# Patient Record
Sex: Male | Born: 1940 | Race: White | Hispanic: No | Marital: Married | Smoking: Former smoker
Health system: Southern US, Community
[De-identification: ages and names within clinical notes are randomized; demographics above are authoritative.]

## PROBLEM LIST (undated history)

## (undated) DIAGNOSIS — R0600 Dyspnea, unspecified: Secondary | ICD-10-CM

## (undated) DIAGNOSIS — R310 Gross hematuria: Secondary | ICD-10-CM

## (undated) DIAGNOSIS — R0609 Other forms of dyspnea: Secondary | ICD-10-CM

## (undated) DIAGNOSIS — F32A Depression, unspecified: Secondary | ICD-10-CM

## (undated) DIAGNOSIS — K2210 Ulcer of esophagus without bleeding: Secondary | ICD-10-CM

## (undated) DIAGNOSIS — F419 Anxiety disorder, unspecified: Secondary | ICD-10-CM

## (undated) DIAGNOSIS — I499 Cardiac arrhythmia, unspecified: Secondary | ICD-10-CM

## (undated) DIAGNOSIS — E538 Deficiency of other specified B group vitamins: Secondary | ICD-10-CM

## (undated) DIAGNOSIS — Z972 Presence of dental prosthetic device (complete) (partial): Secondary | ICD-10-CM

## (undated) DIAGNOSIS — I4891 Unspecified atrial fibrillation: Secondary | ICD-10-CM

## (undated) DIAGNOSIS — C499 Malignant neoplasm of connective and soft tissue, unspecified: Secondary | ICD-10-CM

## (undated) DIAGNOSIS — N529 Male erectile dysfunction, unspecified: Secondary | ICD-10-CM

## (undated) DIAGNOSIS — I10 Essential (primary) hypertension: Secondary | ICD-10-CM

## (undated) DIAGNOSIS — I8500 Esophageal varices without bleeding: Secondary | ICD-10-CM

## (undated) DIAGNOSIS — G4730 Sleep apnea, unspecified: Secondary | ICD-10-CM

## (undated) DIAGNOSIS — M4800 Spinal stenosis, site unspecified: Secondary | ICD-10-CM

## (undated) DIAGNOSIS — I509 Heart failure, unspecified: Secondary | ICD-10-CM

## (undated) DIAGNOSIS — I429 Cardiomyopathy, unspecified: Secondary | ICD-10-CM

## (undated) DIAGNOSIS — F329 Major depressive disorder, single episode, unspecified: Secondary | ICD-10-CM

## (undated) DIAGNOSIS — I8393 Asymptomatic varicose veins of bilateral lower extremities: Secondary | ICD-10-CM

## (undated) DIAGNOSIS — E7800 Pure hypercholesterolemia, unspecified: Secondary | ICD-10-CM

## (undated) DIAGNOSIS — R972 Elevated prostate specific antigen [PSA]: Secondary | ICD-10-CM

## (undated) DIAGNOSIS — F1020 Alcohol dependence, uncomplicated: Secondary | ICD-10-CM

## (undated) DIAGNOSIS — K2980 Duodenitis without bleeding: Secondary | ICD-10-CM

## (undated) HISTORY — PX: TONSILLECTOMY: SUR1361

## (undated) HISTORY — PX: MOHS SURGERY: SUR867

## (undated) HISTORY — PX: TONSILLECTOMY AND ADENOIDECTOMY: SUR1326

## (undated) HISTORY — PX: OTHER SURGICAL HISTORY: SHX169

## (undated) SURGERY — VIDEO BRONCHOSCOPY WITH ENDOBRONCHIAL NAVIGATION
Anesthesia: General | Laterality: Left

---

## 2005-06-21 ENCOUNTER — Ambulatory Visit: Payer: Self-pay | Admitting: Internal Medicine

## 2005-11-09 ENCOUNTER — Ambulatory Visit: Payer: Self-pay | Admitting: Internal Medicine

## 2006-01-18 ENCOUNTER — Ambulatory Visit: Payer: Self-pay | Admitting: Otolaryngology

## 2007-03-14 DIAGNOSIS — L570 Actinic keratosis: Secondary | ICD-10-CM

## 2007-03-14 DIAGNOSIS — C4491 Basal cell carcinoma of skin, unspecified: Secondary | ICD-10-CM

## 2007-03-14 HISTORY — DX: Basal cell carcinoma of skin, unspecified: C44.91

## 2007-03-14 HISTORY — DX: Actinic keratosis: L57.0

## 2007-03-23 ENCOUNTER — Inpatient Hospital Stay: Payer: Self-pay | Admitting: Internal Medicine

## 2007-03-23 ENCOUNTER — Other Ambulatory Visit: Payer: Self-pay

## 2007-03-24 ENCOUNTER — Other Ambulatory Visit: Payer: Self-pay

## 2007-03-26 ENCOUNTER — Other Ambulatory Visit: Payer: Self-pay

## 2007-05-29 ENCOUNTER — Ambulatory Visit: Payer: Self-pay | Admitting: Gastroenterology

## 2007-05-30 ENCOUNTER — Observation Stay: Payer: Self-pay | Admitting: Internal Medicine

## 2007-05-30 ENCOUNTER — Other Ambulatory Visit: Payer: Self-pay

## 2007-06-06 ENCOUNTER — Emergency Department: Payer: Self-pay | Admitting: Emergency Medicine

## 2007-10-29 ENCOUNTER — Inpatient Hospital Stay: Payer: Self-pay | Admitting: Internal Medicine

## 2007-10-29 ENCOUNTER — Other Ambulatory Visit: Payer: Self-pay

## 2007-11-02 ENCOUNTER — Other Ambulatory Visit: Payer: Self-pay

## 2008-01-01 ENCOUNTER — Ambulatory Visit: Payer: Self-pay | Admitting: Gastroenterology

## 2008-03-28 ENCOUNTER — Emergency Department (HOSPITAL_COMMUNITY): Admission: EM | Admit: 2008-03-28 | Discharge: 2008-03-28 | Payer: Self-pay | Admitting: Emergency Medicine

## 2008-03-28 ENCOUNTER — Encounter: Admission: RE | Admit: 2008-03-28 | Discharge: 2008-03-28 | Payer: Self-pay | Admitting: Psychiatry

## 2008-04-07 ENCOUNTER — Encounter: Admission: RE | Admit: 2008-04-07 | Discharge: 2008-04-07 | Payer: Self-pay | Admitting: Psychiatry

## 2008-07-16 ENCOUNTER — Ambulatory Visit: Payer: Self-pay | Admitting: Otolaryngology

## 2008-08-15 ENCOUNTER — Ambulatory Visit: Payer: Self-pay | Admitting: Otolaryngology

## 2009-01-27 IMAGING — CR DG CHEST 1V PORT
1 series · 1 of 1 positions shown · non-contrast
Comparison: none

REASON FOR EXAM: on vent
COMMENTS:

[view not recorded]
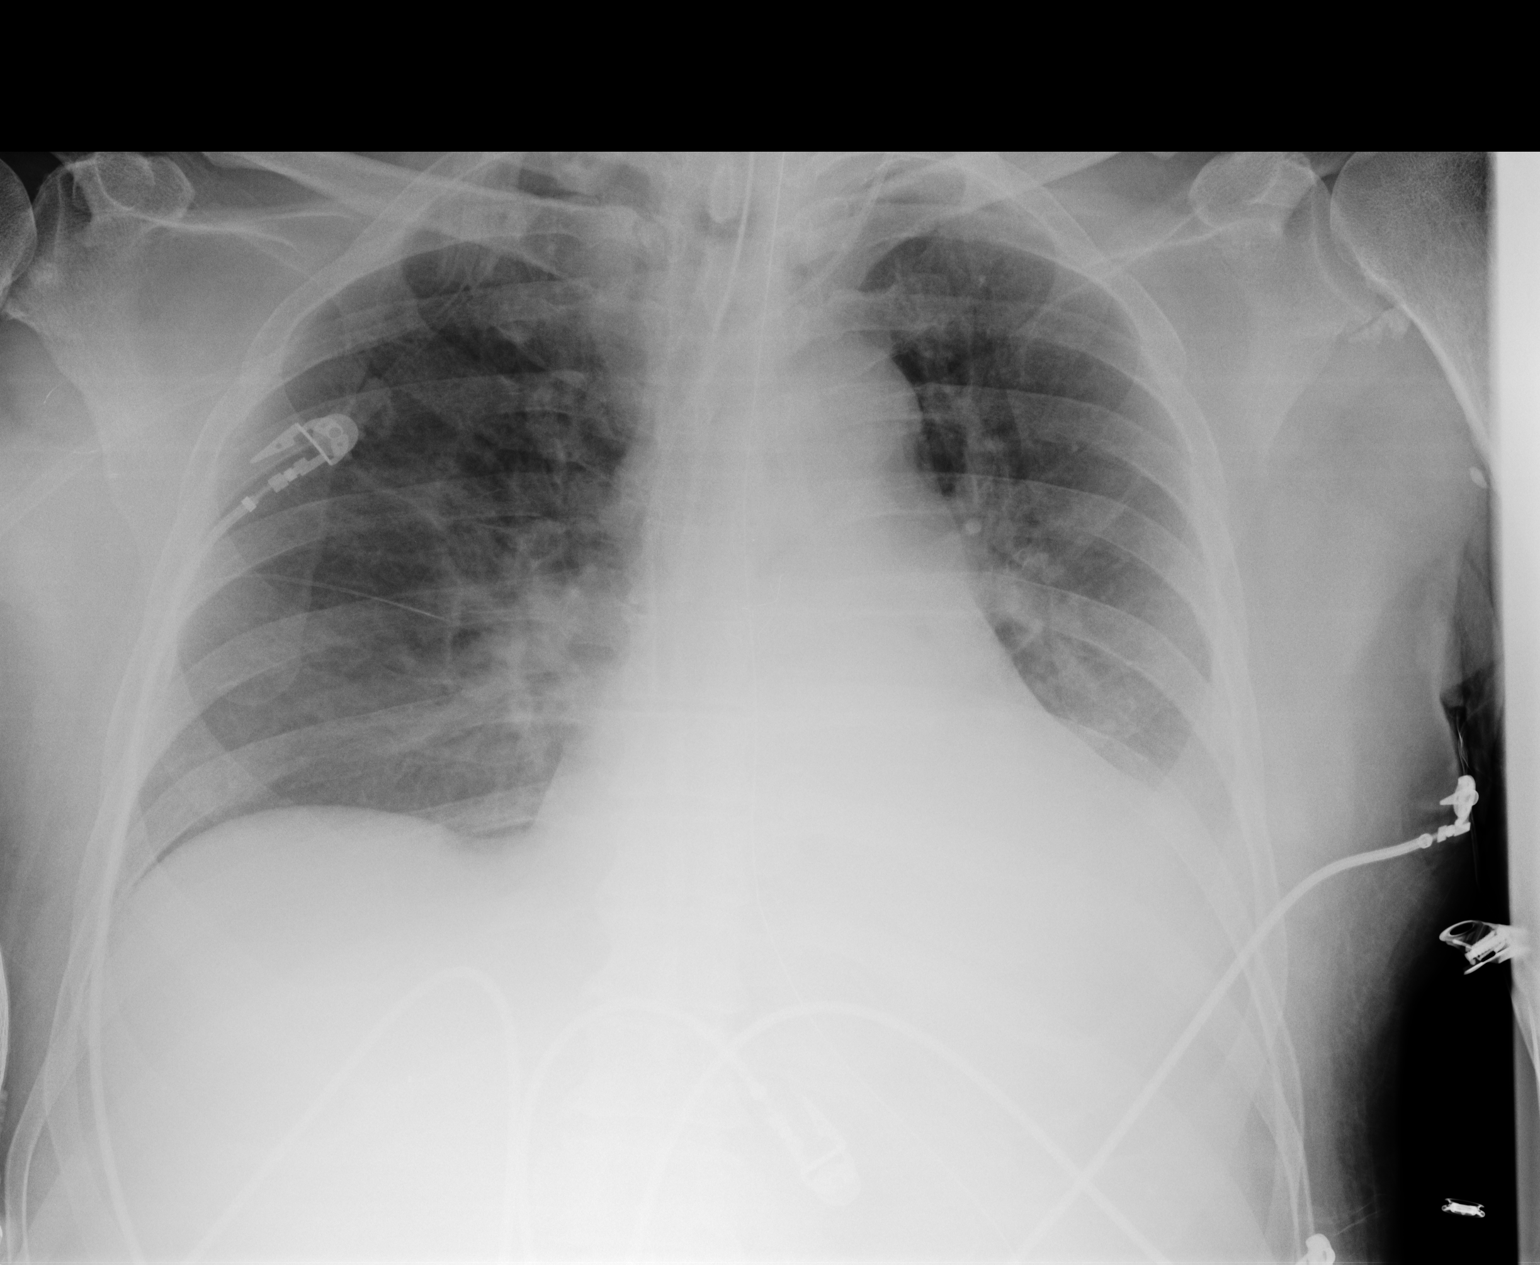

[1 of 1 positions shown; findings below may reference images not displayed]

PROCEDURE:     DXR - DXR PORTABLE CHEST SINGLE VIEW  - November 03, 2007  [DATE]

RESULT:     Comparison is made to a study 02 November, 2007.

The endotracheal tube tip lies at the level of the inferior margin of the
clavicular heads. The LEFT internal jugular venous catheter tip lies in the
region of a portion of the junction of the SVC with the RIGHT atrium. The
LEFT lower lobe region remains dense. The perihilar lung markings remain
prominent.
IMPRESSION: Allowing for the somewhat increased hypoinflation today, there has not been
significant interval change since the study [DATE].

## 2009-01-28 IMAGING — CR DG CHEST 1V PORT
1 series · 1 of 1 positions shown · non-contrast
Comparison: none

REASON FOR EXAM: on vent
COMMENTS:

[view not recorded]
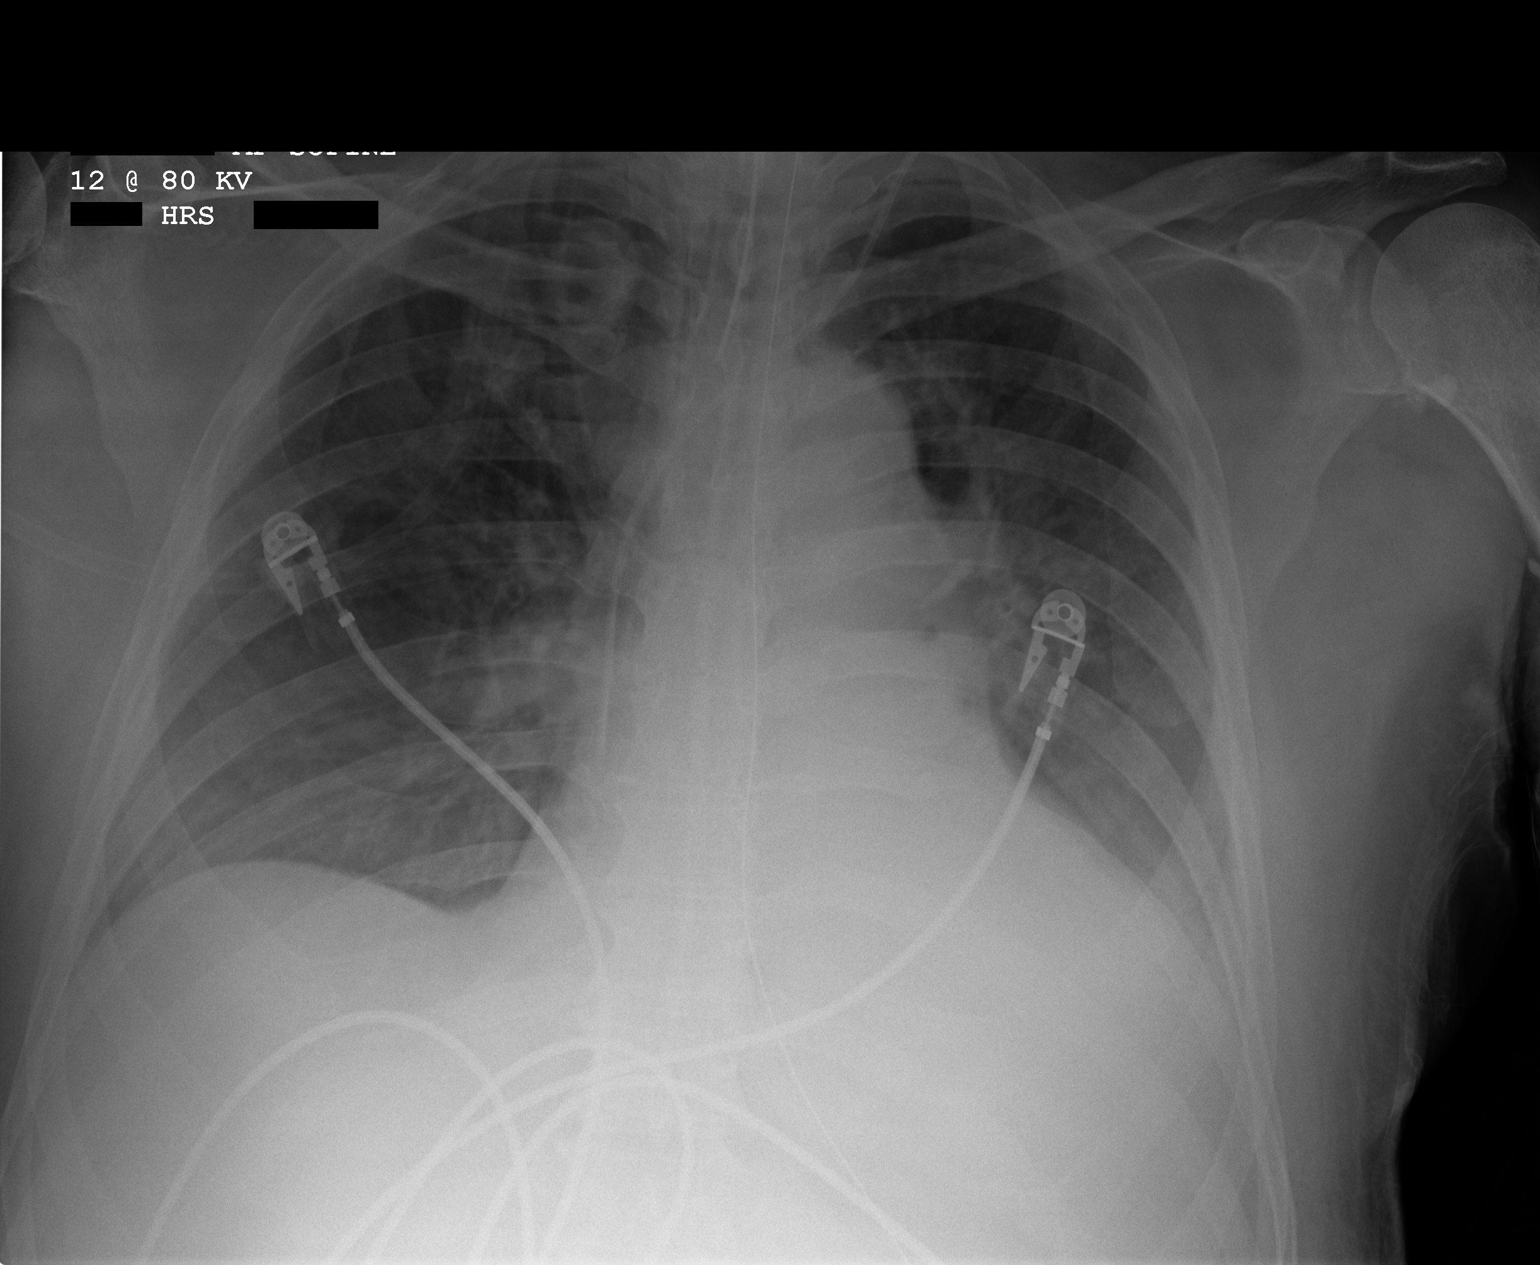

[1 of 1 positions shown; findings below may reference images not displayed]

PROCEDURE:     DXR - DXR PORTABLE CHEST SINGLE VIEW  - November 04, 2007  [DATE]

RESULT:     Comparison is made to a study 03 November, 2007.

The lungs are somewhat better inflated today. The endotracheal tube tip lies
at the level of the clavicular heads. The LEFT internal jugular venous
catheter tip lies in the region of the junction of the SVC with the RIGHT
atrium. The esophagogastric tube tip projects off the film.

The LEFT hemidiaphragm remains obscured. The cardiac silhouette remains top
normal in size. The perihilar lung markings are prominent.
IMPRESSION: There remain findings of LEFT lower lobe atelectasis. There may be an
element of low grade interstitial edema as well. Overall, there has not been
dramatic change since yesterday's study.

## 2009-01-31 IMAGING — CR DG CHEST 1V PORT
1 series · 1 of 1 positions shown · non-contrast
Comparison: none

REASON FOR EXAM: RIGHT INFILTRATE
COMMENTS:

PROCEDURE:     DXR - DXR PORTABLE CHEST SINGLE VIEW  - November 07, 2007  [DATE]
RESULT:     Comparison: 11/05/2007

[view not recorded]
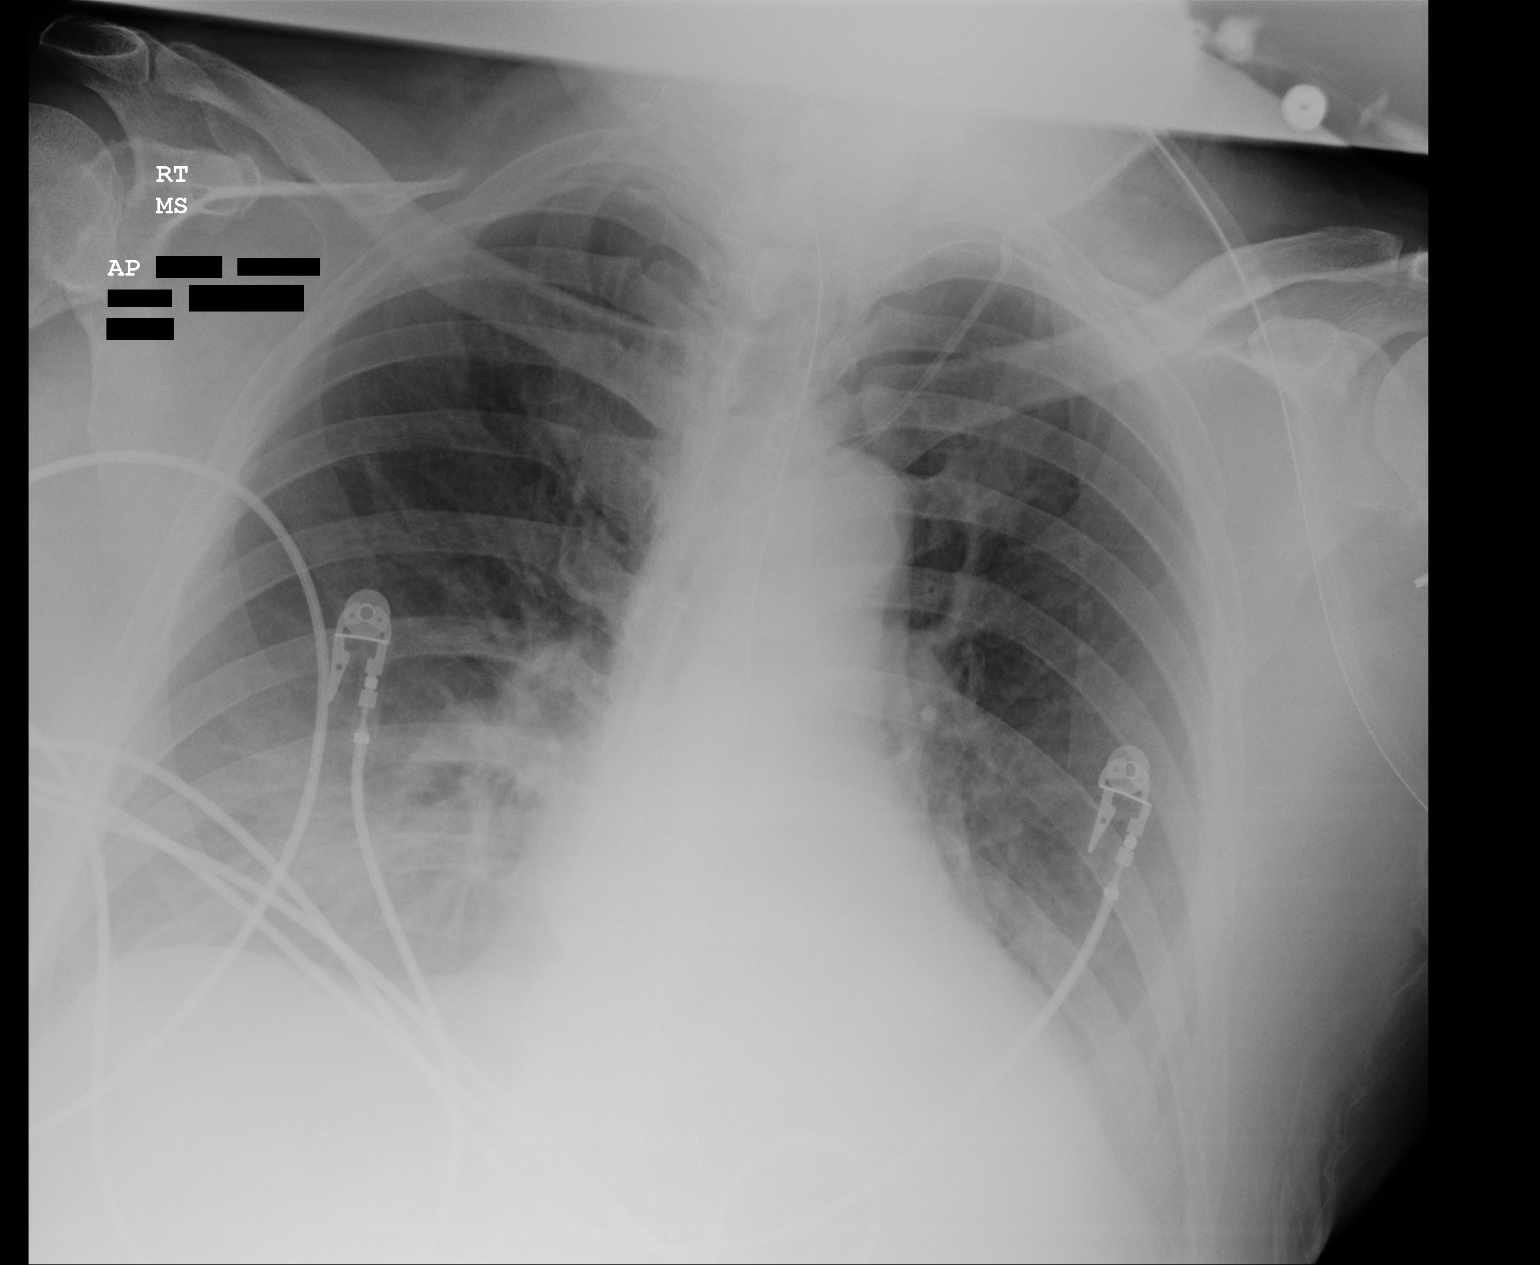

[1 of 1 positions shown; findings below may reference images not displayed]

FINDINGS: Single AP view of the chest is provided. There has been interval extubation.
Again demonstrated is a nasogastric tube with the tip not visualized. There
is a left sided central venous catheter with the tip projecting over the
SVC.

There is no focal consolidation or pneumothorax. There is mild haziness of
the lower lung fields relative to the remainder of the lung which may
reflect a small amount of pleural fluid. The heart and mediastinum are
unremarkable. The osseous structures are unremarkable.
IMPRESSION: No significant interval change compared to prior exam.

## 2009-02-03 IMAGING — CR DG CHEST 1V PORT
1 series · 1 of 1 positions shown · non-contrast
Comparison: none

REASON FOR EXAM: respiratory failure
COMMENTS:

PROCEDURE:     DXR - DXR PORTABLE CHEST SINGLE VIEW  - November 10, 2007  [DATE]
RESULT:     A central venous line is noted with its tip projected over the
superior vena cava, upper RIGHT atrium.  NG tube is noted in good anatomic
position. The lungs are clear. There is cardiomegaly.

[view not recorded]
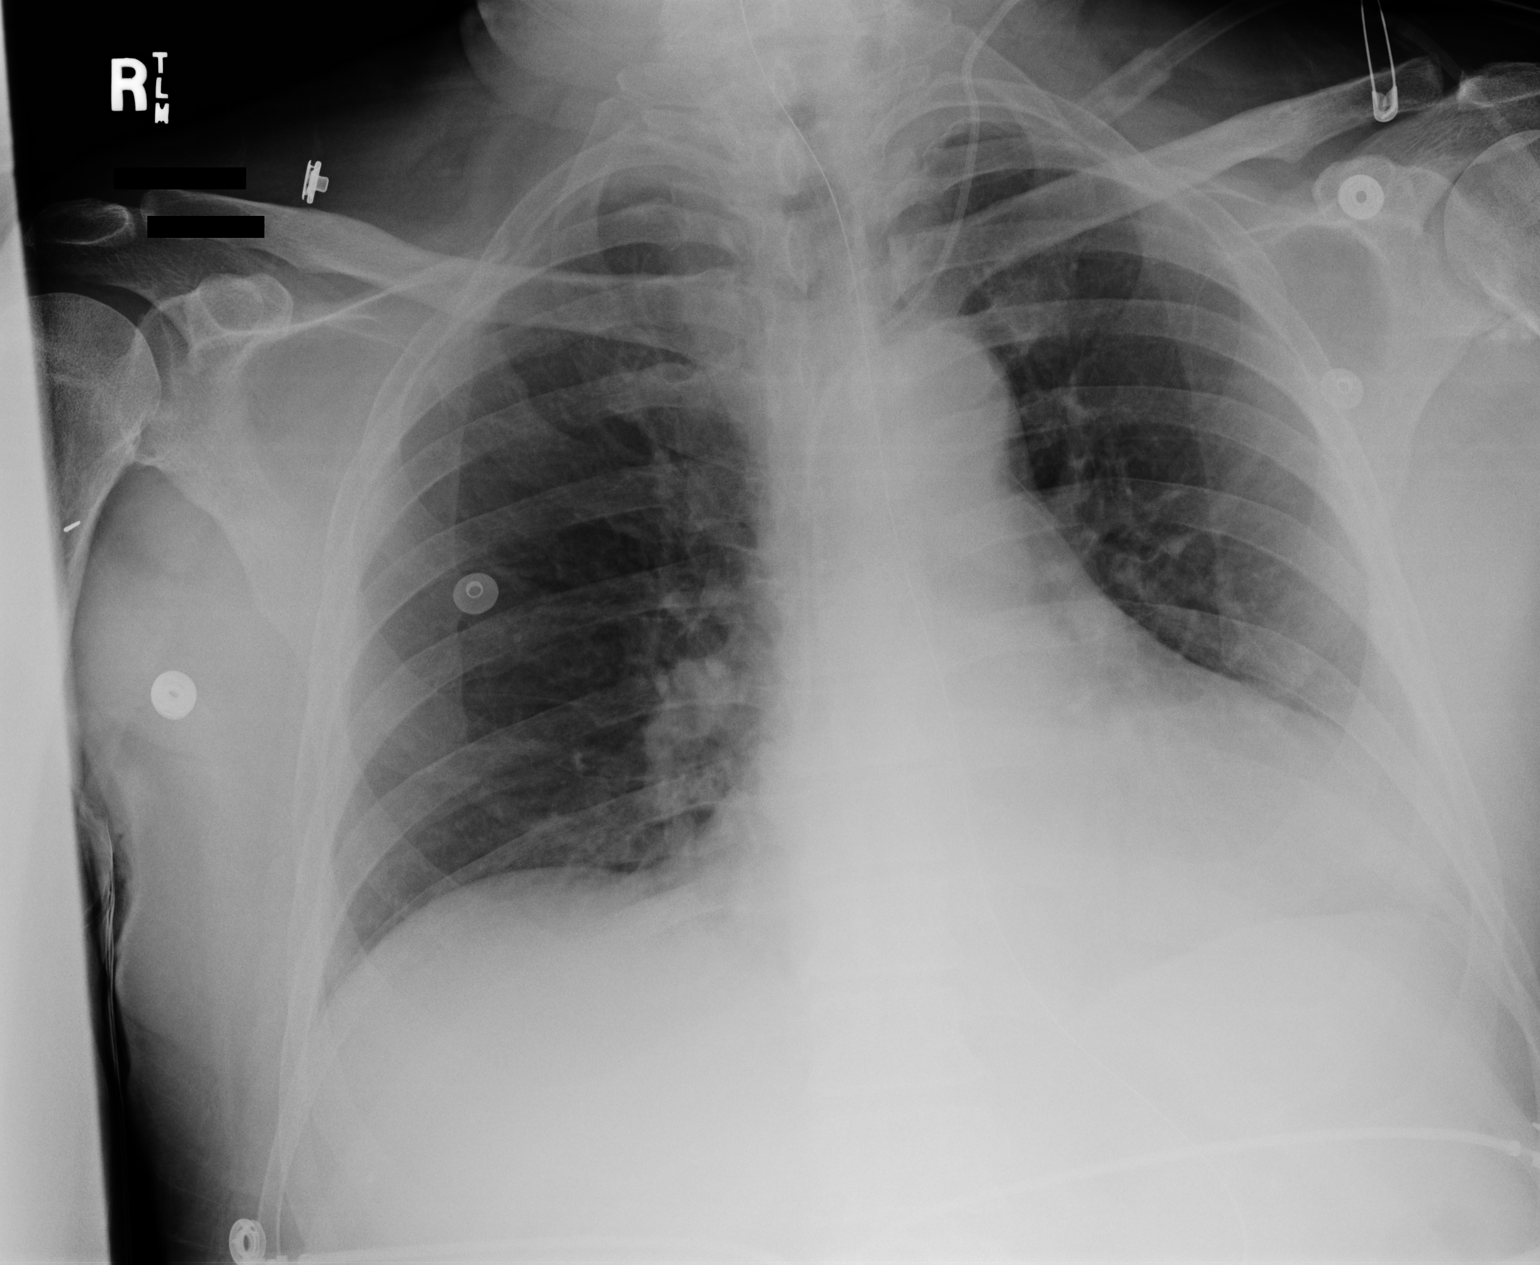

[1 of 1 positions shown; findings below may reference images not displayed]

IMPRESSION: 1.No acute abnormalities are identified. Tube positionings are stable. No
evidence of focal infiltrate or pulmonary edema noted on today's exam.

## 2009-02-04 IMAGING — CR DG CHEST 1V PORT
1 series · 1 of 1 positions shown · non-contrast
Comparison: none

REASON FOR EXAM: RESPIRATORY FAILURE
COMMENTS:

[view not recorded]
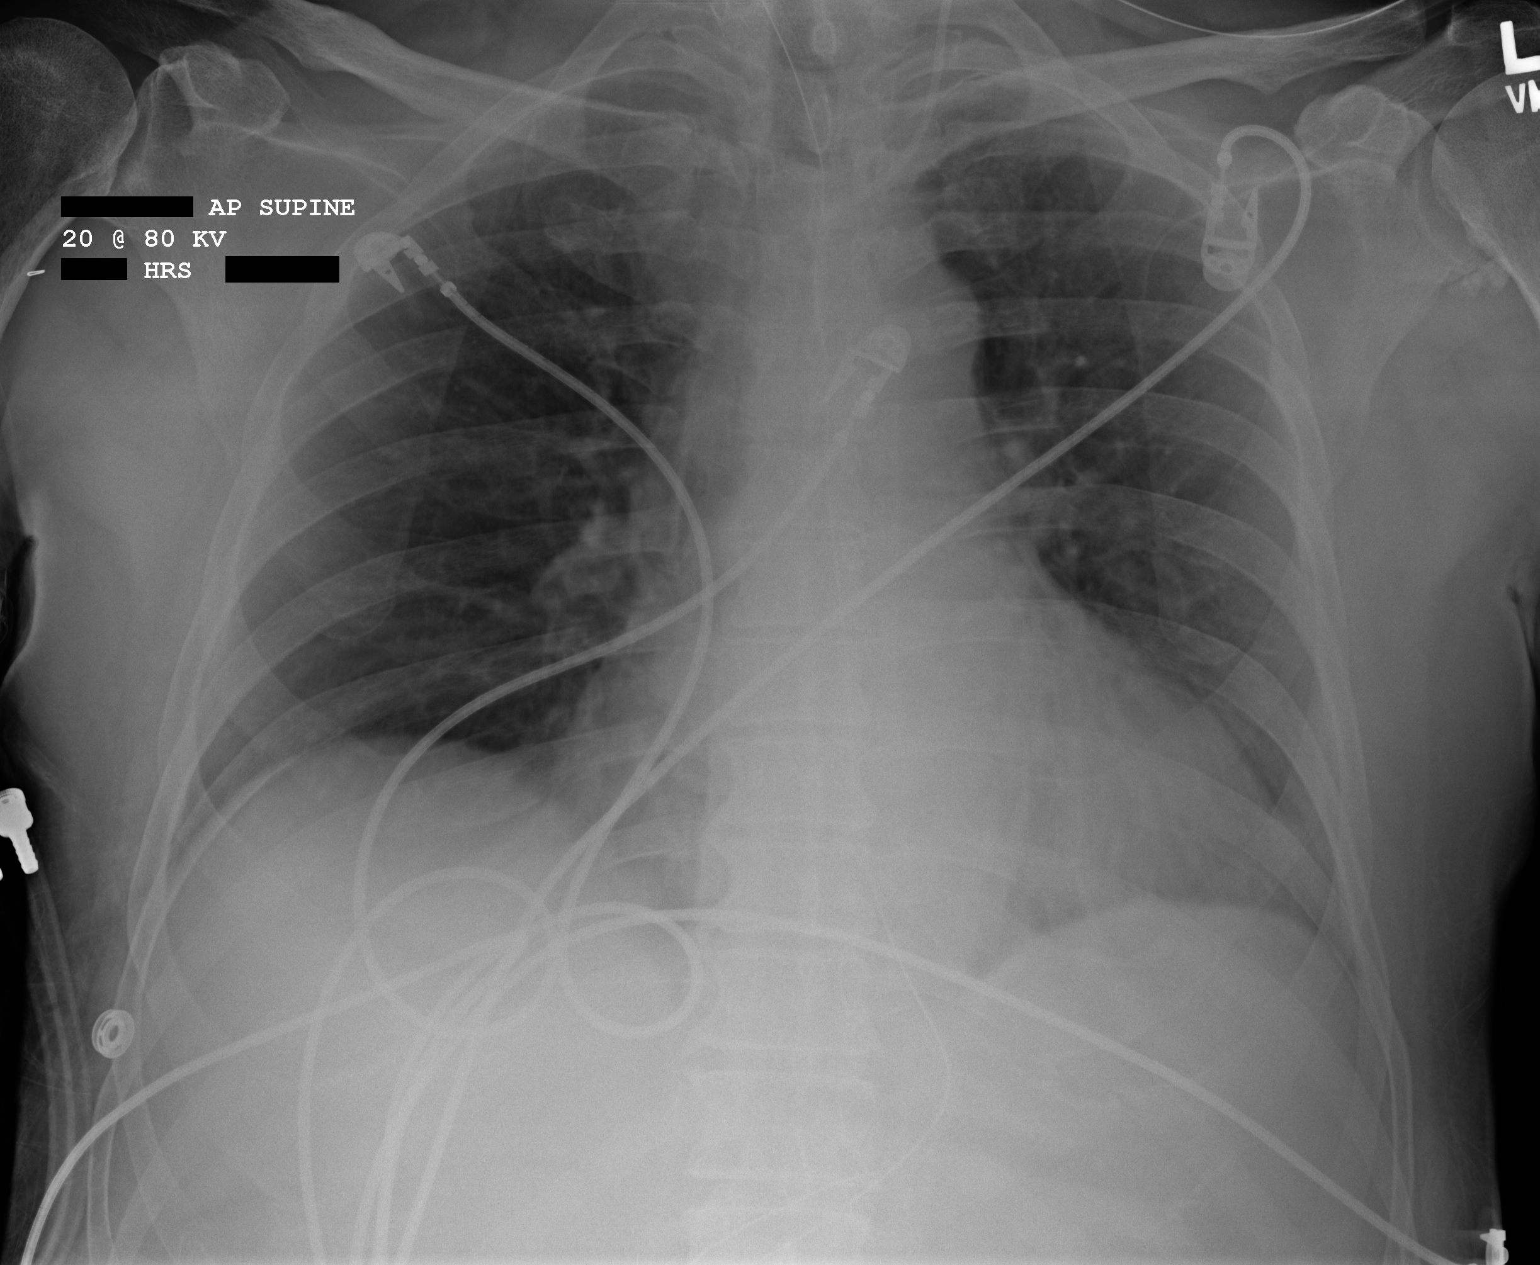

[1 of 1 positions shown; findings below may reference images not displayed]

PROCEDURE:     DXR - DXR PORTABLE CHEST SINGLE VIEW  - November 11, 2007  [DATE]

RESULT:     A central venous line via a LEFT jugular approach is noted with
its tip in the region of the superior vena cava. An NG tube is noted with
its tip coiled in the stomach. The lungs are clear of acute infiltrates. The
cardiovascular structures are stable.
IMPRESSION: 1.No acute cardiopulmonary disease. NG tube and central venous line in good
anatomic position.

## 2010-06-08 LAB — BASIC METABOLIC PANEL
BUN: 21 mg/dL (ref 6–23)
CO2: 26 mEq/L (ref 19–32)
Calcium: 9.6 mg/dL (ref 8.4–10.5)
Creatinine, Ser: 0.99 mg/dL (ref 0.4–1.5)
GFR calc non Af Amer: >60 mL/min (ref >60)
Glucose, Bld: 123 mg/dL — ABNORMAL HIGH (ref 70–99)
Sodium: 137 mEq/L (ref 135–145)

## 2011-03-22 ENCOUNTER — Ambulatory Visit: Payer: Self-pay | Admitting: Sports Medicine

## 2012-01-25 ENCOUNTER — Ambulatory Visit: Payer: Self-pay | Admitting: Gastroenterology

## 2012-02-22 DIAGNOSIS — Z85828 Personal history of other malignant neoplasm of skin: Secondary | ICD-10-CM

## 2012-02-22 HISTORY — DX: Personal history of other malignant neoplasm of skin: Z85.828

## 2012-07-05 DIAGNOSIS — C4492 Squamous cell carcinoma of skin, unspecified: Secondary | ICD-10-CM

## 2012-07-05 HISTORY — DX: Squamous cell carcinoma of skin, unspecified: C44.92

## 2014-01-08 DIAGNOSIS — D239 Other benign neoplasm of skin, unspecified: Secondary | ICD-10-CM

## 2014-01-08 HISTORY — DX: Other benign neoplasm of skin, unspecified: D23.9

## 2015-01-20 DIAGNOSIS — Z86006 Personal history of melanoma in-situ: Secondary | ICD-10-CM

## 2015-01-20 HISTORY — DX: Personal history of melanoma in-situ: Z86.006

## 2016-12-22 HISTORY — PX: OTHER SURGICAL HISTORY: SHX169

## 2016-12-27 ENCOUNTER — Ambulatory Visit: Payer: Medicare Other | Admitting: Anesthesiology

## 2016-12-27 ENCOUNTER — Ambulatory Visit
Admission: RE | Admit: 2016-12-27 | Discharge: 2016-12-27 | Disposition: A | Discharge status: Home / Self Care | Payer: Medicare Other | Source: Ambulatory Visit | Attending: Internal Medicine | Admitting: Internal Medicine

## 2016-12-27 ENCOUNTER — Encounter
Admission: RE | Disposition: A | Discharge status: Home / Self Care | Payer: Self-pay | Source: Ambulatory Visit | Attending: Internal Medicine

## 2016-12-27 DIAGNOSIS — Z79899 Other long term (current) drug therapy: Secondary | ICD-10-CM | POA: Insufficient documentation

## 2016-12-27 DIAGNOSIS — K640 First degree hemorrhoids: Secondary | ICD-10-CM | POA: Diagnosis not present

## 2016-12-27 DIAGNOSIS — Z1211 Encounter for screening for malignant neoplasm of colon: Secondary | ICD-10-CM | POA: Diagnosis present

## 2016-12-27 DIAGNOSIS — Z91040 Latex allergy status: Secondary | ICD-10-CM | POA: Diagnosis not present

## 2016-12-27 DIAGNOSIS — Z7982 Long term (current) use of aspirin: Secondary | ICD-10-CM | POA: Insufficient documentation

## 2016-12-27 DIAGNOSIS — Z8371 Family history of colonic polyps: Secondary | ICD-10-CM | POA: Diagnosis not present

## 2016-12-27 HISTORY — PX: COLONOSCOPY WITH PROPOFOL: SHX5780

## 2016-12-27 SURGERY — COLONOSCOPY WITH PROPOFOL
Anesthesia: General

## 2016-12-27 MED ORDER — LIDOCAINE 2% (20 MG/ML) 5 ML SYRINGE
INTRAMUSCULAR | Status: DC | PRN
  Administered 2016-12-27: 40 mg via INTRAVENOUS

## 2016-12-27 MED ORDER — PROPOFOL 10 MG/ML IV BOLUS
INTRAVENOUS | Status: AC
  Filled 2016-12-27: qty 20

## 2016-12-27 MED ORDER — PROPOFOL 10 MG/ML IV BOLUS
INTRAVENOUS | Status: DC | PRN
  Administered 2016-12-27: 100 mg via INTRAVENOUS

## 2016-12-27 MED ORDER — FENTANYL CITRATE (PF) 100 MCG/2ML IJ SOLN
INTRAMUSCULAR | Status: AC
  Filled 2016-12-27: qty 2

## 2016-12-27 MED ORDER — PROPOFOL 500 MG/50ML IV EMUL
INTRAVENOUS | Status: DC | PRN
  Administered 2016-12-27: 140 ug/kg/min via INTRAVENOUS

## 2016-12-27 MED ORDER — SODIUM CHLORIDE 0.9 % IV SOLN
INTRAVENOUS | Status: DC
  Administered 2016-12-27 (×2): via INTRAVENOUS

## 2016-12-27 MED ORDER — PROPOFOL 500 MG/50ML IV EMUL
INTRAVENOUS | Status: AC
  Filled 2016-12-27: qty 50

## 2016-12-27 MED ORDER — PHENYLEPHRINE HCL 10 MG/ML IJ SOLN
INTRAMUSCULAR | Status: DC | PRN
  Administered 2016-12-27 (×2): 100 ug via INTRAVENOUS

## 2016-12-27 MED ORDER — FENTANYL CITRATE (PF) 100 MCG/2ML IJ SOLN
INTRAMUSCULAR | Status: DC | PRN
  Administered 2016-12-27 (×2): 50 ug via INTRAVENOUS

## 2016-12-27 NOTE — Anesthesia Postprocedure Evaluation (Signed)
Anesthesia Post Note  Patient: Wesley Young  Procedure(s) Performed: COLONOSCOPY WITH PROPOFOL (N/A )  Patient location during evaluation: Endoscopy Anesthesia Type: General Level of consciousness: awake and alert Pain management: pain level controlled Vital Signs Assessment: post-procedure vital signs reviewed and stable Respiratory status: spontaneous breathing, nonlabored ventilation, respiratory function stable and patient connected to nasal cannula oxygen Cardiovascular status: blood pressure returned to baseline and stable Postop Assessment: no apparent nausea or vomiting Anesthetic complications: no     Last Vitals:  Vitals:   12/27/16 1609 12/27/16 1629  BP: (!) 84/48 118/76  Pulse: 60 72  Resp: 15 18  Temp:    SpO2: 99% 99%    Last Pain:  Vitals:   12/27/16 1445  TempSrc: Tympanic                 Martha Clan

## 2016-12-27 NOTE — Transfer of Care (Signed)
Immediate Anesthesia Transfer of Care Note  Patient: Wesley Young  Procedure(s) Performed: COLONOSCOPY WITH PROPOFOL (N/A )  Patient Location: PACU and Endoscopy Unit  Anesthesia Type:General  Level of Consciousness: sedated  Airway & Oxygen Therapy: Patient Spontanous Breathing and Patient connected to nasal cannula oxygen  Post-op Assessment: Report given to RN and Post -op Vital signs reviewed and stable  Post vital signs: Reviewed and stable  Last Vitals:  Vitals:   12/27/16 1445  BP: (!) 160/91  Pulse: 77  Resp: 18  Temp: 36.8 C  SpO2: 100%    Last Pain:  Vitals:   12/27/16 1445  TempSrc: Tympanic         Complications: No apparent anesthesia complications

## 2016-12-27 NOTE — Anesthesia Post-op Follow-up Note (Signed)
Anesthesia QCDR form completed.        

## 2016-12-27 NOTE — Interval H&P Note (Signed)
History and Physical Interval Note:  12/27/2016 3:27 PM  Wesley Young  has presented today for surgery, with the diagnosis of FM HX POLYPS IN COLON  The various methods of treatment have been discussed with the patient and family. After consideration of risks, benefits and other options for treatment, the patient has consented to  Procedure(s): COLONOSCOPY WITH PROPOFOL (N/A) as a surgical intervention .  The patient's history has been reviewed, patient examined, no change in status, stable for surgery.  I have reviewed the patient's chart and labs.  Questions were answered to the patient's satisfaction.     [place], [place]

## 2016-12-27 NOTE — Op Note (Signed)
[facility] Gastroenterology Patient Name: Wesley Young Procedure Date: 12/27/2016 3:28 PM MRN: [id number] Account #: [id number] Date of Birth: [birth year] Admit Type: Outpatient Age: 76 Room: [facility] ENDO ROOM 1 Gender: Male Note Status: Finalized Procedure:            Colonoscopy Indications:          Colon cancer screening in patient at increased risk:                        Family history of 1st-degree relative with colon polyps Providers:            Benay Pike. [name] MD, MD Referring MD:         Ocie Cornfield. Ouida Sills MD, MD (Referring MD) Medicines:            Propofol per Anesthesia Complications:        No immediate complications. Procedure:            Pre-Anesthesia Assessment:                       - The risks and benefits of the procedure and the                        sedation options and risks were discussed with the                        patient. All questions were answered and informed                        consent was obtained.                       - Patient identification and proposed procedure were                        verified prior to the procedure by the nurse. The                        procedure was verified in the procedure room.                       - ASA Grade Assessment: III - A patient with severe                        systemic disease.                       - After reviewing the risks and benefits, the patient                        was deemed in satisfactory condition to undergo the                        procedure.                       After obtaining informed consent, the colonoscope was                        passed under direct vision. Throughout the procedure,  the patient's blood pressure, pulse, and oxygen                        saturations were monitored continuously. The                        Colonoscope was introduced through the anus and                        advanced to the the cecum, identified  by appendiceal                        orifice and ileocecal valve. The colonoscopy was                        performed without difficulty. The patient tolerated the                        procedure well. The quality of the bowel preparation                        was fair. The ileocecal valve, appendiceal orifice, and                        rectum were photographed. Findings:      The perianal and digital rectal examinations were normal. Pertinent       negatives include normal sphincter tone and no palpable rectal lesions.      Non-bleeding internal hemorrhoids were found during retroflexion. The       hemorrhoids were Grade I (internal hemorrhoids that do not prolapse).      The exam was otherwise without abnormality. Impression:           - Preparation of the colon was fair.                       - Non-bleeding internal hemorrhoids.                       - The examination was otherwise normal.                       - No specimens collected. Recommendation:       - Patient has a contact number available for                        emergencies. The signs and symptoms of potential                        delayed complications were discussed with the patient.                        Return to normal activities tomorrow. Written discharge                        instructions were provided to the patient.                       - Resume previous diet.                       - Continue present medications.                       -  Miralax 1 capful (17 grams) in 8 ounces of water PO                        daily daily.                       - No repeat colonoscopy due to age                       - The findings and recommendations were discussed with                        the patient and their spouse. Procedure Code(s):    --- Professional ---                       Z1696, Colorectal cancer screening; colonoscopy on                        individual at high risk Diagnosis Code(s):    ---  Professional ---                       Z83.71, Family history of colonic polyps                       K64.0, First degree hemorrhoids CPT copyright 2016 American Medical Association. All rights reserved. The codes documented in this report are preliminary and upon coder review may  be revised to meet current compliance requirements. Efrain Sella MD, MD 12/27/2016 3:55:48 PM This report has been signed electronically. Number of Addenda: 0 Note Initiated On: 12/27/2016 3:28 PM Scope Withdrawal Time: 0 hours 9 minutes 49 seconds  Total Procedure Duration: 0 hours 18 minutes 11 seconds       [facility]

## 2016-12-27 NOTE — Anesthesia Preprocedure Evaluation (Signed)
Anesthesia Evaluation  Patient identified by MRN, date of birth, ID band Patient awake    Reviewed: Allergy & Precautions, NPO status , Patient's Chart, lab work & pertinent test results  Airway Mallampati: II       Dental  (+) Teeth Intact   Pulmonary neg pulmonary ROS,    breath sounds clear to auscultation       Cardiovascular Exercise Tolerance: Good  Rhythm:Regular Rate:Normal     Neuro/Psych    GI/Hepatic negative GI ROS, Neg liver ROS,   Endo/Other  negative endocrine ROS  Renal/GU negative Renal ROS     Musculoskeletal   Abdominal Normal abdominal exam  (+)   Peds negative pediatric ROS (+)  Hematology negative hematology ROS (+)   Anesthesia Other Findings   Reproductive/Obstetrics                             Anesthesia Physical Anesthesia Plan  ASA: II  Anesthesia Plan: General   Post-op Pain Management:    Induction:   PONV Risk Score and Plan: 2  Airway Management Planned: Natural Airway and Nasal Cannula  Additional Equipment:   Intra-op Plan:   Post-operative Plan:   Informed Consent: I have reviewed the patients History and Physical, chart, labs and discussed the procedure including the risks, benefits and alternatives for the proposed anesthesia with the patient or authorized representative who has indicated his/her understanding and acceptance.     Plan Discussed with: CRNA  Anesthesia Plan Comments:         Anesthesia Quick Evaluation

## 2016-12-27 NOTE — H&P (Signed)
Outpatient short stay form Pre-procedure 12/27/2016 3:25 PM Wesley Young K. [name], M.D.  Primary Physician: Dr. Frazier Richards  Reason for visit:  Family hx of colon polyps, altered bowel habits.  History of present illness:  76 y/o male presents for FH colon polyps. Also has hx of altered bowel habits controlled with one half capful of Miralax daily. Denies weight loss, rectal bleeding.    Current Facility-Administered Medications:  .  0.9 %  sodium chloride infusion, , Intravenous, Continuous, [place], Benay Pike, MD, Last Rate: 20 mL/hr at 12/27/16 1520  Medications Prior to Admission  Medication Sig Dispense Refill Last Dose  . aspirin EC 81 MG tablet Take 81 mg daily by mouth.   12/26/2016 at Unknown time  . enalapril (VASOTEC) 5 MG tablet Take 5 mg daily by mouth.   12/26/2016 at Unknown time  . finasteride (PROSCAR) 5 MG tablet Take 5 mg daily by mouth.   12/26/2016 at Unknown time  . pramipexole (MIRAPEX) 0.5 MG tablet Take 0.5 mg 3 (three) times daily by mouth.   12/26/2016 at Unknown time  . Probiotic Product (ALIGN PO) Take daily by mouth.   12/26/2016 at Unknown time  . traMADol (ULTRAM) 50 MG tablet Take 50 mg every 6 (six) hours as needed by mouth.   12/27/2016 at Unknown time     Allergies  Allergen Reactions  . Statins Other (See Comments)  . Latex Rash  . Neosporin [Neomycin-Bacitracin Zn-Polymyx] Rash     No past medical history on file.  Review of systems:      Physical Exam  General appearance: alert, cooperative and appears stated age Resp: clear to auscultation bilaterally Cardio: regular rate and rhythm, S1, S2 normal, no murmur, click, rub or gallop GI: soft, non-tender; bowel sounds normal; no masses,  no organomegaly Extremities: extremities normal, atraumatic, no cyanosis or edema     Planned procedures: Colonoscopy. The patient understands the nature of the planned procedure, indications, risks, alternatives and potential complications including but  not limited to bleeding, infection, perforation, damage to internal organs and possible oversedation/side effects from anesthesia. The patient agrees and gives consent to proceed.  Please refer to procedure notes for findings, recommendations and patient disposition/instructions.    Wesley Young K. [name], M.D. Gastroenterology 12/27/2016  3:25 PM

## 2016-12-28 ENCOUNTER — Encounter: Payer: Self-pay | Admitting: Internal Medicine

## 2017-08-30 ENCOUNTER — Other Ambulatory Visit: Payer: Self-pay | Admitting: Gastroenterology

## 2017-08-30 DIAGNOSIS — R1310 Dysphagia, unspecified: Secondary | ICD-10-CM

## 2017-09-05 ENCOUNTER — Ambulatory Visit
Admission: RE | Admit: 2017-09-05 | Discharge: 2017-09-05 | Disposition: A | Discharge status: Home / Self Care | Payer: Medicare Other | Source: Ambulatory Visit | Attending: Gastroenterology | Admitting: Gastroenterology

## 2017-09-05 DIAGNOSIS — R1310 Dysphagia, unspecified: Secondary | ICD-10-CM | POA: Diagnosis not present

## 2017-09-05 DIAGNOSIS — I998 Other disorder of circulatory system: Secondary | ICD-10-CM | POA: Insufficient documentation

## 2017-09-05 DIAGNOSIS — K449 Diaphragmatic hernia without obstruction or gangrene: Secondary | ICD-10-CM | POA: Diagnosis not present

## 2017-09-20 ENCOUNTER — Encounter: Payer: Self-pay | Admitting: *Deleted

## 2017-09-21 ENCOUNTER — Ambulatory Visit
Admission: RE | Admit: 2017-09-21 | Discharge: 2017-09-21 | Disposition: A | Discharge status: Home / Self Care | Payer: Medicare Other | Source: Ambulatory Visit | Attending: Gastroenterology | Admitting: Gastroenterology

## 2017-09-21 ENCOUNTER — Encounter: Payer: Self-pay | Admitting: Gastroenterology

## 2017-09-21 ENCOUNTER — Encounter
Admission: RE | Disposition: A | Discharge status: Home / Self Care | Payer: Self-pay | Source: Ambulatory Visit | Attending: Gastroenterology

## 2017-09-21 ENCOUNTER — Ambulatory Visit: Payer: Medicare Other | Admitting: Anesthesiology

## 2017-09-21 DIAGNOSIS — F329 Major depressive disorder, single episode, unspecified: Secondary | ICD-10-CM | POA: Insufficient documentation

## 2017-09-21 DIAGNOSIS — E538 Deficiency of other specified B group vitamins: Secondary | ICD-10-CM | POA: Insufficient documentation

## 2017-09-21 DIAGNOSIS — Z888 Allergy status to other drugs, medicaments and biological substances status: Secondary | ICD-10-CM | POA: Insufficient documentation

## 2017-09-21 DIAGNOSIS — Z7982 Long term (current) use of aspirin: Secondary | ICD-10-CM | POA: Diagnosis not present

## 2017-09-21 DIAGNOSIS — Z87891 Personal history of nicotine dependence: Secondary | ICD-10-CM | POA: Insufficient documentation

## 2017-09-21 DIAGNOSIS — K210 Gastro-esophageal reflux disease with esophagitis: Secondary | ICD-10-CM | POA: Diagnosis not present

## 2017-09-21 DIAGNOSIS — I509 Heart failure, unspecified: Secondary | ICD-10-CM | POA: Diagnosis not present

## 2017-09-21 DIAGNOSIS — Z91040 Latex allergy status: Secondary | ICD-10-CM | POA: Diagnosis not present

## 2017-09-21 DIAGNOSIS — R1310 Dysphagia, unspecified: Secondary | ICD-10-CM | POA: Insufficient documentation

## 2017-09-21 DIAGNOSIS — Z886 Allergy status to analgesic agent status: Secondary | ICD-10-CM | POA: Insufficient documentation

## 2017-09-21 DIAGNOSIS — I110 Hypertensive heart disease with heart failure: Secondary | ICD-10-CM | POA: Diagnosis not present

## 2017-09-21 DIAGNOSIS — I429 Cardiomyopathy, unspecified: Secondary | ICD-10-CM | POA: Insufficient documentation

## 2017-09-21 DIAGNOSIS — Z8711 Personal history of peptic ulcer disease: Secondary | ICD-10-CM | POA: Diagnosis not present

## 2017-09-21 DIAGNOSIS — F419 Anxiety disorder, unspecified: Secondary | ICD-10-CM | POA: Insufficient documentation

## 2017-09-21 DIAGNOSIS — K2980 Duodenitis without bleeding: Secondary | ICD-10-CM | POA: Diagnosis not present

## 2017-09-21 DIAGNOSIS — G4730 Sleep apnea, unspecified: Secondary | ICD-10-CM | POA: Diagnosis not present

## 2017-09-21 DIAGNOSIS — Z79899 Other long term (current) drug therapy: Secondary | ICD-10-CM | POA: Diagnosis not present

## 2017-09-21 DIAGNOSIS — K3189 Other diseases of stomach and duodenum: Secondary | ICD-10-CM | POA: Diagnosis not present

## 2017-09-21 DIAGNOSIS — K2950 Unspecified chronic gastritis without bleeding: Secondary | ICD-10-CM | POA: Diagnosis not present

## 2017-09-21 HISTORY — DX: Essential (primary) hypertension: I10

## 2017-09-21 HISTORY — DX: Dyspnea, unspecified: R06.00

## 2017-09-21 HISTORY — DX: Duodenitis without bleeding: K29.80

## 2017-09-21 HISTORY — PX: ESOPHAGOGASTRODUODENOSCOPY (EGD) WITH PROPOFOL: SHX5813

## 2017-09-21 HISTORY — DX: Cardiac arrhythmia, unspecified: I49.9

## 2017-09-21 HISTORY — DX: Ulcer of esophagus without bleeding: K22.10

## 2017-09-21 HISTORY — DX: Malignant neoplasm of connective and soft tissue, unspecified: C49.9

## 2017-09-21 HISTORY — DX: Cardiomyopathy, unspecified: I42.9

## 2017-09-21 HISTORY — DX: Gross hematuria: R31.0

## 2017-09-21 HISTORY — DX: Spinal stenosis, site unspecified: M48.00

## 2017-09-21 HISTORY — DX: Deficiency of other specified B group vitamins: E53.8

## 2017-09-21 HISTORY — DX: Alcohol dependence, uncomplicated: F10.20

## 2017-09-21 HISTORY — DX: Esophageal varices without bleeding: I85.00

## 2017-09-21 HISTORY — DX: Heart failure, unspecified: I50.9

## 2017-09-21 HISTORY — DX: Major depressive disorder, single episode, unspecified: F32.9

## 2017-09-21 HISTORY — DX: Elevated prostate specific antigen (PSA): R97.20

## 2017-09-21 HISTORY — DX: Sleep apnea, unspecified: G47.30

## 2017-09-21 HISTORY — DX: Anxiety disorder, unspecified: F41.9

## 2017-09-21 HISTORY — DX: Depression, unspecified: F32.A

## 2017-09-21 SURGERY — ESOPHAGOGASTRODUODENOSCOPY (EGD) WITH PROPOFOL
Anesthesia: General

## 2017-09-21 MED ORDER — FENTANYL CITRATE (PF) 100 MCG/2ML IJ SOLN
INTRAMUSCULAR | Status: DC | PRN
  Administered 2017-09-21: 50 ug via INTRAVENOUS

## 2017-09-21 MED ORDER — PROPOFOL 500 MG/50ML IV EMUL
INTRAVENOUS | Status: DC | PRN
  Administered 2017-09-21: 160 ug/kg/min via INTRAVENOUS

## 2017-09-21 MED ORDER — LIDOCAINE 2% (20 MG/ML) 5 ML SYRINGE
INTRAMUSCULAR | Status: DC | PRN
  Administered 2017-09-21: 30 mg via INTRAVENOUS

## 2017-09-21 MED ORDER — FENTANYL CITRATE (PF) 100 MCG/2ML IJ SOLN
INTRAMUSCULAR | Status: AC
  Filled 2017-09-21: qty 2

## 2017-09-21 MED ORDER — SODIUM CHLORIDE 0.9 % IV SOLN
INTRAVENOUS | Status: DC
  Administered 2017-09-21: 1000 mL via INTRAVENOUS

## 2017-09-21 MED ORDER — PROPOFOL 10 MG/ML IV BOLUS
INTRAVENOUS | Status: DC | PRN
  Administered 2017-09-21: 100 mg via INTRAVENOUS

## 2017-09-21 MED ORDER — LIDOCAINE HCL (PF) 1 % IJ SOLN
INTRAMUSCULAR | Status: AC
  Administered 2017-09-21: 0.3 mL via INTRADERMAL
  Filled 2017-09-21: qty 2

## 2017-09-21 MED ORDER — LIDOCAINE HCL (PF) 1 % IJ SOLN
2.0000 mL | Freq: Once | INTRAMUSCULAR | Status: AC
  Administered 2017-09-21: 0.3 mL via INTRADERMAL

## 2017-09-21 MED ORDER — GLYCOPYRROLATE 0.2 MG/ML IJ SOLN
INTRAMUSCULAR | Status: DC | PRN
  Administered 2017-09-21: 0.2 mg via INTRAVENOUS

## 2017-09-21 MED ORDER — EPHEDRINE SULFATE 50 MG/ML IJ SOLN
INTRAMUSCULAR | Status: DC | PRN
  Administered 2017-09-21: 10 mg via INTRAVENOUS

## 2017-09-21 MED ORDER — PROPOFOL 500 MG/50ML IV EMUL
INTRAVENOUS | Status: AC
  Filled 2017-09-21: qty 50

## 2017-09-21 NOTE — Anesthesia Post-op Follow-up Note (Signed)
Anesthesia QCDR form completed.        

## 2017-09-21 NOTE — Transfer of Care (Signed)
Immediate Anesthesia Transfer of Care Note  Patient: Wesley Young  Procedure(s) Performed: ESOPHAGOGASTRODUODENOSCOPY (EGD) WITH PROPOFOL (N/A )  Patient Location: PACU and Endoscopy Unit  Anesthesia Type:General  Level of Consciousness: sedated  Airway & Oxygen Therapy: Patient Spontanous Breathing and Patient connected to nasal cannula oxygen  Post-op Assessment: Report given to RN and Post -op Vital signs reviewed and stable  Post vital signs: Reviewed and stable  Last Vitals:  Vitals Value Taken Time  BP 82/59 09/21/2017 11:00 AM  Temp 36.2 C 09/21/2017 10:58 AM  Pulse 70 09/21/2017 11:01 AM  Resp 16 09/21/2017 11:01 AM  SpO2 100 % 09/21/2017 11:01 AM  Vitals shown include unvalidated device data.  Last Pain:  Vitals:   09/21/17 1058  TempSrc: Tympanic  PainSc: 0-No pain         Complications: No apparent anesthesia complications

## 2017-09-21 NOTE — H&P (Signed)
Outpatient short stay form Pre-procedure 09/21/2017 10:19 AM Wesley Sails MD  Primary Physician: Dr. Frazier Richards  Reason for visit: EGD  History of present illness: Patient is a 77 year old male presenting today as above.  He has a complaint of some intermittent dysphagia recently.  He has not been regurgitating foods although things at times are difficult to go down.  Did have a barium swallow that indicated anterior cervical osteophytes impressing the posterior esophagus as well as a prominent cricopharyngeus muscle.  Further he is noted to have a prominent aortic arch as well as presbyesophagus with multiple tertiary contractions.  There is no actual stenosis or stricture and a standard barium tablet passed normally.  We discussed these changes currently esophagus with age I have recommended that he more deliberately, alternate liquids and solids, and cut foods up if he is unable to chew them well.  Does take a daily 81 mg aspirin.  In 2009 he was noted to have some very early varices.  At that time he was drinking alcohol.  He has been abstinent since that time.  His last EGD done 01/25/2012 showed no evidence of varices at that time.  He takes no other aspirin products or blood thinning agent.    Current Facility-Administered Medications:  .  0.9 %  sodium chloride infusion, , Intravenous, Continuous, Wesley Sails, MD, Last Rate: 20 mL/hr at 09/21/17 1005, 1,000 mL at 09/21/17 1005  Medications Prior to Admission  Medication Sig Dispense Refill Last Dose  . aspirin EC 81 MG tablet Take 81 mg daily by mouth.   09/20/2017  . enalapril (VASOTEC) 5 MG tablet Take 5 mg daily by mouth.   12/26/2016 at Unknown time  . finasteride (PROSCAR) 5 MG tablet Take 5 mg daily by mouth.   12/26/2016 at Unknown time  . pramipexole (MIRAPEX) 0.5 MG tablet Take 0.5 mg 3 (three) times daily by mouth.   12/26/2016 at Unknown time  . Probiotic Product (ALIGN PO) Take daily by mouth.   12/26/2016 at  Unknown time  . traMADol (ULTRAM) 50 MG tablet Take 50 mg every 6 (six) hours as needed by mouth.   12/27/2016 at Unknown time     Allergies  Allergen Reactions  . Crestor [Rosuvastatin Calcium]   . Nsaids   . Rapaflo [Silodosin]   . Statins Other (See Comments)  . Zetia [Ezetimibe]   . Latex Rash  . Neosporin [Neomycin-Bacitracin Zn-Polymyx] Rash     Past Medical History:  Diagnosis Date  . Abnormal PSA    s/p post prostate biopsy in the past which was negative  . Alcoholism ([place])    with prolonged hospitalization 2009 for withdrawal c/b aspiration pneumonia requiring vent  . Anxiety   . B12 deficiency   . Cardiomyopathy ([place])    mild probably multifactorial secondary to HTn and possible alcohol contribution. Left ventricular ejection fraction app 45 %  . CHF (congestive heart failure) ([place])   . Depression   . Duodenitis   . Dyspnea    on exertion  . Dysrhythmia   . Erosive esophagitis   . Esophageal varices ([place])   . Gross hematuria   . Hypertension   . 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 Sleep apnea   . Spinal stenosis     Review of systems:      Physical Exam    Heart and lungs: Regular rate and rhythm without rub or gallop, lungs are bilaterally clear.    HEENT: Normocephalic atraumatic eyes  are anicteric    Other:    Pertinant exam for procedure: Soft nontender nondistended bowel sounds positive normoactive    Planned proceedures: EGD and indicated procedures. I have discussed the risks benefits and complications of procedures to include not limited to bleeding, infection, perforation and the risk of sedation and the patient wishes to proceed.    Wesley Sails, MD Gastroenterology 09/21/2017  10:19 AM

## 2017-09-21 NOTE — Op Note (Addendum)
[facility] Gastroenterology Patient Name: Wesley Young Procedure Date: 09/21/2017 10:04 AM MRN: [id number] Account #: [id number] Date of Birth: 03/02/1940 Admit Type: Outpatient Age: 77 Room: [facility] ENDO ROOM 1 Gender: Male Note Status: Finalized Procedure:            Upper GI endoscopy Indications:          Dysphagia Providers:            Lollie Sails, MD Referring MD:         Ocie Cornfield. Ouida Sills MD, MD (Referring MD) Medicines:            Monitored Anesthesia Care Complications:        No immediate complications. Procedure:            Pre-Anesthesia Assessment:                       - ASA Grade Assessment: III - A patient with severe                        systemic disease.                       After obtaining informed consent, the endoscope was                        passed under direct vision. Throughout the procedure,                        the patient's blood pressure, pulse, and oxygen                        saturations were monitored continuously. The Endoscope                        was introduced through the mouth, and advanced to the                        third part of duodenum. The patient tolerated the                        procedure well. Findings:      Abnormal motility was noted in the middle third of the esophagus and in       the lower third of the esophagus. The cricopharyngeus was normal. There       are extra peristaltic waves in the esophageal body. The distal       esophagus/lower esophageal sphincter is open. Tertiary peristaltic waves       are noted.      no evidence of stenosis or stricture, esophageal varices are not noted      LA Grade B (one or more mucosal breaks greater than 5 mm, not extending       between the tops of two mucosal folds) esophagitis with no bleeding was       found. Biopsies were taken with a cold forceps for histology.      Localized moderate inflammation characterized by erythema was found on       the  anterior wall of the gastric body. Biopsies were taken with a cold       forceps for histology.      Patchy mild inflammation characterized by congestion (edema) and  erythema was found in the gastric body and in the gastric antrum.       Biopsies were taken with a cold forceps for histology. Biopsies were       taken with a cold forceps for Helicobacter pylori testing.      A single 6 mm submucosal papule (nodule) with no bleeding and no       stigmata of recent bleeding was found on the posterior wall of the       gastric antrum. Biopsies were taken with a cold forceps for histology,       though with positive pillow sign, likely lipoma.      The cardia and gastric fundus were normal on retroflexion otherwise.      Patchy minimal inflammation characterized by erythema was found in the       second portion of the duodenum. Biopsies were taken with a cold forceps       for histology. Impression:           - Abnormal esophageal motility, consistent with                        presbyesophagus.                       - LA Grade B erosive esophagitis. Biopsied.                       - Gastritis. Biopsied.                       - Bile gastritis. Biopsied.                       - A single submucosal papule (nodule) found in the                        stomach. Biopsied.                       - Duodenitis. Biopsied. Recommendation:       - Use Aciphex (rabeprazole) 20 mg PO daily daily, take                        45 minutes AC. Procedure Code(s):    --- Professional ---                       [phone], Esophagogastroduodenoscopy, flexible, transoral;                        with biopsy, single or multiple Diagnosis Code(s):    --- Professional ---                       K22.4, Dyskinesia of esophagus                       K20.8, Other esophagitis                       K29.70, Gastritis, unspecified, without bleeding                       K29.60, Other gastritis without bleeding  K31.89, Other diseases of stomach and duodenum                       K29.80, Duodenitis without bleeding                       R13.10, Dysphagia, unspecified CPT copyright 2017 American Medical Association. All rights reserved. The codes documented in this report are preliminary and upon coder review may  be revised to meet current compliance requirements. Lollie Sails, MD 09/21/2017 11:02:36 AM This report has been signed electronically. Number of Addenda: 0 Note Initiated On: 09/21/2017 10:04 AM      [facility]

## 2017-09-22 NOTE — Anesthesia Postprocedure Evaluation (Signed)
Anesthesia Post Note  Patient: Wesley Young  Procedure(s) Performed: ESOPHAGOGASTRODUODENOSCOPY (EGD) WITH PROPOFOL (N/A )  Patient location during evaluation: PACU Anesthesia Type: General Level of consciousness: awake and alert Pain management: pain level controlled Vital Signs Assessment: post-procedure vital signs reviewed and stable Respiratory status: spontaneous breathing, nonlabored ventilation, respiratory function stable and patient connected to nasal cannula oxygen Cardiovascular status: blood pressure returned to baseline and stable Postop Assessment: no apparent nausea or vomiting Anesthetic complications: no     Last Vitals:  Vitals:   09/21/17 1118 09/21/17 1128  BP: 113/77 109/73  Pulse: 73 (!) 52  Resp: 16 (!) 22  Temp:    SpO2: 99% 100%    Last Pain:  Vitals:   09/22/17 0733  TempSrc:   PainSc: 0-No pain                 Molli Barrows

## 2017-09-22 NOTE — Anesthesia Preprocedure Evaluation (Signed)
Anesthesia Evaluation  Patient identified by MRN, date of birth, ID band Patient awake    Reviewed: Allergy & Precautions, H&P , NPO status , Patient's Chart, lab work & pertinent test results, reviewed documented beta blocker date and time   Airway Mallampati: II   Neck ROM: full    Dental  (+) Poor Dentition   Pulmonary neg pulmonary ROS, shortness of breath, sleep apnea , former smoker,    Pulmonary exam normal        Cardiovascular Exercise Tolerance: Poor hypertension, +CHF  negative cardio ROS Normal cardiovascular exam+ dysrhythmias  Rhythm:regular Rate:Normal     Neuro/Psych PSYCHIATRIC DISORDERS Anxiety Depression negative neurological ROS  negative psych ROS   GI/Hepatic negative GI ROS, Neg liver ROS, PUD,   Endo/Other  negative endocrine ROS  Renal/GU negative Renal ROS  negative genitourinary   Musculoskeletal   Abdominal   Peds  Hematology negative hematology ROS (+)   Anesthesia Other Findings Past Medical History: No date: Abnormal PSA     Comment:  s/p post prostate biopsy in the past which was negative No date: Alcoholism [facility])     Comment:  with prolonged hospitalization 2009 for withdrawal c/b               aspiration pneumonia requiring vent No date: Anxiety No date: B12 deficiency No date: Cardiomyopathy [facility])     Comment:  mild probably multifactorial secondary to HTn and               possible alcohol contribution. 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) No date: Sleep apnea No date: Spinal stenosis Past Surgical History: 12/2016: colonoscopy with  polypectomy 12/27/2016: COLONOSCOPY WITH PROPOFOL; N/A     Comment:  Procedure: COLONOSCOPY WITH PROPOFOL;  Surgeon: Tol[place],               Benay Pike, MD;  Location: [facility] ENDOSCOPY;  Service:               Endoscopy;  Laterality: N/A; 09/21/2017: ESOPHAGOGASTRODUODENOSCOPY (EGD) WITH PROPOFOL; N/A     Comment:  Procedure: ESOPHAGOGASTRODUODENOSCOPY (EGD) WITH               PROPOFOL;  Surgeon: Lollie Sails, MD;  Location:               [facility] ENDOSCOPY;  Service: Endoscopy;  Laterality: N/A; No date: prostat biopsy x2 No date: s/p resection of his right deltoid muscle in 1977 No date: TONSILLECTOMY BMI    Body Mass Index:  22.51 kg/m     Reproductive/Obstetrics negative OB ROS                             Anesthesia Physical Anesthesia Plan  ASA: III  Anesthesia Plan: General   Post-op Pain Management:    Induction:   PONV Risk Score and Plan:   Airway Management Planned:   Additional Equipment:   Intra-op Plan:   Post-operative Plan:   Informed Consent: I have reviewed the patients History and Physical, chart, labs and discussed the procedure including the risks, benefits  and alternatives for the proposed anesthesia with the patient or authorized representative who has indicated his/her understanding and acceptance.   Dental Advisory Given  Plan Discussed with: CRNA  Anesthesia Plan Comments:         Anesthesia Quick Evaluation

## 2017-09-24 LAB — SURGICAL PATHOLOGY

## 2017-12-05 ENCOUNTER — Emergency Department: Payer: Medicare Other

## 2017-12-05 ENCOUNTER — Encounter: Payer: Self-pay | Admitting: Emergency Medicine

## 2017-12-05 ENCOUNTER — Emergency Department
Admission: EM | Admit: 2017-12-05 | Discharge: 2017-12-05 | Disposition: A | Discharge status: Home / Self Care | Payer: Medicare Other | Attending: Emergency Medicine | Admitting: Emergency Medicine

## 2017-12-05 ENCOUNTER — Other Ambulatory Visit: Payer: Self-pay

## 2017-12-05 DIAGNOSIS — Z79899 Other long term (current) drug therapy: Secondary | ICD-10-CM | POA: Insufficient documentation

## 2017-12-05 DIAGNOSIS — Y92017 Garden or yard in single-family (private) house as the place of occurrence of the external cause: Secondary | ICD-10-CM | POA: Insufficient documentation

## 2017-12-05 DIAGNOSIS — M5412 Radiculopathy, cervical region: Secondary | ICD-10-CM | POA: Diagnosis not present

## 2017-12-05 DIAGNOSIS — Z87891 Personal history of nicotine dependence: Secondary | ICD-10-CM | POA: Insufficient documentation

## 2017-12-05 DIAGNOSIS — W010XXA Fall on same level from slipping, tripping and stumbling without subsequent striking against object, initial encounter: Secondary | ICD-10-CM | POA: Diagnosis not present

## 2017-12-05 DIAGNOSIS — S161XXA Strain of muscle, fascia and tendon at neck level, initial encounter: Secondary | ICD-10-CM | POA: Insufficient documentation

## 2017-12-05 DIAGNOSIS — Y999 Unspecified external cause status: Secondary | ICD-10-CM | POA: Insufficient documentation

## 2017-12-05 DIAGNOSIS — I509 Heart failure, unspecified: Secondary | ICD-10-CM | POA: Diagnosis not present

## 2017-12-05 DIAGNOSIS — Y93H2 Activity, gardening and landscaping: Secondary | ICD-10-CM | POA: Insufficient documentation

## 2017-12-05 DIAGNOSIS — I110 Hypertensive heart disease with heart failure: Secondary | ICD-10-CM | POA: Insufficient documentation

## 2017-12-05 DIAGNOSIS — S199XXA Unspecified injury of neck, initial encounter: Secondary | ICD-10-CM | POA: Diagnosis present

## 2017-12-05 DIAGNOSIS — T148XXA Other injury of unspecified body region, initial encounter: Secondary | ICD-10-CM

## 2017-12-05 DIAGNOSIS — Z7982 Long term (current) use of aspirin: Secondary | ICD-10-CM | POA: Diagnosis not present

## 2017-12-05 MED ORDER — LIDOCAINE 5 % EX PTCH
1.0000 | MEDICATED_PATCH | Freq: Once | CUTANEOUS | Status: DC
  Administered 2017-12-05: 1 via TRANSDERMAL
  Filled 2017-12-05: qty 1

## 2017-12-05 MED ORDER — CYCLOBENZAPRINE HCL 10 MG PO TABS: 10.0000 mg | ORAL_TABLET | Freq: Three times a day (TID) | ORAL | 0 refills | Status: DC | PRN

## 2017-12-05 NOTE — ED Notes (Signed)
Patient to CT via stretcher.

## 2017-12-05 NOTE — ED Provider Notes (Signed)
[facility] Emergency Department Provider Note  ____________________________________________   First MD Initiated Contact with Patient 12/05/17 [id number]     (approximate)  I have reviewed the triage vital signs and the nursing notes.   HISTORY  Chief Complaint Neck Injury   HPI Wesley Young is a 77 y.o. male who self presents to the emergency department with 3 days of right posterior neck and right shoulder pain.  Symptoms began 3 days ago when he was pulling out a garden hose and it gave way and he fell backwards onto the ground.  He did not strike his head.  He has a long-standing history of lumbar spinal stenosis although no known cervical disease.  His pain is moderate to severe throbbing aching and cramping.  It seems to radiate from his right mid neck towards his upper shoulder.  Is worse when twisting his head and improved with rest.  No numbness or weakness.    Past Medical History:  Diagnosis Date  . Abnormal PSA    s/p post prostate biopsy in the past which was negative  . Alcoholism ([place])    with prolonged hospitalization 2009 for withdrawal c/b aspiration pneumonia requiring vent  . Anxiety   . B12 deficiency   . Cardiomyopathy ([place])    mild probably multifactorial secondary to HTn and possible alcohol contribution. Left ventricular ejection fraction app 45 %  . CHF (congestive heart failure) ([place])   . Depression   . Duodenitis   . Dyspnea    on exertion  . Dysrhythmia   . Erosive esophagitis   . Esophageal varices ([place])   . Gross hematuria   . Hypertension   . 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[place])   . Sleep apnea   . Spinal stenosis     There are no active problems to display for this patient.   Past Surgical History:  Procedure Laterality Date  . colonoscopy with polypectomy  12/2016  . COLONOSCOPY WITH PROPOFOL N/A 12/27/2016   Procedure: COLONOSCOPY WITH PROPOFOL;  Surgeon: Toledo, Benay Pike, MD;  Location: [facility] ENDOSCOPY;   Service: Endoscopy;  Laterality: N/A;  . ESOPHAGOGASTRODUODENOSCOPY (EGD) WITH PROPOFOL N/A 09/21/2017   Procedure: ESOPHAGOGASTRODUODENOSCOPY (EGD) WITH PROPOFOL;  Surgeon: Lollie Sails, MD;  Location: [facility] ENDOSCOPY;  Service: Endoscopy;  Laterality: N/A;  . prostat biopsy x2    . s/p resection of his right deltoid muscle in 1977    . TONSILLECTOMY      Prior to Admission medications   Medication Sig Start Date End Date Taking? Authorizing Provider  aspirin EC 81 MG tablet Take 81 mg daily by mouth.    [provider]  cyclobenzaprine (FLEXERIL) 10 MG tablet Take 1 tablet (10 mg total) by mouth 3 (three) times daily as needed for muscle spasms. 12/05/17   Darel Hong, MD  enalapril (VASOTEC) 5 MG tablet Take 5 mg daily by mouth.    [provider]  finasteride (PROSCAR) 5 MG tablet Take 5 mg daily by mouth.    [provider]  pramipexole (MIRAPEX) 0.5 MG tablet Take 0.5 mg 3 (three) times daily by mouth.    [provider]  Probiotic Product (ALIGN PO) Take daily by mouth.    [provider]  traMADol (ULTRAM) 50 MG tablet Take 50 mg every 6 (six) hours as needed by mouth.    [provider]    Allergies Crestor [rosuvastatin calcium]; Nsaids; Rapaflo [silodosin]; Statins; Zetia [ezetimibe]; Latex; and Neosporin [neomycin-bacitracin zn-polymyx]  No  family history on file.  Social History Social History   Tobacco Use  . Smoking status: Former Research scientist (life sciences)  . Smokeless tobacco: Never Used  Substance Use Topics  . Alcohol use: Not Currently  . Drug use: Not Currently    Review of Systems Constitutional: No fever/chills ENT: No sore throat. Cardiovascular: Denies chest pain. Respiratory: Denies shortness of breath. Gastrointestinal: No abdominal pain.  No nausea, no vomiting.  Genitourinary: Negative for dysuria. Musculoskeletal: Positive for neck pain Skin: Negative for rash. Neurological: Negative for headaches,  focal weakness or numbness.   ____________________________________________   PHYSICAL EXAM:  VITAL SIGNS: ED Triage Vitals  Enc Vitals Group     BP 12/05/17 0449 (!) 148/70     Pulse Rate 12/05/17 0449 92     Resp 12/05/17 0449 18     Temp 12/05/17 0449 98.4 F (36.9 C)     Temp Source 12/05/17 0449 Oral     SpO2 12/05/17 0449 99 %     Weight 12/05/17 0447 170 lb (77.1 kg)     Height 12/05/17 0447 6' (1.829 m)     Head Circumference --      Peak Flow --      Pain Score 12/05/17 0446 3     Pain Loc --      Pain Edu? --      Excl. in [place]? --     Constitutional: Alert and oriented x4 appears somewhat stiff although nontoxic no diaphoresis speaks of full clear sentences Eyes: PERRL EOMI. midrange and brisk Neck: No stridor.  No midline tenderness.  Somewhat tender paraspinal over the mid cervical area and upper trap Cardiovascular: Normal rate, regular rhythm. Grossly normal heart sounds.  Good peripheral circulation. Respiratory: Normal respiratory effort.  No retractions. Lungs CTAB and moving good air Gastrointestinal: Soft nontender Musculoskeletal: No lower extremity edema   Neurologic:  Normal speech and language. No gross focal neurologic deficits are appreciated. Skin:  Skin is warm, dry and intact. No rash noted. Psychiatric: Mood and affect are normal. Speech and behavior are normal.    ____________________________________________   DIFFERENTIAL includes but not limited to  Muscle strain, radiculopathy, fracture, dislocation ____________________________________________   LABS (all labs ordered are listed, but only abnormal results are displayed)  Labs Reviewed - No data to display   __________________________________________  EKG   ____________________________________________  RADIOLOGY  CT of the neck reviewed by me with chronic changes but no acute disease ____________________________________________   PROCEDURES  Procedure(s) performed:  no  Procedures  Critical Care performed: no  ____________________________________________   INITIAL IMPRESSION / ASSESSMENT AND PLAN / ED COURSE  Pertinent labs & imaging results that were available during my care of the patient were reviewed by me and considered in my medical decision making (see chart for details).   As part of my medical decision making, I reviewed the following data within the [place] History obtained from family if available, nursing notes, old chart and ekg, as well as notes from prior ED visits.  The patient comes to the emergency department with radicular sounding neck pain.  He declines opioids and declines trigger point injection.  I obtained a CT scan given his advanced age, trauma, and known lumbar spinal stenosis was shows chronic changes but no acute disease.  Given a lidocaine patch with some improvement in his symptoms.  We will discharge him home with a short course of Flexeril and primary care follow-up.  Strict return precautions were given.  ____________________________________________   FINAL CLINICAL IMPRESSION(S) / ED DIAGNOSES  Final diagnoses:  Muscle strain  Cervical radiculopathy      NEW MEDICATIONS STARTED DURING THIS VISIT:  Discharge Medication List as of 12/05/2017  5:46 AM    START taking these medications   Details  cyclobenzaprine (FLEXERIL) 10 MG tablet Take 1 tablet (10 mg total) by mouth 3 (three) times daily as needed for muscle spasms., Starting Tue 12/05/2017, Print         Note:  This document was prepared using Dragon voice recognition software and may include unintentional dictation errors.     Darel Hong, MD 12/05/17 [phone]

## 2017-12-05 NOTE — ED Triage Notes (Signed)
Patient ambulatory to triage with steady gait, without difficulty or distress noted; st wk ago was pulling on hose and fell; denies hitting head but has been having right sided neck/shoulder pain since

## 2017-12-05 NOTE — ED Notes (Signed)
Patient states water hose had snagged on a bush and he tugged on it and it came lose all at once and he fell landing on his right side, denies hitting head or loss of consciousness..  Patient reports having pain on right side of neck.  Reports tonight had "spasm" that was severe and lasted for a long time tonight.  Patient is alert and oriented.

## 2017-12-05 NOTE — Discharge Instructions (Signed)
It was a pleasure to take care of you today, and thank you for coming to our emergency department.  If you have any questions or concerns before leaving please ask the nurse to grab me and I'm more than happy to go through your aftercare instructions again.  If you have any concerns once you are home that you are not improving or are in fact getting worse before you can make it to your follow-up appointment, please do not hesitate to call 911 and come back for further evaluation.  Darel Hong, MD   Ct Cervical Spine Wo Contrast  Result Date: 12/05/2017 CLINICAL DATA:  77 y/o  M; fall with right-sided neck pain. EXAM: CT CERVICAL SPINE WITHOUT CONTRAST TECHNIQUE: Multidetector CT imaging of the cervical spine was performed without intravenous contrast. Multiplanar CT image reconstructions were also generated. COMPARISON:  None. FINDINGS: Alignment: Reversal of cervical curvature at C4-5. C4-5 and C7-T1 grade 1 anterolisthesis with prominent facet arthropathy. Skull base and vertebrae: No acute fracture. No primary bone lesion or focal pathologic process. Soft tissues and spinal canal: No prevertebral fluid or swelling. No visible canal hematoma. Disc levels: C6-7 vertebral body fusion on degenerative basis. Moderate loss of C5-6 and C7-T1 intervertebral disc space height. Disc osteophyte complexes with prominent uncovertebral hypertrophy and advanced bilateral upper cervical facet hypertrophy combines to result in bony neural foraminal stenosis bilaterally at the C3-4 through C5-6 levels. Mild-to-moderate spinal canal stenosis at C4-5 level. Upper chest: Calcified pleural plaque at the lung apices. Other: None. IMPRESSION: 1. No acute fracture or traumatic subluxation of the cervical spine. 2. Reversal of cervical curvature. C4-5 and C7-T1 grade 1 anterolisthesis. 3. Moderate to severe discogenic degenerative changes from C5 through T1 and advanced upper cervical facet arthropathy. 4. Mild to moderate  C4-5 canal stenosis. 5. Uncovertebral and facet hypertrophy with neural foraminal encroachment at C3-4 through C5-6. Electronically Signed   By: Kristine Garbe M.D.   On: 12/05/2017 05:42

## 2018-03-01 ENCOUNTER — Other Ambulatory Visit
Admission: RE | Admit: 2018-03-01 | Discharge: 2018-03-01 | Disposition: A | Discharge status: Home / Self Care | Payer: Medicare Other | Source: Other Acute Inpatient Hospital | Attending: Internal Medicine | Admitting: Internal Medicine

## 2018-03-01 DIAGNOSIS — R079 Chest pain, unspecified: Secondary | ICD-10-CM | POA: Diagnosis present

## 2018-03-01 DIAGNOSIS — I509 Heart failure, unspecified: Secondary | ICD-10-CM | POA: Insufficient documentation

## 2018-03-01 DIAGNOSIS — Z125 Encounter for screening for malignant neoplasm of prostate: Secondary | ICD-10-CM | POA: Insufficient documentation

## 2018-03-01 LAB — TROPONIN I: Troponin I: <0.03 ng/mL (ref <0.03)

## 2019-03-04 ENCOUNTER — Encounter: Payer: Self-pay | Admitting: Ophthalmology

## 2019-03-04 ENCOUNTER — Other Ambulatory Visit: Payer: Self-pay

## 2019-03-08 ENCOUNTER — Other Ambulatory Visit: Payer: Self-pay

## 2019-03-08 ENCOUNTER — Other Ambulatory Visit
Admission: RE | Admit: 2019-03-08 | Discharge: 2019-03-08 | Disposition: A | Discharge status: Home / Self Care | Payer: Medicare Other | Source: Ambulatory Visit | Attending: Ophthalmology | Admitting: Ophthalmology

## 2019-03-08 DIAGNOSIS — Z01812 Encounter for preprocedural laboratory examination: Secondary | ICD-10-CM | POA: Diagnosis present

## 2019-03-08 DIAGNOSIS — Z20822 Contact with and (suspected) exposure to covid-19: Secondary | ICD-10-CM | POA: Diagnosis not present

## 2019-03-08 NOTE — Anesthesia Preprocedure Evaluation (Addendum)
Anesthesia Evaluation  Patient identified by MRN, date of birth, ID band Patient awake    Reviewed: Allergy & Precautions, NPO status , Patient's Chart, lab work & pertinent test results  History of Anesthesia Complications Negative for: history of anesthetic complications  Airway Mallampati: II  TM Distance: >3 FB Neck ROM: Full    Dental   Pulmonary sleep apnea , former smoker,    breath sounds clear to auscultation       Cardiovascular hypertension, (-) angina+CHF  (-) DOE + dysrhythmias Atrial Fibrillation  Rhythm:Regular Rate:Normal   HLD  TTE (05/06/2016): Normal LV fn w/ LVEF > 55% Mild MR   Neuro/Psych PSYCHIATRIC DISORDERS Anxiety Depression  Neuromuscular disease (Spinal stenosis)    GI/Hepatic PUD, neg GERD  ,(+)     substance abuse (Recovering alcoholic, 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)  alcohol use,  Esophageal varices   Endo/Other    Renal/GU      Musculoskeletal   Abdominal   Peds  Hematology   Anesthesia Other Findings Sarcoma Skin cancer  Reproductive/Obstetrics                            Anesthesia Physical Anesthesia Plan  ASA: III  Anesthesia Plan: MAC   Post-op Pain Management:    Induction: Intravenous  PONV Risk Score and Plan: 2 and Treatment may vary due to age or medical condition  Airway Management Planned: Nasal Cannula  Additional Equipment:   Intra-op Plan:   Post-operative Plan:   Informed Consent: I have reviewed the patients History and Physical, chart, labs and discussed the procedure including the risks, benefits and alternatives for the proposed anesthesia with the patient or authorized representative who has indicated his/her understanding and acceptance.       Plan Discussed with: CRNA and Anesthesiologist  Anesthesia Plan Comments: (Patient requesting he receive no medications that would feel similar to EtOH. Discussed MAC with and  without sedation. Patient would prefer no sedation.)      Anesthesia Quick Evaluation

## 2019-03-09 LAB — SARS CORONAVIRUS 2 (TAT 6-24 HRS): SARS Coronavirus 2: NEGATIVE

## 2019-03-11 NOTE — Discharge Instructions (Signed)

## 2019-03-12 ENCOUNTER — Encounter
Admission: RE | Disposition: A | Discharge status: Home / Self Care | Payer: Self-pay | Source: Home / Self Care | Attending: Ophthalmology

## 2019-03-12 ENCOUNTER — Ambulatory Visit: Payer: Medicare Other | Admitting: Anesthesiology

## 2019-03-12 ENCOUNTER — Ambulatory Visit
Admission: RE | Admit: 2019-03-12 | Discharge: 2019-03-12 | Disposition: A | Discharge status: Home / Self Care | Payer: Medicare Other | Attending: Ophthalmology | Admitting: Ophthalmology

## 2019-03-12 ENCOUNTER — Encounter: Payer: Self-pay | Admitting: Ophthalmology

## 2019-03-12 DIAGNOSIS — Z87891 Personal history of nicotine dependence: Secondary | ICD-10-CM | POA: Diagnosis not present

## 2019-03-12 DIAGNOSIS — G2581 Restless legs syndrome: Secondary | ICD-10-CM | POA: Insufficient documentation

## 2019-03-12 DIAGNOSIS — Z79899 Other long term (current) drug therapy: Secondary | ICD-10-CM | POA: Diagnosis not present

## 2019-03-12 DIAGNOSIS — G4730 Sleep apnea, unspecified: Secondary | ICD-10-CM | POA: Diagnosis not present

## 2019-03-12 DIAGNOSIS — Z7982 Long term (current) use of aspirin: Secondary | ICD-10-CM | POA: Insufficient documentation

## 2019-03-12 DIAGNOSIS — I509 Heart failure, unspecified: Secondary | ICD-10-CM | POA: Insufficient documentation

## 2019-03-12 DIAGNOSIS — I110 Hypertensive heart disease with heart failure: Secondary | ICD-10-CM | POA: Diagnosis not present

## 2019-03-12 DIAGNOSIS — K219 Gastro-esophageal reflux disease without esophagitis: Secondary | ICD-10-CM | POA: Diagnosis not present

## 2019-03-12 DIAGNOSIS — N400 Enlarged prostate without lower urinary tract symptoms: Secondary | ICD-10-CM | POA: Insufficient documentation

## 2019-03-12 DIAGNOSIS — Z85831 Personal history of malignant neoplasm of soft tissue: Secondary | ICD-10-CM | POA: Diagnosis not present

## 2019-03-12 DIAGNOSIS — H2512 Age-related nuclear cataract, left eye: Secondary | ICD-10-CM | POA: Diagnosis present

## 2019-03-12 DIAGNOSIS — R413 Other amnesia: Secondary | ICD-10-CM | POA: Insufficient documentation

## 2019-03-12 DIAGNOSIS — Z85820 Personal history of malignant melanoma of skin: Secondary | ICD-10-CM | POA: Insufficient documentation

## 2019-03-12 HISTORY — DX: Presence of dental prosthetic device (complete) (partial): Z97.2

## 2019-03-12 HISTORY — PX: CATARACT EXTRACTION W/PHACO: SHX586

## 2019-03-12 SURGERY — PHACOEMULSIFICATION, CATARACT, WITH IOL INSERTION
Anesthesia: Monitor Anesthesia Care | Site: Eye | Laterality: Left

## 2019-03-12 MED ORDER — MOXIFLOXACIN HCL 0.5 % OP SOLN
OPHTHALMIC | Status: DC | PRN
  Administered 2019-03-12: 0.2 mL via OPHTHALMIC

## 2019-03-12 MED ORDER — TETRACAINE HCL 0.5 % OP SOLN
1.0000 [drp] | OPHTHALMIC | Status: DC | PRN
  Administered 2019-03-12 (×3): 1 [drp] via OPHTHALMIC

## 2019-03-12 MED ORDER — NA CHONDROIT SULF-NA HYALURON 40-17 MG/ML IO SOLN
INTRAOCULAR | Status: DC | PRN
  Administered 2019-03-12: 1 mL via INTRAOCULAR

## 2019-03-12 MED ORDER — LIDOCAINE HCL (PF) 2 % IJ SOLN
INTRAOCULAR | Status: DC | PRN
  Administered 2019-03-12: 2 mL

## 2019-03-12 MED ORDER — EPINEPHRINE PF 1 MG/ML IJ SOLN
INTRAOCULAR | Status: DC | PRN
  Administered 2019-03-12: 83 mL via OPHTHALMIC

## 2019-03-12 MED ORDER — ARMC OPHTHALMIC DILATING DROPS
1.0000 "application " | OPHTHALMIC | Status: DC | PRN
  Administered 2019-03-12 (×3): 1 via OPHTHALMIC

## 2019-03-12 MED ORDER — ACETAMINOPHEN 10 MG/ML IV SOLN: 1000.0000 mg | Freq: Once | INTRAVENOUS | Status: DC | PRN

## 2019-03-12 MED ORDER — ONDANSETRON HCL 4 MG/2ML IJ SOLN: 4.0000 mg | Freq: Once | INTRAMUSCULAR | Status: DC | PRN

## 2019-03-12 MED ORDER — LACTATED RINGERS IV SOLN: 100.0000 mL/h | INTRAVENOUS | Status: DC

## 2019-03-12 MED ORDER — BRIMONIDINE TARTRATE-TIMOLOL 0.2-0.5 % OP SOLN
OPHTHALMIC | Status: DC | PRN
  Administered 2019-03-12: 1 [drp] via OPHTHALMIC

## 2019-03-12 SURGICAL SUPPLY — 21 items
CANNULA ANT/CHMB 27G (MISCELLANEOUS) ×2 IMPLANT
CANNULA ANT/CHMB 27GA (MISCELLANEOUS) ×6 IMPLANT
GLOVE BIOGEL PI IND STRL 7.0 (GLOVE) IMPLANT
GLOVE BIOGEL PI IND STRL 8 (GLOVE) IMPLANT
GLOVE BIOGEL PI INDICATOR 7.0 (GLOVE) ×2
GLOVE BIOGEL PI INDICATOR 8 (GLOVE) ×4
GOWN STRL REUS W/ TWL LRG LVL3 (GOWN DISPOSABLE) ×2 IMPLANT
GOWN STRL REUS W/TWL LRG LVL3 (GOWN DISPOSABLE) ×4
LENS IOL TECNIS ITEC 21.0 (Intraocular Lens) ×2 IMPLANT
MARKER SKIN DUAL TIP RULER LAB (MISCELLANEOUS) ×3 IMPLANT
NDL FILTER BLUNT 18X1 1/2 (NEEDLE) ×1 IMPLANT
NDL RETROBULBAR .5 NSTRL (NEEDLE) ×3 IMPLANT
NEEDLE FILTER BLUNT 18X 1/2SAF (NEEDLE) ×2
NEEDLE FILTER BLUNT 18X1 1/2 (NEEDLE) ×1 IMPLANT
PACK EYE AFTER SURG (MISCELLANEOUS) ×3 IMPLANT
PACK OPTHALMIC (MISCELLANEOUS) ×3 IMPLANT
PACK PORFILIO (MISCELLANEOUS) ×3 IMPLANT
SYR 3ML LL SCALE MARK (SYRINGE) ×3 IMPLANT
SYR TB 1ML LUER SLIP (SYRINGE) ×3 IMPLANT
WATER STERILE IRR 250ML POUR (IV SOLUTION) ×3 IMPLANT
WIPE NON LINTING 3.25X3.25 (MISCELLANEOUS) ×3 IMPLANT

## 2019-03-12 NOTE — Transfer of Care (Signed)
Immediate Anesthesia Transfer of Care Note  Patient: Wesley Young  Procedure(s) Performed: CATARACT EXTRACTION PHACO AND INTRAOCULAR LENS PLACEMENT (IOC) LEFT 5.27 00:38.6 (Left Eye)  Patient Location: PACU  Anesthesia Type: MAC  Level of Consciousness: awake, alert  and patient cooperative  Airway and Oxygen Therapy: Patient Spontanous Breathing and Patient connected to supplemental oxygen  Post-op Assessment: Post-op Vital signs reviewed, Patient's Cardiovascular Status Stable, Respiratory Function Stable, Patent Airway and No signs of Nausea or vomiting  Post-op Vital Signs: Reviewed and stable  Complications: No apparent anesthesia complications

## 2019-03-12 NOTE — H&P (Signed)
All labs reviewed. Abnormal studies sent to patients PCP when indicated.  Previous H&P reviewed, patient examined, there are NO CHANGES.  Wesley Rarick Porfilio1/19/202110:52 AM

## 2019-03-12 NOTE — Anesthesia Postprocedure Evaluation (Signed)
Anesthesia Post Note  Patient: Wesley Young  Procedure(s) Performed: CATARACT EXTRACTION PHACO AND INTRAOCULAR LENS PLACEMENT (IOC) LEFT 5.27 00:38.6 (Left Eye)     Patient location during evaluation: PACU Anesthesia Type: MAC Level of consciousness: awake and alert Pain management: pain level controlled Vital Signs Assessment: post-procedure vital signs reviewed and stable Respiratory status: spontaneous breathing, nonlabored ventilation, respiratory function stable and patient connected to nasal cannula oxygen Cardiovascular status: stable and blood pressure returned to baseline Postop Assessment: no apparent nausea or vomiting Anesthetic complications: no    Shenice Dolder A  Ladeidra Borys

## 2019-03-12 NOTE — Anesthesia Procedure Notes (Signed)
Procedure Name: MAC Performed by: Izetta Dakin, CRNA Pre-anesthesia Checklist: Timeout performed, Patient being monitored, Suction available, Emergency Drugs available and Patient identified Patient Re-evaluated:Patient Re-evaluated prior to induction Oxygen Delivery Method: Nasal cannula

## 2019-03-12 NOTE — Op Note (Signed)
PREOPERATIVE DIAGNOSIS:  Nuclear sclerotic cataract of the left eye.   POSTOPERATIVE DIAGNOSIS:  Nuclear sclerotic cataract of the left eye.   OPERATIVE PROCEDURE:@   SURGEON:  Birder Robson, MD.   ANESTHESIA:  Anesthesiologist: Heniser, Fredric Dine, MD CRNA: Izetta Dakin, CRNA  1.      Managed anesthesia care. 2.     0.30ml of Shugarcaine was instilled following the paracentesis   COMPLICATIONS:  None.   TECHNIQUE:   Stop and chop   DESCRIPTION OF PROCEDURE:  The patient was examined and consented in the preoperative holding area where the aforementioned topical anesthesia was applied to the left eye and then brought back to the Operating Room where the left eye was prepped and draped in the usual sterile ophthalmic fashion and a lid speculum was placed. A paracentesis was created with the side port blade and the anterior chamber was filled with viscoelastic. A near clear corneal incision was performed with the steel keratome. A continuous curvilinear capsulorrhexis was performed with a cystotome followed by the capsulorrhexis forceps. Hydrodissection and hydrodelineation were carried out with BSS on a blunt cannula. The lens was removed in a stop and chop  technique and the remaining cortical material was removed with the irrigation-aspiration handpiece. The capsular bag was inflated with viscoelastic and the Technis [id number] lens was placed in the capsular bag without complication. The remaining viscoelastic was removed from the eye with the irrigation-aspiration handpiece. The wounds were hydrated. The anterior chamber was flushed with BSS and the eye was inflated to physiologic pressure. 0.70ml Vigamox was placed in the anterior chamber. The wounds were found to be water tight. The eye was dressed with Combigan. The patient was given protective glasses to wear throughout the day and a shield with which to sleep tonight. The patient was also given drops with which to begin a drop regimen today  and will follow-up with me in one day. Implant Name Type Inv. Item Serial No. Manufacturer Lot No. LRB No. Used Action  LENS IOL DIOP 21.0 - [id number] Intraocular Lens LENS IOL DIOP 21.0 [id number]  Left 1 Implanted    Procedure(s) with comments: CATARACT EXTRACTION PHACO AND INTRAOCULAR LENS PLACEMENT (IOC) LEFT 5.27 00:38.6 (Left) - sleep apnea  Electronically signed: Birder Robson 03/12/2019 11:25 AM

## 2019-03-13 ENCOUNTER — Encounter: Payer: Self-pay | Admitting: *Deleted

## 2019-04-03 ENCOUNTER — Encounter: Payer: Self-pay | Admitting: Anesthesiology

## 2019-04-03 ENCOUNTER — Other Ambulatory Visit: Payer: Self-pay

## 2019-04-03 ENCOUNTER — Encounter: Payer: Self-pay | Admitting: Ophthalmology

## 2019-04-05 ENCOUNTER — Other Ambulatory Visit
Admission: RE | Admit: 2019-04-05 | Discharge: 2019-04-05 | Disposition: A | Discharge status: Home / Self Care | Payer: Medicare Other | Source: Ambulatory Visit | Attending: Ophthalmology | Admitting: Ophthalmology

## 2019-04-05 ENCOUNTER — Other Ambulatory Visit: Payer: Self-pay

## 2019-04-05 DIAGNOSIS — Z20822 Contact with and (suspected) exposure to covid-19: Secondary | ICD-10-CM | POA: Diagnosis not present

## 2019-04-05 DIAGNOSIS — Z01812 Encounter for preprocedural laboratory examination: Secondary | ICD-10-CM | POA: Insufficient documentation

## 2019-04-05 NOTE — Discharge Instructions (Signed)

## 2019-04-06 LAB — SARS CORONAVIRUS 2 (TAT 6-24 HRS): SARS Coronavirus 2: NEGATIVE

## 2019-04-09 ENCOUNTER — Other Ambulatory Visit: Payer: Self-pay

## 2019-04-09 ENCOUNTER — Encounter: Payer: Self-pay | Admitting: Ophthalmology

## 2019-04-09 ENCOUNTER — Ambulatory Visit
Admission: RE | Admit: 2019-04-09 | Discharge: 2019-04-09 | Disposition: A | Discharge status: Home / Self Care | Payer: Medicare Other | Attending: Ophthalmology | Admitting: Ophthalmology

## 2019-04-09 ENCOUNTER — Encounter
Admission: RE | Disposition: A | Discharge status: Home / Self Care | Payer: Self-pay | Source: Home / Self Care | Attending: Ophthalmology

## 2019-04-09 DIAGNOSIS — Z8589 Personal history of malignant neoplasm of other organs and systems: Secondary | ICD-10-CM | POA: Diagnosis not present

## 2019-04-09 DIAGNOSIS — I10 Essential (primary) hypertension: Secondary | ICD-10-CM | POA: Diagnosis not present

## 2019-04-09 DIAGNOSIS — Z87891 Personal history of nicotine dependence: Secondary | ICD-10-CM | POA: Insufficient documentation

## 2019-04-09 DIAGNOSIS — Z85820 Personal history of malignant melanoma of skin: Secondary | ICD-10-CM | POA: Insufficient documentation

## 2019-04-09 DIAGNOSIS — Z923 Personal history of irradiation: Secondary | ICD-10-CM | POA: Insufficient documentation

## 2019-04-09 DIAGNOSIS — H2511 Age-related nuclear cataract, right eye: Secondary | ICD-10-CM | POA: Diagnosis present

## 2019-04-09 DIAGNOSIS — G4730 Sleep apnea, unspecified: Secondary | ICD-10-CM | POA: Insufficient documentation

## 2019-04-09 DIAGNOSIS — Z9842 Cataract extraction status, left eye: Secondary | ICD-10-CM | POA: Diagnosis not present

## 2019-04-09 HISTORY — PX: CATARACT EXTRACTION W/PHACO: SHX586

## 2019-04-09 SURGERY — PHACOEMULSIFICATION, CATARACT, WITH IOL INSERTION
Anesthesia: Topical | Site: Eye | Laterality: Right

## 2019-04-09 MED ORDER — MOXIFLOXACIN HCL 0.5 % OP SOLN
OPHTHALMIC | Status: DC | PRN
  Administered 2019-04-09: 0.2 mL via OPHTHALMIC

## 2019-04-09 MED ORDER — LIDOCAINE HCL (PF) 2 % IJ SOLN
INTRAOCULAR | Status: DC | PRN
  Administered 2019-04-09: 2 mL

## 2019-04-09 MED ORDER — ARMC OPHTHALMIC DILATING DROPS
1.0000 "application " | OPHTHALMIC | Status: DC | PRN
  Administered 2019-04-09 (×3): 1 via OPHTHALMIC

## 2019-04-09 MED ORDER — TETRACAINE HCL 0.5 % OP SOLN
1.0000 [drp] | OPHTHALMIC | Status: DC | PRN
  Administered 2019-04-09 (×3): 1 [drp] via OPHTHALMIC

## 2019-04-09 MED ORDER — EPINEPHRINE PF 1 MG/ML IJ SOLN
INTRAOCULAR | Status: DC | PRN
  Administered 2019-04-09: 73 mL via OPHTHALMIC

## 2019-04-09 MED ORDER — BRIMONIDINE TARTRATE-TIMOLOL 0.2-0.5 % OP SOLN
OPHTHALMIC | Status: DC | PRN
  Administered 2019-04-09: 1 [drp] via OPHTHALMIC

## 2019-04-09 MED ORDER — NA CHONDROIT SULF-NA HYALURON 40-17 MG/ML IO SOLN
INTRAOCULAR | Status: DC | PRN
  Administered 2019-04-09: 1 mL via INTRAOCULAR

## 2019-04-09 SURGICAL SUPPLY — 19 items
CANNULA ANT/CHMB 27GA (MISCELLANEOUS) ×6 IMPLANT
GLOVE SURG LX 8.0 MICRO (GLOVE) ×2
GLOVE SURG LX STRL 8.0 MICRO (GLOVE) ×1 IMPLANT
GLOVE SURG TRIUMPH 8.0 PF LTX (GLOVE) ×3 IMPLANT
GOWN STRL REUS W/ TWL LRG LVL3 (GOWN DISPOSABLE) ×2 IMPLANT
GOWN STRL REUS W/TWL LRG LVL3 (GOWN DISPOSABLE) ×4
LENS IOL DIOP 21.5 (Intraocular Lens) ×3 IMPLANT
LENS IOL TECNIS MONO 21.5 (Intraocular Lens) ×1 IMPLANT
MARKER SKIN DUAL TIP RULER LAB (MISCELLANEOUS) ×3 IMPLANT
NDL RETROBULBAR .5 NSTRL (NEEDLE) ×3 IMPLANT
NEEDLE FILTER BLUNT 18X 1/2SAF (NEEDLE) ×2
NEEDLE FILTER BLUNT 18X1 1/2 (NEEDLE) ×1 IMPLANT
PACK EYE AFTER SURG (MISCELLANEOUS) ×3 IMPLANT
PACK OPTHALMIC (MISCELLANEOUS) ×3 IMPLANT
PACK PORFILIO (MISCELLANEOUS) ×3 IMPLANT
SYR 3ML LL SCALE MARK (SYRINGE) ×3 IMPLANT
SYR TB 1ML LUER SLIP (SYRINGE) ×3 IMPLANT
WATER STERILE IRR 250ML POUR (IV SOLUTION) ×3 IMPLANT
WIPE NON LINTING 3.25X3.25 (MISCELLANEOUS) ×3 IMPLANT

## 2019-04-09 NOTE — H&P (Signed)
All labs reviewed. Abnormal studies sent to patients PCP when indicated.  Previous H&P reviewed, patient examined, there are NO CHANGES.  Wesley Snuffer Porfilio2/16/202110:36 AM

## 2019-04-09 NOTE — OR Nursing (Signed)
1050:  BP 181/101 O2 Sat 100 P 56  1110: BP 159/98 O2 Sat 100 P 64

## 2019-04-09 NOTE — Op Note (Signed)
PREOPERATIVE DIAGNOSIS:  Nuclear sclerotic cataract of the right eye.   POSTOPERATIVE DIAGNOSIS:  H25.11 Cataract   OPERATIVE PROCEDURE:@   SURGEON:  Birder Robson, MD.   ANESTHESIA:  No anesthesia staff entered.  1.      Managed anesthesia care. 2.      0.56ml of Shugarcaine was instilled in the eye following the paracentesis.   COMPLICATIONS:  None.   TECHNIQUE:   Stop and chop   DESCRIPTION OF PROCEDURE:  The patient was examined and consented in the preoperative holding area where the aforementioned topical anesthesia was applied to the right eye and then brought back to the Operating Room where the right eye was prepped and draped in the usual sterile ophthalmic fashion and a lid speculum was placed. A paracentesis was created with the side port blade and the anterior chamber was filled with viscoelastic. A near clear corneal incision was performed with the steel keratome. A continuous curvilinear capsulorrhexis was performed with a cystotome followed by the capsulorrhexis forceps. Hydrodissection and hydrodelineation were carried out with BSS on a blunt cannula. The lens was removed in a stop and chop  technique and the remaining cortical material was removed with the irrigation-aspiration handpiece. The capsular bag was inflated with viscoelastic and the Technis [id number]  lens was placed in the capsular bag without complication. The remaining viscoelastic was removed from the eye with the irrigation-aspiration handpiece. The wounds were hydrated. The anterior chamber was flushed with BSS and the eye was inflated to physiologic pressure. 0.57ml of Vigamox was placed in the anterior chamber. The wounds were found to be water tight. The eye was dressed with Combigan. The patient was given protective glasses to wear throughout the day and a shield with which to sleep tonight. The patient was also given drops with which to begin a drop regimen today and will follow-up with me in one day. Implant  Name Type Inv. Item Serial No. Manufacturer Lot No. LRB No. Used Action  LENS IOL DIOP 21.5 - [id number] Intraocular Lens LENS IOL DIOP 21.5 [id number]  Right 1 Implanted   Procedure(s): CATARACT EXTRACTION PHACO AND INTRAOCULAR LENS PLACEMENT (IOC) RIGHT 4.84 00:52.4 (Right)  Electronically signed: Birder Robson 04/09/2019 11:12 AM

## 2019-04-10 ENCOUNTER — Encounter: Payer: Self-pay | Admitting: *Deleted

## 2019-06-13 ENCOUNTER — Other Ambulatory Visit: Payer: Self-pay | Admitting: Physical Medicine and Rehabilitation

## 2019-06-13 DIAGNOSIS — M5416 Radiculopathy, lumbar region: Secondary | ICD-10-CM

## 2019-06-26 ENCOUNTER — Ambulatory Visit
Admission: RE | Admit: 2019-06-26 | Discharge: 2019-06-26 | Disposition: A | Discharge status: Home / Self Care | Payer: Medicare Other | Source: Ambulatory Visit | Attending: Physical Medicine and Rehabilitation | Admitting: Physical Medicine and Rehabilitation

## 2019-06-26 ENCOUNTER — Other Ambulatory Visit: Payer: Self-pay

## 2019-06-26 DIAGNOSIS — M5416 Radiculopathy, lumbar region: Secondary | ICD-10-CM | POA: Insufficient documentation

## 2019-08-07 ENCOUNTER — Ambulatory Visit (INDEPENDENT_AMBULATORY_CARE_PROVIDER_SITE_OTHER): Payer: Medicare Other | Admitting: Dermatology

## 2019-08-07 ENCOUNTER — Other Ambulatory Visit: Payer: Self-pay

## 2019-08-07 DIAGNOSIS — L570 Actinic keratosis: Secondary | ICD-10-CM

## 2019-08-07 DIAGNOSIS — I8310 Varicose veins of unspecified lower extremity with inflammation: Secondary | ICD-10-CM

## 2019-08-07 DIAGNOSIS — I809 Phlebitis and thrombophlebitis of unspecified site: Secondary | ICD-10-CM

## 2019-08-07 DIAGNOSIS — Z86018 Personal history of other benign neoplasm: Secondary | ICD-10-CM

## 2019-08-07 DIAGNOSIS — D229 Melanocytic nevi, unspecified: Secondary | ICD-10-CM

## 2019-08-07 DIAGNOSIS — Z85820 Personal history of malignant melanoma of skin: Secondary | ICD-10-CM

## 2019-08-07 DIAGNOSIS — Z85828 Personal history of other malignant neoplasm of skin: Secondary | ICD-10-CM

## 2019-08-07 DIAGNOSIS — L821 Other seborrheic keratosis: Secondary | ICD-10-CM

## 2019-08-07 DIAGNOSIS — Z1283 Encounter for screening for malignant neoplasm of skin: Secondary | ICD-10-CM | POA: Diagnosis not present

## 2019-08-07 DIAGNOSIS — L578 Other skin changes due to chronic exposure to nonionizing radiation: Secondary | ICD-10-CM

## 2019-08-07 DIAGNOSIS — L814 Other melanin hyperpigmentation: Secondary | ICD-10-CM

## 2019-08-07 DIAGNOSIS — D1801 Hemangioma of skin and subcutaneous tissue: Secondary | ICD-10-CM

## 2019-08-07 DIAGNOSIS — L905 Scar conditions and fibrosis of skin: Secondary | ICD-10-CM

## 2019-08-07 NOTE — Progress Notes (Signed)
Follow-Up Visit   Subjective  Wesley Young is a 79 y.o. male who presents for the following: Annual Exam (Hx MMIS, SCC, BCC, AK's, ISK's, fibrosarcoma - no new or changing skin lesions that the patient has noticed or is concerned about). The patient presents for Total-Body Skin Exam (TBSE) for skin cancer screening and mole check.   The following portions of the chart were reviewed this encounter and updated as appropriate:  Tobacco  Allergies  Meds  Problems  Med Hx  Surg Hx  Fam Hx     Review of Systems:  No other skin or systemic complaints except as noted in HPI or Assessment and Plan.  Objective  Well appearing patient in no apparent distress; mood and affect are within normal limits.  A full examination was performed including scalp, head, eyes, ears, nose, lips, neck, chest, axillae, abdomen, back, buttocks, bilateral upper extremities, bilateral lower extremities, hands, feet, fingers, toes, fingernails, and toenails. All findings within normal limits unless otherwise noted below.  Objective  Face x 9 (9): Erythematous thin papules/macules with gritty scale.   Assessment & Plan    AK (actinic keratosis) (9) Face x 9  Destruction of lesion - Face x 9 Complexity: simple   Destruction method: cryotherapy   Informed consent: discussed and consent obtained   Timeout:  patient name, date of birth, surgical site, and procedure verified Lesion destroyed using liquid nitrogen: Yes   Region frozen until ice ball extended beyond lesion: Yes   Outcome: patient tolerated procedure well with no complications   Post-procedure details: wound care instructions given    Phlebitis, superficial R lower leg  Superficial - being cared for by his PCP with warm compresses and compression stockings  Scar conditions and fibrosis of skin R shoulder  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 Observe for recurrence. Call clinic for new or changing lesions.  Recommend  regular skin exams, daily broad-spectrum spf 30+ sunscreen use, and photoprotection.   No Lymphadenopathy   Lentigines - Scattered tan macules - Discussed due to sun exposure - Benign, observe - Call for any changes  Seborrheic Keratoses - Stuck-on, waxy, tan-brown papules and plaques  - Discussed benign etiology and prognosis. - Observe - Call for any changes  Melanocytic Nevi - Tan-brown and/or pink-flesh-colored symmetric macules and papules - Benign appearing on exam today - Observation - Call clinic for new or changing moles - Recommend daily use of broad spectrum spf 30+ sunscreen to sun-exposed areas.   Hemangiomas - Red papules - Discussed benign nature - Observe - Call for any changes  Actinic Damage - diffuse scaly erythematous macules with underlying dyspigmentation - Recommend daily broad spectrum sunscreen SPF 30+ to sun-exposed areas, reapply every 2 hours as needed.  - Call for new or changing lesions.  History of Basal Cell Carcinoma of the Skin - No evidence of recurrence today - Recommend regular full body skin exams - Recommend daily broad spectrum sunscreen SPF 30+ to sun-exposed areas, reapply every 2 hours as needed.  - Call if any new or changing lesions are noted between office visits  History of Melanoma - No evidence of recurrence today - No lymphadenopathy - Recommend regular full body skin exams - Recommend daily broad spectrum sunscreen SPF 30+ to sun-exposed areas, reapply every 2 hours as needed.  - Call if any new or changing lesions are noted between office visits  History of Squamous Cell Carcinoma of the Skin - No evidence of recurrence  today - No lymphadenopathy - Recommend regular full body skin exams - Recommend daily broad spectrum sunscreen SPF 30+ to sun-exposed areas, reapply every 2 hours as needed.  - Call if any new or changing lesions are noted between office visits  Varicose Veins - Dilated blue, purple or red veins  at the lower extremities - Reassured - These can be treated by sclerotherapy (a procedure to inject a medicine into the veins to make them disappear) if desired, but the treatment is not covered by insurance   Skin cancer screening performed today.  Return in about 1 year (around 08/06/2020) for TBSE.  Luther Redo, CMA, am acting as scribe for Sarina Ser, MD .  Documentation: I have reviewed the above documentation for accuracy and completeness, and I agree with the above.  Sarina Ser, MD

## 2019-08-11 ENCOUNTER — Encounter: Payer: Self-pay | Admitting: Dermatology

## 2019-08-21 ENCOUNTER — Encounter: Payer: Self-pay | Admitting: Emergency Medicine

## 2019-08-21 ENCOUNTER — Emergency Department: Payer: Medicare Other

## 2019-08-21 ENCOUNTER — Other Ambulatory Visit: Payer: Self-pay

## 2019-08-21 ENCOUNTER — Emergency Department
Admission: EM | Admit: 2019-08-21 | Discharge: 2019-08-21 | Disposition: A | Discharge status: Home / Self Care | Payer: Medicare Other | Attending: Emergency Medicine | Admitting: Emergency Medicine

## 2019-08-21 DIAGNOSIS — Z91010 Allergy to peanuts: Secondary | ICD-10-CM | POA: Insufficient documentation

## 2019-08-21 DIAGNOSIS — I110 Hypertensive heart disease with heart failure: Secondary | ICD-10-CM | POA: Insufficient documentation

## 2019-08-21 DIAGNOSIS — Z79899 Other long term (current) drug therapy: Secondary | ICD-10-CM | POA: Diagnosis not present

## 2019-08-21 DIAGNOSIS — I509 Heart failure, unspecified: Secondary | ICD-10-CM | POA: Diagnosis not present

## 2019-08-21 DIAGNOSIS — Z85828 Personal history of other malignant neoplasm of skin: Secondary | ICD-10-CM | POA: Diagnosis not present

## 2019-08-21 DIAGNOSIS — M48062 Spinal stenosis, lumbar region with neurogenic claudication: Secondary | ICD-10-CM | POA: Diagnosis not present

## 2019-08-21 DIAGNOSIS — Z91040 Latex allergy status: Secondary | ICD-10-CM | POA: Diagnosis not present

## 2019-08-21 DIAGNOSIS — Z87891 Personal history of nicotine dependence: Secondary | ICD-10-CM | POA: Diagnosis not present

## 2019-08-21 DIAGNOSIS — Z7982 Long term (current) use of aspirin: Secondary | ICD-10-CM | POA: Diagnosis not present

## 2019-08-21 DIAGNOSIS — M545 Low back pain: Secondary | ICD-10-CM | POA: Diagnosis present

## 2019-08-21 LAB — CBC WITH DIFFERENTIAL/PLATELET
Abs Immature Granulocytes: 0.02 10*3/uL (ref 0.00–0.07)
Basophils Absolute: 0.1 10*3/uL (ref 0.0–0.1)
Basophils Relative: 1 %
Eosinophils Absolute: 0.1 10*3/uL (ref 0.0–0.5)
Eosinophils Relative: 2 %
HCT: 39.6 % (ref 39.0–52.0)
Hemoglobin: 14 g/dL (ref 13.0–17.0)
Immature Granulocytes: 0 %
Lymphocytes Relative: 15 %
Lymphs Abs: 0.9 10*3/uL (ref 0.7–4.0)
MCH: 31.9 pg (ref 26.0–34.0)
MCHC: 35.4 g/dL (ref 30.0–36.0)
MCV: 90.2 fL (ref 80.0–100.0)
Monocytes Absolute: 0.5 10*3/uL (ref 0.1–1.0)
Monocytes Relative: 7 %
Neutro Abs: 4.6 10*3/uL (ref 1.7–7.7)
Neutrophils Relative %: 75 %
Platelets: 217 10*3/uL (ref 150–400)
RBC: 4.39 MIL/uL (ref 4.22–5.81)
RDW: 12.4 % (ref 11.5–15.5)
WBC: 6.2 10*3/uL (ref 4.0–10.5)
nRBC: 0 % (ref 0.0–0.2)

## 2019-08-21 LAB — URINALYSIS, COMPLETE (UACMP) WITH MICROSCOPIC
Bacteria, UA: NONE SEEN
Bilirubin Urine: NEGATIVE
Glucose, UA: NEGATIVE mg/dL
Hgb urine dipstick: NEGATIVE
Ketones, ur: NEGATIVE mg/dL
Leukocytes,Ua: NEGATIVE
Nitrite: NEGATIVE
Protein, ur: NEGATIVE mg/dL
Specific Gravity, Urine: 1.009 (ref 1.005–1.030)
Squamous Epithelial / HPF: NONE SEEN (ref 0–5)
pH: 8 (ref 5.0–8.0)

## 2019-08-21 LAB — BASIC METABOLIC PANEL
Anion gap: 9 (ref 5–15)
BUN: 22 mg/dL (ref 8–23)
CO2: 25 mmol/L (ref 22–32)
Calcium: 9.8 mg/dL (ref 8.9–10.3)
Chloride: 100 mmol/L (ref 98–111)
Creatinine, Ser: 0.91 mg/dL (ref 0.61–1.24)
GFR calc Af Amer: >60 mL/min (ref >60)
GFR calc non Af Amer: >60 mL/min (ref >60)
Glucose, Bld: 108 mg/dL — ABNORMAL HIGH (ref 70–99)
Potassium: 4.5 mmol/L (ref 3.5–5.1)
Sodium: 134 mmol/L — ABNORMAL LOW (ref 135–145)

## 2019-08-21 MED ORDER — TRAMADOL HCL 50 MG PO TABS
50.0000 mg | ORAL_TABLET | Freq: Once | ORAL | Status: AC
  Administered 2019-08-21: 50 mg via ORAL
  Filled 2019-08-21: qty 1

## 2019-08-21 MED ORDER — DEXAMETHASONE SODIUM PHOSPHATE 10 MG/ML IJ SOLN
10.0000 mg | Freq: Once | INTRAMUSCULAR | Status: AC
  Administered 2019-08-21: 10 mg via INTRAVENOUS
  Filled 2019-08-21: qty 1

## 2019-08-21 NOTE — ED Notes (Signed)
UA collected by this RN, pt given phone for MRI screening.

## 2019-08-21 NOTE — ED Notes (Signed)
Neurosurgery at bedside at this time

## 2019-08-21 NOTE — Discharge Instructions (Addendum)
DO NOT take your Aspirin or Krill Order  Do not eat/drink after midnight  Dr. Jonathon Jordan office will call to set up admission tomorrow

## 2019-08-21 NOTE — ED Provider Notes (Signed)
[facility] Emergency Department Provider Note  ____________________________________________   First MD Initiated Contact with Patient 08/21/19 1205     (approximate)  I have reviewed the triage vital signs and the nursing notes.   HISTORY  Chief Complaint Back Pain    HPI Wesley Young is a 79 y.o. male  Here with lower back pain. Pt reports that he has known severe lumbar degenerative disease, followed by Dr. Lacinda Axon. He had an MRI in may 2021 that showed possible cauda impingement. He has had increasing intermittent LE weakness, pain since then and over the past week, has developed some bowel incontinence as well as urinary urgency. He has almost fallen multiple times 2/2 his weakness. He denies any new trauma or injuries. No fever, chills. He had severe weakness this AM so called his NSGY office, who advised him to come to the ED. No fever, chills, weight loss, night sweats. No other complaints.         Past Medical History:  Diagnosis Date  . Abnormal PSA    s/p post prostate biopsy in the past which was negative  . Alcoholism ([place])    with prolonged hospitalization 2009 for withdrawal c/b aspiration pneumonia requiring vent  . Anxiety   . B12 deficiency   . Cardiomyopathy ([place])    mild probably multifactorial secondary to HTn and possible alcohol contribution. Left ventricular ejection fraction app 45 %  . CHF (congestive heart failure) ([place])   . Dental bridge present    "[place]" bridge, top - right  . Depression   . Duodenitis   . Dyspnea    on exertion  . Dysrhythmia   . Erosive esophagitis   . Esophageal varices ([place])   . Gross hematuria   . Hx of basal cell carcinoma 2009   multiple sites  . Hx of dysplastic nevus 2005   multiple sites  . 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 Hx of squamous cell carcinoma of skin 2014   multiple sites  . Hypertension   . 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 Sleep  apnea   . Spinal stenosis     There are no problems to display for this patient.   Past Surgical History:  Procedure Laterality Date  . CATARACT EXTRACTION W/PHACO Left 03/12/2019   Procedure: CATARACT EXTRACTION PHACO AND INTRAOCULAR LENS PLACEMENT (IOC) LEFT 5.27 00:38.6;  Surgeon: Birder Robson, MD;  Location: [place];  Service: Ophthalmology;  Laterality: Left;  sleep apnea  . CATARACT EXTRACTION W/PHACO Right 04/09/2019   Procedure: CATARACT EXTRACTION PHACO AND INTRAOCULAR LENS PLACEMENT (IOC) RIGHT 4.84 00:52.4;  Surgeon: Birder Robson, MD;  Location: [place];  Service: Ophthalmology;  Laterality: Right;  . colonoscopy with polypectomy  12/2016  . COLONOSCOPY WITH PROPOFOL N/A 12/27/2016   Procedure: COLONOSCOPY WITH PROPOFOL;  Surgeon: Tol[place], Benay Pike, MD;  Location: [facility] ENDOSCOPY;  Service: Endoscopy;  Laterality: N/A;  . ESOPHAGOGASTRODUODENOSCOPY (EGD) WITH PROPOFOL N/A 09/21/2017   Procedure: ESOPHAGOGASTRODUODENOSCOPY (EGD) WITH PROPOFOL;  Surgeon: Lollie Sails, MD;  Location: [facility] ENDOSCOPY;  Service: Endoscopy;  Laterality: N/A;  . prostat biopsy x2    . s/p resection of his right deltoid muscle in 1977    . TONSILLECTOMY      Prior to Admission medications   Medication Sig Start Date End Date Taking? Authorizing Provider  Apoaequorin (PREVAGEN PO) Take by mouth daily.    [provider]  Ascorbic Acid (  VITAMIN C PO) Take 500 mg by mouth daily.    [provider]  aspirin EC 81 MG tablet Take 81 mg daily by mouth.    [provider]  Calcium Citrate-Vitamin D (CITRACAL + D PO) Take by mouth 2 (two) times daily.    [provider]  Cyanocobalamin (VITAMIN B-12 IJ) Inject as directed every 30 (thirty) days.    [provider]  enalapril (VASOTEC) 5 MG tablet Take 5 mg daily by mouth.    [provider]  finasteride (PROSCAR) 5 MG tablet Take 5 mg daily by mouth.    [provider]  KRILL OIL PO Take by mouth daily.    [provider]  Misc Natural Products (TART CHERRY ADVANCED PO) Take by mouth daily.    [provider]  omeprazole (PRILOSEC) 20 MG capsule Take 20 mg by mouth daily.    [provider]  polyethylene glycol (MIRALAX / GLYCOLAX) 17 g packet Take 17 g by mouth daily as needed.    [provider]  pramipexole (MIRAPEX) 0.5 MG tablet Take 0.5 mg 3 (three) times daily by mouth.    [provider]  Probiotic Product (ALIGN PO) Take daily by mouth.    [provider]  tamsulosin (FLOMAX) 0.4 MG CAPS capsule Take 0.4 mg by mouth daily.    [provider]  traMADol (ULTRAM) 50 MG tablet Take 50 mg every 6 (six) hours as needed by mouth.    [provider]    Allergies Benadryl [diphenhydramine], Crestor [rosuvastatin calcium], Nsaids, Peanut-containing drug products, Rapaflo [silodosin], Statins, Zetia [ezetimibe], Latex, and Neosporin [neomycin-bacitracin zn-polymyx]  History reviewed. No pertinent family history.  Social History Social History   Tobacco Use  . Smoking status: Former Smoker    Quit date: 1977    Years since quitting: 44.5  . Smokeless tobacco: Never Used  Vaping Use  . Vaping Use: Never used  Substance Use Topics  . Alcohol use: Not Currently    Comment: quit 03/26/2008  . Drug use: Not Currently    Review of Systems  Review of Systems  Constitutional: Negative for chills, fatigue and fever.  HENT: Negative for sore throat.   Respiratory: Negative for shortness of breath.   Cardiovascular: Negative for chest pain.  Gastrointestinal: Negative for abdominal pain.  Genitourinary: Negative for flank pain.  Musculoskeletal: Positive for back pain. Negative for neck pain.  Skin: Negative for rash and wound.  Allergic/Immunologic: Negative for immunocompromised state.  Neurological: Positive for weakness and numbness.  Hematological: Does not  bruise/bleed easily.  All other systems reviewed and are negative.    ____________________________________________  PHYSICAL EXAM:      VITAL SIGNS: ED Triage Vitals  Enc Vitals Group     BP 08/21/19 1129 (!) 153/80     Pulse Rate 08/21/19 1129 63     Resp 08/21/19 1129 18     Temp 08/21/19 1129 99.3 F (37.4 C)     Temp Source 08/21/19 1129 Oral     SpO2 08/21/19 1129 98 %     Weight 08/21/19 1125 178 lb (80.7 kg)     Height 08/21/19 1125 6' (1.829 m)     Head Circumference --      Peak Flow --      Pain Score 08/21/19 1124 5     Pain Loc --      Pain Edu? --      Excl. in [place]? --  Physical Exam Vitals and nursing note reviewed.  Constitutional:      General: He is not in acute distress.    Appearance: He is well-developed.  HENT:     Head: Normocephalic and atraumatic.  Eyes:     Conjunctiva/sclera: Conjunctivae normal.  Cardiovascular:     Rate and Rhythm: Normal rate and regular rhythm.     Heart sounds: Normal heart sounds.  Pulmonary:     Effort: Pulmonary effort is normal. No respiratory distress.     Breath sounds: No wheezing.  Abdominal:     General: There is no distension.  Musculoskeletal:     Cervical back: Neck supple.  Skin:    General: Skin is warm.     Capillary Refill: Capillary refill takes less than 2 seconds.     Findings: No rash.  Neurological:     Mental Status: He is alert and oriented to person, place, and time.     Motor: No abnormal muscle tone.      Spine Exam: Inspection/Palpation: Moderate midline and paraspinal lower lumbar TTP Strength: 5/5 throughout LE bilaterally (hip flexion/extension, adduction/abduction; knee flexion/extension; foot dorsiflexion/plantarflexion, inversion/eversion; great toe inversion) Sensation: Intact to light touch in proximal and distal LE bilaterally Reflexes: 1+ quadriceps and achilles reflexes   ____________________________________________   LABS (all labs ordered are listed, but only  abnormal results are displayed)  Labs Reviewed  BASIC METABOLIC PANEL - Abnormal; Notable for the following components:      Result Value   Sodium 134 (*)    Glucose, Bld 108 (*)    All other components within normal limits  URINALYSIS, COMPLETE (UACMP) WITH MICROSCOPIC - Abnormal; Notable for the following components:   Color, Urine STRAW (*)    APPearance HAZY (*)    All other components within normal limits  CBC WITH DIFFERENTIAL/PLATELET    ____________________________________________  EKG: None ________________________________________  RADIOLOGY All imaging, including plain films, CT scans, and ultrasounds, independently reviewed by me, and interpretations confirmed via formal radiology reads.  ED MD interpretation:   MR Lumbar: No significant change from 06/26/19  Official radiology report(s): MR LUMBAR SPINE WO CONTRAST  Result Date: 08/21/2019 CLINICAL DATA:  Low back pain. Cauda equina syndrome. Loss of bowel restrained. Leg weakness. EXAM: MRI LUMBAR SPINE WITHOUT CONTRAST TECHNIQUE: Multiplanar, multisequence MR imaging of the lumbar spine was performed. No intravenous contrast was administered. COMPARISON:  06/26/2019 FINDINGS: Segmentation: 5 lumbar type vertebral bodies as numbered previously. Alignment: Chronic anterolisthesis at L3-4 of 5 mm. Chronic anterolisthesis at L4-5 of 1 cm. Vertebrae: Chronic fusion at the L2-3 and L5-S1 level. Discogenic endplate edematous changes at L1-2 could contribute to back pain. Conus medullaris and cauda equina: Conus extends to the L1 level. Conus and cauda equina appear normal. Paraspinal and other soft tissues: Negative Disc levels: T11-12 and T12-L1: Normal. L1-2: Chronic disc degeneration with endplate osteophytes and bulging of the disc. Facet and ligamentous hypertrophy. Moderate multifactorial spinal stenosis which could be symptomatic. No significant change since the previous study. L2-3: Chronic fusion.  Sufficient patency of the  canal and foramina. L3-4: Chronic facet arthropathy with 5 mm of anterolisthesis. Endplate osteophytes and bulging of the disc. Severe multifactorial spinal stenosis likely to cause neural compression. L4-5: Chronic facet arthropathy with anterolisthesis of 1 cm. Endplate osteophytes and shallow disc protrusion. Severe multifactorial spinal stenosis likely to cause neural compression. L5-S1: Chronic fusion. Sufficient patency of the canal and foramina. IMPRESSION: No significant change since 06/26/2019. Severe multifactorial spinal stenosis at L3-4 and  L4-5 as described above, likely to cause neural compression. Chronic fusion at L2-3 and L5-S1 with sufficient patency of the canal and foramina. Moderate multifactorial spinal stenosis at L1-2 which could possibly contribute to symptoms. Discogenic endplate edema at that level which could contribute to back pain. Electronically Signed   By: Nelson Chimes M.D.   On: 08/21/2019 15:43    ____________________________________________  PROCEDURES   Procedure(s) performed (including Critical Care):  Procedures  ____________________________________________  INITIAL IMPRESSION / MDM / [place] / ED COURSE  As part of my medical decision making, I reviewed the following data within the [facility] notes reviewed and incorporated, Old chart reviewed, Notes from prior ED visits, and [place] Controlled Substance Database       *SEIBERT KEETER was evaluated in Emergency Department on 08/21/2019 for the symptoms described in the history of present illness. He was evaluated in the context of the global COVID-19 pandemic, which necessitated consideration that the patient might be at risk for infection with the SARS-CoV-2 virus that causes COVID-19. Institutional protocols and algorithms that pertain to the evaluation of patients at risk for COVID-19 are in a state of rapid change based on information released by regulatory bodies  including the CDC and federal and state organizations. These policies and algorithms were followed during the patient's care in the ED.  Some ED evaluations and interventions may be delayed as a result of limited staffing during the pandemic.*     Medical Decision Making:  79 yo M here with worsening LE weakness, intermittent bowel/bladder incontinence, and gait problems. Interestingly, he has severe central stenosis but sx are intermittent, and have resolved largely on my assessment with overall reassuring examination. Dr. Izora Ribas of [place] consulted and MRI obtained. Please see NSGY notes. Pt will likely need surgical fixation/repair but no signs of cord edema or severe compression at this time. He will be discharged with plan for likely OR tomorrow. Decadron given.   ____________________________________________  FINAL CLINICAL IMPRESSION(S) / ED DIAGNOSES  Final diagnoses:  Spinal stenosis of lumbar region with neurogenic claudication     MEDICATIONS GIVEN DURING THIS VISIT:  Medications  traMADol (ULTRAM) tablet 50 mg (50 mg Oral Given 08/21/19 1304)  dexamethasone (DECADRON) injection 10 mg (10 mg Intravenous Given 08/21/19 1304)     ED Discharge Orders    None       Note:  This document was prepared using Dragon voice recognition software and may include unintentional dictation errors.   Duffy Bruce, MD 08/21/19 2257

## 2019-08-21 NOTE — ED Triage Notes (Signed)
Pt here for low back pain and loss bowel.  Reports pain has increased from baseline, now having worsening weakness in legs, and tingling in both legs. Legs gave out last week.  Bowel incontinence started two weeks ago.  Still has urinary continence.  MRI from may shows lumbar stenosis and possible cauda equina root impingement.

## 2019-08-21 NOTE — ED Notes (Signed)
Pt transported to MRI at this time 

## 2019-08-21 NOTE — Consult Note (Signed)
Referring Physician:  No referring provider defined for this encounter.  Primary Physician:  Kirk Ruths, MD  Chief Complaint:  Low back pain, incontinence of bowel x 1 week  History of Present Illness: Wesley Young is a 79 y.o. male previously seen at the Neurosurgery clinic by Dr. Lacinda Axon for complaints of lower back pain and known severe lumbar degenerative disease.  Per Dr. Jonathon Jordan note on 07/02/2019:  "ongoing symptoms of back pain and some symptoms in his legs when he stands.  He states that this has been going on for many years but maybe is worsened over the past year.  He has been seeing Dr. Sharlet Salina and had injections in the remote past which did seem to help for short time.  He is also been to physical therapy previously.  He states that the worst pain is in the back when he sits up in the morning but it does improve throughout the day.  He does take tramadol for pain.  He states that when he stands he will sometimes feel some weakness in his legs in the back and on the sides of the calves but this goes away and he does not notice any difficulty with walking after this.  He additionally states that he gets dizzy sometimes when he stands up and needs a minute before he can start walking.  He is very active and does a lot of yard work.  He does not endorse any persistent numbness but he does state he gets some tingling sometimes in his feet."  At his recent appointment with Dr. Lacinda Axon, he was noted to have an intact upper and lower extremity motor exam as well as intact sensation to extremities.   He endorses intermittent urinary retention and per wife has some episodes of incontinence (baseline for him). He is on Flomax and Proscar for the complaints.  He called clinic today to report that he has had approximately one week of "loose stools" and feels that he is having worsening difficulty with lower extremity pain and subjective saddle anesthesia.  He says that he cannot feel the  sensation of needing to have a BM, and also, does not feel the presence of stool in his rectum, or able to feel when he wipes. He reports multiple episodes of stool incontinence.   He was advised to come to the ED for evaluation given these symptoms.   A PVR bladder scan completed in the ED did show urinary retention.  Wesley Young has no symptoms of cervical myelopathy.   Review of Systems:  A 10 point review of systems is negative, except for the pertinent positives and negatives detailed in the HPI.  Past Medical History: Past Medical History:  Diagnosis Date  . Abnormal PSA    s/p post prostate biopsy in the past which was negative  . Alcoholism ([place])    with prolonged hospitalization 2009 for withdrawal c/b aspiration pneumonia requiring vent  . Anxiety   . B12 deficiency   . Cardiomyopathy ([place])    mild probably multifactorial secondary to HTn and possible alcohol contribution. Left ventricular ejection fraction app 45 %  . CHF (congestive heart failure) ([place])   . Dental bridge present    "[place]" bridge, top - right  . Depression   . Duodenitis   . Dyspnea    on exertion  . Dysrhythmia   . Erosive esophagitis   . Esophageal varices ([place])   . Gross hematuria   . Hx of basal cell  carcinoma 2009   multiple sites  . Hx of dysplastic nevus 2005   multiple sites  . 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 Hx of squamous cell carcinoma of skin 2014   multiple sites  . Hypertension   . 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 Sleep apnea   . Spinal stenosis     Past Surgical History: Past Surgical History:  Procedure Laterality Date  . CATARACT EXTRACTION W/PHACO Left 03/12/2019   Procedure: CATARACT EXTRACTION PHACO AND INTRAOCULAR LENS PLACEMENT (IOC) LEFT 5.27 00:38.6;  Surgeon: Birder Robson, MD;  Location: [place];  Service: Ophthalmology;  Laterality: Left;  sleep apnea  . CATARACT EXTRACTION W/PHACO Right 04/09/2019    Procedure: CATARACT EXTRACTION PHACO AND INTRAOCULAR LENS PLACEMENT (IOC) RIGHT 4.84 00:52.4;  Surgeon: Birder Robson, MD;  Location: [place];  Service: Ophthalmology;  Laterality: Right;  . colonoscopy with polypectomy  12/2016  . COLONOSCOPY WITH PROPOFOL N/A 12/27/2016   Procedure: COLONOSCOPY WITH PROPOFOL;  Surgeon: Toledo, Benay Pike, MD;  Location: [facility] ENDOSCOPY;  Service: Endoscopy;  Laterality: N/A;  . ESOPHAGOGASTRODUODENOSCOPY (EGD) WITH PROPOFOL N/A 09/21/2017   Procedure: ESOPHAGOGASTRODUODENOSCOPY (EGD) WITH PROPOFOL;  Surgeon: Lollie Sails, MD;  Location: [facility] ENDOSCOPY;  Service: Endoscopy;  Laterality: N/A;  . prostat biopsy x2    . s/p resection of his right deltoid muscle in 1977    . TONSILLECTOMY      Allergies: Allergies as of 08/21/2019 - Review Complete 08/21/2019  Allergen Reaction Noted  . Benadryl [diphenhydramine]  08/07/2019  . Crestor [rosuvastatin calcium]  09/20/2017  . Nsaids  09/20/2017  . Peanut-containing drug products Swelling 08/21/2019  . Rapaflo [silodosin]  09/20/2017  . Statins Other (See Comments) 12/27/2016  . Zetia [ezetimibe]  09/20/2017  . Latex Rash 12/27/2016  . Neosporin [neomycin-bacitracin zn-polymyx] Rash 12/27/2016    Medications: No current facility-administered medications for this encounter.  Current Outpatient Medications:  .  Apoaequorin (PREVAGEN PO), Take by mouth daily., Disp: , Rfl:  .  Ascorbic Acid (VITAMIN C PO), Take 500 mg by mouth daily., Disp: , Rfl:  .  aspirin EC 81 MG tablet, Take 81 mg daily by mouth., Disp: , Rfl:  .  Calcium Citrate-Vitamin D (CITRACAL + D PO), Take by mouth 2 (two) times daily., Disp: , Rfl:  .  Cyanocobalamin (VITAMIN B-12 IJ), Inject as directed every 30 (thirty) days., Disp: , Rfl:  .  enalapril (VASOTEC) 5 MG tablet, Take 5 mg daily by mouth., Disp: , Rfl:  .  finasteride (PROSCAR) 5 MG tablet, Take 5 mg daily by mouth., Disp: , Rfl:  .  KRILL OIL PO, Take by mouth  daily., Disp: , Rfl:  .  Misc Natural Products (TART CHERRY ADVANCED PO), Take by mouth daily., Disp: , Rfl:  .  omeprazole (PRILOSEC) 20 MG capsule, Take 20 mg by mouth daily., Disp: , Rfl:  .  polyethylene glycol (MIRALAX / GLYCOLAX) 17 g packet, Take 17 g by mouth daily as needed., Disp: , Rfl:  .  pramipexole (MIRAPEX) 0.5 MG tablet, Take 0.5 mg 3 (three) times daily by mouth., Disp: , Rfl:  .  Probiotic Product (ALIGN PO), Take daily by mouth., Disp: , Rfl:  .  tamsulosin (FLOMAX) 0.4 MG CAPS capsule, Take 0.4 mg by mouth daily., Disp: , Rfl:  .  traMADol (ULTRAM) 50 MG tablet, Take 50 mg every 6 (six) hours as needed by mouth., Disp: , Rfl:  Social History: Social History   Tobacco Use  . Smoking status: Former Smoker    Quit date: 1977    Years since quitting: 44.5  . Smokeless tobacco: Never Used  Vaping Use  . Vaping Use: Never used  Substance Use Topics  . Alcohol use: Not Currently    Comment: quit 03/26/2008  . Drug use: Not Currently    Family Medical History: History reviewed. No pertinent family history.  Physical Examination: Vitals:   08/21/19 1809 08/21/19 1830  BP: (!) 172/92 (!) 154/93  Pulse: 81 82  Resp: 20 20  Temp:    SpO2: 100%      General: Patient is well developed, well nourished, calm, collected, and in no apparent distress.  Psychiatric: Patient is anxious.  Head:  Pupils equal, round, and reactive to light.  Respiratory: Patient is breathing without any difficulty.  Extremities: No edema.   NEUROLOGICAL:  General: In no acute distress.   Awake, alert, oriented to person, place, and time.  Pupils equal round and reactive to light.  Facial tone is symmetric.  Tongue protrusion is midline.    ROM of spine: Full, but slowed ROM due to pain and reports of "stiffness"  Strength: Side Biceps Triceps Deltoid Interossei Grip  R 5 5 5 5 5   L 5 5 3  (prior injury and  surgery and this is patients baseline motor function to LUE) 5 5    Side Iliopsoas Quads Hamstring PF DF EHL  R 5 5 5 5 5 5   L 5 5 5 5 5 5    Reflexes are 1+ and symmetric at the patella and achilles.   Bilateral upper and lower extremity sensation is intact to light touch.  SILT over buttocks. Rectal exam deferred.  Clonus is not present.   Gait is wide based, walks with cane.  Unable to perform tandem gait. Hoffman's is absent.  Imaging: EXAM: MRI LUMBAR SPINE WITHOUT CONTRAST 08/21/2019  TECHNIQUE: Multiplanar, multisequence MR imaging of the lumbar spine was performed. No intravenous contrast was administered.  COMPARISON:  06/26/2019  FINDINGS: Segmentation: 5 lumbar type vertebral bodies as numbered previously.  Alignment: Chronic anterolisthesis at L3-4 of 5 mm. Chronic anterolisthesis at L4-5 of 1 cm.  Vertebrae: Chronic fusion at the L2-3 and L5-S1 level. Discogenic endplate edematous changes at L1-2 could contribute to back pain.  Conus medullaris and cauda equina: Conus extends to the L1 level. Conus and cauda equina appear normal.  Paraspinal and other soft tissues: Negative  Disc levels:  T11-12 and T12-L1: Normal.  L1-2: Chronic disc degeneration with endplate osteophytes and bulging of the disc. Facet and ligamentous hypertrophy. Moderate multifactorial spinal stenosis which could be symptomatic. No significant change since the previous study.  L2-3: Chronic fusion.  Sufficient patency of the canal and foramina.  L3-4: Chronic facet arthropathy with 5 mm of anterolisthesis. Endplate osteophytes and bulging of the disc. Severe multifactorial spinal stenosis likely to cause neural compression.  L4-5: Chronic facet arthropathy with anterolisthesis of 1 cm. Endplate osteophytes and shallow disc protrusion. Severe multifactorial spinal stenosis likely to cause neural compression.  L5-S1: Chronic fusion. Sufficient patency of the canal and foramina.   Assessment and Plan: Mr. Girgis is a pleasant 79 y.o.  male with spinal stenosis who presents to the ED for approximately one week of loose stools and reported bowel incontinence.  Imaging and my patient exam have been reviewed by Dr. Izora Ribas and Dr. Lacinda Axon.   - We will plan to direct admit patient to  [facility] tomorrow for tentative surgical intervention this week - Pt has been instructed to stop ASA and Krill oil - Acceptable for discharge from [facility] ED if no medical concerns  Lonell Face, NP Dept. of Neurosurgery

## 2019-08-21 NOTE — ED Notes (Signed)
NAD noted at time of D/C. Pt denies questions or concerns. Pt ambulatory to the lobby at this time. Verbal consent for D/C obtained at this time. Pt ambulatory to the lobby at this time.

## 2019-08-21 NOTE — ED Notes (Signed)
Pt continues to sit on side of bed, pt and wife updated regarding plan of care at this time. Pt and wife state understanding at this time.

## 2019-08-21 NOTE — ED Notes (Signed)
Pt visualized ambulatory without difficulty at this time to bathroom. Pt ambulatory back to bed at this time. Pt's wife at bedside, awaiting MRI to come get patient at this time.

## 2019-08-21 NOTE — ED Notes (Addendum)
Pt presents to ED via POV, states had MRI on 5/11. Pt states approx 1.5 weeks ago pt was walking and his legs gave out and he had to grab onto a kitchen island until strength returned to his legs, pt states similar episode earlier today. Pt states feels like he is sitting on a saddle at this time. Pt states feels numbness/tingling to his legs, pt also reports increased loss of bowel control, pt states symptoms have been ongoing. Pt also reports taking tramadol q 6 hrs for pain that has been gradually worsening "over the years". Pt states was told by Dr. Lacinda Axon "if it ever progressed to this point to come on in".   Pt visualized in NAD, sitting on side of bed, states is comfortable sitting on side of bed at this time.

## 2019-08-21 NOTE — ED Notes (Signed)
Pain reassessed by this RN. Pt visualized in NAD, resting in bed with wife at bedside doing a crossword puzzle. Pt denies any needs at this time, inquiring about wait time for MRI, this RN explained according to MRI 1 patient ahead of him. Pt denies any needs at this time.

## 2019-10-07 ENCOUNTER — Encounter: Payer: Self-pay | Admitting: Urology

## 2019-10-07 ENCOUNTER — Ambulatory Visit (INDEPENDENT_AMBULATORY_CARE_PROVIDER_SITE_OTHER): Payer: Medicare Other | Admitting: Urology

## 2019-10-07 ENCOUNTER — Other Ambulatory Visit: Payer: Self-pay

## 2019-10-07 VITALS — BP 170/83 | HR 80 | Ht 72.0 in | Wt 184.0 lb

## 2019-10-07 DIAGNOSIS — N3001 Acute cystitis with hematuria: Secondary | ICD-10-CM | POA: Diagnosis not present

## 2019-10-07 DIAGNOSIS — R310 Gross hematuria: Secondary | ICD-10-CM | POA: Diagnosis not present

## 2019-10-07 DIAGNOSIS — N200 Calculus of kidney: Secondary | ICD-10-CM | POA: Diagnosis not present

## 2019-10-07 DIAGNOSIS — N401 Enlarged prostate with lower urinary tract symptoms: Secondary | ICD-10-CM

## 2019-10-07 DIAGNOSIS — N138 Other obstructive and reflux uropathy: Secondary | ICD-10-CM

## 2019-10-07 LAB — MICROSCOPIC EXAMINATION
Bacteria, UA: NONE SEEN
Epithelial Cells (non renal): NONE SEEN /hpf (ref 0–10)
RBC, Urine: NONE SEEN /hpf (ref 0–2)

## 2019-10-07 LAB — URINALYSIS, COMPLETE
Bilirubin, UA: NEGATIVE
Glucose, UA: NEGATIVE
Ketones, UA: NEGATIVE
Leukocytes,UA: NEGATIVE
Nitrite, UA: NEGATIVE
Protein,UA: NEGATIVE
RBC, UA: NEGATIVE
Specific Gravity, UA: 1.015 (ref 1.005–1.030)
Urobilinogen, Ur: 0.2 mg/dL (ref 0.2–1.0)
pH, UA: 7.5 (ref 5.0–7.5)

## 2019-10-07 NOTE — Progress Notes (Signed)
10/07/19 11:54 AM   Wesley Young [birth year] [id number]  CC: Gross hematuria, BPH, kidney stones  HPI: I saw Wesley Young in urology clinic today for evaluation of gross hematuria.  He is an 79 year old male who reports multiple episodes of gross hematuria with light red urine about a week ago.  He was seen by his PCP and gave a urine sample that showed dipstick positive blood, and a urine culture was sent, and he was started on Bactrim antibiotics.  His urine culture returned showing 25-50k beta-hemolytic Streptococcus group B.  He reports his gross hematuria stopped within 24 hours of starting the antibiotics.  He also passed a kidney stone about 2 to 3 weeks ago.  He has baseline urinary symptoms on maximal medical therapy with finasteride and Flomax, but is minimally bothered.  He has a distant history of urinary retention when he was an alcoholic.  He denies any history of recurrent UTIs, recent history of retention.  Urinalysis today is benign with 0-5 WBCs, 0 RBCs, no bacteria, nitrite negative, no leukocytes.   He has a very distant smoking history when he smoked a pipe for about 20 years but quit 40 years ago.  He previously worked as a Char[employer] fundraiser, but denies any exposures to chemicals or dyes.  He is on maximal medical therapy for BPH.  PMH: Past Medical History:  Diagnosis Date  . Abnormal PSA    s/p post prostate biopsy in the past which was negative  . Alcoholism ([place])    with prolonged hospitalization 2009 for withdrawal c/b aspiration pneumonia requiring vent  . Anxiety   . B12 deficiency   . Cardiomyopathy ([place])    mild probably multifactorial secondary to HTn and possible alcohol contribution. Left ventricular ejection fraction app 45 %  . CHF (congestive heart failure) ([place])   . Dental bridge present    "[place]" bridge, top - right  . Depression   . Duodenitis   . Dyspnea    on exertion  . Dysrhythmia   . Erosive esophagitis   . Esophageal varices ([place])     . Gross hematuria   . Hx of basal cell carcinoma 2009   multiple sites  . Hx of dysplastic nevus 2005   multiple sites  . 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 Hx of squamous cell carcinoma of skin 2014   multiple sites  . Hypertension   . 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 Sleep apnea   . Spinal stenosis     Surgical History: Past Surgical History:  Procedure Laterality Date  . CATARACT EXTRACTION W/PHACO Left 03/12/2019   Procedure: CATARACT EXTRACTION PHACO AND INTRAOCULAR LENS PLACEMENT (IOC) LEFT 5.27 00:38.6;  Surgeon: Birder Robson, MD;  Location: [place];  Service: Ophthalmology;  Laterality: Left;  sleep apnea  . CATARACT EXTRACTION W/PHACO Right 04/09/2019   Procedure: CATARACT EXTRACTION PHACO AND INTRAOCULAR LENS PLACEMENT (IOC) RIGHT 4.84 00:52.4;  Surgeon: Birder Robson, MD;  Location: [place];  Service: Ophthalmology;  Laterality: Right;  . colonoscopy with polypectomy  12/2016  . COLONOSCOPY WITH PROPOFOL N/A 12/27/2016   Procedure: COLONOSCOPY WITH PROPOFOL;  Surgeon: Toledo, Benay Pike, MD;  Location: [facility] ENDOSCOPY;  Service: Endoscopy;  Laterality: N/A;  . ESOPHAGOGASTRODUODENOSCOPY (EGD) WITH PROPOFOL N/A 09/21/2017   Procedure: ESOPHAGOGASTRODUODENOSCOPY (EGD) WITH PROPOFOL;  Surgeon: Lollie Sails, MD;  Location: [facility] ENDOSCOPY;  Service: Endoscopy;  Laterality: N/A;  . prostat biopsy x2    .  s/p resection of his right deltoid muscle in 1977    . TONSILLECTOMY     Social History:  reports that he quit smoking about 44 years ago. He has never used smokeless tobacco. He reports previous alcohol use. He reports previous drug use.  Physical Exam: BP (!) 170/83   Pulse 80   Ht 6' (1.829 m)   Wt 184 lb (83.5 kg)   BMI 24.95 kg/m    Constitutional:  Alert and oriented, No acute distress. Cardiovascular: No clubbing, cyanosis, or edema. Respiratory: Normal respiratory effort, no increased work of  breathing. GI: Abdomen is soft, nontender, nondistended, no abdominal masses   Laboratory Data: Reviewed, see HPI  Pertinent Imaging: None to review  Assessment & Plan:   In summary, he is an 79 year old male with recent gross hematuria and urine culture showing UTI with Streptococcus.  He has been improving on Bactrim and his gross hematuria has resolved.  He also recently passed a kidney stone.  Urinalysis is benign today.  He is on maximal medical therapy for BPH with finasteride and Flomax.  We discussed possible etiologies of hematuria, and I suspect with his positive urine culture was related to UTI, which is since resolved with antibiotic treatment.  -Continue Bactrim for acute UTI -I recommended close follow-up in 6 weeks with a PVR and repeat urinalysis to confirm no residual microscopic hematuria.  If PVR significantly elevated or recurrent UTIs, or refractory BPH symptoms, consider transrectal ultrasound and cystoscopy for evaluation of an outlet procedure in the future   Nickolas Madrid, MD 10/07/2019  [facility] [address] [place], [place] [address] [phone]

## 2019-11-11 ENCOUNTER — Other Ambulatory Visit: Payer: Self-pay | Admitting: Nurse Practitioner

## 2019-11-11 ENCOUNTER — Other Ambulatory Visit: Payer: Self-pay

## 2019-11-11 ENCOUNTER — Ambulatory Visit
Admission: RE | Admit: 2019-11-11 | Discharge: 2019-11-11 | Disposition: A | Discharge status: Home / Self Care | Payer: Medicare Other | Source: Ambulatory Visit | Attending: Nurse Practitioner | Admitting: Nurse Practitioner

## 2019-11-11 DIAGNOSIS — R29898 Other symptoms and signs involving the musculoskeletal system: Secondary | ICD-10-CM | POA: Insufficient documentation

## 2019-11-11 DIAGNOSIS — M48062 Spinal stenosis, lumbar region with neurogenic claudication: Secondary | ICD-10-CM

## 2019-11-11 DIAGNOSIS — G834 Cauda equina syndrome: Secondary | ICD-10-CM | POA: Insufficient documentation

## 2019-11-20 ENCOUNTER — Ambulatory Visit (INDEPENDENT_AMBULATORY_CARE_PROVIDER_SITE_OTHER): Payer: Medicare Other | Admitting: Urology

## 2019-11-20 ENCOUNTER — Other Ambulatory Visit: Payer: Self-pay

## 2019-11-20 ENCOUNTER — Encounter: Payer: Self-pay | Admitting: Urology

## 2019-11-20 VITALS — BP 163/78 | HR 47 | Ht 72.0 in | Wt 190.0 lb

## 2019-11-20 DIAGNOSIS — N401 Enlarged prostate with lower urinary tract symptoms: Secondary | ICD-10-CM | POA: Diagnosis not present

## 2019-11-20 DIAGNOSIS — R3121 Asymptomatic microscopic hematuria: Secondary | ICD-10-CM | POA: Diagnosis not present

## 2019-11-20 DIAGNOSIS — N138 Other obstructive and reflux uropathy: Secondary | ICD-10-CM

## 2019-11-20 LAB — BLADDER SCAN AMB NON-IMAGING: Scan Result: 67

## 2019-11-20 NOTE — Progress Notes (Signed)
° °  11/20/2019 9:08 AM   Wesley Young [birth year] [id number]  Reason for visit: Follow up nephrolithiasis, UTI, hematuria  HPI: I saw Mr. Stanbrough back in urology clinic for the above issues.  I saw him in August 2021 after he had recently passed a kidney stone as well as had a beta-hemolytic Streptococcus UTI and had around 1 week of gross hematuria that resolved within 24 hours of starting antibiotics for the UTI.  He has BPH that is well managed on Flomax and finasteride.  He has a distant history of urinary retention.  He is currently minimally bothered by his urinary symptoms with an IPSS score today of 11, with quality of life mostly satisfied.  PVR is normal today at 67 mL.  Urinalysis is notable for 3-10 RBCs, but otherwise benign.  We discussed his persistent microscopic hematuria today. We discussed common possible etiologies of microscopic hematuria including idiopathic, urolithiasis, medical renal disease, and malignancy. We discussed the new asymptomatic microscopic hematuria guidelines and risk categories of low, intermediate, and high risk that are based on age, risk factors like smoking, and degree of microscopic hematuria. We discussed work-up can range from repeat urinalysis, renal ultrasound and cystoscopy, to CT urogram and cystoscopy.  They fall into the high risk category, and I recommended proceeding with CT urogram and cystoscopy.  He would like to defer work-up at this time, and understands these risk.  He is amenable to close follow-up in 6 months with a repeat UA.  We discussed return precautions at length including worsening urinary symptoms, gross hematuria, or UTIs.  RTC 6 months with UA, IPSS, PVR   Billey Co, MD  [facility] [address], [place], [place] [address] [phone]

## 2019-11-20 NOTE — Addendum Note (Signed)
Addended by: Donalee Citrin on: 11/20/2019 11:36 AM   Modules accepted: Orders

## 2019-11-21 LAB — MICROSCOPIC EXAMINATION: Bacteria, UA: NONE SEEN

## 2019-11-21 LAB — URINALYSIS, COMPLETE
Bilirubin, UA: NEGATIVE
Glucose, UA: NEGATIVE
Ketones, UA: NEGATIVE
Leukocytes,UA: NEGATIVE
Nitrite, UA: NEGATIVE
Protein,UA: NEGATIVE
Specific Gravity, UA: 1.025 (ref 1.005–1.030)
Urobilinogen, Ur: 0.2 mg/dL (ref 0.2–1.0)
pH, UA: 6 (ref 5.0–7.5)

## 2020-03-19 ENCOUNTER — Other Ambulatory Visit
Admission: RE | Admit: 2020-03-19 | Discharge: 2020-03-19 | Disposition: A | Discharge status: Home / Self Care | Payer: Medicare Other | Source: Ambulatory Visit | Attending: Internal Medicine | Admitting: Internal Medicine

## 2020-03-19 ENCOUNTER — Other Ambulatory Visit: Payer: Self-pay

## 2020-03-19 DIAGNOSIS — Z20822 Contact with and (suspected) exposure to covid-19: Secondary | ICD-10-CM | POA: Insufficient documentation

## 2020-03-19 DIAGNOSIS — Z01812 Encounter for preprocedural laboratory examination: Secondary | ICD-10-CM | POA: Insufficient documentation

## 2020-03-19 LAB — SARS CORONAVIRUS 2 (TAT 6-24 HRS): SARS Coronavirus 2: NEGATIVE

## 2020-03-20 ENCOUNTER — Encounter: Payer: Self-pay | Admitting: Internal Medicine

## 2020-03-23 ENCOUNTER — Other Ambulatory Visit: Payer: Self-pay

## 2020-03-23 ENCOUNTER — Ambulatory Visit: Payer: Medicare Other | Admitting: Anesthesiology

## 2020-03-23 ENCOUNTER — Ambulatory Visit
Admission: RE | Admit: 2020-03-23 | Discharge: 2020-03-23 | Disposition: A | Discharge status: Home / Self Care | Payer: Medicare Other | Attending: Internal Medicine | Admitting: Internal Medicine

## 2020-03-23 ENCOUNTER — Encounter
Admission: RE | Disposition: A | Discharge status: Home / Self Care | Payer: Self-pay | Source: Home / Self Care | Attending: Internal Medicine

## 2020-03-23 ENCOUNTER — Encounter: Payer: Self-pay | Admitting: Internal Medicine

## 2020-03-23 DIAGNOSIS — K5730 Diverticulosis of large intestine without perforation or abscess without bleeding: Secondary | ICD-10-CM | POA: Diagnosis not present

## 2020-03-23 DIAGNOSIS — Z7982 Long term (current) use of aspirin: Secondary | ICD-10-CM | POA: Diagnosis not present

## 2020-03-23 DIAGNOSIS — Z79899 Other long term (current) drug therapy: Secondary | ICD-10-CM | POA: Diagnosis not present

## 2020-03-23 DIAGNOSIS — Z91040 Latex allergy status: Secondary | ICD-10-CM | POA: Diagnosis not present

## 2020-03-23 DIAGNOSIS — Z888 Allergy status to other drugs, medicaments and biological substances status: Secondary | ICD-10-CM | POA: Diagnosis not present

## 2020-03-23 DIAGNOSIS — D500 Iron deficiency anemia secondary to blood loss (chronic): Secondary | ICD-10-CM | POA: Insufficient documentation

## 2020-03-23 DIAGNOSIS — Z85828 Personal history of other malignant neoplasm of skin: Secondary | ICD-10-CM | POA: Diagnosis not present

## 2020-03-23 DIAGNOSIS — Z87891 Personal history of nicotine dependence: Secondary | ICD-10-CM | POA: Diagnosis not present

## 2020-03-23 DIAGNOSIS — Z886 Allergy status to analgesic agent status: Secondary | ICD-10-CM | POA: Insufficient documentation

## 2020-03-23 DIAGNOSIS — K2100 Gastro-esophageal reflux disease with esophagitis, without bleeding: Secondary | ICD-10-CM | POA: Insufficient documentation

## 2020-03-23 DIAGNOSIS — Z8719 Personal history of other diseases of the digestive system: Secondary | ICD-10-CM | POA: Diagnosis not present

## 2020-03-23 HISTORY — DX: Pure hypercholesterolemia, unspecified: E78.00

## 2020-03-23 HISTORY — PX: ESOPHAGOGASTRODUODENOSCOPY (EGD) WITH PROPOFOL: SHX5813

## 2020-03-23 HISTORY — DX: Other forms of dyspnea: R06.09

## 2020-03-23 HISTORY — DX: Malignant neoplasm of connective and soft tissue, unspecified: C49.9

## 2020-03-23 HISTORY — PX: COLONOSCOPY WITH PROPOFOL: SHX5780

## 2020-03-23 HISTORY — DX: Unspecified atrial fibrillation: I48.91

## 2020-03-23 HISTORY — DX: Asymptomatic varicose veins of bilateral lower extremities: I83.93

## 2020-03-23 HISTORY — DX: Dyspnea, unspecified: R06.00

## 2020-03-23 HISTORY — DX: Male erectile dysfunction, unspecified: N52.9

## 2020-03-23 SURGERY — COLONOSCOPY WITH PROPOFOL
Anesthesia: General

## 2020-03-23 MED ORDER — PROPOFOL 500 MG/50ML IV EMUL
INTRAVENOUS | Status: AC
  Filled 2020-03-23: qty 50

## 2020-03-23 MED ORDER — PROPOFOL 10 MG/ML IV BOLUS
INTRAVENOUS | Status: DC | PRN
  Administered 2020-03-23: 70 mg via INTRAVENOUS

## 2020-03-23 MED ORDER — LIDOCAINE HCL (PF) 2 % IJ SOLN
INTRAMUSCULAR | Status: AC
  Filled 2020-03-23: qty 5

## 2020-03-23 MED ORDER — LIDOCAINE HCL (CARDIAC) PF 100 MG/5ML IV SOSY
PREFILLED_SYRINGE | INTRAVENOUS | Status: DC | PRN
  Administered 2020-03-23: 50 mg via INTRAVENOUS

## 2020-03-23 MED ORDER — SODIUM CHLORIDE 0.9 % IV SOLN: INTRAVENOUS | Status: DC

## 2020-03-23 MED ORDER — PROPOFOL 500 MG/50ML IV EMUL
INTRAVENOUS | Status: DC | PRN
  Administered 2020-03-23: 150 ug/kg/min via INTRAVENOUS

## 2020-03-23 NOTE — Anesthesia Preprocedure Evaluation (Addendum)
Anesthesia Evaluation  Patient identified by MRN, date of birth, ID band Patient awake    Reviewed: Allergy & Precautions, NPO status , Patient's Chart, lab work & pertinent test results  History of Anesthesia Complications Negative for: history of anesthetic complications  Airway Mallampati: II       Dental   Pulmonary sleep apnea and Continuous Positive Airway Pressure Ventilation , neg COPD, Not current smoker, former smoker,           Cardiovascular hypertension, Pt. on medications +CHF (with EToH abuse, resolved)  (-) Past MI + dysrhythmias (irregular beats, but sinus) (-) Valvular Problems/Murmurs     Neuro/Psych neg Seizures Anxiety Depression    GI/Hepatic Neg liver ROS, PUD, GERD  Medicated,  Endo/Other  neg diabetes  Renal/GU negative Renal ROS     Musculoskeletal   Abdominal   Peds  Hematology   Anesthesia Other Findings   Reproductive/Obstetrics                            Anesthesia Physical Anesthesia Plan  ASA: III  Anesthesia Plan: General   Post-op Pain Management:    Induction: Intravenous  PONV Risk Score and Plan: 2 and Propofol infusion and TIVA  Airway Management Planned: Nasal Cannula  Additional Equipment:   Intra-op Plan:   Post-operative Plan:   Informed Consent: I have reviewed the patients History and Physical, chart, labs and discussed the procedure including the risks, benefits and alternatives for the proposed anesthesia with the patient or authorized representative who has indicated his/her understanding and acceptance.       Plan Discussed with:   Anesthesia Plan Comments:         Anesthesia Quick Evaluation

## 2020-03-23 NOTE — Op Note (Signed)
[facility] Patient Name: Wesley Young Procedure Date: 03/23/2020 11:23 AM MRN: [id number] Account #: [id number] Date of Birth: 12-Mar-1940 Admit Type: Outpatient Age: 80 Room: [facility] ENDO ROOM 2 Gender: Male Note Status: Finalized Procedure:             Upper GI endoscopy Indications:           Surveillance for malignancy due to personal history of                         Barrett's esophagus, Unexplained iron deficiency anemia Providers:             Benay Pike. [name] MD, MD Referring MD:          Ocie Cornfield. Ouida Sills MD, MD (Referring MD) Medicines:             Propofol per Anesthesia Complications:         No immediate complications. Procedure:             Pre-Anesthesia Assessment:                        - The risks and benefits of the procedure and the                         sedation options and risks were discussed with the                         patient. All questions were answered and informed                         consent was obtained.                        - Patient identification and proposed procedure were                         verified prior to the procedure by the nurse. The                         procedure was verified in the procedure room.                        - ASA Grade Assessment: III - A patient with severe                         systemic disease.                        - After reviewing the risks and benefits, the patient                         was deemed in satisfactory condition to undergo the                         procedure.                        After obtaining informed consent, the endoscope was  passed under direct vision. Throughout the procedure,                         the patient's blood pressure, pulse, and oxygen                         saturations were monitored continuously. The Endoscope                         was introduced through the mouth, and advanced to the                          third part of duodenum. The upper GI endoscopy was                         accomplished without difficulty. The patient tolerated                         the procedure well. Findings:      The Z-line was regular and was found at the gastroesophageal junction.       Mucosa was biopsied with a cold forceps for histology. One specimen       bottle was sent to pathology.      The stomach was normal.      The examined duodenum was normal. Impression:            - Z-line regular, at the gastroesophageal junction.                         Biopsied.                        - Normal stomach.                        - Normal examined duodenum. Recommendation:        - Await pathology results.                        - Proceed with colonoscopy Procedure Code(s):     --- Professional ---                        [phone], Esophagogastroduodenoscopy, flexible,                         transoral; with biopsy, single or multiple Diagnosis Code(s):     --- Professional ---                        D50.9, Iron deficiency anemia, unspecified                        K22.70, Barrett's esophagus without dysplasia CPT copyright 2019 American Medical Association. All rights reserved. The codes documented in this report are preliminary and upon coder review may  be revised to meet current compliance requirements. Efrain Sella MD, MD 03/23/2020 11:49:13 AM This report has been signed electronically. Number of Addenda: 0 Note Initiated On: 03/23/2020 11:23 AM Estimated Blood Loss:  Estimated blood loss: none.      [facility]

## 2020-03-23 NOTE — Transfer of Care (Signed)
Immediate Anesthesia Transfer of Care Note  Patient: Wesley Young  Procedure(s) Performed: COLONOSCOPY WITH PROPOFOL (N/A ) ESOPHAGOGASTRODUODENOSCOPY (EGD) WITH PROPOFOL (N/A )  Patient Location: PACU and Endoscopy Unit  Anesthesia Type:General  Level of Consciousness: drowsy  Airway & Oxygen Therapy: Patient Spontanous Breathing  Post-op Assessment: Report given to RN and Post -op Vital signs reviewed and stable  Post vital signs: Reviewed and stable  Last Vitals:  Vitals Value Taken Time  BP 109/67 03/23/2020 1210  Temp    Pulse 68   Resp    SpO2 100     Last Pain:  Vitals:   03/23/20 1045  TempSrc: Skin  PainSc: 0-No pain         Complications: No complications documented.

## 2020-03-23 NOTE — Anesthesia Postprocedure Evaluation (Signed)
Anesthesia Post Note  Patient: Wesley Young  Procedure(s) Performed: COLONOSCOPY WITH PROPOFOL (N/A ) ESOPHAGOGASTRODUODENOSCOPY (EGD) WITH PROPOFOL (N/A )  Patient location during evaluation: Endoscopy Anesthesia Type: General Level of consciousness: awake and alert Pain management: pain level controlled Vital Signs Assessment: post-procedure vital signs reviewed and stable Respiratory status: spontaneous breathing and respiratory function stable Cardiovascular status: stable Anesthetic complications: no   No complications documented.   Last Vitals:  Vitals:   03/23/20 1221 03/23/20 1231  BP: 126/89 (!) 150/89  Pulse: 81 89  Resp: 20 20  Temp:    SpO2: 100% 100%    Last Pain:  Vitals:   03/23/20 1231  TempSrc:   PainSc: 0-No pain                 KEPHART,WILLIAM K

## 2020-03-23 NOTE — Op Note (Signed)
[facility] Gastroenterology Patient Name: Wesley Young Procedure Date: 03/23/2020 11:23 AM MRN: [id number] Account #: [id number] Date of Birth: 12-Jul-1940 Admit Type: Outpatient Age: 80 Room: [facility] ENDO ROOM 2 Gender: Male Note Status: Finalized Procedure:             Colonoscopy Indications:           Unexplained iron deficiency anemia Providers:             Benay Pike. [name] MD, MD Referring MD:          Ocie Cornfield. Ouida Sills MD, MD (Referring MD) Medicines:             Propofol per Anesthesia Complications:         No immediate complications. Procedure:             Pre-Anesthesia Assessment:                        - The risks and benefits of the procedure and the                         sedation options and risks were discussed with the                         patient. All questions were answered and informed                         consent was obtained.                        - Patient identification and proposed procedure were                         verified prior to the procedure by the nurse. The                         procedure was verified in the procedure room.                        - ASA Grade Assessment: III - A patient with severe                         systemic disease.                        - After reviewing the risks and benefits, the patient                         was deemed in satisfactory condition to undergo the                         procedure.                        After obtaining informed consent, the colonoscope was                         passed under direct vision. Throughout the procedure,                         the patient's blood pressure,  pulse, and oxygen                         saturations were monitored continuously. The                         Colonoscope was introduced through the anus and                         advanced to the the cecum, identified by appendiceal                         orifice and ileocecal valve. The  colonoscopy was                         performed without difficulty. The patient tolerated                         the procedure well. The quality of the bowel                         preparation was good. The ileocecal valve, appendiceal                         orifice, and rectum were photographed. Findings:      The perianal and digital rectal examinations were normal. Pertinent       negatives include normal sphincter tone and no palpable rectal lesions.      Many small and large-mouthed diverticula were found in the entire colon.       There was no evidence of diverticular bleeding.      The exam was otherwise without abnormality. Impression:            - Mild diverticulosis in the entire examined colon.                         There was no evidence of diverticular bleeding.                        - The examination was otherwise normal.                        - No specimens collected. Recommendation:        - Await pathology results from EGD, also performed                         today.                        - Patient has a contact number available for                         emergencies. The signs and symptoms of potential                         delayed complications were discussed with the patient.                         Return to normal activities tomorrow. Written  discharge instructions were provided to the patient.                        - Resume previous diet.                        - Continue present medications.                        - No repeat colonoscopy due to current age (9 years                         or older) and the absence of colonic polyps.                        - To visualize the small bowel, perform video capsule                         endoscopy at appointment to be scheduled.                        - Return to physician assistant in 2 months.                        - The findings and recommendations were discussed with                          the patient. Procedure Code(s):     --- Professional ---                        [phone], Colonoscopy, flexible; diagnostic, including                         collection of specimen(s) by brushing or washing, when                         performed (separate procedure) Diagnosis Code(s):     --- Professional ---                        K57.30, Diverticulosis of large intestine without                         perforation or abscess without bleeding                        D50.9, Iron deficiency anemia, unspecified CPT copyright 2019 American Medical Association. All rights reserved. The codes documented in this report are preliminary and upon coder review may  be revised to meet current compliance requirements. Efrain Sella MD, MD 03/23/2020 12:09:48 PM This report has been signed electronically. Number of Addenda: 0 Note Initiated On: 03/23/2020 11:23 AM Scope Withdrawal Time: 0 hours 4 minutes 21 seconds  Total Procedure Duration: 0 hours 13 minutes 9 seconds  Estimated Blood Loss:  Estimated blood loss: none.      [facility]

## 2020-03-24 ENCOUNTER — Encounter: Payer: Self-pay | Admitting: Internal Medicine

## 2020-03-24 LAB — SURGICAL PATHOLOGY

## 2020-04-06 NOTE — H&P (Signed)
Outpatient short stay form Pre-procedure 04/06/2020 3:25 PM Teodoro K. [name], M.D.  Primary Physician: Frazier Richards, M.D.  Reason for visit:  Barrett's esophagus, Iron deficiency anemia secondary to chronic blood loss  History of present illness:  Patient presents for diagnosis of progressive anemia and found to have iron deficiency. Has no complaints of upper symptoms such as anorexia, abdominal pain, severe GERD, dysphagia, hemetemesis, melena, nausea or vomiting. Patient denies change in bowel habits, rectal bleeding, weight loss or abdominal pain.   80  y/o patient has a personal history of Barrett's esophagus without dysplasia. Patient denies hemetemesis, nausea, vomiting, dysphagia, weight loss. Takes PPI without significant side effects.    No current facility-administered medications for this encounter.  Current Outpatient Medications:  .  acetaminophen (TYLENOL) 325 MG tablet, Take 650 mg by mouth every 6 (six) hours as needed., Disp: , Rfl:  .  Apoaequorin (PREVAGEN PO), Take by mouth daily., Disp: , Rfl:  .  Ascorbic Acid (VITAMIN C PO), Take 500 mg by mouth daily., Disp: , Rfl:  .  Calcium Citrate-Vitamin D (CITRACAL + D PO), Take by mouth 2 (two) times daily., Disp: , Rfl:  .  Cyanocobalamin (VITAMIN B-12 IJ), Inject as directed every 30 (thirty) days., Disp: , Rfl:  .  enalapril (VASOTEC) 5 MG tablet, Take 5 mg daily by mouth., Disp: , Rfl:  .  ferrous sulfate 324 MG TBEC, Take 325 mg by mouth daily with breakfast., Disp: , Rfl:  .  finasteride (PROSCAR) 5 MG tablet, Take 5 mg daily by mouth., Disp: , Rfl:  .  KRILL OIL PO, Take by mouth daily., Disp: , Rfl:  .  Misc Natural Products (TART CHERRY ADVANCED PO), Take by mouth daily., Disp: , Rfl:  .  omeprazole (PRILOSEC) 20 MG capsule, Take 20 mg by mouth daily., Disp: , Rfl:  .  pramipexole (MIRAPEX) 0.5 MG tablet, Take 0.5 mg 3 (three) times daily by mouth., Disp: , Rfl:  .  Probiotic Product (ALIGN PO), Take daily by  mouth., Disp: , Rfl:  .  tamsulosin (FLOMAX) 0.4 MG CAPS capsule, Take 0.4 mg by mouth daily., Disp: , Rfl:  .  thiamine (VITAMIN B-1) 50 MG tablet, Take 50 mg by mouth daily., Disp: , Rfl:  .  aspirin EC 81 MG tablet, Take 81 mg daily by mouth. (Patient not taking: Reported on 03/23/2020), Disp: , Rfl:  .  polyethylene glycol (MIRALAX / GLYCOLAX) 17 g packet, Take 17 g by mouth daily as needed., Disp: , Rfl:  .  traMADol (ULTRAM) 50 MG tablet, Take 50 mg every 6 (six) hours as needed by mouth., Disp: , Rfl:   No medications prior to admission.     Allergies  Allergen Reactions  . Ativan [Lorazepam]   . Benadryl [Diphenhydramine]     Withdraw symptoms per patient  . Crestor [Rosuvastatin Calcium]   . Nsaids   . Peanut-Containing Drug Products Swelling  . Rapaflo [Silodosin]   . Statins Other (See Comments)  . Zetia [Ezetimibe]   . Latex Rash    Bandaids only  . Neosporin [Neomycin-Bacitracin Zn-Polymyx] Rash     Past Medical History:  Diagnosis Date  . Abnormal PSA    s/p post prostate biopsy in the past which was negative  . Alcoholism ([place])    with prolonged hospitalization 2009 for withdrawal c/b aspiration pneumonia requiring vent  . Anxiety   . Atrial fibrillation and flutter ([place])   . B12 deficiency   . Cardiomyopathy ([place])  mild probably multifactorial secondary to HTn and possible alcohol contribution. Left ventricular ejection fraction app 45 %  . CHF (congestive heart failure) (H[facility])    mild left ventricular systolic dysfunction  . Dental bridge present    "[place]" bridge, top - right  . Depression   . Duodenitis   . Dyspnea    on exertion  . Dyspnea on exertion   . Dysrhythmia   . Erosive esophagitis   . Esophageal varices ([place])   . 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 Gross hematuria   . High cholesterol   . Hx of basal cell carcinoma 2009   multiple sites  . Hx of dysplastic nevus 2005   multiple sites  . 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 Hx of squamous cell carcinoma of skin 2014   multiple sites  . Hypertension   . Impotence   . 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 Sleep apnea   . Spinal stenosis   . Varicose veins of both lower extremities     Review of systems:  Otherwise negative.    Physical Exam  Gen: Alert, oriented. Appears stated age.  HEENT: [place]/AT. PERRLA. Lungs: CTA, no wheezes. CV: RR nl S1, S2. Abd: soft, benign, no masses. BS+ Ext: No edema. Pulses 2+    Planned procedures: Proceed with EGD and colonoscopy. The patient understands the nature of the planned procedure, indications, risks, alternatives and potential complications including but not limited to bleeding, infection, perforation, damage to internal organs and possible oversedation/side effects from anesthesia. The patient agrees and gives consent to proceed.  Please refer to procedure notes for findings, recommendations and patient disposition/instructions.     Teodoro K. [name], M.D. Gastroenterology 04/06/2020  3:25 PM

## 2020-04-06 NOTE — Interval H&P Note (Signed)
History and Physical Interval Note:  04/06/2020 3:27 PM  Wesley Young  has presented today for surgery, with the diagnosis of IDA.  The various methods of treatment have been discussed with the patient and family. After consideration of risks, benefits and other options for treatment, the patient has consented to  Procedure(s): COLONOSCOPY WITH PROPOFOL (N/A) ESOPHAGOGASTRODUODENOSCOPY (EGD) WITH PROPOFOL (N/A) as a surgical intervention.  The patient's history has been reviewed, patient examined, no change in status, stable for surgery.  I have reviewed the patient's chart and labs.  Questions were answered to the patient's satisfaction.     [place], [place]

## 2020-05-14 ENCOUNTER — Ambulatory Visit: Payer: Self-pay | Admitting: Urology

## 2020-05-18 ENCOUNTER — Ambulatory Visit: Payer: Medicare Other | Admitting: Urology

## 2020-08-12 ENCOUNTER — Other Ambulatory Visit: Payer: Self-pay

## 2020-08-12 ENCOUNTER — Encounter: Payer: Medicare Other | Admitting: Dermatology

## 2020-08-20 ENCOUNTER — Ambulatory Visit (INDEPENDENT_AMBULATORY_CARE_PROVIDER_SITE_OTHER): Payer: Medicare Other | Admitting: Dermatology

## 2020-08-20 ENCOUNTER — Other Ambulatory Visit: Payer: Self-pay

## 2020-08-20 DIAGNOSIS — Z85828 Personal history of other malignant neoplasm of skin: Secondary | ICD-10-CM

## 2020-08-20 DIAGNOSIS — D1800 Hemangioma unspecified site: Secondary | ICD-10-CM

## 2020-08-20 DIAGNOSIS — L820 Inflamed seborrheic keratosis: Secondary | ICD-10-CM

## 2020-08-20 DIAGNOSIS — Z86018 Personal history of other benign neoplasm: Secondary | ICD-10-CM | POA: Diagnosis not present

## 2020-08-20 DIAGNOSIS — L578 Other skin changes due to chronic exposure to nonionizing radiation: Secondary | ICD-10-CM

## 2020-08-20 DIAGNOSIS — D229 Melanocytic nevi, unspecified: Secondary | ICD-10-CM

## 2020-08-20 DIAGNOSIS — Z86006 Personal history of melanoma in-situ: Secondary | ICD-10-CM | POA: Diagnosis not present

## 2020-08-20 DIAGNOSIS — L570 Actinic keratosis: Secondary | ICD-10-CM | POA: Diagnosis not present

## 2020-08-20 DIAGNOSIS — Z1283 Encounter for screening for malignant neoplasm of skin: Secondary | ICD-10-CM | POA: Diagnosis not present

## 2020-08-20 DIAGNOSIS — L821 Other seborrheic keratosis: Secondary | ICD-10-CM

## 2020-08-20 DIAGNOSIS — L814 Other melanin hyperpigmentation: Secondary | ICD-10-CM

## 2020-08-20 NOTE — Patient Instructions (Signed)

## 2020-08-20 NOTE — Progress Notes (Signed)
Follow-Up Visit   Subjective  Wesley Young is a 80 y.o. male who presents for the following: Annual Exam (History of Melanoma in situ, SCC, BCC, Dysplastic nevi - TBSE today). The patient presents for Total-Body Skin Exam (TBSE) for skin cancer screening and mole check.  The following portions of the chart were reviewed this encounter and updated as appropriate:   Tobacco  Allergies  Meds  Problems  Med Hx  Surg Hx  Fam Hx      Review of Systems:  No other skin or systemic complaints except as noted in HPI or Assessment and Plan.  Objective  Well appearing patient in no apparent distress; mood and affect are within normal limits.  A full examination was performed including scalp, head, eyes, ears, nose, lips, neck, chest, axillae, abdomen, back, buttocks, bilateral upper extremities, bilateral lower extremities, hands, feet, fingers, toes, fingernails, and toenails. All findings within normal limits unless otherwise noted below.  Left distal dorsum forearm x 1, face x 14 (15) Erythematous thin papules/macules with gritty scale.   Left suprasternal notch x 1, right chest x 1, right forearm x 2 - total 4 (4) Erythematous keratotic or waxy stuck-on papule or plaque.    Assessment & Plan   History of Dysplastic Nevi - No evidence of recurrence today - Recommend regular full body skin exams - Recommend daily broad spectrum sunscreen SPF 30+ to sun-exposed areas, reapply every 2 hours as needed.  - Call if any new or changing lesions are noted between office visits   History of Basal Cell Carcinoma of the Skin - No evidence of recurrence today - Recommend regular full body skin exams - Recommend daily broad spectrum sunscreen SPF 30+ to sun-exposed areas, reapply every 2 hours as needed.  - Call if any new or changing lesions are noted between office visits  History of Squamous Cell Carcinoma of the Skin and Sarcoid - No evidence of recurrence today - No  lymphadenopathy - Recommend regular full body skin exams - Recommend daily broad spectrum sunscreen SPF 30+ to sun-exposed areas, reapply every 2 hours as needed.  - Call if any new or changing lesions are noted between office visits  History of Melanoma in Situ - No evidence of recurrence today - Recommend regular full body skin exams - Recommend daily broad spectrum sunscreen SPF 30+ to sun-exposed areas, reapply every 2 hours as needed.  - Call if any new or changing lesions are noted between office visits  Lentigines - Scattered tan macules - Due to sun exposure - Benign-appering, observe - Recommend daily broad spectrum sunscreen SPF 30+ to sun-exposed areas, reapply every 2 hours as needed. - Call for any changes  Seborrheic Keratoses - Stuck-on, waxy, tan-brown papules and/or plaques  - Benign-appearing - Discussed benign etiology and prognosis. - Observe - Call for any changes  Melanocytic Nevi - Tan-brown and/or pink-flesh-colored symmetric macules and papules - Benign appearing on exam today - Observation - Call clinic for new or changing moles - Recommend daily use of broad spectrum spf 30+ sunscreen to sun-exposed areas.   Hemangiomas - Red papules - Discussed benign nature - Observe - Call for any changes  Actinic Damage - Chronic condition, secondary to cumulative UV/sun exposure - diffuse scaly erythematous macules with underlying dyspigmentation - Recommend daily broad spectrum sunscreen SPF 30+ to sun-exposed areas, reapply every 2 hours as needed.  - Staying in the shade or wearing long sleeves, sun glasses (UVA+UVB protection) and wide brim hats (  4-inch brim around the entire circumference of the hat) are also recommended for sun protection.  - Call for new or changing lesions.  Skin cancer screening performed today.  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) Left distal dorsum forearm x 1, face x 14  Destruction of lesion - Left distal dorsum forearm x 1, face  x 14 Complexity: simple   Destruction method: cryotherapy   Informed consent: discussed and consent obtained   Timeout:  patient name, date of birth, surgical site, and procedure verified Lesion destroyed using liquid nitrogen: Yes   Region frozen until ice ball extended beyond lesion: Yes   Outcome: patient tolerated procedure well with no complications   Post-procedure details: wound care instructions given    Inflamed seborrheic keratosis Left suprasternal notch x 1, right chest x 1, right forearm x 2 - total 4  Destruction of lesion - Left suprasternal notch x 1, right chest x 1, right forearm x 2 - total 4 Complexity: simple   Destruction method: cryotherapy   Informed consent: discussed and consent obtained   Timeout:  patient name, date of birth, surgical site, and procedure verified Lesion destroyed using liquid nitrogen: Yes   Region frozen until ice ball extended beyond lesion: Yes   Outcome: patient tolerated procedure well with no complications   Post-procedure details: wound care instructions given    Return in about 1 year (around 08/20/2021) for TBSE.  I, Ashok Cordia, CMA, am acting as scribe for Sarina Ser, MD .  Documentation: I have reviewed the above documentation for accuracy and completeness, and I agree with the above.  Sarina Ser, MD

## 2020-08-29 ENCOUNTER — Encounter: Payer: Self-pay | Admitting: Dermatology

## 2020-11-18 ENCOUNTER — Encounter: Payer: Self-pay | Admitting: Urology

## 2020-11-18 ENCOUNTER — Ambulatory Visit (INDEPENDENT_AMBULATORY_CARE_PROVIDER_SITE_OTHER): Payer: Medicare Other | Admitting: Urology

## 2020-11-18 ENCOUNTER — Other Ambulatory Visit: Payer: Self-pay

## 2020-11-18 VITALS — BP 161/72 | HR 60 | Ht 72.0 in | Wt 175.0 lb

## 2020-11-18 DIAGNOSIS — N138 Other obstructive and reflux uropathy: Secondary | ICD-10-CM | POA: Diagnosis not present

## 2020-11-18 DIAGNOSIS — N401 Enlarged prostate with lower urinary tract symptoms: Secondary | ICD-10-CM | POA: Diagnosis not present

## 2020-11-18 DIAGNOSIS — R3121 Asymptomatic microscopic hematuria: Secondary | ICD-10-CM

## 2020-11-18 LAB — BLADDER SCAN AMB NON-IMAGING

## 2020-11-18 NOTE — Progress Notes (Signed)
   11/18/2020 10:10 AM   Wesley Young 1941-01-25 [id number]  Reason for visit: Follow up BPH, history of UTI and retention, microscopic hematuria  HPI: 80 year old male with long history of BPH on maximal medical therapy with Flomax and finasteride.  He has a distant history of urinary retention, as well as a history of a UTI within the last year.  At our last visit, he had low-grade microscopic hematuria with 3-10 RBCs and I recommended further evaluation with CT and cystoscopy per the AUA guidelines, but he deferred.  He understands the risk of missing a clinically significant malignancy or other pathology.  Today, PVR is mildly elevated at 154 mL, and he really denies any problems with urination.  IPSS score is 3, with quality of life pleased.  He recently had a spine procedure at [facility] for bowel incontinence, which has not resolved that problem.  He is not having any urinary incontinence or change in urination after that procedure.  Return precautions discussed extensively including gross hematuria, urinary retention, UTI symptoms, or worsening urinary symptoms.  He would like to avoid any procedures or catheterization if at all possible.  Continue Flomax and finasteride RTC 1 year IPSS/PVR  Billey Co, MD  [place] [address], [place], [place] [address] [phone]

## 2020-11-18 NOTE — Patient Instructions (Signed)
Benign Prostatic Hyperplasia Benign prostatic hyperplasia (BPH) is an enlarged prostate gland that is caused by the normal aging process and not by cancer. The prostate is a walnut-sized gland that is involved in the production of semen. It is located in front of the rectum and below the bladder. The bladder stores urine and the urethra is the tube that carries the urine out of the body. The prostate may get bigger as a man gets older. An enlarged prostate can press on the urethra. This can make it harder to pass urine. The build-up of urine in the bladder can cause infection. Back pressure and infection may progress to bladder damage and kidney (renal) failure. What are the causes? This condition is part of a normal aging process. However, not all men develop problems from this condition. If the prostate enlarges away from the urethra, urine flow will not be blocked. If it enlarges toward the urethra and compresses it, there will be problems passing urine. What increases the risk? This condition is more likely to develop in men over the age of 50 years. What are the signs or symptoms? Symptoms of this condition include: Getting up often during the night to urinate. Needing to urinate frequently during the day. Difficulty starting urine flow. Decrease in size and strength of your urine stream. Leaking (dribbling) after urinating. Inability to pass urine. This needs immediate treatment. Inability to completely empty your bladder. Pain when you pass urine. This is more common if there is also an infection. Urinary tract infection (UTI). How is this diagnosed? This condition is diagnosed based on your medical history, a physical exam, and your symptoms. Tests will also be done, such as: A post-void bladder scan. This measures any amount of urine that may remain in your bladder after you finish urinating. A digital rectal exam. In a rectal exam, your health care provider checks your prostate by  putting a lubricated, gloved finger into your rectum to feel the back of your prostate gland. This exam detects the size of your gland and any abnormal lumps or growths. An exam of your urine (urinalysis). A prostate specific antigen (PSA) screening. This is a blood test used to screen for prostate cancer. An ultrasound. This test uses sound waves to electronically produce a picture of your prostate gland. Your health care provider may refer you to a specialist in kidney and prostate diseases (urologist). How is this treated? Once symptoms begin, your health care provider will monitor your condition (active surveillance or watchful waiting). Treatment for this condition will depend on the severity of your condition. Treatment may include: Observation and yearly exams. This may be the only treatment needed if your condition and symptoms are mild. Medicines to relieve your symptoms, including: Medicines to shrink the prostate. Medicines to relax the muscle of the prostate. Surgery in severe cases. Surgery may include: Prostatectomy. In this procedure, the prostate tissue is removed completely through an open incision or with a laparoscope or robotics. Transurethral resection of the prostate (TURP). In this procedure, a tool is inserted through the opening at the tip of the penis (urethra). It is used to cut away tissue of the inner core of the prostate. The pieces are removed through the same opening of the penis. This removes the blockage. Transurethral incision (TUIP). In this procedure, small cuts are made in the prostate. This lessens the prostate's pressure on the urethra. Transurethral microwave thermotherapy (TUMT). This procedure uses microwaves to create heat. The heat destroys and removes a   small amount of prostate tissue. Transurethral needle ablation (TUNA). This procedure uses radio frequencies to destroy and remove a small amount of prostate tissue. Interstitial laser coagulation (ILC).  This procedure uses a laser to destroy and remove a small amount of prostate tissue. Transurethral electrovaporization (TUVP). This procedure uses electrodes to destroy and remove a small amount of prostate tissue. Prostatic urethral lift. This procedure inserts an implant to push the lobes of the prostate away from the urethra. Follow these instructions at home: Take over-the-counter and prescription medicines only as told by your health care provider. Monitor your symptoms for any changes. Contact your health care provider with any changes. Avoid drinking large amounts of liquid before going to bed or out in public. Avoid or reduce how much caffeine or alcohol you drink. Give yourself time when you urinate. Keep all follow-up visits as told by your health care provider. This is important. Contact a health care provider if: You have unexplained back pain. Your symptoms do not get better with treatment. You develop side effects from the medicine you are taking. Your urine becomes very dark or has a bad smell. Your lower abdomen becomes distended and you have trouble passing your urine. Get help right away if: You have a fever or chills. You suddenly cannot urinate. You feel lightheaded, or very dizzy, or you faint. There are large amounts of blood or clots in the urine. Your urinary problems become hard to manage. You develop moderate to severe low back or flank pain. The flank is the side of your body between the ribs and the hip. These symptoms may represent a serious problem that is an emergency. Do not wait to see if the symptoms will go away. Get medical help right away. Call your local emergency services (911 in the [place] Do not drive yourself to the hospital. Summary Benign prostatic hyperplasia (BPH) is an enlarged prostate that is caused by the normal aging process and not by cancer. An enlarged prostate can press on the urethra. This can make it hard to pass urine. This  condition is part of a normal aging process and is more likely to develop in men over the age of 50 years. Get help right away if you suddenly cannot urinate. This information is not intended to replace advice given to you by your health care provider. Make sure you discuss any questions you have with your health care provider. Document Revised: 05/20/2020 Document Reviewed: 10/17/2019 Elsevier Patient Education  2022 [employer] Inc[employer]  

## 2021-02-18 ENCOUNTER — Encounter: Payer: Self-pay | Admitting: Urology

## 2021-04-06 ENCOUNTER — Other Ambulatory Visit: Payer: Self-pay

## 2021-04-06 ENCOUNTER — Ambulatory Visit (INDEPENDENT_AMBULATORY_CARE_PROVIDER_SITE_OTHER): Payer: Medicare Other | Admitting: Dermatology

## 2021-04-06 DIAGNOSIS — L820 Inflamed seborrheic keratosis: Secondary | ICD-10-CM | POA: Diagnosis not present

## 2021-04-06 DIAGNOSIS — D692 Other nonthrombocytopenic purpura: Secondary | ICD-10-CM

## 2021-04-06 DIAGNOSIS — L578 Other skin changes due to chronic exposure to nonionizing radiation: Secondary | ICD-10-CM | POA: Diagnosis not present

## 2021-04-06 DIAGNOSIS — Z85820 Personal history of malignant melanoma of skin: Secondary | ICD-10-CM

## 2021-04-06 DIAGNOSIS — S70361A Insect bite (nonvenomous), right thigh, initial encounter: Secondary | ICD-10-CM

## 2021-04-06 DIAGNOSIS — W57XXXA Bitten or stung by nonvenomous insect and other nonvenomous arthropods, initial encounter: Secondary | ICD-10-CM

## 2021-04-06 DIAGNOSIS — L570 Actinic keratosis: Secondary | ICD-10-CM

## 2021-04-06 NOTE — Progress Notes (Signed)
° °  Follow-Up Visit   Subjective  JADYN BARGE is a 81 y.o. male who presents for the following: Irregular skin lesion (R ant thigh - appeared 2-3 days ago, has split into two separate lesions. Patient has a hx of MM and is concerned this lesion may be MM. ). .The patient has spots, moles and lesions to be evaluated, some may be new or changing and the patient has concerns that these could be cancer.  The following portions of the chart were reviewed this encounter and updated as appropriate:   Tobacco   Allergies   Meds   Problems   Med Hx   Surg Hx   Fam Hx      Review of Systems:  No other skin or systemic complaints except as noted in HPI or Assessment and Plan.  Objective  Well appearing patient in no apparent distress; mood and affect are within normal limits.  A focused examination was performed including the legs. Relevant physical exam findings are noted in the Assessment and Plan.  R ant thigh Pink excoriated pap  forehead x 1 Pink scaly macules  forehead x 1 Stuck on waxy paps with erythema   Assessment & Plan   Actinic Damage - chronic, secondary to cumulative UV radiation exposure/sun exposure over time - diffuse scaly erythematous macules with underlying dyspigmentation - Recommend daily broad spectrum sunscreen SPF 30+ to sun-exposed areas, reapply every 2 hours as needed.  - Recommend staying in the shade or wearing long sleeves, sun glasses (UVA+UVB protection) and wide brim hats (4-inch brim around the entire circumference of the hat). - Call for new or changing lesions.   Bug bite (vs trauma) without infection - with purpura, initial encounter R ant thigh Wound cleansed with puracyn, Mupirocin applied followed by a bandage. Benign-appearing.  Observation.  Call clinic for new or changing lesions.  Recommend daily use of broad spectrum spf 30+ sunscreen to sun-exposed areas.   AK (actinic keratosis) forehead x 1  Destruction of lesion - forehead x  1 Complexity: simple   Destruction method: cryotherapy   Informed consent: discussed and consent obtained   Timeout:  patient name, date of birth, surgical site, and procedure verified Lesion destroyed using liquid nitrogen: Yes   Region frozen until ice ball extended beyond lesion: Yes   Outcome: patient tolerated procedure well with no complications   Post-procedure details: wound care instructions given    Inflamed seborrheic keratosis forehead x 1  Destruction of lesion - forehead x 1 Complexity: simple   Destruction method: cryotherapy   Informed consent: discussed and consent obtained   Timeout:  patient name, date of birth, surgical site, and procedure verified Lesion destroyed using liquid nitrogen: Yes   Region frozen until ice ball extended beyond lesion: Yes   Outcome: patient tolerated procedure well with no complications   Post-procedure details: wound care instructions given     Return for appointment as scheduled.  Luther Redo, CMA, am acting as scribe for Sarina Ser, MD .  I, Othelia Pulling, RMA, am acting as scribe for Sarina Ser, MD . Documentation: I have reviewed the above documentation for accuracy and completeness, and I agree with the above.  Sarina Ser, MD

## 2021-04-06 NOTE — Patient Instructions (Addendum)
If You Need Anything After Your Visit  If you have any questions or concerns for your doctor, please call our main line at [phone] and press option 4 to reach your doctor's medical assistant. If no one answers, please leave a voicemail as directed and we will return your call as soon as possible. Messages left after 4 pm will be answered the following business day.   You may also send us a message via MyChart. We typically respond to MyChart messages within 1-2 business days.  For prescription refills, please ask your pharmacy to contact our office. Our fax number is [phone].  If you have an urgent issue when the clinic is closed that cannot wait until the next business day, you can page your doctor at the number below.    Please note that while we do our best to be available for urgent issues outside of office hours, we are not available 24/7.   If you have an urgent issue and are unable to reach us, you may choose to seek medical care at your doctor's office, retail clinic, urgent care center, or emergency room.  If you have a medical emergency, please immediately call 911 or go to the emergency department.  Pager Numbers  - Dr. Kowalski: [phone]  - Dr. Moye: [phone]  - Dr. Stewart: [phone]  In the event of inclement weather, please call our main line at [phone] for an update on the status of any delays or closures.  Dermatology Medication Tips: Please keep the boxes that topical medications come in in order to help keep track of the instructions about where and how to use these. Pharmacies typically print the medication instructions only on the boxes and not directly on the medication tubes.   If your medication is too expensive, please contact our office at [phone] option 4 or send us a message through MyChart.   We are unable to tell what your co-pay for medications will be in advance as this is different depending on your insurance coverage.  However, we may be able to find a substitute medication at lower cost or fill out paperwork to get insurance to cover a needed medication.   If a prior authorization is required to get your medication covered by your insurance company, please allow us 1-2 business days to complete this process.  Drug prices often vary depending on where the prescription is filled and some pharmacies may offer cheaper prices.  The website www.goodrx.com contains coupons for medications through different pharmacies. The prices here do not account for what the cost may be with help from insurance (it may be cheaper with your insurance), but the website can give you the price if you did not use any insurance.  - You can print the associated coupon and take it with your prescription to the pharmacy.  - You may also stop by our office during regular business hours and pick up a GoodRx coupon card.  - If you need your prescription sent electronically to a different pharmacy, notify our office through [facility] MyChart or by phone at [phone] option 4.     Si Usted Necesita Algo Despus de Su Visita  Tambin puede enviarnos un mensaje a travs de MyChart. Por lo general respondemos a los mensajes de MyChart en el transcurso de 1 a 2 das hbiles.  Para renovar recetas, por favor pida a su farmacia que se ponga en contacto con nuestra oficina. Nuestro nmero de fax es el [phone].  Si tiene   un asunto urgente cuando la clnica est cerrada y que no puede esperar hasta el siguiente da hbil, puede llamar/localizar a su doctor(a) al nmero que aparece a continuacin.   Por favor, tenga en cuenta que aunque hacemos todo lo posible para estar disponibles para asuntos urgentes fuera del horario de oficina, no estamos disponibles las 24 horas del da, los 7 das de la semana.   Si tiene un problema urgente y no puede comunicarse con nosotros, puede optar por buscar atencin mdica  en el consultorio de su  doctor(a), en una clnica privada, en un centro de atencin urgente o en una sala de emergencias.  Si tiene una emergencia mdica, por favor llame inmediatamente al 911 o vaya a la sala de emergencias.  Nmeros de bper  - Dr. Kowalski: [phone]  - Dra. Moye: [phone]  - Dra. Stewart: [phone]  En caso de inclemencias del tiempo, por favor llame a nuestra lnea principal al [phone] para una actualizacin sobre el estado de cualquier retraso o cierre.  Consejos para la medicacin en dermatologa: Por favor, guarde las cajas en las que vienen los medicamentos de uso tpico para ayudarle a seguir las instrucciones sobre dnde y cmo usarlos. Las farmacias generalmente imprimen las instrucciones del medicamento slo en las cajas y no directamente en los tubos del medicamento.   Si su medicamento es muy caro, por favor, pngase en contacto con nuestra oficina llamando al [phone] y presione la opcin 4 o envenos un mensaje a travs de MyChart.   No podemos decirle cul ser su copago por los medicamentos por adelantado ya que esto es diferente dependiendo de la cobertura de su seguro. Sin embargo, es posible que podamos encontrar un medicamento sustituto a menor costo o llenar un formulario para que el seguro cubra el medicamento que se considera necesario.   Si se requiere una autorizacin previa para que su compaa de seguros cubra su medicamento, por favor permtanos de 1 a 2 das hbiles para completar este proceso.  Los precios de los medicamentos varan con frecuencia dependiendo del lugar de dnde se surte la receta y alguna farmacias pueden ofrecer precios ms baratos.  El sitio web www.goodrx.com tiene cupones para medicamentos de diferentes farmacias. Los precios aqu no tienen en cuenta lo que podra costar con la ayuda del seguro (puede ser ms barato con su seguro), pero el sitio web puede darle el precio si no utiliz ningn seguro.  - Puede imprimir el cupn  correspondiente y llevarlo con su receta a la farmacia.  - Tambin puede pasar por nuestra oficina durante el horario de atencin regular y recoger una tarjeta de cupones de GoodRx.  - Si necesita que su receta se enve electrnicamente a una farmacia diferente, informe a nuestra oficina a travs de MyChart de [place] o por telfono llamando al [phone] y presione la opcin 4.   Cryotherapy Aftercare  Wash gently with soap and water everyday.   Apply Vaseline and Band-Aid daily until healed.  

## 2021-04-07 ENCOUNTER — Encounter: Payer: Self-pay | Admitting: Dermatology

## 2021-07-02 ENCOUNTER — Other Ambulatory Visit: Payer: Self-pay

## 2021-07-02 ENCOUNTER — Emergency Department
Admission: EM | Admit: 2021-07-02 | Discharge: 2021-07-02 | Disposition: A | Discharge status: Home / Self Care | Payer: Medicare Other | Attending: Emergency Medicine | Admitting: Emergency Medicine

## 2021-07-02 ENCOUNTER — Emergency Department: Payer: Medicare Other

## 2021-07-02 DIAGNOSIS — I480 Paroxysmal atrial fibrillation: Secondary | ICD-10-CM | POA: Insufficient documentation

## 2021-07-02 DIAGNOSIS — G600 Hereditary motor and sensory neuropathy: Secondary | ICD-10-CM | POA: Insufficient documentation

## 2021-07-02 DIAGNOSIS — R1013 Epigastric pain: Secondary | ICD-10-CM | POA: Diagnosis present

## 2021-07-02 DIAGNOSIS — K8500 Idiopathic acute pancreatitis without necrosis or infection: Secondary | ICD-10-CM | POA: Diagnosis not present

## 2021-07-02 DIAGNOSIS — I110 Hypertensive heart disease with heart failure: Secondary | ICD-10-CM | POA: Diagnosis not present

## 2021-07-02 DIAGNOSIS — F1021 Alcohol dependence, in remission: Secondary | ICD-10-CM | POA: Diagnosis not present

## 2021-07-02 DIAGNOSIS — R9431 Abnormal electrocardiogram [ECG] [EKG]: Secondary | ICD-10-CM | POA: Diagnosis not present

## 2021-07-02 DIAGNOSIS — R110 Nausea: Secondary | ICD-10-CM

## 2021-07-02 DIAGNOSIS — I5032 Chronic diastolic (congestive) heart failure: Secondary | ICD-10-CM | POA: Diagnosis not present

## 2021-07-02 LAB — COMPREHENSIVE METABOLIC PANEL
ALT: 15 U/L (ref 0–44)
AST: 15 U/L (ref 15–41)
Albumin: 4.1 g/dL (ref 3.5–5.0)
Alkaline Phosphatase: 101 U/L (ref 38–126)
Anion gap: 5 (ref 5–15)
BUN: 25 mg/dL — ABNORMAL HIGH (ref 8–23)
CO2: 26 mmol/L (ref 22–32)
Calcium: 9.8 mg/dL (ref 8.9–10.3)
Chloride: 105 mmol/L (ref 98–111)
Creatinine, Ser: 0.88 mg/dL (ref 0.61–1.24)
GFR, Estimated: >60 mL/min (ref >60)
Glucose, Bld: 117 mg/dL — ABNORMAL HIGH (ref 70–99)
Potassium: 4 mmol/L (ref 3.5–5.1)
Sodium: 136 mmol/L (ref 135–145)
Total Bilirubin: 0.3 mg/dL (ref 0.3–1.2)
Total Protein: 6.9 g/dL (ref 6.5–8.1)

## 2021-07-02 LAB — CBC WITH DIFFERENTIAL/PLATELET
Abs Immature Granulocytes: 0.02 10*3/uL (ref 0.00–0.07)
Basophils Absolute: 0.1 10*3/uL (ref 0.0–0.1)
Basophils Relative: 1 %
Eosinophils Absolute: 0.2 10*3/uL (ref 0.0–0.5)
Eosinophils Relative: 4 %
HCT: 41.3 % (ref 39.0–52.0)
Hemoglobin: 13.6 g/dL (ref 13.0–17.0)
Immature Granulocytes: 0 %
Lymphocytes Relative: 15 %
Lymphs Abs: 0.9 10*3/uL (ref 0.7–4.0)
MCH: 30.5 pg (ref 26.0–34.0)
MCHC: 32.9 g/dL (ref 30.0–36.0)
MCV: 92.6 fL (ref 80.0–100.0)
Monocytes Absolute: 0.5 10*3/uL (ref 0.1–1.0)
Monocytes Relative: 9 %
Neutro Abs: 4.2 10*3/uL (ref 1.7–7.7)
Neutrophils Relative %: 71 %
Platelets: 229 10*3/uL (ref 150–400)
RBC: 4.46 MIL/uL (ref 4.22–5.81)
RDW: 12.2 % (ref 11.5–15.5)
WBC: 5.9 10*3/uL (ref 4.0–10.5)
nRBC: 0 % (ref 0.0–0.2)

## 2021-07-02 LAB — URINALYSIS, ROUTINE W REFLEX MICROSCOPIC
Bilirubin Urine: NEGATIVE
Glucose, UA: NEGATIVE mg/dL
Hgb urine dipstick: NEGATIVE
Ketones, ur: NEGATIVE mg/dL
Leukocytes,Ua: NEGATIVE
Nitrite: NEGATIVE
Protein, ur: NEGATIVE mg/dL
Specific Gravity, Urine: 1.008 (ref 1.005–1.030)
pH: 6 (ref 5.0–8.0)

## 2021-07-02 LAB — TROPONIN I (HIGH SENSITIVITY)
Troponin I (High Sensitivity): 9 ng/L (ref <18)
Troponin I (High Sensitivity): 9 ng/L (ref <18)

## 2021-07-02 LAB — LIPASE, BLOOD: Lipase: 63 U/L — ABNORMAL HIGH (ref 11–51)

## 2021-07-02 LAB — ETHANOL: Alcohol, Ethyl (B): <10 mg/dL (ref <10)

## 2021-07-02 MED ORDER — ASPIRIN 81 MG PO CHEW
324.0000 mg | CHEWABLE_TABLET | Freq: Once | ORAL | Status: AC
  Administered 2021-07-02: 324 mg via ORAL
  Filled 2021-07-02: qty 4

## 2021-07-02 MED ORDER — ONDANSETRON 4 MG PO TBDP: 4.0000 mg | ORAL_TABLET | Freq: Three times a day (TID) | ORAL | 0 refills | Status: DC | PRN

## 2021-07-02 MED ORDER — ONDANSETRON HCL 4 MG/2ML IJ SOLN
4.0000 mg | Freq: Once | INTRAMUSCULAR | Status: AC
  Administered 2021-07-02: 4 mg via INTRAVENOUS
  Filled 2021-07-02: qty 2

## 2021-07-02 NOTE — ED Provider Notes (Signed)
? ?[facility] ?Provider Note ? ? ? Event Date/Time  ? First MD Initiated Contact with Patient 07/02/21 0545   ?  (approximate) ? ? ?History  ? ?Nausea ? ? ?HPI ? ?Wesley Young is a 81 y.o. male who presents to the ED for evaluation of Nausea ?  ?I reviewed PCP visit from November.  Previous history of alcoholism and sensorimotor neuropathy. ?I reviewed cardiology visit from December.  Paroxysmal atrial fibrillation, HTN, diastolic CHF.  No anticoagulation. ? ?Patient presents to the ED for evaluation of resolving nausea and a brief episode of epigastric discomfort.  He reports eating a bag of mixed nuts with his wife last night around 12 hours ago when he developed nausea without emesis.  Reports he has some epigastric burning pressure or discomfort "for a little while" earlier but this has since resolved.  Reports his nausea is better now and reports feeling better overall but wanted to get checked out. ? ?No emesis, diarrhea, dysuria, shortness of breath, cough, syncope, fever, chest pain. ? ?Physical Exam  ? ?Triage Vital Signs: ?ED Triage Vitals  ?Enc Vitals Group  ?   BP 07/02/21 0549 (!) 157/83  ?   Pulse Rate 07/02/21 0549 (!) 54  ?   Resp --   ?   Temp 07/02/21 0549 97.6 ?F (36.4 ?C)  ?   Temp Source 07/02/21 0549 Oral  ?   SpO2 07/02/21 0549 100 %  ?   Weight 07/02/21 0543 178 lb (80.7 kg)  ?   Height 07/02/21 0543 6' (1.829 m)  ?   Head Circumference --   ?   Peak Flow --   ?   Pain Score 07/02/21 0543 0  ?   Pain Loc --   ?   Pain Edu? --   ?   Excl. in [place]? --   ? ? ?Most recent vital signs: ?Vitals:  ? 07/02/21 0549  ?BP: (!) 157/83  ?Pulse: (!) 54  ?Temp: 97.6 ?F (36.4 ?C)  ?SpO2: 100%  ? ? ?General: Awake, no distress.  Pleasant and conversational ?CV:  Good peripheral perfusion.  ?Resp:  Normal effort.  ?Abd:  No distention.  Soft and benign throughout ?MSK:  No deformity noted.  ?Neuro:  No focal deficits appreciated. ?Other:   ? ? ?ED Results / Procedures / Treatments   ? ?Labs ?(all labs ordered are listed, but only abnormal results are displayed) ?Labs Reviewed  ?LIPASE, BLOOD - Abnormal; Notable for the following components:  ?    Result Value  ? Lipase 63 (*)   ? All other components within normal limits  ?COMPREHENSIVE METABOLIC PANEL - Abnormal; Notable for the following components:  ? Glucose, Bld 117 (*)   ? BUN 25 (*)   ? All other components within normal limits  ?CBC WITH DIFFERENTIAL/PLATELET  ?ETHANOL  ?URINALYSIS, ROUTINE W REFLEX MICROSCOPIC  ?TROPONIN I (HIGH SENSITIVITY)  ? ? ?EKG ?Mixture of slow atrial fibrillation and a couple beats of a sinus rhythm, ventricular rate around 51 bpm.  Normal axis.  Reassuring intervals.  Fairly diffuse J-point depressions with slight elevations to aVR concerning for subendocardial ischemia.  No STEMI.  No recent comparison.  Last EKG was from 2009, and these features were not present on that EKG. ? ?RADIOLOGY ?CXR reviewed by me without evidence of acute cardiopulmonary pathology. ? ?Official radiology report(s): ?No results found. ? ?PROCEDURES and INTERVENTIONS: ? ?.1-3 Lead EKG Interpretation ?Performed by: Vladimir Crofts, MD ?Authorized by: Tamala Julian,  Camillia Herter, MD  ? ?  Interpretation: normal   ?  ECG rate:  58 ?  ECG rate assessment: bradycardic   ?  Rhythm: sinus bradycardia   ?  Ectopy: none   ?  Conduction: normal   ? ?Medications  ?ondansetron (ZOFRAN) injection 4 mg (4 mg Intravenous Given 07/02/21 0612)  ?aspirin chewable tablet 324 mg (324 mg Oral Given 07/02/21 0612)  ? ? ? ?IMPRESSION / MDM / ASSESSMENT AND PLAN / ED COURSE  ?I reviewed the triage vital signs and the nursing notes. ? ?81 year old male presents to the ED with improving nausea, possibly due to acute pancreatitis.  Look systemically well and has a reassuring examination.  Benign abdomen without evidence of tenderness, guarding or peritoneal features.  EKG with some diffuse subtle J-point depressions without any recent comparison.  Concerning for mild global  ischemia or subendocardial ischemia, but may be chronic.  His first troponin is negative.  CBC and CMP are reassuring and without evidence of acute pathology.  His lipase is slightly elevated and acute idiopathic pancreatitis certainly may be the source of his symptoms.  Asymptomatic after Zofran.  Empirically provided aspirin due to his EKG findings.  We will keep him on the monitor and obtain a second troponin and reevaluate.  He is signed out to oncoming provider.  If the second troponin is negative, and he continues to feel well, outpatient management would be reasonable. ? ?Clinical Course as of 07/02/21 0649  ?Fri Jul 02, 2021  ?0648 Reassessed.  Feels well.  We discussed slight elevation of his lipase and possible etiologies of his symptoms.  I recommended a second troponin and he is agreeable to stay for this.  If this is normal we discussed disposition and he is comfortable with going home with a prescription of Zofran. [DS]  ?  ?Clinical Course User Index ?[DS] Vladimir Crofts, MD  ? ? ? ?FINAL CLINICAL IMPRESSION(S) / ED DIAGNOSES  ? ?Final diagnoses:  ?Nausea  ?Epigastric pain  ?Idiopathic acute pancreatitis, unspecified complication status  ? ? ? ?Rx / DC Orders  ? ?ED Discharge Orders   ? ?      Ordered  ?  ondansetron (ZOFRAN-ODT) 4 MG disintegrating tablet  Every 8 hours PRN       ? 07/02/21 0648  ? ?  ?  ? ?  ? ? ? ?Note:  This document was prepared using Dragon voice recognition software and may include unintentional dictation errors. ?  ?Vladimir Crofts, MD ?07/02/21 [phone] ? ?

## 2021-07-02 NOTE — Discharge Instructions (Addendum)
Use Tylenol for pain and fevers.  Up to 1000 mg per dose, up to 4 times per day.  Do not take more than 4000 mg of Tylenol/acetaminophen within 24 hours.. ? ?Please use the Zofran as needed for any further nausea or vomiting.  This is the oral form of the nausea medicine you got in the emergency room. ? ?If you develop any further worsening symptoms despite this, please return to the ED. ?

## 2021-07-02 NOTE — ED Triage Notes (Signed)
Pt presents to ER c/o nausea that started after eating some nuts yesterday around 1730.  Pt states after eating the nuts he had an episode of brief abd pain and states he has also had some episodes of diarrhea.  Pt denies any emesis.  Denies allergy to nuts.  Pt is A&O x4 at this time in NAD.   ?

## 2021-07-02 NOTE — ED Provider Notes (Signed)
Asked to follow-up on repeat troponin, unchanged, patient appropriate for discharge, he is anxious to leave ?  ?Lavonia Drafts, MD ?07/02/21 [phone] ? ?

## 2021-07-02 NOTE — ED Notes (Signed)
RN to bedside to introduce self to pt. Pt is CAOx4 and in no acute distress.  ?

## 2021-08-30 ENCOUNTER — Ambulatory Visit (INDEPENDENT_AMBULATORY_CARE_PROVIDER_SITE_OTHER): Payer: Medicare Other | Admitting: Dermatology

## 2021-08-30 DIAGNOSIS — L814 Other melanin hyperpigmentation: Secondary | ICD-10-CM

## 2021-08-30 DIAGNOSIS — C44519 Basal cell carcinoma of skin of other part of trunk: Secondary | ICD-10-CM | POA: Diagnosis not present

## 2021-08-30 DIAGNOSIS — Z1283 Encounter for screening for malignant neoplasm of skin: Secondary | ICD-10-CM

## 2021-08-30 DIAGNOSIS — Z85828 Personal history of other malignant neoplasm of skin: Secondary | ICD-10-CM

## 2021-08-30 DIAGNOSIS — L570 Actinic keratosis: Secondary | ICD-10-CM

## 2021-08-30 DIAGNOSIS — L821 Other seborrheic keratosis: Secondary | ICD-10-CM | POA: Diagnosis not present

## 2021-08-30 DIAGNOSIS — D1800 Hemangioma unspecified site: Secondary | ICD-10-CM

## 2021-08-30 DIAGNOSIS — D229 Melanocytic nevi, unspecified: Secondary | ICD-10-CM

## 2021-08-30 DIAGNOSIS — Z85820 Personal history of malignant melanoma of skin: Secondary | ICD-10-CM

## 2021-08-30 DIAGNOSIS — D489 Neoplasm of uncertain behavior, unspecified: Secondary | ICD-10-CM

## 2021-08-30 DIAGNOSIS — L578 Other skin changes due to chronic exposure to nonionizing radiation: Secondary | ICD-10-CM

## 2021-08-30 DIAGNOSIS — Z86018 Personal history of other benign neoplasm: Secondary | ICD-10-CM

## 2021-08-30 NOTE — Patient Instructions (Addendum)
Actinic keratoses are precancerous spots that appear secondary to cumulative UV radiation exposure/sun exposure over time. They are chronic with expected duration over 1 year. A portion of actinic keratoses will progress to squamous cell carcinoma of the skin. It is not possible to reliably predict which spots will progress to skin cancer and so treatment is recommended to prevent development of skin cancer.  Recommend daily broad spectrum sunscreen SPF 30+ to sun-exposed areas, reapply every 2 hours as needed.  Recommend staying in the shade or wearing long sleeves, sun glasses (UVA+UVB protection) and wide brim hats (4-inch brim around the entire circumference of the hat). Call for new or changing lesions.   Cryotherapy Aftercare  Wash gently with soap and water everyday.   Apply Vaseline and Band-Aid daily until healed.     Electrodesiccation and Curettage ("Scrape and Burn") Wound Care Instructions  Leave the original bandage on for 24 hours if possible.  If the bandage becomes soaked or soiled before that time, it is OK to remove it and examine the wound.  A small amount of post-operative bleeding is normal.  If excessive bleeding occurs, remove the bandage, place gauze over the site and apply continuous pressure (no peeking) over the area for 30 minutes. If this does not work, please call our clinic as soon as possible or page your doctor if it is after hours.   Once a day, cleanse the wound with soap and water. It is fine to shower. If a thick crust develops you may use a Q-tip dipped into dilute hydrogen peroxide (mix 1:1 with water) to dissolve it.  Hydrogen peroxide can slow the healing process, so use it only as needed.    After washing, apply petroleum jelly (Vaseline) or an antibiotic ointment if your doctor prescribed one for you, followed by a bandage.    For best healing, the wound should be covered with a layer of ointment at all times. If you are not able to keep the area  covered with a bandage to hold the ointment in place, this may mean re-applying the ointment several times a day.  Continue this wound care until the wound has healed and is no longer open. It may take several weeks for the wound to heal and close.  Itching and mild discomfort is normal during the healing process.  If you have any discomfort, you can take Tylenol (acetaminophen) or ibuprofen as directed on the bottle. (Please do not take these if you have an allergy to them or cannot take them for another reason).  Some redness, tenderness and white or yellow material in the wound is normal healing.  If the area becomes very sore and red, or develops a thick yellow-green material (pus), it may be infected; please notify us.    Wound healing continues for up to one year following surgery. It is not unusual to experience pain in the scar from time to time during the interval.  If the pain becomes severe or the scar thickens, you should notify the office.    A slight amount of redness in a scar is expected for the first six months.  After six months, the redness will fade and the scar will soften and fade.  The color difference becomes less noticeable with time.  If there are any problems, return for a post-op surgery check at your earliest convenience.  To improve the appearance of the scar, you can use silicone scar gel, cream, or sheets (such as Mederma or [place])  every night for up to one year. These are available over the counter (without a prescription).  Please call our office at [phone] for any questions or concerns.  Biopsy Wound Care Instructions  Leave the original bandage on for 24 hours if possible.  If the bandage becomes soaked or soiled before that time, it is OK to remove it and examine the wound.  A small amount of post-operative bleeding is normal.  If excessive bleeding occurs, remove the bandage, place gauze over the site and apply continuous pressure (no peeking) over the  area for 30 minutes. If this does not work, please call our clinic as soon as possible or page your doctor if it is after hours.   Once a day, cleanse the wound with soap and water. It is fine to shower. If a thick crust develops you may use a Q-tip dipped into dilute hydrogen peroxide (mix 1:1 with water) to dissolve it.  Hydrogen peroxide can slow the healing process, so use it only as needed.    After washing, apply petroleum jelly (Vaseline) or an antibiotic ointment if your doctor prescribed one for you, followed by a bandage.    For best healing, the wound should be covered with a layer of ointment at all times. If you are not able to keep the area covered with a bandage to hold the ointment in place, this may mean re-applying the ointment several times a day.  Continue this wound care until the wound has healed and is no longer open.   Itching and mild discomfort is normal during the healing process. However, if you develop pain or severe itching, please call our office.   If you have any discomfort, you can take Tylenol (acetaminophen) or ibuprofen as directed on the bottle. (Please do not take these if you have an allergy to them or cannot take them for another reason).  Some redness, tenderness and white or yellow material in the wound is normal healing.  If the area becomes very sore and red, or develops a thick yellow-green material (pus), it may be infected; please notify us.    If you have stitches, return to clinic as directed to have the stitches removed. You will continue wound care for 2-3 days after the stitches are removed.   Wound healing continues for up to one year following surgery. It is not unusual to experience pain in the scar from time to time during the interval.  If the pain becomes severe or the scar thickens, you should notify the office.    A slight amount of redness in a scar is expected for the first six months.  After six months, the redness will fade and the  scar will soften and fade.  The color difference becomes less noticeable with time.  If there are any problems, return for a post-op surgery check at your earliest convenience.  To improve the appearance of the scar, you can use silicone scar gel, cream, or sheets (such as Mederma or Serica) every night for up to one year. These are available over the counter (without a prescription).  Please call our office at [phone] for any questions or concerns.          Melanoma ABCDEs  Melanoma is the most dangerous type of skin cancer, and is the leading cause of death from skin disease.  You are more likely to develop melanoma if you: Have light-colored skin, light-colored eyes, or red or blond hair Spend a lot of  time in the sun Tan regularly, either outdoors or in a tanning bed Have had blistering sunburns, especially during childhood Have a close family member who has had a melanoma Have atypical moles or large birthmarks  Early detection of melanoma is key since treatment is typically straightforward and cure rates are extremely high if we catch it early.   The first sign of melanoma is often a change in a mole or a new dark spot.  The ABCDE system is a way of remembering the signs of melanoma.  A for asymmetry:  The two halves do not match. B for border:  The edges of the growth are irregular. C for color:  A mixture of colors are present instead of an even brown color. D for diameter:  Melanomas are usually (but not always) greater than 4m - the size of a pencil eraser. E for evolution:  The spot keeps changing in size, shape, and color.  Please check your skin once per month between visits. You can use a small mirror in front and a large mirror behind you to keep an eye on the back side or your body.   If you see any new or changing lesions before your next follow-up, please call to schedule a visit.  Please continue daily skin protection including broad spectrum sunscreen  SPF 30+ to sun-exposed areas, reapplying every 2 hours as needed when you're outdoors.   Staying in the shade or wearing long sleeves, sun glasses (UVA+UVB protection) and wide brim hats (4-inch brim around the entire circumference of the hat) are also recommended for sun protection.      Due to recent changes in healthcare laws, you may see results of your pathology and/or laboratory studies on MyChart before the doctors have had a chance to review them. We understand that in some cases there may be results that are confusing or concerning to you. Please understand that not all results are received at the same time and often the doctors may need to interpret multiple results in order to provide you with the best plan of care or course of treatment. Therefore, we ask that you please give u[place]2 business days to thoroughly review all your results before contacting the office for clarification. Should we see a critical lab result, you will be contacted sooner.   If You Need Anything After Your Visit  If you have any questions or concerns for your doctor, please call our main line at [phone] press option 4 to reach your doctor's medical assistant. If no one answers, please leave a voicemail as directed and we will return your call as soon as possible. Messages left after 4 pm will be answered the following business day.   You may also send [place] message via [place] We typically respond to MyChart messages within 1-2 business days.  For prescription refills, please ask your pharmacy to contact our office. Our fax number is [phone]  If you have an urgent issue when the clinic is closed that cannot wait until the next business day, you can page your doctor at the number below.    Please note that while we do our best to be available for urgent issues outside of office hours, we are not available 24/7.   If you have an urgent issue and are unable to reach u[place] you may choose to seek medical  care at your doctor's office, retail clinic, urgent care center, or emergency room.  If you have a medical  emergency, please immediately call 911 or go to the emergency department.  Pager Numbers  - Dr. Nehemiah Massed: [phone]  - Dr. Laurence Ferrari: [phone]  - Dr. Nicole Kindred: [phone]  In the event of inclement weather, please call our main line at [phone] for an update on the status of any delays or closures.  Dermatology Medication Tips: Please keep the boxes that topical medications come in in order to help keep track of the instructions about where and how to use these. Pharmacies typically print the medication instructions only on the boxes and not directly on the medication tubes.   If your medication is too expensive, please contact our office at [phone] option 4 or send [place] a message through [place].   We are unable to tell what your co-pay for medications will be in advance as this is different depending on your insurance coverage. However, we may be able to find a substitute medication at lower cost or fill out paperwork to get insurance to cover a needed medication.   If a prior authorization is required to get your medication covered by your insurance company, please allow [place] 1-2 business days to complete this process.  Drug prices often vary depending on where the prescription is filled and some pharmacies may offer cheaper prices.  The website www.goodrx.com contains coupons for medications through different pharmacies. The prices here do not account for what the cost may be with help from insurance (it may be cheaper with your insurance), but the website can give you the price if you did not use any insurance.  - You can print the associated coupon and take it with your prescription to the pharmacy.  - You may also stop by our office during regular business hours and pick up a GoodRx coupon card.  - If you need your prescription sent electronically to a different  pharmacy, notify our office through [facility] or by phone at [phone] option 4.     Si Usted Necesita Algo Despus de Su Visita  Tambin puede enviarnos un mensaje a travs de Pharmacist, community. Por lo general respondemos a los mensajes de MyChart en el transcurso de 1 a 2 das hbiles.  Para renovar recetas, por favor pida a su farmacia que se ponga en contacto con nuestra oficina. Harland Dingwall de fax es [place] [phone].  Si tiene un asunto urgente cuando la clnica est cerrada y que no puede esperar hasta el siguiente da hbil, puede llamar/localizar a su doctor(a) al nmero que aparece a continuacin.   Por favor, tenga en cuenta que aunque hacemos todo lo posible para estar disponibles para asuntos urgentes fuera del horario de [place], no estamos disponibles las 24 horas del da, los 7 das de la [place].   Si tiene un problema urgente y no puede comunicarse con nosotros, puede optar por buscar atencin mdica  en el consultorio de su doctor(a), en una clnica privada, en un centro de atencin urgente o en una sala de emergencias.  Si tiene Engineering geologist, por favor llame inmediatamente al 911 o vaya a la sala de emergencias.  Nmeros de bper  - Dr. Nehemiah Massed: [phone]  - Dra. Moye: [phone]  - Dra. Nicole Kindred: [phone]  En caso de inclemencias del [place], por favor llame a Johnsie Kindred principal al [phone] para una actualizacin sobre el [place] de cualquier retraso o cierre.  Consejos para la medicacin en dermatologa: Por favor, guarde las cajas en las que vienen los medicamentos de uso tpico para ayudarle a seguir  las instrucciones sobre dnde y cmo usarlos. Las farmacias generalmente imprimen las instrucciones del medicamento slo en las cajas y no directamente en los tubos del [place].   Si su medicamento es muy caro, por favor, pngase en contacto con Zigmund Daniel llamando al [phone] y presione la opcin 4 o envenos un mensaje a  travs de Pharmacist, community.   No podemos decirle cul ser su copago por los medicamentos por adelantado ya que esto es diferente dependiendo de la cobertura de su seguro. Sin embargo, es posible que podamos encontrar un medicamento sustituto a Electrical engineer un formulario para que el seguro cubra el medicamento que se considera necesario.   Si se requiere una autorizacin previa para que su compaa de seguros [place] su medicamento, por favor permtanos de 1 a 2 das hbiles para completar este proceso.  Los precios de los medicamentos varan con frecuencia dependiendo del Environmental consultant de dnde se surte la receta y alguna farmacias pueden ofrecer precios ms baratos.  El sitio web www.goodrx.com tiene cupones para medicamentos de Airline pilot. Los precios aqu no tienen en cuenta lo que podra costar con la ayuda del seguro (puede ser ms barato con su seguro), pero el sitio web puede darle el precio si no utiliz Research scientist (physical sciences).  - Puede imprimir el cupn correspondiente y llevarlo con su receta a la farmacia.  - Tambin puede pasar por nuestra oficina durante el horario de atencin regular y Charity fundraiser una tarjeta de cupones de GoodRx.  - Si necesita que su receta se enve electrnicamente a una farmacia diferente, informe a nuestra oficina a travs de MyChart de [place] o por telfono llamando al [phone] y presione la opcin 4.

## 2021-08-30 NOTE — Progress Notes (Unsigned)
Follow-Up Visit   Subjective  Wesley Young is a 81 y.o. male who presents for the following: Annual Exam (1 year tbse, hx of aks, has a skin tag at neck he would like to have treated. ). The patient presents for Total-Body Skin Exam (TBSE) for skin cancer screening and mole check.  The patient has spots, moles and lesions to be evaluated, some may be new or changing and the patient has concerns that these could be cancer.  The following portions of the chart were reviewed this encounter and updated as appropriate:  Tobacco  Allergies  Meds  Problems  Med Hx  Surg Hx  Fam Hx     Review of Systems: No other skin or systemic complaints except as noted in HPI or Assessment and Plan.  Objective  Well appearing patient in no apparent distress; mood and affect are within normal limits.  A full examination was performed including scalp, head, eyes, ears, nose, lips, neck, chest, axillae, abdomen, back, buttocks, bilateral upper extremities, bilateral lower extremities, hands, feet, fingers, toes, fingernails, and toenails. All findings within normal limits unless otherwise noted below.  supersternal area 0.6 cm crusted papule        face and hands x 16 (16) Erythematous thin papules/macules with gritty scale.    Assessment & Plan  Neoplasm of uncertain behavior supersternal area  Epidermal / dermal shaving  Lesion diameter (cm):  0.6 Informed consent: discussed and consent obtained   Timeout: patient name, date of birth, surgical site, and procedure verified   Procedure prep:  Patient was prepped and draped in usual sterile fashion Prep type:  Isopropyl alcohol Anesthesia: the lesion was anesthetized in a standard fashion   Anesthetic:  1% lidocaine w/ epinephrine 1-100,000 buffered w/ 8.4% NaHCO3 Instrument used: flexible razor blade   Hemostasis achieved with: pressure, aluminum chloride and electrodesiccation   Outcome: patient tolerated procedure well    Post-procedure details: sterile dressing applied and wound care instructions given   Dressing type: bandage and petrolatum    Destruction of lesion Complexity: extensive   Destruction method: electrodesiccation and curettage   Informed consent: discussed and consent obtained   Timeout:  patient name, date of birth, surgical site, and procedure verified Procedure prep:  Patient was prepped and draped in usual sterile fashion Prep type:  Isopropyl alcohol Anesthesia: the lesion was anesthetized in a standard fashion   Anesthetic:  1% lidocaine w/ epinephrine 1-100,000 buffered w/ 8.4% NaHCO3 Curettage performed in three different directions: Yes   Electrodesiccation performed over the curetted area: Yes   Lesion length (cm):  0.6 Lesion width (cm):  0.6 Margin per side (cm):  0.2 Final wound size (cm):  1 Hemostasis achieved with:  pressure, aluminum chloride and electrodesiccation Outcome: patient tolerated procedure well with no complications   Post-procedure details: sterile dressing applied and wound care instructions given   Dressing type: bandage and petrolatum    Specimen 1 - Surgical pathology Differential Diagnosis: r/o SCC   Check Margins: No  R/o scc   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 They are chronic with expected duration over 1 year. A portion of actinic keratoses will progress to squamous cell carcinoma of the skin. It is not possible to reliably predict which spots will progress to skin cancer and so treatment is recommended to prevent development of skin cancer.  Recommend daily broad spectrum sunscreen  SPF 30+ to sun-exposed areas, reapply every 2 hours as needed.  Recommend staying in the shade or wearing long sleeves, sun glasses (UVA+UVB protection) and wide brim hats (4-inch brim around the entire circumference of the  hat). Call for new or changing lesions.  Destruction of lesion - face and hands x 16 Complexity: simple   Destruction method: cryotherapy   Informed consent: discussed and consent obtained   Timeout:  patient name, date of birth, surgical site, and procedure verified Lesion destroyed using liquid nitrogen: Yes   Region frozen until ice ball extended beyond lesion: Yes   Outcome: patient tolerated procedure well with no complications   Post-procedure details: wound care instructions given   Additional details:  Prior to procedure, discussed risks of blister formation, small wound, skin dyspigmentation, or rare scar following cryotherapy. Recommend Vaseline ointment to treated areas while healing.   Skin cancer screening  Lentigines - Scattered tan macules - Due to sun exposure - Benign-appearing, observe - Recommend daily broad spectrum sunscreen SPF 30+ to sun-exposed areas, reapply every 2 hours as needed. - Call for any changes  Seborrheic Keratoses - Stuck-on, waxy, tan-brown papules and/or plaques  - Benign-appearing - Discussed benign etiology and prognosis. - Observe - Call for any changes  Melanocytic Nevi - Tan-brown and/or pink-flesh-colored symmetric macules and papules - Benign appearing on exam today - Observation - Call clinic for new or changing moles - Recommend daily use of broad spectrum spf 30+ sunscreen to sun-exposed areas.   Hemangiomas - Red papules - Discussed benign nature - Observe - Call for any changes  Actinic Damage - Chronic condition, secondary to cumulative UV/sun exposure - diffuse scaly erythematous macules with underlying dyspigmentation - Recommend daily broad spectrum sunscreen SPF 30+ to sun-exposed areas, reapply every 2 hours as needed.  - Staying in the shade or wearing long sleeves, sun glasses (UVA+UVB protection) and wide brim hats (4-inch brim around the entire circumference of the hat) are also recommended for sun  protection.  - Call for new or changing lesions.  History of Melanoma  Left upper back paraspinal 2016 - No evidence of recurrence today - No lymphadenopathy - Recommend regular full body skin exams - Recommend daily broad spectrum sunscreen SPF 30+ to sun-exposed areas, reapply every 2 hours as needed.  - Call if any new or changing lesions are noted between office visits  History of Basal Cell Carcinoma of the Skin Multiple locations see history  - No evidence of recurrence today - Recommend regular full body skin exams - Recommend daily broad spectrum sunscreen SPF 30+ to sun-exposed areas, reapply every 2 hours as needed.  - Call if any new or changing lesions are noted between office visits  History of Squamous Cell Carcinoma of the Skin Multiple locations see history  - No evidence of recurrence today - No lymphadenopathy - Recommend regular full body skin exams - Recommend daily broad spectrum sunscreen SPF 30+ to sun-exposed areas, reapply every 2 hours as needed.  - Call if any new or changing lesions are noted between office visits  History of Dysplastic Nevi Multiple locations see history  - No evidence of recurrence today - Recommend regular full body skin exams - Recommend daily broad spectrum sunscreen SPF 30+ to sun-exposed areas, reapply every 2 hours as needed.  - Call if any new or changing lesions are noted between office visits  Skin cancer screening performed today.  Return in about 1 year (around 08/31/2022) for TBSE. I, Ruthell Rummage,  CMA, am acting as scribe for Sarina Ser, MD. Documentation: I have reviewed the above documentation for accuracy and completeness, and I agree with the above.  Sarina Ser, MD

## 2021-09-02 ENCOUNTER — Encounter: Payer: Self-pay | Admitting: Dermatology

## 2021-09-07 ENCOUNTER — Encounter: Payer: Self-pay | Admitting: Dermatology

## 2021-09-07 ENCOUNTER — Telehealth: Payer: Self-pay

## 2021-09-07 NOTE — Telephone Encounter (Signed)
LM on VM please return my call  

## 2021-09-07 NOTE — Telephone Encounter (Signed)
-----   Message from Ralene Bathe, MD sent at 09/07/2021  2:01 PM EDT ----- Diagnosis Skin , suprasternal area BASAL CELL CARCINOMA, NODULAR PATTERN  Cancer - BCC Already treated Recheck next visit

## 2021-09-08 ENCOUNTER — Telehealth: Payer: Self-pay

## 2021-09-08 NOTE — Telephone Encounter (Signed)
Patient advised of BX result. aw 

## 2021-09-08 NOTE — Telephone Encounter (Signed)
-----   Message from Ralene Bathe, MD sent at 09/07/2021  2:01 PM EDT ----- Diagnosis Skin , suprasternal area BASAL CELL CARCINOMA, NODULAR PATTERN  Cancer - BCC Already treated Recheck next visit

## 2021-10-05 ENCOUNTER — Emergency Department: Payer: Medicare Other

## 2021-10-05 ENCOUNTER — Encounter: Payer: Self-pay | Admitting: Emergency Medicine

## 2021-10-05 ENCOUNTER — Other Ambulatory Visit: Payer: Self-pay

## 2021-10-05 ENCOUNTER — Emergency Department
Admission: EM | Admit: 2021-10-05 | Discharge: 2021-10-05 | Discharge status: Against Medical Advice | Payer: Medicare Other | Attending: Emergency Medicine | Admitting: Emergency Medicine

## 2021-10-05 DIAGNOSIS — R531 Weakness: Secondary | ICD-10-CM | POA: Diagnosis present

## 2021-10-05 DIAGNOSIS — Z5321 Procedure and treatment not carried out due to patient leaving prior to being seen by health care provider: Secondary | ICD-10-CM | POA: Diagnosis not present

## 2021-10-05 DIAGNOSIS — W1830XA Fall on same level, unspecified, initial encounter: Secondary | ICD-10-CM | POA: Insufficient documentation

## 2021-10-05 LAB — CBC WITH DIFFERENTIAL/PLATELET
Abs Immature Granulocytes: 0.03 10*3/uL (ref 0.00–0.07)
Basophils Absolute: 0.1 10*3/uL (ref 0.0–0.1)
Basophils Relative: 1 %
Eosinophils Absolute: 0.1 10*3/uL (ref 0.0–0.5)
Eosinophils Relative: 2 %
HCT: 41 % (ref 39.0–52.0)
Hemoglobin: 13.2 g/dL (ref 13.0–17.0)
Immature Granulocytes: 0 %
Lymphocytes Relative: 15 %
Lymphs Abs: 1.1 10*3/uL (ref 0.7–4.0)
MCH: 30.6 pg (ref 26.0–34.0)
MCHC: 32.2 g/dL (ref 30.0–36.0)
MCV: 94.9 fL (ref 80.0–100.0)
Monocytes Absolute: 0.5 10*3/uL (ref 0.1–1.0)
Monocytes Relative: 8 %
Neutro Abs: 5.1 10*3/uL (ref 1.7–7.7)
Neutrophils Relative %: 74 %
Platelets: 262 10*3/uL (ref 150–400)
RBC: 4.32 MIL/uL (ref 4.22–5.81)
RDW: 12.4 % (ref 11.5–15.5)
WBC: 6.9 10*3/uL (ref 4.0–10.5)
nRBC: 0 % (ref 0.0–0.2)

## 2021-10-05 LAB — BASIC METABOLIC PANEL
Anion gap: 7 (ref 5–15)
BUN: 16 mg/dL (ref 8–23)
CO2: 28 mmol/L (ref 22–32)
Calcium: 10.1 mg/dL (ref 8.9–10.3)
Chloride: 104 mmol/L (ref 98–111)
Creatinine, Ser: 0.75 mg/dL (ref 0.61–1.24)
GFR, Estimated: >60 mL/min (ref >60)
Glucose, Bld: 149 mg/dL — ABNORMAL HIGH (ref 70–99)
Potassium: 4.5 mmol/L (ref 3.5–5.1)
Sodium: 139 mmol/L (ref 135–145)

## 2021-10-05 NOTE — ED Notes (Signed)
No answer when called several times from lobby 

## 2021-10-05 NOTE — ED Provider Triage Note (Signed)
  Emergency Medicine Provider Triage Evaluation Note  Wesley Young , a 81 y.o.male,  was evaluated in triage.  Pt complains of bilateral leg weakness since Sunday morning.  Patient states that his legs suddenly collapsed that day, similar to the time that he was diagnosed with cauda equina.  He was told by his regular doctor to visit the emergency department immediately for MRI.  Patient states that he is still able to walk, though his legs are both weaker than usual.  No bowel/bladder dysfunction   Review of Systems  Positive: Bilateral leg weakness Negative: Denies fever, chest pain, vomiting  Physical Exam   Vitals:   10/05/21 1652  BP: (!) 148/63  Pulse: 82  Resp: 18  Temp: 98.4 F (36.9 C)  SpO2: 99%   Gen:   Awake, no distress   Resp:  Normal effort  MSK:   Moves extremities without difficulty  Other:    Medical Decision Making  Given the patient's initial medical screening exam, the following diagnostic evaluation has been ordered. The patient will be placed in the appropriate treatment space, once one is available, to complete the evaluation and treatment. I have discussed the plan of care with the patient and I have advised the patient that an ED physician or mid-level practitioner will reevaluate their condition after the test results have been received, as the results may give them additional insight into the type of treatment they may need.    Diagnostics: MRI lumbar, labs  Treatments: none immediately   Teodoro Spray, [place] 10/05/21 1811

## 2021-10-05 NOTE — ED Triage Notes (Signed)
Pt reports Sunday morning his legs became weak and he fell to his knees. PT was sent by Dr. Manuella Ghazi for MRI of spine.

## 2021-10-06 ENCOUNTER — Telehealth: Payer: Self-pay

## 2021-10-06 NOTE — Telephone Encounter (Signed)
Pt's wife confirmed appt for 10/07/2021 at 4pm.

## 2021-10-06 NOTE — Telephone Encounter (Signed)
-----   Message from Peggyann Shoals sent at 10/06/2021  8:40 AM EDT ----- Regarding: work in [place] from [employer] office calling. Dr.Shah is requesting pt to be seen urgently. Patient is experiencing symptoms of cauda equina again, which he has had in the past and had surgery. He just had a MRI yesterday. How soon can he be worked in? Call him at [phone] or [phone]. #Courtney is aware Dr.Yarbrough is in surgery today, she will notify Dr. Manuella Ghazi

## 2021-10-06 NOTE — Telephone Encounter (Signed)
Per documentation from neurology, he is having weakness below the waist and left leg symptoms, but no bowel/bladder incontinence.

## 2021-10-06 NOTE — Progress Notes (Unsigned)
Referring Physician:  Vladimir Crofts, MD [place] [place],  [place] [address]  Primary Physician:  Kirk Ruths, MD  History of Present Illness: 10/06/2021 Wesley Young is here today with a chief complaint of low back pain with weakness in the left leg.  He had an event on August 13 when he had an electrical sensation down his leg with weakness and numbness.  He has gotten better since that time, but still has discomfort when he walks or stands.  Sitting down makes his pain better.  He currently has 2 out of 10 pain.  He has no bowel or bladder dysfunction currently.   Bowel/Bladder Dysfunction: none  Conservative measures:  Physical therapy:  has not participated in Multimodal medical therapy including regular antiinflammatories: tramadol Injections: has had epidural steroid injections in 2013 but none for this problem.   Past Surgery:  08/23/19: L4 laminectomy by Dr. Ignacia Bayley has no symptoms of cervical myelopathy.  The symptoms are causing a significant impact on the patient's life.   Review of Systems:  A 10 point review of systems is negative, except for the pertinent positives and negatives detailed in the HPI.  Past Medical History: Past Medical History:  Diagnosis Date   Abnormal PSA    s/p post prostate biopsy in the past which was negative   Actinic keratosis 03/14/2007   L ant lat neck at base of neck - bx proven    Actinic keratosis 01/08/2014   R calf - bx proven    Alcoholism ([place])    with prolonged hospitalization 2009 for withdrawal c/b aspiration pneumonia requiring vent   Anxiety    Atrial fibrillation and flutter ([place])    B12 deficiency    Basal cell carcinoma 03/14/2007   R distal lat tricep near elbow    Basal cell carcinoma 03/06/2014   R calf - excision 04/22/2014   Basal cell carcinoma 08/30/2021   suprasternal area, treated with EDC   Cardiomyopathy ([place])    mild probably  multifactorial secondary to HTn and possible alcohol contribution. 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   L upper back paraspinal - excision    Hx of squamous cell carcinoma of skin 2014   multiple sites   Hypertension    Impotence    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;  Service: Ophthalmology;  Laterality: Left;  sleep apnea   CATARACT EXTRACTION W/PHACO Right 04/09/2019   Procedure: CATARACT EXTRACTION PHACO AND INTRAOCULAR LENS PLACEMENT (IOC) RIGHT 4.84 00:52.4;  Surgeon: Birder Robson, MD;  Location: [place];  Service: Ophthalmology;  Laterality: Right;   colonoscopy with polypectomy  12/2016   COLONOSCOPY WITH PROPOFOL N/A 12/27/2016   Procedure: COLONOSCOPY WITH PROPOFOL;  Surgeon: Toledo, Benay Pike, MD;  Location: [facility] ENDOSCOPY;  Service: Endoscopy;  Laterality: N/A;   COLONOSCOPY WITH PROPOFOL N/A 03/23/2020   Procedure: COLONOSCOPY WITH PROPOFOL;  Surgeon: Toledo,  Benay Pike, MD;  Location: [facility] ENDOSCOPY;  Service: Gastroenterology;  Laterality: N/A;   ESOPHAGOGASTRODUODENOSCOPY (EGD) WITH PROPOFOL N/A 09/21/2017   Procedure: ESOPHAGOGASTRODUODENOSCOPY (EGD) WITH PROPOFOL;  Surgeon: Lollie Sails, MD;  Location: [facility] ENDOSCOPY;  Service: Endoscopy;  Laterality: N/A;   ESOPHAGOGASTRODUODENOSCOPY (EGD) WITH PROPOFOL N/A 03/23/2020   Procedure: ESOPHAGOGASTRODUODENOSCOPY (EGD) WITH PROPOFOL;  Surgeon: Toledo, Benay Pike, MD;  Location: [facility] ENDOSCOPY;  Service: Gastroenterology;  Laterality: N/A;   melonoma removal     prostat biopsy x2     right deltoid resection in 1977     s/p resection of his right deltoid muscle in [place]      Allergies: Allergies as of 10/07/2021 - Review Complete 10/05/2021  Allergen Reaction Noted   Ativan [lorazepam]  03/20/2020   Benadryl [diphenhydramine]  08/07/2019   Crestor [rosuvastatin calcium]  09/20/2017   Nsaids  09/20/2017   Peanut-containing drug products Swelling 08/21/2019   Rapaflo [silodosin]  09/20/2017   Statins Other (See Comments) 12/27/2016   Zetia [ezetimibe]  09/20/2017   Latex Rash 12/27/2016   Neosporin [neomycin-bacitracin zn-polymyx] Rash 12/27/2016    Medications: No outpatient medications have been marked as taking for the 10/07/21 encounter (Appointment) with Meade Maw, MD.    Social History: Social History   Tobacco Use   Smoking status: Former    Types: Cigarettes    Quit date: 1977    Years since quitting: 46.6   Smokeless tobacco: Never  Vaping Use   Vaping Use: Never used  Substance Use Topics   Alcohol use: Not Currently    Comment: quit 03/26/2008   Drug use: Not Currently    Family Medical History: No family history on file.  Physical Examination: There were no vitals filed for this visit.  General: Patient is well developed, well nourished, calm, collected, and in no apparent distress. Attention to  examination is appropriate.  Neck:   Supple.  Full range of motion.  Respiratory: Patient is breathing without any difficulty.   NEUROLOGICAL:     Awake, alert, oriented to person, place, and time.  Speech is clear and fluent. Fund of knowledge is appropriate.   Cranial Nerves: Pupils equal round and reactive to light.  Facial tone is symmetric.  Facial sensation is symmetric. Shoulder shrug is symmetric. Tongue protrusion is midline.  There is no pronator drift.  Strength: Side Biceps Triceps Deltoid Interossei Grip Wrist Ext. Wrist Flex.  R '5 5 5 5 5 5 5  '$ L '5 5 5 5 5 5 5   '$ Side Iliopsoas Quads Hamstring PF DF EHL  R '5 5 5 5 5 5  '$ L '5 5 5 5 5 5   '$ Reflexes are 1+ and symmetric at the biceps, triceps, brachioradialis, patella and achilles.  Hoffman's is absent.  Clonus is not present.  Toes are down-going.  Bilateral upper and lower extremity sensation is intact to light touch.    No evidence of dysmetria noted.  Gait is normal and requires a cane.     Medical Decision Making  Imaging: MRI L spine 10/05/21 1. Severe degenerative findings with severe spinal canal stenosis at L3-L4 and L4-L5, which raises concern for cauda equina syndrome, although this appears unchanged compared to 11/11/2019. In addition there is mild bilateral neural foraminal narrowing at these levels. 2. L1-L2 moderate spinal canal stenosis and mild-to-moderate bilateral neural foraminal narrowing.     Electronically Signed   By: Merilyn Baba M.D.   On: 10/05/2021 20:46  I have personally reviewed the images and agree with the above interpretation.  Assessment and Plan: Wesley Young is a pleasant 81 y.o. 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  He does not currently have cauda equina syndrome.  He does not have overt weakness on my examination.  I think he is appropriate to consider physical therapy and injections.  He does not wish to have  an injection.  I will send a referral for physical therapy and see him back in 8 weeks.  I will see him sooner if he has worsening issues.  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  Given his age, we would focus on decompression if possible.   I spent a total of 30 minutes in face-to-face and non-face-to-face activities related to this patient's care today.  Thank you for involving me in the care of this patient.      Wesley Young K. Izora Ribas MD, [facility] Neurosurgery

## 2021-10-07 ENCOUNTER — Ambulatory Visit (INDEPENDENT_AMBULATORY_CARE_PROVIDER_SITE_OTHER): Payer: Medicare Other | Admitting: Neurosurgery

## 2021-10-07 ENCOUNTER — Encounter: Payer: Self-pay | Admitting: Neurosurgery

## 2021-10-07 VITALS — BP 138/86 | Ht 72.0 in | Wt 179.4 lb

## 2021-10-07 DIAGNOSIS — M48062 Spinal stenosis, lumbar region with neurogenic claudication: Secondary | ICD-10-CM | POA: Diagnosis not present

## 2021-10-19 ENCOUNTER — Ambulatory Visit: Payer: Medicare Other | Attending: Neurosurgery | Admitting: Physical Therapy

## 2021-10-19 ENCOUNTER — Encounter: Payer: Self-pay | Admitting: Physical Therapy

## 2021-10-19 ENCOUNTER — Other Ambulatory Visit: Payer: Self-pay

## 2021-10-19 DIAGNOSIS — M48062 Spinal stenosis, lumbar region with neurogenic claudication: Secondary | ICD-10-CM | POA: Diagnosis not present

## 2021-10-19 DIAGNOSIS — M5459 Other low back pain: Secondary | ICD-10-CM | POA: Diagnosis present

## 2021-10-19 DIAGNOSIS — R2681 Unsteadiness on feet: Secondary | ICD-10-CM | POA: Diagnosis present

## 2021-10-19 NOTE — Therapy (Signed)
OUTPATIENT PHYSICAL THERAPY THORACOLUMBAR EVALUATION   Patient Name: Wesley Young MRN: [id number] DOB:1940/03/15, 81 y.o., male Today's Date: 10/19/2021   PT End of Session - 10/19/21 1706     Visit Number 1    Number of Visits 16    Date for PT Re-Evaluation 12/14/21    Authorization Type Medicare    Progress Note Due on Visit 10    PT Start Time 1330    PT Stop Time 9518    PT Time Calculation (min) 45 min    Activity Tolerance Patient tolerated treatment well    Behavior During Therapy [facility] for tasks assessed/performed             Past Medical History:  Diagnosis Date   Abnormal PSA    s/p post prostate biopsy in the past which was negative   Actinic keratosis 03/14/2007   L ant lat neck at base of neck - bx proven    Actinic keratosis 01/08/2014   R calf - bx proven    Alcoholism ([place])    with prolonged hospitalization 2009 for withdrawal c/b aspiration pneumonia requiring vent   Anxiety    Atrial fibrillation and flutter ([place])    B12 deficiency    Basal cell carcinoma 03/14/2007   R distal lat tricep near elbow    Basal cell carcinoma 03/06/2014   R calf - excision 04/22/2014   Basal cell carcinoma 08/30/2021   suprasternal area, treated with EDC   Cardiomyopathy ([place])    mild probably multifactorial secondary to HTn and possible alcohol contribution. 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   L upper back paraspinal - excision    Hx of squamous cell carcinoma of skin 2014   multiple sites   Hypertension     Impotence    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[place])    Sleep apnea    Spinal stenosis    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   L forearm - excision    Squamous cell carcinoma of skin 12/16/2015   R dorsum hand    Squamous cell carcinoma of skin 10/02/2017   R dorsum hand    Varicose veins of both lower extremities    Past Surgical History:  Procedure Laterality Date   CATARACT EXTRACTION W/PHACO Left 03/12/2019   Procedure: CATARACT EXTRACTION PHACO AND INTRAOCULAR LENS PLACEMENT ([place]) LEFT 5.27 00:38.6;  Surgeon: Birder Robson, MD;  Location: [place];  Service: Ophthalmology;  Laterality: Left;  sleep apnea   CATARACT EXTRACTION W/PHACO Right 04/09/2019   Procedure: CATARACT EXTRACTION PHACO AND INTRAOCULAR LENS PLACEMENT (IOC) RIGHT 4.84 00:52.4;  Surgeon: Birder Robson, MD;  Location: [place];  Service: Ophthalmology;  Laterality: Right;   colonoscopy with polypectomy  12/2016   COLONOSCOPY WITH PROPOFOL N/A 12/27/2016   Procedure: COLONOSCOPY WITH PROPOFOL;  Surgeon: Toledo, Benay Pike, MD;  Location: [facility] ENDOSCOPY;  Service: Endoscopy;  Laterality:  N/A;   COLONOSCOPY WITH PROPOFOL N/A 03/23/2020   Procedure: COLONOSCOPY WITH PROPOFOL;  Surgeon: Toledo, Benay Pike, MD;  Location: [facility] ENDOSCOPY;  Service: Gastroenterology;  Laterality: N/A;   ESOPHAGOGASTRODUODENOSCOPY (EGD) WITH PROPOFOL N/A 09/21/2017   Procedure: ESOPHAGOGASTRODUODENOSCOPY (EGD) WITH PROPOFOL;  Surgeon: Lollie Sails, MD;  [facility] ENDOSCOPY;  Service: Endoscopy;  Laterality: N/A;   ESOPHAGOGASTRODUODENOSCOPY (EGD) WITH PROPOFOL N/A 03/23/2020   Procedure: ESOPHAGOGASTRODUODENOSCOPY (EGD) WITH PROPOFOL;  Surgeon: Toledo, Benay Pike, MD;  Location: [facility] ENDOSCOPY;  Service: Gastroenterology;  Laterality: N/A;   melonoma removal     prostat biopsy x2     right deltoid resection in 1977     s/p resection of his right deltoid muscle in [place]     There are no problems to display for this patient.   PCP: Dr. Frazier Richards   REFERRING PROVIDER: Dr. Meade Maw   REFERRING DIAG: [phone] (ICD-10-CM) - Neurogenic claudication due to lumbar spinal stenosis  Rationale for Evaluation and Treatment Rehabilitation  THERAPY DIAG:  Unsteadiness on feet  Other low back pain  ONSET DATE: 2008   SUBJECTIVE:                                                                                                                                                                                           SUBJECTIVE STATEMENT: See pertinent history  PERTINENT HISTORY:  Low back pain started 2008 after pt started feeling pain in both his hips.  He use to run 10 miles a day when he was running and he things this might have been the cause of his low back pain.  Pt reports past spinal surgery two years ago to relieve pressure in spine because of bowel and bladder issues. He has already received a spinal injection 10 years ago, but this did not help much. He states that he is currently on tramadol which does help with the pain. He went off tramadol for a few months because of irregular blood pressure and he experienced a significant increase in his pain and it did not help blood pressure. Has not fallen   PAIN:  Are you having pain? Yes: NPRS scale: 5-6/10 Pain location: L1-L5 Central Spinous Process  Pain description: Dull  Aggravating factors: Not sure  Relieving factors: Tramadol    PRECAUTIONS: None  WEIGHT BEARING RESTRICTIONS No  FALLS:  Has patient fallen in last 6 months? No  LIVING ENVIRONMENT: Lives with: lives with their spouse Lives in: House/apartment Stairs: Yes: Internal: 13 steps; on right going up and External: 5 steps; on  right going up Has following equipment at home: Single point cane and FWW  OCCUPATION: Retired   PLOF: Independent  PATIENT GOALS Better balance and better control of legs.     OBJECTIVE:               VITALS: BP 163/ 68 HR  68 SpO2 100  DIAGNOSTIC FINDINGS:  MRI L spine 10/05/21 1. Severe degenerative findings with severe spinal canal stenosis at L3-L4 and L4-L5, which raises concern for cauda equina syndrome, although this appears unchanged compared to 11/11/2019. In addition there is mild bilateral neural foraminal narrowing at these levels. 2. L1-L2 moderate spinal canal stenosis and mild-to-moderate bilateral neural foraminal narrowing.  PATIENT SURVEYS:  FOTO 56/100 with target of 60   SCREENING FOR RED FLAGS: Bowel or bladder incontinence: No Spinal tumors: No Cauda equina syndrome: No Compression fracture: No Abdominal aneurysm: No  COGNITION:  Overall cognitive status: Within functional limits for tasks assessed     SENSATION: WFLfeels some numbness and tingling in toes   MUSCLE LENGTH: Hamstrings: NT  Thomas test: NT   POSTURE: No Significant postural limitations  PALPATION: L1-L5 central spinous processes   LUMBAR ROM:   Active  A/PROM  eval  Flexion 100%  Extension 50%  Right lateral flexion 75%  Left lateral flexion 75%  Right rotation 75%  Left rotation 75%   (Blank rows = not tested)  LOWER EXTREMITY ROM:   WFL      LOWER EXTREMITY MMT:     Active  Right eval Left eval  Hip flexion 4 4-  Hip extension    Hip abduction    Hip adduction    Hip internal rotation    Hip external rotation    Knee flexion 4+ 4+  Knee extension 4+ 4+  Ankle dorsiflexion 5 5  Ankle plantarflexion    Ankle inversion    Ankle eversion     (Blank rows = not tested)   (Blank rows = not tested)  LUMBAR SPECIAL TESTS: Not performed   FUNCTIONAL TESTS:  5 times sit to stand: 13 sec  <12 sec is a risk for falls in the elderly   30 seconds chair stand test: 12 <10 for men ages 36-84  STEADI 4-Stage Balance Test: (inability to maintain tandem stance for 10 sec = increased risk for falls) - feet together, eyes open,  noncompliant surface: 10/10 sec - semi-tandem stance, eyes open, noncompliant surface: 10/10 sec - tandem stances, eyes open, noncompliant surface: 4/5 sec - single leg stance: Not tested   DGI:     GAIT: Distance walked: 40 ft  Assistive device utilized: Single point cane Level of assistance: Modified independence Comments: Wide based gait with decreased step length     TODAY'S TREATMENT  Half tandem with Horizontal head turns 2 x 10  Half tandem with vertical head turns 2 x 10    PATIENT EDUCATION:  Education details: form and technique for appropriate exercise and explanation of  Person educated: Patient Education method: Explanation, Demonstration, Verbal cues, and Handouts Education comprehension: verbalized understanding and returned demonstration   HOME EXERCISE PROGRAM: Access Code: [email or web address] URL: [email or web address] Date: 10/19/2021 Prepared by: Bradly Chris  Exercises - Half Tandem Stance Balance with Head Rotation  - 1 x daily - 7 x weekly - 3 sets - 10 reps  ASSESSMENT:  CLINICAL IMPRESSION: Patient is a 81 y.o. white male who was seen today for physical therapy evaluation and treatment for chronic low back pain and unsteadiness in  the setting of lumbar stenosis with neurogenic claudication. Despite imaging report of severe spinal canal stenosis, pt has no directional preference. He does present at an increased risk for falls with decreased LE strength, albeit he is close to cut-off, and decreased static balance. Unable to complete all functional measures during session due to time. He will benefit from skilled PT to improve static balance, LE strength, and likely dynamic balance to reduce his risk of falling and to improve LE function.    OBJECTIVE IMPAIRMENTS Abnormal gait, decreased balance, decreased mobility, difficulty walking, decreased strength, hypomobility, impaired flexibility, and pain.   ACTIVITY LIMITATIONS carrying, lifting,  bending, sitting, standing, squatting, stairs, and locomotion level  PARTICIPATION LIMITATIONS: cleaning, laundry, shopping, community activity, and yard work  PERSONAL FACTORS Age, Past/current experiences, Time since onset of injury/illness/exacerbation, and 1-2 comorbidities: h/o lumbar surgery and h/o cancer  are also affecting patient's functional outcome.   REHAB POTENTIAL: Fair chronicity of condition and limited response to prior medical interventions   CLINICAL DECISION MAKING: Stable/uncomplicated  EVALUATION COMPLEXITY: Low   GOALS: Goals reviewed with patient? No  SHORT TERM GOALS: Target date: 11/02/2021  Pt will be independent with HEP in order to improve strength and balance in order to decrease fall risk and improve function at home and work. Baseline: NT  Goal status: INITIAL  2.  Patient will be able to perform eccentric portion of sit to stand slowly and under control with little to no compensation as evidence of improve LE strength.  Baseline: Performs sit uncontrolled and quickly Goal status: INITIAL     LONG TERM GOALS: Target date: 12/14/2021  Patient will have improved function and activity level as evidenced by an increase in FOTO score by 10 points or more.  Baseline: 56 Goal status: INITIAL  2.  Patient will perform 5 x STS in <12 sec to show improved LE strength and to decrease risk of falling.  Baseline: 13 sec  Goal status: INITIAL  3.  Patient will be able to perform tandem stance with right and left as back leg as evidence for improved static balance and to decrease risk of falling.  Baseline: 4 sec on RLE and 5 sec on LLE  Goal status: INITIAL  4.  Patient will demonstrate reduced falls risk as evidenced by Dynamic Gait Index (DGI) >19/24. Baseline: NT  Goal status: INITIAL    PLAN: PT FREQUENCY: 1-2x/week  PT DURATION: 8 weeks  PLANNED INTERVENTIONS: Therapeutic exercises, Neuromuscular re-education, Balance training, Gait  training, Patient/Family education, Self Care, Joint mobilization, Joint manipulation, Stair training, Vestibular training, DME instructions, Aquatic Therapy, Dry Needling, Spinal manipulation, Spinal mobilization, Moist heat, Manual therapy, and Re-evaluation.  PLAN FOR NEXT SESSION: DGI, Finish remainder of low back examination and progress hip and abdominal strengthening exercises   Bradly Chris PT, DPT  10/19/2021, 5:07 PM

## 2021-10-21 ENCOUNTER — Ambulatory Visit: Payer: Medicare Other | Admitting: Physical Therapy

## 2021-10-26 ENCOUNTER — Encounter: Payer: Self-pay | Admitting: Physical Therapy

## 2021-10-26 ENCOUNTER — Ambulatory Visit: Payer: Medicare Other | Attending: Neurosurgery | Admitting: Physical Therapy

## 2021-10-26 DIAGNOSIS — M5459 Other low back pain: Secondary | ICD-10-CM | POA: Diagnosis present

## 2021-10-26 DIAGNOSIS — R2681 Unsteadiness on feet: Secondary | ICD-10-CM | POA: Diagnosis present

## 2021-10-26 NOTE — Therapy (Signed)
OUTPATIENT PHYSICAL THERAPY TREATMENT NOTE   Patient Name: Wesley Young MRN: [id number] DOB:05-Mar-1940, 81 y.o., male Today's Date: 10/26/2021  PCP: Dr. Frazier Richards  REFERRING PROVIDER: Dr. Meade Maw   END OF SESSION:   PT End of Session - 10/26/21 0810     Visit Number 2    Number of Visits 16    Date for PT Re-Evaluation 12/14/21    Authorization Type Medicare    Progress Note Due on Visit 10    PT Start Time 0805    PT Stop Time 0845    PT Time Calculation (min) 40 min    Activity Tolerance Patient tolerated treatment well    Behavior During Therapy [facility] for tasks assessed/performed             Past Medical History:  Diagnosis Date   Abnormal PSA    s/p post prostate biopsy in the past which was negative   Actinic keratosis 03/14/2007   L ant lat neck at base of neck - bx proven    Actinic keratosis 01/08/2014   R calf - bx proven    Alcoholism ([place])    with prolonged hospitalization 2009 for withdrawal c/b aspiration pneumonia requiring vent   Anxiety    Atrial fibrillation and flutter ([place])    B12 deficiency    Basal cell carcinoma 03/14/2007   R distal lat tricep near elbow    Basal cell carcinoma 03/06/2014   R calf - excision 04/22/2014   Basal cell carcinoma 08/30/2021   suprasternal area, treated with EDC   Cardiomyopathy ([place])    mild probably multifactorial secondary to HTn and possible alcohol contribution. 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   L upper back paraspinal - excision     Hx of squamous cell carcinoma of skin 2014   multiple sites   Hypertension    Impotence    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[place])    Sleep apnea    Spinal stenosis    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   L forearm - excision    Squamous cell carcinoma of skin 12/16/2015   R dorsum hand    Squamous cell carcinoma of skin 10/02/2017   R dorsum hand    Varicose veins of both lower extremities    Past Surgical History:  Procedure Laterality Date   CATARACT EXTRACTION W/PHACO Left 03/12/2019   Procedure: CATARACT EXTRACTION PHACO AND INTRAOCULAR LENS PLACEMENT ([place]) LEFT 5.27 00:38.6;  Surgeon: Birder Robson, MD;  Location: [place];  Service: Ophthalmology;  Laterality: Left;  sleep apnea   CATARACT EXTRACTION W/PHACO Right 04/09/2019   Procedure: CATARACT EXTRACTION PHACO AND INTRAOCULAR LENS PLACEMENT ([facility]) RIGHT 4.84 00:52.4;  Surgeon: Birder Robson, MD;  Location: [place];  Service: Ophthalmology;  Laterality: Right;   colonoscopy with polypectomy  12/2016   COLONOSCOPY WITH PROPOFOL N/A 12/27/2016   Procedure: COLONOSCOPY WITH  PROPOFOL;  Surgeon: Toledo, Benay Pike, MD;  Location: [facility] ENDOSCOPY;  Service: Endoscopy;  Laterality: N/A;   COLONOSCOPY WITH PROPOFOL N/A 03/23/2020   Procedure: COLONOSCOPY WITH PROPOFOL;  Surgeon: Toledo, Benay Pike, MD;  Location: [facility] ENDOSCOPY;  Service: Gastroenterology;  Laterality: N/A;   ESOPHAGOGASTRODUODENOSCOPY (EGD) WITH PROPOFOL N/A 09/21/2017   Procedure: ESOPHAGOGASTRODUODENOSCOPY (EGD) WITH PROPOFOL;  Surgeon: Lollie Sails, MD;  Location: [facility] END[facility]COPY;  Service: Endoscopy;  Laterality: N/A;   ESOPHAGOGASTRODUODENOSCOPY (EGD) WITH PROPOFOL N/A 03/23/2020   Procedure: ESOPHAGOGASTRODUODENOSCOPY (EGD) WITH PROPOFOL;  Surgeon: Toledo, Benay Pike, MD;  Location: [facility] ENDOSCOPY;  Service: Gastroenterology;  Laterality: N/A;   melonoma removal     prostat biopsy x2     right deltoid resection in 1977     s/p  resection of his right deltoid muscle in [place]     There are no problems to display for this patient.   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  He use to run 10 miles a day when he was running and he things this might have been the cause of his low back pain.  Pt reports past spinal surgery two years ago to relieve pressure in spine because of bowel and bladder issues. He has already received a spinal injection 10 years ago, but this did not help much. He states that he is currently on tramadol which does help with the pain. He went off tramadol for a few months because of irregular blood pressure and he experienced a significant increase in his pain and it did not help blood pressure. Has not fallen   PRECAUTIONS: Falls  SUBJECTIVE: Patient reports being able to do all exercises without difficulty.   PAIN:  Are you having pain? No   OBJECTIVE: (objective measures completed at initial evaluation unless otherwise dated)    VITALS: BP 163/ 68 HR  68 SpO2 100   DIAGNOSTIC FINDINGS:  MRI L spine 10/05/21 1. Severe degenerative findings with severe spinal canal stenosis at L3-L4 and L4-L5, which raises concern for cauda equina syndrome, although this appears unchanged compared to 11/11/2019. In addition there is mild bilateral neural foraminal narrowing at these levels. 2. L1-L2 moderate spinal canal stenosis and mild-to-moderate bilateral neural foraminal narrowing.   PATIENT SURVEYS:  FOTO 56/100 with target of 60    SCREENING FOR RED FLAGS: Bowel or bladder incontinence: No Spinal tumors: No Cauda equina syndrome: No Compression fracture: No Abdominal aneurysm: No   COGNITION:            Overall cognitive status: Within functional limits for tasks assessed                          SENSATION: WFLfeels some numbness and tingling in toes    MUSCLE LENGTH: Hamstrings: NT  Thomas test: NT    POSTURE: No Significant postural limitations   PALPATION: L1-L5 central spinous processes    LUMBAR ROM:    Active  A/PROM  eval  Flexion 100%  Extension 50%  Right lateral flexion 75%  Left lateral flexion 75%  Right rotation 75%  Left rotation 75%   (Blank rows = not tested)   LOWER EXTREMITY ROM:  WFL         LOWER EXTREMITY MMT:       Active  Right eval Left eval  Hip flexion 4 4-  Hip extension      Hip abduction      Hip adduction      Hip internal rotation      Hip external rotation      Knee flexion 4+ 4+  Knee extension 4+ 4+  Ankle dorsiflexion 5 5  Ankle plantarflexion      Ankle inversion      Ankle eversion       (Blank rows = not tested)    (Blank rows = not tested)   LUMBAR SPECIAL TESTS: Not performed    FUNCTIONAL TESTS:  5 times sit to stand: 13 sec  <12 sec is a risk for falls in the elderly   30 seconds chair stand test: 12 <10 for men ages 87-84   STEADI 4-Stage Balance Test: (inability to maintain tandem stance for 10 sec = increased risk for falls) - feet together, eyes open, noncompliant surface: 10/10 sec - semi-tandem stance, eyes open, noncompliant surface: 10/10 sec - tandem stances, eyes open, noncompliant surface: 4/5 sec - single leg stance: Not tested    DGI: 18/24       GAIT: Distance walked: 40 ft  Assistive device utilized: Single point cane Level of assistance: Modified independence Comments: Wide based gait with decreased step length        TODAY'S TREATMENT   10/26/21            Nu-Step with resistance level 4 seat and arms 11- 5 min             DGI: 18/24             -Mild Impairment: Change in gait speed, gait with horizontal and vertical head turns, gait and pivot turn, step over  obstacles, and stairs             Sit to Stand 1 x 8             Sit to Stand 2 x 8 with 8 lb water jug              -min VC to decrease speed of eccentric portion             Seated HS Stretch 2 x 30 sec              Hip MMT               Adduction R/L 4-/4-              Abduction R/L 3+/3+              Ext R/L 3+/3+                      SLR: Negative bilateral, restrictions experienced at 70 degrees hip flexion              Ely's Test: + Bilateral            Ober's Test: - Bilateral              Initial  Half tandem with Horizontal head turns 2 x 10  Half tandem with vertical head turns 2 x 10      PATIENT EDUCATION:  Education details: form and technique for appropriate exercise and explanation of  Person educated: Patient Education method: Explanation,  Demonstration, Verbal cues, and Handouts Education comprehension: verbalized understanding and returned demonstration     HOME EXERCISE PROGRAM: Access Code: [email or web address] URL: [email or web address] Date: 10/26/2021 Prepared by: Bradly Chris  Exercises - Half Tandem Stance Balance with Head Rotation  - 1 x daily - 7 x weekly - 3 sets - 10 reps - Sit to Stand  - 3 x weekly - 3 sets - 8 reps - Seated Table Hamstring Stretch  - 1 x daily - 3 reps - 30 hold - Walking with Head Rotation  - 1 x daily - 10 reps   ASSESSMENT:   CLINICAL IMPRESSION:  Pt exhibits decreased hip strength and flexibility and decreased dynamic balance in the setting of neurogenic claudication from lumbar stenosis that places him at risked for increased for falls. He exhibits an improvement in LE strength with ability to perform sit to stand with increased resistance. He will benefit from skilled PT to improve static balance, LE strength, and likely dynamic balance to reduce his risk of falling and to improve LE function.    OBJECTIVE IMPAIRMENTS Abnormal gait, decreased balance, decreased mobility, difficulty walking, decreased strength,  hypomobility, impaired flexibility, and pain.    ACTIVITY LIMITATIONS carrying, lifting, bending, sitting, standing, squatting, stairs, and locomotion level   PARTICIPATION LIMITATIONS: cleaning, laundry, shopping, community activity, and yard work   PERSONAL FACTORS Age, Past/current experiences, Time since onset of injury/illness/exacerbation, and 1-2 comorbidities: h/o lumbar surgery and h/o cancer  are also affecting patient's functional outcome.    REHAB POTENTIAL: Fair chronicity of condition and limited response to prior medical interventions    CLINICAL DECISION MAKING: Stable/uncomplicated   EVALUATION COMPLEXITY: Low     GOALS: Goals reviewed with patient? No   SHORT TERM GOALS: Target date: 11/02/2021   Pt will be independent with HEP in order to improve strength and balance in order to decrease fall risk and improve function at home and work. Baseline: NT  Goal status: Ongoing    2.  Patient will be able to perform eccentric portion of sit to stand slowly and under control with little to no compensation as evidence of improve LE strength.  Baseline: Performs sit uncontrolled and quickly Goal status: Ongoing          LONG TERM GOALS: Target date: 12/14/2021   Patient will have improved function and activity level as evidenced by an increase in FOTO score by 10 points or more.  Baseline: 56 Goal status: Ongoing    2.  Patient will perform 5 x STS in <12 sec to show improved LE strength and to decrease risk of falling.  Baseline: 13 sec  Goal status: Ongoing    3.  Patient will be able to perform tandem stance with right and left as back leg as evidence for improved static balance and to decrease risk of falling.  Baseline: 4 sec on RLE and 5 sec on LLE  Goal status: Ongoing    4.  Patient will demonstrate reduced falls risk as evidenced by Dynamic Gait Index (DGI) >19/24. Baseline: NT  Goal status: Ongoing        PLAN: PT FREQUENCY: 1-2x/week   PT  DURATION: 8 weeks   PLANNED INTERVENTIONS: Therapeutic exercises, Neuromuscular re-education, Balance training, Gait training, Patient/Family education, Self Care, Joint mobilization, Joint manipulation, Stair training, Vestibular training, DME instructions, Aquatic Therapy, Dry Needling, Spinal manipulation, Spinal mobilization, Moist heat, Manual therapy, and Re-evaluation.   PLAN FOR NEXT SESSION:  Prone quad stretch, progress abdominal and hip strength  as well as static and dynamic balance  Bradly Chris PT, DPT  10/26/2021, 8:11 AM

## 2021-10-28 ENCOUNTER — Encounter: Payer: Medicare Other | Admitting: Physical Therapy

## 2021-11-03 ENCOUNTER — Encounter: Payer: Medicare Other | Admitting: Physical Therapy

## 2021-11-03 ENCOUNTER — Ambulatory Visit: Payer: Medicare Other | Admitting: Physical Therapy

## 2021-11-03 ENCOUNTER — Encounter: Payer: Self-pay | Admitting: Physical Therapy

## 2021-11-03 DIAGNOSIS — M5459 Other low back pain: Secondary | ICD-10-CM

## 2021-11-03 DIAGNOSIS — R2681 Unsteadiness on feet: Secondary | ICD-10-CM

## 2021-11-03 NOTE — Therapy (Signed)
OUTPATIENT PHYSICAL THERAPY TREATMENT NOTE   Patient Name: Wesley Young MRN: [id number] DOB:19-Oct-1940, 81 y.o., male Today's Date: 11/03/2021  PCP: Dr. Frazier Richards  REFERRING PROVIDER: Dr. Meade Maw   END OF SESSION:   PT End of Session - 11/03/21 0856     Visit Number 3    Number of Visits 16    Date for PT Re-Evaluation 12/14/21    Authorization Type Medicare    Progress Note Due on Visit 10    PT Start Time 0850    PT Stop Time 0930    PT Time Calculation (min) 40 min    Equipment Utilized During Treatment Gait belt    Activity Tolerance Patient tolerated treatment well    Behavior During Therapy WFL for tasks assessed/performed             Past Medical History:  Diagnosis Date   Abnormal PSA    s/p post prostate biopsy in the past which was negative   Actinic keratosis 03/14/2007   L ant lat neck at base of neck - bx proven    Actinic keratosis 01/08/2014   R calf - bx proven    Alcoholism ([place])    with prolonged hospitalization 2009 for withdrawal c/b aspiration pneumonia requiring vent   Anxiety    Atrial fibrillation and flutter ([place])    B12 deficiency    Basal cell carcinoma 03/14/2007   R distal lat tricep near elbow    Basal cell carcinoma 03/06/2014   R calf - excision 04/22/2014   Basal cell carcinoma 08/30/2021   suprasternal area, treated with EDC   Cardiomyopathy ([place])    mild probably multifactorial secondary to HTn and possible alcohol contribution. 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   L upper back paraspinal - excision    Hx of squamous cell carcinoma of skin 2014   multiple sites   Hypertension    Impotence    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[place])    Sleep apnea    Spinal stenosis    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   L forearm - excision    Squamous cell carcinoma of skin 12/16/2015   R dorsum hand    Squamous cell carcinoma of skin 10/02/2017   R dorsum hand    Varicose veins of both lower extremities    Past Surgical History:  Procedure Laterality Date   CATARACT EXTRACTION W/PHACO Left 03/12/2019   Procedure: CATARACT EXTRACTION PHACO AND INTRAOCULAR LENS PLACEMENT ([place]) LEFT 5.27 00:38.6;  Surgeon: Birder Robson, MD;  Location: [place];  Service: Ophthalmology;  Laterality: Left;  sleep apnea   CATARACT EXTRACTION W/PHACO Right 04/09/2019   Procedure: CATARACT EXTRACTION PHACO AND INTRAOCULAR LENS PLACEMENT ([facility]) RIGHT 4.84 00:52.4;  Surgeon: Birder Robson, MD;  Location: [place];  Service: Ophthalmology;  Laterality: Right;   colonoscopy with polypectomy  12/2016   COLONOSCOPY  WITH PROPOFOL N/A 12/27/2016   Procedure: COLONOSCOPY WITH PROPOFOL;  Surgeon: Toledo, Benay Pike, MD;  Location: [facility] ENDOSCOPY;  Service: Endoscopy;  Laterality: N/A;   COLONOSCOPY WITH PROPOFOL N/A 03/23/2020   Procedure: COLONOSCOPY WITH PROPOFOL;  Surgeon: Toledo, Benay Pike, MD;  Location: [facility] ENDOSCOPY;  Service: Gastroenterology;  Laterality: N/A;   ESOPHAGOGASTRODUODENOSCOPY (EGD) WITH PROPOFOL N/A 09/21/2017   Procedure: ESOPHAGOGASTRODUODENOSCOPY (EGD) WITH PROPOFOL;  Surgeon: Lollie Sails, MD;  [facility];  Service: Endoscopy;  Laterality: N/A;   ESOPHAGOGASTRODUODENOSCOPY (EGD) WITH PROPOFOL N/A 03/23/2020   Procedure: ESOPHAGOGASTRODUODENOSCOPY (EGD) WITH PROPOFOL;  Surgeon: Toledo, Benay Pike, MD;  Location: [facility] ENDOSCOPY;  Service: Gastroenterology;  Laterality: N/A;   melonoma removal     prostat biopsy  x2     right deltoid resection in 1977     s/p resection of his right deltoid muscle in [place]     There are no problems to display for this patient.   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  He use to run 10 miles a day when he was running and he things this might have been the cause of his low back pain.  Pt reports past spinal surgery two years ago to relieve pressure in spine because of bowel and bladder issues. He has already received a spinal injection 10 years ago, but this did not help much. He states that he is currently on tramadol which does help with the pain. He went off tramadol for a few months because of irregular blood pressure and he experienced a significant increase in his pain and it did not help blood pressure. Has not fallen   PRECAUTIONS: Falls  SUBJECTIVE: Pt reports all exercises are going well with the exception of the balance exercise which he is having difficulty with.   PAIN:  Are you having pain? No   OBJECTIVE: (objective measures completed at initial evaluation unless otherwise dated)    VITALS: BP 163/ 68 HR  68 SpO2 100   DIAGNOSTIC FINDINGS:  MRI L spine 10/05/21 1. Severe degenerative findings with severe spinal canal stenosis at L3-L4 and L4-L5, which raises concern for cauda equina syndrome, although this appears unchanged compared to 11/11/2019. In addition there is mild bilateral neural foraminal narrowing at these levels. 2. L1-L2 moderate spinal canal stenosis and mild-to-moderate bilateral neural foraminal narrowing.   PATIENT SURVEYS:  FOTO 56/100 with target of 60    SCREENING FOR RED FLAGS: Bowel or bladder incontinence: No Spinal tumors:  No Cauda equina syndrome: No Compression fracture: No Abdominal aneurysm: No   COGNITION:           Overall cognitive status: Within functional limits for tasks assessed                          SENSATION: WFLfeels some numbness and tingling in toes    MUSCLE LENGTH: Hamstrings: NT  Thomas test: NT    POSTURE: No Significant postural limitations   PALPATION: L1-L5 central spinous processes    LUMBAR ROM:    Active  A/PROM  eval  Flexion 100%  Extension 50%  Right lateral flexion 75%  Left lateral flexion 75%  Right rotation 75%  Left rotation 75%   (Blank rows = not tested)   LOWER EXTREMITY ROM:   WFL         LOWER EXTREMITY MMT:       Active  Right eval Left eval  Hip flexion 4 4-  Hip extension      Hip abduction      Hip adduction      Hip internal rotation      Hip external rotation      Knee flexion 4+ 4+  Knee extension 4+ 4+  Ankle dorsiflexion 5 5  Ankle plantarflexion      Ankle inversion      Ankle eversion       (Blank rows = not tested)    (Blank rows = not tested)   LUMBAR SPECIAL TESTS: Not performed    FUNCTIONAL TESTS:  5 times sit to stand: 13 sec  <12 sec is a risk for falls in the elderly   30 seconds chair stand test: 12 <10 for men ages 86-84   STEADI 4-Stage Balance Test: (inability to maintain tandem stance for 10 sec = increased risk for falls) - feet together, eyes open, noncompliant surface: 10/10 sec - semi-tandem stance, eyes open, noncompliant surface: 10/10 sec - tandem stances, eyes open, noncompliant surface: 4/5 sec - single leg stance: Not tested    DGI: 18/24       GAIT: Distance walked: 40 ft  Assistive device utilized: Single point cane Level of assistance: Modified independence Comments: Wide based gait with decreased step length        TODAY'S TREATMENT   11/03/21           Nu-Step with resistance level 4 seat and arms 11- 5 min            Prone Quad Stretch 3 x 30 sec            -Only  able to perform using LUE because had h/o cancer in right shoulder           Sit to Stand #8 DB x 2 3 x 10   NMR           Half Tandem R + L Horizontal Head Turns 3 x 10           Half Tandem R + L Vertical Head Turns 3 x 10              10/26/21            Nu-Step with resistance level 4 seat and arms 11- 5 min             DGI: 18/24             -Mild Impairment: Change in gait speed, gait with horizontal and vertical head turns, gait and pivot turn, step over obstacles, and stairs             Sit to Stand 1 x 8             Sit to Stand 2 x 8 with 8 lb water jug              -min VC to decrease speed of eccentric portion             Seated HS Stretch 2 x 30 sec              Hip MMT  Adduction R/L 4-/4-              Abduction R/L 3+/3+              Ext R/L 3+/3+                      SLR: Negative bilateral, restrictions experienced at 70 degrees hip flexion              Ely's Test: + Bilateral            Ober's Test: - Bilateral              Initial  Half tandem with Horizontal head turns 2 x 10  Half tandem with vertical head turns 2 x 10      PATIENT EDUCATION:  Education details: form and technique for appropriate exercise and explanation of what ti anticipate with balance exercises.  Person educated: Patient Education method: Explanation, Demonstration, Verbal cues, and Handouts Education comprehension: verbalized understanding and returned demonstration     HOME EXERCISE PROGRAM: Access Code: [email or web address] URL: [email or web address] Date: 11/03/2021 Prepared by: Bradly Chris  Exercises - Half Tandem Stance Balance with Head Rotation  - 1 x daily - 7 x weekly - 3 sets - 10 reps - Sit to Stand  - 3 x weekly - 3 sets - 10 reps - Seated Table Hamstring Stretch  - 1 x daily - 3 reps - 30 hold - Walking with Head Rotation  - 1 x daily - 10 reps - Prone Quadriceps Stretch with Strap  - 1 x daily - 7 x weekly - 3 reps - 30 hold   ASSESSMENT:    CLINICAL IMPRESSION:  Pt exhibits decreased hip strength and flexibility and decreased dynamic balance in the setting of neurogenic claudication from lumbar stenosis that places him at risked for increased for falls. He exhibits an improvement in LE strength with ability to perform sit to stand with increased resistance. He will benefit from skilled PT to improve static balance, LE strength, and likely dynamic balance to reduce his risk of falling and to improve LE function.    OBJECTIVE IMPAIRMENTS Abnormal gait, decreased balance, decreased mobility, difficulty walking, decreased strength, hypomobility, impaired flexibility, and pain.    ACTIVITY LIMITATIONS carrying, lifting, bending, sitting, standing, squatting, stairs, and locomotion level   PARTICIPATION LIMITATIONS: cleaning, laundry, shopping, community activity, and yard work   PERSONAL FACTORS Age, Past/current experiences, Time since onset of injury/illness/exacerbation, and 1-2 comorbidities: h/o lumbar surgery and h/o cancer  are also affecting patient's functional outcome.    REHAB POTENTIAL: Fair chronicity of condition and limited response to prior medical interventions    CLINICAL DECISION MAKING: Stable/uncomplicated   EVALUATION COMPLEXITY: Low     GOALS: Goals reviewed with patient? No   SHORT TERM GOALS: Target date: 11/02/2021   Pt will be independent with HEP in order to improve strength and balance in order to decrease fall risk and improve function at home and work. Baseline: NT  Goal status: Ongoing    2.  Patient will be able to perform eccentric portion of sit to stand slowly and under control with little to no compensation as evidence of improve LE strength.  Baseline: Performs sit uncontrolled and quickly Goal status: Ongoing          LONG TERM GOALS: Target date: 12/14/2021   Patient will have improved function and activity level as evidenced by an increase in FOTO score by 10 points or  more.   Baseline: 56 Goal status: Ongoing    2.  Patient will perform 5 x STS in <12 sec to show improved LE strength and to decrease risk of falling.  Baseline: 13 sec  Goal status: Ongoing    3.  Patient will be able to perform tandem stance with right and left as back leg as evidence for improved static balance and to decrease risk of falling.  Baseline: 4 sec on RLE and 5 sec on LLE  Goal status: Ongoing    4.  Patient will demonstrate reduced falls risk as evidenced by Dynamic Gait Index (DGI) >19/24. Baseline: NT  Goal status: Ongoing        PLAN: PT FREQUENCY: 1-2x/week   PT DURATION: 8 weeks   PLANNED INTERVENTIONS: Therapeutic exercises, Neuromuscular re-education, Balance training, Gait training, Patient/Family education, Self Care, Joint mobilization, Joint manipulation, Stair training, Vestibular training, DME instructions, Aquatic Therapy, Dry Needling, Spinal manipulation, Spinal mobilization, Moist heat, Manual therapy, and Re-evaluation.   PLAN FOR NEXT SESSION:   Progress abdominal and hip strength as well as static and dynamic balance  Bradly Chris PT, DPT  11/03/2021, 8:57 AM

## 2021-11-08 ENCOUNTER — Ambulatory Visit: Payer: Medicare Other | Admitting: Physical Therapy

## 2021-11-09 ENCOUNTER — Encounter: Payer: Medicare Other | Admitting: Physical Therapy

## 2021-11-10 ENCOUNTER — Encounter: Payer: Medicare Other | Admitting: Physical Therapy

## 2021-11-16 ENCOUNTER — Ambulatory Visit: Payer: Medicare Other | Admitting: Physical Therapy

## 2021-11-16 ENCOUNTER — Encounter: Payer: Self-pay | Admitting: Physical Therapy

## 2021-11-16 DIAGNOSIS — M5459 Other low back pain: Secondary | ICD-10-CM

## 2021-11-16 DIAGNOSIS — R2681 Unsteadiness on feet: Secondary | ICD-10-CM | POA: Diagnosis not present

## 2021-11-16 NOTE — Therapy (Signed)
OUTPATIENT PHYSICAL THERAPY TREATMENT NOTE   Patient Name: Wesley Young MRN: [id number] DOB:[birth year], 81 y.o., male Today's Date: 11/16/2021  PCP: Dr. Frazier Richards  REFERRING PROVIDER: Dr. Meade Maw   END OF SESSION:   PT End of Session - 11/16/21 1716     Visit Number 4    Number of Visits 16    Date for PT Re-Evaluation 12/14/21    Authorization Type Medicare    Progress Note Due on Visit 10    PT Start Time 1415    PT Stop Time 1500    PT Time Calculation (min) 45 min    Equipment Utilized During Treatment Gait belt    Activity Tolerance Patient tolerated treatment well    Behavior During Therapy WFL for tasks assessed/performed              Past Medical History:  Diagnosis Date   Abnormal PSA    s/p post prostate biopsy in the past which was negative   Actinic keratosis 03/14/2007   L ant lat neck at base of neck - bx proven    Actinic keratosis 01/08/2014   R calf - bx proven    Alcoholism ([place])    with prolonged hospitalization 2009 for withdrawal c/b aspiration pneumonia requiring vent   Anxiety    Atrial fibrillation and flutter ([place])    B12 deficiency    Basal cell carcinoma 03/14/2007   R distal lat tricep near elbow    Basal cell carcinoma 03/06/2014   R calf - excision 04/22/2014   Basal cell carcinoma 08/30/2021   suprasternal area, treated with EDC   Cardiomyopathy ([place])    mild probably multifactorial secondary to HTn and possible alcohol contribution. 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   L upper back paraspinal - excision    Hx of squamous cell carcinoma of skin 2014   multiple sites   Hypertension    Impotence    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[place])    Sleep apnea    Spinal stenosis    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   L forearm - excision    Squamous cell carcinoma of skin 12/16/2015   R dorsum hand    Squamous cell carcinoma of skin 10/02/2017   R dorsum hand    Varicose veins of both lower extremities    Past Surgical History:  Procedure Laterality Date   CATARACT EXTRACTION W/PHACO Left 03/12/2019   Procedure: CATARACT EXTRACTION PHACO AND INTRAOCULAR LENS PLACEMENT ([place]) LEFT 5.27 00:38.6;  Surgeon: Birder Robson, MD;  Location: [place];  Service: Ophthalmology;  Laterality: Left;  sleep apnea   CATARACT EXTRACTION W/PHACO Right 04/09/2019   Procedure: CATARACT EXTRACTION PHACO AND INTRAOCULAR LENS PLACEMENT ([facility]) RIGHT 4.84 00:52.4;  Surgeon: Birder Robson, MD;  Location: [place];  Service: Ophthalmology;  Laterality: Right;   colonoscopy with polypectomy  12/2016  COLONOSCOPY WITH PROPOFOL N/A 12/27/2016   Procedure: COLONOSCOPY WITH PROPOFOL;  Surgeon: Toledo, Benay Pike, MD;  Location: [facility] ENDOSCOPY;  Service: Endoscopy;  Laterality: N/A;   COLONOSCOPY WITH PROPOFOL N/A 03/23/2020   Procedure: COLONOSCOPY WITH PROPOFOL;  Surgeon: Toledo, Benay Pike, MD;  Location: [facility] ENDOSCOPY;  Service: Gastroenterology;  Laterality: N/A;   ESOPHAGOGASTRODUODENOSCOPY (EGD) WITH PROPOFOL N/A 09/21/2017   Procedure: ESOPHAGOGASTRODUODENOSCOPY (EGD) WITH PROPOFOL;  Surgeon: Lollie Sails, MD;  Location: [facility] ENDOSCOPY;  Service: Endoscopy;  Laterality: N/A;   ESOPHAGOGASTRODUODENOSCOPY (EGD) WITH PROPOFOL N/A 03/23/2020   Procedure: ESOPHAGOGASTRODUODENOSCOPY (EGD) WITH PROPOFOL;  Surgeon: Toledo, Benay Pike, MD;  Location: [facility] ENDOSCOPY;  Service: Gastroenterology;  Laterality: N/A;   melonoma removal     prostat biopsy  x2     right deltoid resection in 1977     s/p resection of his right deltoid muscle in [place]     There are no problems to display for this patient.   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  He use to run 10 miles a day when he was running and he things this might have been the cause of his low back pain.  Pt reports past spinal surgery two years ago to relieve pressure in spine because of bowel and bladder issues. He has already received a spinal injection 10 years ago, but this did not help much. He states that he is currently on tramadol which does help with the pain. He went off tramadol for a few months because of irregular blood pressure and he experienced a significant increase in his pain and it did not help blood pressure. Has not fallen   PRECAUTIONS: Falls  SUBJECTIVE: Pt reports taking a short break from exercises since last session because he had a wedding to attend. He describes experiencing a little bit of weakness associated with bowel and bladder symptoms. His physicians are aware of the cauda equina.  PAIN:  Are you having pain? No   OBJECTIVE: (objective measures completed at initial evaluation unless otherwise dated)    VITALS: BP 163/ 68 HR  68 SpO2 100   DIAGNOSTIC FINDINGS:  MRI L spine 10/05/21 1. Severe degenerative findings with severe spinal canal stenosis at L3-L4 and L4-L5, which raises concern for cauda equina syndrome, although this appears unchanged compared to 11/11/2019. In addition there is mild bilateral neural foraminal narrowing at these levels. 2. L1-L2 moderate spinal canal stenosis and mild-to-moderate bilateral neural foraminal narrowing.   PATIENT  SURVEYS:  FOTO 56/100 with target of 60    SCREENING FOR RED FLAGS: Bowel or bladder incontinence: No Spinal tumors: No Cauda equina syndrome: No Compression fracture: No Abdominal aneurysm: No   COGNITION:           Overall cognitive status: Within functional limits for tasks assessed                          SENSATION: WFLfeels some numbness and tingling in toes    MUSCLE LENGTH: Hamstrings: NT  Thomas test: NT    POSTURE: No Significant postural limitations   PALPATION: L1-L5 central spinous processes    LUMBAR ROM:    Active  A/PROM  eval  Flexion 100%  Extension 50%  Right lateral flexion 75%  Left lateral flexion 75%  Right rotation 75%  Left rotation 75%   (Blank rows = not tested)   LOWER EXTREMITY ROM:   WFL         LOWER EXTREMITY MMT:       Active  Right eval Left eval  Hip flexion 4 4-  Hip extension      Hip abduction      Hip adduction      Hip internal rotation      Hip external rotation      Knee flexion 4+ 4+  Knee extension 4+ 4+  Ankle dorsiflexion 5 5  Ankle plantarflexion      Ankle inversion      Ankle eversion       (Blank rows = not tested)    (Blank rows = not tested)   LUMBAR SPECIAL TESTS: Not performed    FUNCTIONAL TESTS:  5 times sit to stand: 13 sec  <12 sec is a risk for falls in the elderly   30 seconds chair stand test: 12 <10 for men ages 6-84   STEADI 4-Stage Balance Test: (inability to maintain tandem stance for 10 sec = increased risk for falls) - feet together, eyes open, noncompliant surface: 10/10 sec - semi-tandem stance, eyes open, noncompliant surface: 10/10 sec - tandem stances, eyes open, noncompliant surface: 4/5 sec - single leg stance: Not tested    DGI: 18/24 17/24 (11/16/21) -Minor Impairments: Head turns, turn and stop, obstacles, stairs, walking straight, fast and slow         GAIT: Distance walked: 40 ft  Assistive device utilized: Single point cane Level of assistance:  Modified independence Comments: Wide based gait with decreased step length        TODAY'S TREATMENT   11/16/21          Nu-Step with resistance level 4 seat and arms 11- 5 min           DGI: 17/24          -Minor Impairments: Gait level surface, change in gait speed, gait with head turns          Sit to Stand from 18 inch height with #8 x 2 DB 1 x 6           -mod VC to stop swinging DB forward          Sit to Stand from 18 inch height with #8 x 1 DB 1 x 10          Sit to Stand from 18 inch height 1 x 10          - min VC to keep back of knees away from mat.         NMR        Figure 8s with 10 ft spacing for cones x 10        Half Tandem 4 x 10 sec        -moderate sway with intermittent use of hands        Half Tandem with horizontal head turns 2 x 10        -severe sway with stepping to regain balance and use of hands        Half Tandem with vertical head turns 2 x 10         -severe sway with stepping to regain balance and use of  hands   11/03/21           Nu-Step with resistance level 4 seat and arms 11- 5 min            Prone Quad Stretch 3 x 30 sec            -Only able to perform using LUE because had h/o cancer in right shoulder           Sit to Stand #8 DB x 2 3 x 10   NMR           Half Tandem R + L Horizontal Head Turns 3 x 10           Half Tandem R + L Vertical Head Turns 3 x 10              10/26/21            Nu-Step with resistance level 4 seat and arms 11- 5 min             DGI: 18/24             -Mild Impairment: Change in gait speed, gait with horizontal and vertical head turns, gait and pivot turn, step over obstacles, and stairs             Sit to Stand 1 x 8             Sit to Stand 2 x 8 with 8 lb water jug              -min VC to decrease speed of eccentric portion             Seated HS Stretch 2 x 30 sec              Hip MMT               Adduction R/L 4-/4-              Abduction R/L 3+/3+              Ext R/L 3+/3+                       SLR: Negative bilateral, restrictions experienced at 70 degrees hip flexion              Ely's Test: + Bilateral            Ober's Test: - Bilateral              Initial  Half tandem with Horizontal head turns 2 x 10  Half tandem with vertical head turns 2 x 10      PATIENT EDUCATION:  Education details: form and technique for appropriate exercise and explanation of what ti anticipate with balance exercises.  Person educated: Patient Education method: Explanation, Demonstration, Verbal cues, and Handouts Education comprehension: verbalized understanding and returned demonstration     HOME EXERCISE PROGRAM: Access Code: [email or web address] URL: [email or web address] Date: 11/16/2021 Prepared by: Bradly Chris  Exercises - Half Tandem Stance Balance with Head Rotation  - 1 x daily - 7 x weekly - 3 sets - 10 reps - Sit to Stand  - 3 x weekly - 3 sets - 10 reps - Seated Table Hamstring Stretch  - 1 x daily - 3 reps - 30 hold - Walking with Head Rotation  - 1 x daily - 10 reps - Prone Quadriceps Stretch with Strap  - 1 x  daily - 7 x weekly - 3 reps - 30 hold - Figure-8 Walking Around Cones  - 1 x daily - 10 reps   ASSESSMENT:   CLINICAL IMPRESSION:  Pt shows limited improvement with static and dynamic balance since last session and a decrease in self-perception of his function. This is likely due to pt's decreased participation in HEP because of wedding.  LE strength decreased but this is likely because he is no longer compensating with increased use of mat for leverage. He displays difficulty with translating weight forward over toes resulting in LOB posteriorly. He will continue to benefit from skilled PT to improve static balance, LE strength, and likely dynamic balance to reduce his risk of falling and to improve LE function.    OBJECTIVE IMPAIRMENTS Abnormal gait, decreased balance, decreased mobility, difficulty walking, decreased strength, hypomobility, impaired  flexibility, and pain.    ACTIVITY LIMITATIONS carrying, lifting, bending, sitting, standing, squatting, stairs, and locomotion level   PARTICIPATION LIMITATIONS: cleaning, laundry, shopping, community activity, and yard work   PERSONAL FACTORS Age, Past/current experiences, Time since onset of injury/illness/exacerbation, and 1-2 comorbidities: h/o lumbar surgery and h/o cancer  are also affecting patient's functional outcome.    REHAB POTENTIAL: Fair chronicity of condition and limited response to prior medical interventions    CLINICAL DECISION MAKING: Stable/uncomplicated   EVALUATION COMPLEXITY: Low     GOALS: Goals reviewed with patient? No   SHORT TERM GOALS: Target date: 11/02/2021   Pt will be independent with HEP in order to improve strength and balance in order to decrease fall risk and improve function at home and work. Baseline: NT  Goal status: Ongoing    2.  Patient will be able to perform eccentric portion of sit to stand slowly and under control with little to no compensation as evidence of improve LE strength.  Baseline: Performs sit uncontrolled and quickly Goal status: Ongoing          LONG TERM GOALS: Target date: 12/14/2021   Patient will have improved function and activity level as evidenced by an increase in FOTO score by 10 points or more.  Baseline: 56  11/16/21 52 with target of 60 Goal status: Ongoing   2.  Patient will perform 5 x STS in <12 sec to show improved LE strength and to decrease risk of falling.  Baseline: 13 sec  Goal status: Ongoing    3.  Patient will be able to perform tandem stance with right and left as back leg as evidence for improved static balance and to decrease risk of falling.  Baseline: 4 sec on RLE and 5 sec on LLE  Goal status: Ongoing    4.  Patient will demonstrate reduced falls risk as evidenced by Dynamic Gait Index (DGI) >19/24. Baseline: NT  Goal status: Ongoing        PLAN: PT FREQUENCY: 1-2x/week   PT  DURATION: 8 weeks   PLANNED INTERVENTIONS: Therapeutic exercises, Neuromuscular re-education, Balance training, Gait training, Patient/Family education, Self Care, Joint mobilization, Joint manipulation, Stair training, Vestibular training, DME instructions, Aquatic Therapy, Dry Needling, Spinal manipulation, Spinal mobilization, Moist heat, Manual therapy, and Re-evaluation.   PLAN FOR NEXT SESSION:   Progress abdominal and hip strength as well as static and dynamic balance  Bradly Chris PT, DPT  11/16/2021, 5:17 PM

## 2021-11-18 ENCOUNTER — Other Ambulatory Visit: Payer: Self-pay

## 2021-11-18 ENCOUNTER — Emergency Department: Payer: Medicare Other

## 2021-11-18 ENCOUNTER — Ambulatory Visit: Payer: Medicare Other | Admitting: Urology

## 2021-11-18 ENCOUNTER — Emergency Department
Admission: EM | Admit: 2021-11-18 | Discharge: 2021-11-18 | Disposition: A | Discharge status: Home / Self Care | Payer: Medicare Other | Attending: Emergency Medicine | Admitting: Emergency Medicine

## 2021-11-18 ENCOUNTER — Ambulatory Visit: Payer: Medicare Other | Admitting: Physical Therapy

## 2021-11-18 DIAGNOSIS — M48061 Spinal stenosis, lumbar region without neurogenic claudication: Secondary | ICD-10-CM | POA: Diagnosis not present

## 2021-11-18 DIAGNOSIS — M549 Dorsalgia, unspecified: Secondary | ICD-10-CM | POA: Diagnosis present

## 2021-11-18 DIAGNOSIS — R2681 Unsteadiness on feet: Secondary | ICD-10-CM

## 2021-11-18 DIAGNOSIS — M545 Low back pain, unspecified: Secondary | ICD-10-CM

## 2021-11-18 DIAGNOSIS — M5459 Other low back pain: Secondary | ICD-10-CM

## 2021-11-18 LAB — COMPREHENSIVE METABOLIC PANEL
ALT: 18 U/L (ref 0–44)
AST: 16 U/L (ref 15–41)
Albumin: 4.1 g/dL (ref 3.5–5.0)
Alkaline Phosphatase: 115 U/L (ref 38–126)
Anion gap: 7 (ref 5–15)
BUN: 22 mg/dL (ref 8–23)
CO2: 27 mmol/L (ref 22–32)
Calcium: 9.9 mg/dL (ref 8.9–10.3)
Chloride: 104 mmol/L (ref 98–111)
Creatinine, Ser: 0.75 mg/dL (ref 0.61–1.24)
GFR, Estimated: >60 mL/min (ref >60)
Glucose, Bld: 110 mg/dL — ABNORMAL HIGH (ref 70–99)
Potassium: 4.4 mmol/L (ref 3.5–5.1)
Sodium: 138 mmol/L (ref 135–145)
Total Bilirubin: 0.7 mg/dL (ref 0.3–1.2)
Total Protein: 7.1 g/dL (ref 6.5–8.1)

## 2021-11-18 LAB — CBC WITH DIFFERENTIAL/PLATELET
Abs Immature Granulocytes: 0.02 10*3/uL (ref 0.00–0.07)
Basophils Absolute: 0 10*3/uL (ref 0.0–0.1)
Basophils Relative: 1 %
Eosinophils Absolute: 0.2 10*3/uL (ref 0.0–0.5)
Eosinophils Relative: 2 %
HCT: 41.4 % (ref 39.0–52.0)
Hemoglobin: 13.6 g/dL (ref 13.0–17.0)
Immature Granulocytes: 0 %
Lymphocytes Relative: 10 %
Lymphs Abs: 0.8 10*3/uL (ref 0.7–4.0)
MCH: 31.3 pg (ref 26.0–34.0)
MCHC: 32.9 g/dL (ref 30.0–36.0)
MCV: 95.2 fL (ref 80.0–100.0)
Monocytes Absolute: 0.6 10*3/uL (ref 0.1–1.0)
Monocytes Relative: 8 %
Neutro Abs: 6.1 10*3/uL (ref 1.7–7.7)
Neutrophils Relative %: 79 %
Platelets: 274 10*3/uL (ref 150–400)
RBC: 4.35 MIL/uL (ref 4.22–5.81)
RDW: 12.4 % (ref 11.5–15.5)
WBC: 7.7 10*3/uL (ref 4.0–10.5)
nRBC: 0 % (ref 0.0–0.2)

## 2021-11-18 NOTE — ED Provider Notes (Signed)
[facility] Provider Note    Event Date/Time   First MD Initiated Contact with Patient 11/18/21 1015     (approximate)   History   Diarrhea   HPI  Wesley Young is a 81 y.o. male   presents to the ED with some concerns as he was told in the past after he had back surgery that he should go to the emergency room immediately should he have any incontinence of bowel or bladder.  Patient states that while he was sleeping he had some stool that leaked.  He denies any blood in his stool and also denies any incontinence of bladder.  Patient has history of chronic back pain and currently is going to physical therapy.  He did have a procedure and saw Dr. Izora Ribas.  He has an appointment with Dr. Izora Ribas in 1 month.  He denies any decreased sensations or sciatica to either of his lower extremities.  Patient continues to walk with his walker.      Physical Exam   Triage Vital Signs: ED Triage Vitals  Enc Vitals Group     BP 11/18/21 0850 (!) 155/69     Pulse Rate 11/18/21 0850 84     Resp 11/18/21 0850 18     Temp 11/18/21 0850 98.8 F (37.1 C)     Temp Source 11/18/21 0850 Oral     SpO2 11/18/21 0850 97 %     Weight 11/18/21 0851 170 lb (77.1 kg)     Height 11/18/21 0851 6' (1.829 m)     Head Circumference --      Peak Flow --      Pain Score 11/18/21 0851 3     Pain Loc --      Pain Edu? --      Excl. in [place]? --     Most recent vital signs: Vitals:   11/18/21 1214 11/18/21 1415  BP: (!) 150/60 (!) 148/67  Pulse: 80 78  Resp: 18 18  Temp:  98 F (36.7 C)  SpO2: 98% 98%     General: Awake, no distress.  CV:  Good peripheral perfusion.  Heart regular rate and rhythm. Resp:  Normal effort.  Lungs are clear bilaterally. Abd:  No distention.  Other:  Lumbar spine no point tenderness on palpation.  There is a well-healed surgical scar lower lumbar area.  No point tenderness on palpation of the paravertebral muscles bilaterally.  Patient has good  muscle strength at 5/5.  Patient is able to stand and ambulate with the use of his walker.  Straight leg raises were approximately 30 degrees and was negative.   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  No findings to suggest cauda equina.   PROCEDURES:  Critical Care performed:   Procedures   MEDICATIONS ORDERED IN ED: Medications - No data to display   IMPRESSION / MDM / [place] / ED COURSE  I reviewed the triage vital signs and the nursing notes.   Differential diagnosis includes, but is not  limited to, cauda equina, chronic back pain, lumbar vertebral compression fracture.  81 year old male presents to the ED with urgent concerns that he may have something bad as he was told that if he ever became incontinent of bowel or bladder that he was to come to the emergency room immediately.  Patient did have back surgery 2 years ago and denies any problems since that time.  He is currently in physical therapy.  Patient was noted to walk with his walker to the restroom multiple times during his stay but states this was to urinate.  He denies any continued diarrhea or loss of stool.  MRI was reassuring and patient was made aware.  Patient was in hurry to leave the emergency department as he has an appointment in 30 minutes at physical therapy.  I discussed his MRI results with him and told him that he should talk with his PT today to prevent any worsening of his symptoms.  He is also encouraged to keep his appoint with Dr. Izora Ribas and  continue with his regular medications.      Patient's presentation is most consistent with acute complicated illness / injury requiring diagnostic workup.  FINAL CLINICAL IMPRESSION(S) / ED DIAGNOSES   Final diagnoses:  Chronic bilateral low back pain without sciatica  Spinal stenosis of lumbar region, unspecified whether neurogenic claudication present     Rx / DC Orders   ED Discharge Orders     None        Note:  This document was prepared using Dragon voice recognition software and may include unintentional dictation errors.   Johnn Hai, PA-C 11/18/21 1613    Duffy Bruce, MD 11/18/21 2006

## 2021-11-18 NOTE — Therapy (Signed)
OUTPATIENT PHYSICAL THERAPY TREATMENT NOTE   Patient Name: Wesley Young MRN: [id number] DOB:05/02/1940, 81 y.o., male Today's Date: 11/18/2021  PCP: Dr. Frazier Richards  REFERRING PROVIDER: Dr. Meade Maw   END OF SESSION:      Past Medical History:  Diagnosis Date   Abnormal PSA    s/p post prostate biopsy in the past which was negative   Actinic keratosis 03/14/2007   L ant lat neck at base of neck - bx proven    Actinic keratosis 01/08/2014   R calf - bx proven    Alcoholism ([place])    with prolonged hospitalization 2009 for withdrawal c/b aspiration pneumonia requiring vent   Anxiety    Atrial fibrillation and flutter ([place])    B12 deficiency    Basal cell carcinoma 03/14/2007   R distal lat tricep near elbow    Basal cell carcinoma 03/06/2014   R calf - excision 04/22/2014   Basal cell carcinoma 08/30/2021   suprasternal area, treated with EDC   Cardiomyopathy ([place])    mild probably multifactorial secondary to HTn and possible alcohol contribution. 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   L upper back paraspinal - excision    Hx of squamous cell carcinoma of skin 2014   multiple sites   Hypertension    Impotence    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[place])    Sleep apnea    Spinal stenosis    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   L forearm - excision    Squamous cell carcinoma of skin 12/16/2015   R dorsum hand    Squamous cell carcinoma of skin 10/02/2017   R dorsum hand    Varicose veins of both  lower extremities    Past Surgical History:  Procedure Laterality Date   CATARACT EXTRACTION W/PHACO Left 03/12/2019   Procedure: CATARACT EXTRACTION PHACO AND INTRAOCULAR LENS PLACEMENT ([place]) LEFT 5.27 00:38.6;  Surgeon: Birder Robson, MD;  Location: [place];  Service: Ophthalmology;  Laterality: Left;  sleep apnea   CATARACT EXTRACTION W/PHACO Right 04/09/2019   Procedure: CATARACT EXTRACTION PHACO AND INTRAOCULAR LENS PLACEMENT ([facility]) RIGHT 4.84 00:52.4;  Surgeon: Birder Robson, MD;  Location: [place];  Service: Ophthalmology;  Laterality: Right;   colonoscopy with polypectomy  12/2016   COLONOSCOPY WITH PROPOFOL N/A 12/27/2016   Procedure: COLONOSCOPY WITH PROPOFOL;  Surgeon: [place], Benay Pike, MD;  Location: [facility] ENDOSCOPY;  Service: Endoscopy;  Laterality: N/A;   COLONOSCOPY WITH PROPOFOL N/A 03/23/2020   Procedure: COLONOSCOPY WITH PROPOFOL;  Surgeon: [place], Benay Pike, MD;  Location: [facility] ENDOSCOPY;  Service: Gastroenterology;  Laterality: N/A;   ESOPHAGOGASTRODUODENOSCOPY (EGD) WITH PROPOFOL N/A 09/21/2017   Procedure: ESOPHAGOGASTRODUODENOSCOPY (EGD) WITH PROPOFOL;  Surgeon: Lollie Sails, MD;  Location[facility] ENDOSCOPY;  Service: Endoscopy;  Laterality: N/A;   ESOPHAGOGASTRODUODENOSCOPY (EGD) WITH PROPOFOL N/A 03/23/2020   Procedure: ESOPHAGOGASTRODUODENOSCOPY (EGD) WITH PROPOFOL;  Surgeon: [place],  Benay Pike, MD;  Location: [facility] ENDOSCOPY;  Service: Gastroenterology;  Laterality: N/A;   melonoma removal     prostat biopsy x2     right deltoid resection in 1977     s/p resection of his right deltoid muscle in [place]     There are no problems to display for this patient.   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  He use to run 10 miles a day when he was running and he things this might have been the cause of his low back pain.  Pt reports past spinal surgery two years ago to relieve pressure in spine because of bowel and bladder issues. He has already received a spinal injection 10 years ago, but this did not help much. He states that he is currently on tramadol which does help with the pain. He went off tramadol for a few months because of irregular blood pressure and he experienced a significant increase in his pain and it did not help blood pressure. Has not fallen   PRECAUTIONS: Falls  SUBJECTIVE: Pt reports a severe episode of bowel incontinence over night after performing sit to stand exercise with increased resistance. He went to ED for further evaluation with resulting MRI listed below. At this point, there is note a note in the EMR from physician. He reports that cauda equina was ruled out and that physician said his symptoms were from spinal stenosis. He has forward report to his neurologist Dr. Nelly Laurence office and he is scheduled to see him soon.   PAIN:  Are you having pain? No   OBJECTIVE: (objective measures completed at initial evaluation unless otherwise dated)    VITALS: BP 163/ 68 HR  68 SpO2 100   DIAGNOSTIC FINDINGS:   CLINICAL DATA:  Low back pain, spine surgery 2 years ago.   EXAM: MRI LUMBAR SPINE WITHOUT CONTRAST   TECHNIQUE: Multiplanar, multisequence MR imaging of the lumbar spine was performed. No intravenous contrast was administered.   COMPARISON:  10/05/2021   FINDINGS: Segmentation:  Standard.   Alignment: Grade 1 anterolisthesis of L3 on L4 and L4 on L5 secondary to facet disease.   Vertebrae: No acute fracture, evidence of discitis, or aggressive bone lesion.   Conus medullaris and cauda equina: Conus extends to the L1 level. Conus and cauda equina appear normal.   Paraspinal and other  soft tissues: No acute paraspinal abnormality.   Other: Partial ankylosis of the SI joints bilaterally.   Disc levels:   Disc spaces: Interbody fusion at L2-3 and L5-S1. Degenerative disease with disc height loss at L1-2, L3-4 and L4-5.   T12-L1: No significant disc bulge. No neural foraminal stenosis. No central canal stenosis. Mild bilateral facet arthropathy.   L1-L2: Broad-based disc osteophyte complex. Moderate bilateral facet arthropathy. Moderate spinal stenosis. Mild bilateral foraminal stenosis.   L2-L3: Posterior osseous ridging and interbody fusion. Moderate bilateral facet arthropathy. Mild spinal stenosis. Mild left foraminal stenosis. No right foraminal stenosis severe is a   L3-L4: Broad-based disc osteophyte complex. Severe bilateral facet arthropathy. Moderate-severe spinal stenosis. Mild bilateral foraminal stenosis.   L4-L5: Broad-based disc bulge. Severe bilateral facet arthropathy. Severe spinal stenosis. No foraminal stenosis.   L5-S1:  Interbody fusion and posterior osseous ridging. Mild bilateral facet arthropathy. No foraminal or central canal stenosis.   IMPRESSION: 1. At L1-2 there is a broad-based disc osteophyte complex. Moderate bilateral facet arthropathy. Moderate spinal stenosis. Mild bilateral foraminal stenosis. 2. At L2-3 there is posterior osseous ridging and interbody fusion. Moderate bilateral facet arthropathy. Mild spinal stenosis. Mild left foraminal stenosis. 3. At L3-4 there is a broad-based disc osteophyte complex. Severe bilateral facet arthropathy. Moderate-severe spinal stenosis. Mild bilateral foraminal stenosis. 4. At L4-5 there is a broad-based disc bulge. Severe bilateral facet arthropathy. Severe spinal stenosis. 5. No acute osseous injury of the lumbar spine. 6.  No acute osseous injury of the lumbar spine.     Electronically Signed   By: Kathreen Devoid M.D.   On: 11/18/2021 13:42   PATIENT SURVEYS:  FOTO 56/100  with target of 60    SCREENING FOR RED FLAGS: Bowel or bladder incontinence: No Spinal tumors: No Cauda equina syndrome: No Compression fracture: No Abdominal aneurysm: No   COGNITION:           Overall cognitive status: Within functional limits for tasks assessed                          SENSATION: WFLfeels some numbness and tingling in toes    MUSCLE LENGTH: Hamstrings: NT  Thomas test: NT    POSTURE: No Significant postural limitations   PALPATION: L1-L5 central spinous processes    LUMBAR ROM:    Active  A/PROM  eval  Flexion 100%  Extension 50%  Right lateral flexion 75%  Left lateral flexion 75%  Right rotation 75%  Left rotation 75%   (Blank rows = not tested)   LOWER EXTREMITY ROM:   WFL         LOWER EXTREMITY MMT:       Active  Right eval Left eval  Hip flexion 4 4-  Hip extension      Hip abduction      Hip adduction      Hip internal rotation      Hip external rotation      Knee flexion 4+ 4+  Knee extension 4+ 4+  Ankle dorsiflexion 5 5  Ankle plantarflexion      Ankle inversion      Ankle eversion       (Blank rows = not tested)    (Blank rows = not tested)   LUMBAR SPECIAL TESTS: Not performed    FUNCTIONAL TESTS:  5 times sit to stand: 13 sec  <12 sec is a risk for falls in the elderly   30 seconds chair stand test: 12 <10 for men ages 89-84   STEADI 4-Stage Balance Test: (inability to maintain tandem stance for 10 sec = increased risk for falls) - feet together, eyes open, noncompliant surface: 10/10 sec - semi-tandem stance, eyes open, noncompliant surface: 10/10 sec - tandem stances, eyes open, noncompliant surface: 4/5 sec - single leg stance: Not tested    DGI: 18/24 17/24 (11/16/21) -Minor Impairments: Head turns, turn and stop, obstacles, stairs, walking straight, fast and slow         GAIT: Distance walked: 40 ft  Assistive device utilized: Single point cane Level of assistance: Modified  independence Comments: Wide based gait with decreased step length        TODAY'S TREATMENT   11/18/21  Screen only   11/16/21  Nu-Step with resistance level 4 seat and arms 11- 5 min           DGI: 17/24          -Minor Impairments: Gait level surface, change in gait speed, gait with head turns          Sit to Stand from 18 inch height with #8 x 2 DB 1 x 6           -mod VC to stop swinging DB forward          Sit to Stand from 18 inch height with #8 x 1 DB 1 x 10          Sit to Stand from 18 inch height 1 x 10          - min VC to keep back of knees away from mat.         NMR        Figure 8s with 10 ft spacing for cones x 10        Half Tandem 4 x 10 sec        -moderate sway with intermittent use of hands        Half Tandem with horizontal head turns 2 x 10        -severe sway with stepping to regain balance and use of hands        Half Tandem with vertical head turns 2 x 10         -severe sway with stepping to regain balance and use of hands   11/03/21           Nu-Step with resistance level 4 seat and arms 11- 5 min            Prone Quad Stretch 3 x 30 sec            -Only able to perform using LUE because had h/o cancer in right shoulder           Sit to Stand #8 DB x 2 3 x 10   NMR           Half Tandem R + L Horizontal Head Turns 3 x 10           Half Tandem R + L Vertical Head Turns 3 x 10              10/26/21            Nu-Step with resistance level 4 seat and arms 11- 5 min             DGI: 18/24             -Mild Impairment: Change in gait speed, gait with horizontal and vertical head turns, gait and pivot turn, step over obstacles, and stairs             Sit to Stand 1 x 8             Sit to Stand 2 x 8 with 8 lb water jug              -min VC to decrease speed of eccentric portion             Seated HS Stretch 2 x 30 sec              Hip MMT               Adduction R/L 4-/4-  Abduction R/L 3+/3+              Ext R/L 3+/3+                       SLR: Negative bilateral, restrictions experienced at 70 degrees hip flexion              Ely's Test: + Bilateral            Ober's Test: - Bilateral              Initial  Half tandem with Horizontal head turns 2 x 10  Half tandem with vertical head turns 2 x 10      PATIENT EDUCATION:  Education details: Stop strengthening exercises especially sit to stands and follow-up with neurologist for further medical eval and treatment. Person educated: Patient Education method: Explanation, Demonstration, Verbal cues, and Handouts Education comprehension: verbalized understanding and returned demonstration     HOME EXERCISE PROGRAM: Access Code: [email or web address] URL: [email or web address] Date: 11/16/2021 Prepared by: Bradly Chris  Exercises - Half Tandem Stance Balance with Head Rotation  - 1 x daily - 7 x weekly - 3 sets - 10 reps - Sit to Stand  - 3 x weekly - 3 sets - 10 reps - Seated Table Hamstring Stretch  - 1 x daily - 3 reps - 30 hold - Walking with Head Rotation  - 1 x daily - 10 reps - Prone Quadriceps Stretch with Strap  - 1 x daily - 7 x weekly - 3 reps - 30 hold - Figure-8 Walking Around Cones  - 1 x daily - 10 reps   ASSESSMENT:   CLINICAL IMPRESSION:  Instructed to f/u with neurologist for further eval and treatment given red flag of bowel incontinence. PT will route this note to Dr. Cari Caraway for further instruction on whether to proceed with therapy.   OBJECTIVE IMPAIRMENTS Abnormal gait, decreased balance, decreased mobility, difficulty walking, decreased strength, hypomobility, impaired flexibility, and pain.    ACTIVITY LIMITATIONS carrying, lifting, bending, sitting, standing, squatting, stairs, and locomotion level   PARTICIPATION LIMITATIONS: cleaning, laundry, shopping, community activity, and yard work   PERSONAL FACTORS Age, Past/current experiences, Time since onset of injury/illness/exacerbation, and 1-2 comorbidities: h/o lumbar  surgery and h/o cancer  are also affecting patient's functional outcome.    REHAB POTENTIAL: Fair chronicity of condition and limited response to prior medical interventions    CLINICAL DECISION MAKING: Stable/uncomplicated   EVALUATION COMPLEXITY: Low     GOALS: Goals reviewed with patient? No   SHORT TERM GOALS: Target date: 11/02/2021   Pt will be independent with HEP in order to improve strength and balance in order to decrease fall risk and improve function at home and work. Baseline: NT  Goal status: Ongoing    2.  Patient will be able to perform eccentric portion of sit to stand slowly and under control with little to no compensation as evidence of improve LE strength.  Baseline: Performs sit uncontrolled and quickly Goal status: Ongoing          LONG TERM GOALS: Target date: 12/14/2021   Patient will have improved function and activity level as evidenced by an increase in FOTO score by 10 points or more.  Baseline: 56  11/16/21 52 with target of 60 Goal status: Ongoing   2.  Patient will perform 5 x STS in <12 sec to show improved LE strength and to decrease risk of falling.  Baseline: 13 sec  Goal status: Ongoing    3.  Patient will be able to perform tandem stance with right and left as back leg as evidence for improved static balance and to decrease risk of falling.  Baseline: 4 sec on RLE and 5 sec on LLE  Goal status: Ongoing    4.  Patient will demonstrate reduced falls risk as evidenced by Dynamic Gait Index (DGI) >19/24. Baseline: NT  Goal status: Ongoing        PLAN: PT FREQUENCY: 1-2x/week   PT DURATION: 8 weeks   PLANNED INTERVENTIONS: Therapeutic exercises, Neuromuscular re-education, Balance training, Gait training, Patient/Family education, Self Care, Joint mobilization, Joint manipulation, Stair training, Vestibular training, DME instructions, Aquatic Therapy, Dry Needling, Spinal manipulation, Spinal mobilization, Moist heat, Manual therapy, and  Re-evaluation.   PLAN FOR NEXT SESSION:   Progress abdominal and hip strength as well as static and dynamic balance  Bradly Chris PT, DPT  11/18/2021, 3:28 PM

## 2021-11-18 NOTE — ED Notes (Signed)
See triage note  Presents with some bowel leakage today  States he has had 2 surgeries/procedures on his back a couple of years ago   Also denies any fever or n/v

## 2021-11-18 NOTE — ED Triage Notes (Signed)
Pt states he had spine surgery 2 years ago and was told if he had bowel leakage to come to the ED. Pt states last night he had stool leakage while he was sleeping, pt denies blood in stool, pt denies bladder leakage. Pt has hx of chronic back pain.

## 2021-11-18 NOTE — Discharge Instructions (Signed)
Follow-up with your neurosurgeon and continue physical therapy as ordered by your doctor. Continue with medications as prescribed.

## 2021-11-22 ENCOUNTER — Ambulatory Visit: Payer: Medicare Other | Admitting: Physical Therapy

## 2021-11-23 ENCOUNTER — Ambulatory Visit (INDEPENDENT_AMBULATORY_CARE_PROVIDER_SITE_OTHER): Payer: Medicare Other | Admitting: Neurosurgery

## 2021-11-23 ENCOUNTER — Encounter: Payer: Medicare Other | Admitting: Physical Therapy

## 2021-11-23 VITALS — BP 150/80 | HR 65 | Ht 72.0 in | Wt 170.0 lb

## 2021-11-23 DIAGNOSIS — M48062 Spinal stenosis, lumbar region with neurogenic claudication: Secondary | ICD-10-CM

## 2021-11-23 NOTE — Progress Notes (Signed)
Referring Physician:  Meade Maw, [place],  [place] [address]  Primary Physician:  Kirk Ruths, MD  History of Present Illness: 11/23/2021 Wesley Young presents back for reevaluation.  He did visit the emergency department when he had some concern for fecal incontinence.  He is currently stable.  He has been doing physical therapy.  10/07/21 Wesley Young is here today with a chief complaint of low back pain with weakness in the left leg.  He had an event on August 13 when he had an electrical sensation down his leg with weakness and numbness.  He has gotten better since that time, but still has discomfort when he walks or stands.  Sitting down makes his pain better.  He currently has 2 out of 10 pain.  He has no bowel or bladder dysfunction currently.   Bowel/Bladder Dysfunction: none  Conservative measures:  Physical therapy:  has not participated in Multimodal medical therapy including regular antiinflammatories: tramadol Injections: has had epidural steroid injections in 2013 but none for this problem.   Past Surgery:  08/23/19: L4 laminectomy by Dr. Ignacia Bayley has no symptoms of cervical myelopathy.  The symptoms are causing a significant impact on the patient's life.   Review of Systems:  A 10 point review of systems is negative, except for the pertinent positives and negatives detailed in the HPI.  Past Medical History: Past Medical History:  Diagnosis Date   Abnormal PSA    s/p post prostate biopsy in the past which was negative   Actinic keratosis 03/14/2007   L ant lat neck at base of neck - bx proven    Actinic keratosis 01/08/2014   R calf - bx proven    Alcoholism ([place])    with prolonged hospitalization 2009 for withdrawal c/b aspiration pneumonia requiring vent   Anxiety    Atrial fibrillation and flutter ([place])    B12 deficiency    Basal cell carcinoma 03/14/2007   R distal lat tricep near elbow     Basal cell carcinoma 03/06/2014   R calf - excision 04/22/2014   Basal cell carcinoma 08/30/2021   suprasternal area, treated with EDC   Cardiomyopathy ([place])    mild probably multifactorial secondary to HTn and possible alcohol contribution. 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   L upper back paraspinal - excision    Hx of squamous cell carcinoma of skin 2014   multiple sites   Hypertension    Impotence    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[place])    Sleep apnea    Spinal stenosis    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   L forearm - excision    Squamous cell carcinoma of skin 12/16/2015   R dorsum hand    Squamous cell carcinoma of skin 10/02/2017   R dorsum hand    Varicose veins  of both lower extremities     Past Surgical History: Past Surgical History:  Procedure Laterality Date   CATARACT EXTRACTION W/PHACO Left 03/12/2019   Procedure: CATARACT EXTRACTION PHACO AND INTRAOCULAR LENS PLACEMENT (IOC) LEFT 5.27 00:38.6;  Surgeon: Birder Robson, MD;  Location: [facility];  Service: Ophthalmology;  Laterality: Left;  sleep apnea   CATARACT EXTRACTION W/PHACO Right 04/09/2019   Procedure: CATARACT EXTRACTION PHACO AND INTRAOCULAR LENS PLACEMENT (IOC) RIGHT 4.84 00:52.4;  Surgeon: Birder Robson, MD;  Location: [facility];  Service: Ophthalmology;  Laterality: Right;   colonoscopy with polypectomy  12/2016   COLONOSCOPY WITH PROPOFOL N/A 12/27/2016   Procedure: COLONOSCOPY WITH PROPOFOL;  Surgeon: Toledo,  Benay Pike, MD;  Location: [facility];  Service: Endoscopy;  Laterality: N/A;   COLONOSCOPY WITH PROPOFOL N/A 03/23/2020   Procedure: COLONOSCOPY WITH PROPOFOL;  Surgeon: Toledo, Benay Pike, MD;  Location: [facility];  Service: Gastroenterology;  Laterality: N/A;   ESOPHAGOGASTRODUODENOSCOPY (EGD) WITH PROPOFOL N/A 09/21/2017   Procedure: ESOPHAGOGASTRODUODENOSCOPY (EGD) WITH PROPOFOL;  Surgeon: Lollie Sails, MD;  Location: [facility];  Service: Endoscopy;  Laterality: N/A;   ESOPHAGOGASTRODUODENOSCOPY (EGD) WITH PROPOFOL N/A 03/23/2020   Procedure: ESOPHAGOGASTRODUODENOSCOPY (EGD) WITH PROPOFOL;  Surgeon: Toledo, Benay Pike, MD;  Location: [facility];  Service: Gastroenterology;  Laterality: N/A;   melonoma removal     prostat biopsy x2     right deltoid resection in 1977     s/p resection of his right deltoid muscle in [place]      Allergies: Allergies as of 11/23/2021 - Review Complete 11/23/2021  Allergen Reaction Noted   Ativan [lorazepam]  03/20/2020   Benadryl [diphenhydramine]  08/07/2019   Crestor [rosuvastatin calcium]  09/20/2017   Nsaids  09/20/2017   Peanut-containing drug products Swelling 08/21/2019   Rapaflo [silodosin]  09/20/2017   Statins Other (See Comments) 12/27/2016   Zetia [ezetimibe]  09/20/2017   Latex Rash 12/27/2016   Neosporin [neomycin-bacitracin zn-polymyx] Rash 12/27/2016    Medications: Current Meds  Medication Sig   acetaminophen (TYLENOL) 325 MG tablet Take 650 mg by mouth every 6 (six) hours as needed.   Apoaequorin (PREVAGEN PO) Take by mouth daily.   Ascorbic Acid (VITAMIN C PO) Take 500 mg by mouth daily.   Calcium Citrate-Vitamin D (CITRACAL + D PO) Take by mouth 2 (two) times daily.   ferrous sulfate 324 MG TBEC Take 325 mg by mouth daily with breakfast.   finasteride (PROSCAR) 5 MG tablet Take 5 mg daily by mouth.   KRILL OIL PO Take by mouth daily.   losartan (COZAAR)  25 MG tablet Take 1 tablet by mouth daily.   Misc Natural Products (TART CHERRY ADVANCED PO) Take by mouth daily.   polyethylene glycol (MIRALAX / GLYCOLAX) 17 g packet Take 17 g by mouth daily as needed.   pramipexole (MIRAPEX) 0.5 MG tablet Take 0.5 mg 3 (three) times daily by mouth.   Probiotic Product (ALIGN PO) Take daily by mouth.   tamsulosin (FLOMAX) 0.4 MG CAPS capsule Take 0.4 mg by mouth daily.   Thiamine HCl (VITAMIN B1 PO) Take 1 tablet by mouth daily.   traMADol (ULTRAM) 50 MG tablet Take 50 mg every 6 (six) hours as needed by mouth.    Social History: Social History   Tobacco Use   Smoking status: Former    Types: Cigarettes    Quit date: 1977    Years since quitting: 74.7  Smokeless tobacco: Never  Vaping Use   Vaping Use: Never used  Substance Use Topics   Alcohol use: Not Currently    Comment: quit 03/26/2008   Drug use: Not Currently    Family Medical History: No family history on file.  Physical Examination: Vitals:   11/23/21 1118  BP: (!) 150/80  Pulse: 65    General: Patient is well developed, well nourished, calm, collected, and in no apparent distress. Attention to examination is appropriate.  Neck:   Supple.  Full range of motion.  Respiratory: Patient is breathing without any difficulty.   NEUROLOGICAL:     Awake, alert, oriented to person, place, and time.  Speech is clear and fluent. Fund of knowledge is appropriate.   Cranial Nerves: Pupils equal round and reactive to light.  Facial tone is symmetric.  Facial sensation is symmetric. Shoulder shrug is symmetric. Tongue protrusion is midline.  There is no pronator drift.  Strength: Side Biceps Triceps Deltoid Interossei Grip Wrist Ext. Wrist Flex.  R '5 5 5 5 5 5 5  '$ L '5 5 5 5 5 5 5   '$ Side Iliopsoas Quads Hamstring PF DF EHL  R '5 5 5 5 5 5  '$ L '5 5 5 5 5 5   '$ Reflexes are 1+ and symmetric at the biceps, triceps, brachioradialis, patella and achilles.   Hoffman's is absent.  Clonus is  not present.  Toes are down-going.  Bilateral upper and lower extremity sensation is intact to light touch.    No evidence of dysmetria noted.  Gait is normal and requires a cane.     Medical Decision Making  Imaging: MRI L spine 10/05/21 1. Severe degenerative findings with severe spinal canal stenosis at L3-L4 and L4-L5, which raises concern for cauda equina syndrome, although this appears unchanged compared to 11/11/2019. In addition there is mild bilateral neural foraminal narrowing at these levels. 2. L1-L2 moderate spinal canal stenosis and mild-to-moderate bilateral neural foraminal narrowing.     Electronically Signed   By: Merilyn Baba M.D.   On: 10/05/2021 20:46  I have personally reviewed the images and agree with the above interpretation.  Assessment and Plan: Wesley Young is a pleasant 81 y.o. 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  He does not currently have cauda equina syndrome.  He will continue physical therapy for now.  I will see him back in 2 months for reevaluation.  He will contact me if he has any concerning symptoms.   I spent a total of 10 minutes in face-to-face and non-face-to-face activities related to this patient's care today.  Thank you for involving me in the care of this patient.      Candice Tobey K. Izora Ribas MD, [facility] Neurosurgery

## 2021-11-24 ENCOUNTER — Ambulatory Visit: Payer: Medicare Other | Admitting: Urology

## 2021-11-25 ENCOUNTER — Encounter: Payer: Medicare Other | Admitting: Physical Therapy

## 2021-11-29 ENCOUNTER — Ambulatory Visit: Payer: Medicare Other | Attending: Neurosurgery | Admitting: Physical Therapy

## 2021-11-29 ENCOUNTER — Encounter: Payer: Self-pay | Admitting: Physical Therapy

## 2021-11-29 DIAGNOSIS — R2681 Unsteadiness on feet: Secondary | ICD-10-CM | POA: Insufficient documentation

## 2021-11-29 DIAGNOSIS — M5459 Other low back pain: Secondary | ICD-10-CM | POA: Insufficient documentation

## 2021-11-29 NOTE — Therapy (Signed)
OUTPATIENT PHYSICAL THERAPY TREATMENT NOTE   Patient Name: Wesley Young MRN: [id number] DOB:[birth year], 81 y.o., male Today's Date: 11/29/2021  PCP: Dr. Frazier Richards  REFERRING PROVIDER: Dr. Meade Maw   END OF SESSION:   PT End of Session - 11/29/21 1549     Visit Number 5    Number of Visits 16    Date for PT Re-Evaluation 12/14/21    Authorization Type Medicare    Progress Note Due on Visit 10    PT Start Time 1545    PT Stop Time 1630    PT Time Calculation (min) 45 min    Equipment Utilized During Treatment Gait belt    Activity Tolerance Patient tolerated treatment well    Behavior During Therapy WFL for tasks assessed/performed               Past Medical History:  Diagnosis Date   Abnormal PSA    s/p post prostate biopsy in the past which was negative   Actinic keratosis 03/14/2007   L ant lat neck at base of neck - bx proven    Actinic keratosis 01/08/2014   R calf - bx proven    Alcoholism ([place])    with prolonged hospitalization 2009 for withdrawal c/b aspiration pneumonia requiring vent   Anxiety    Atrial fibrillation and flutter ([place])    B12 deficiency    Basal cell carcinoma 03/14/2007   R distal lat tricep near elbow    Basal cell carcinoma 03/06/2014   R calf - excision 04/22/2014   Basal cell carcinoma 08/30/2021   suprasternal area, treated with EDC   Cardiomyopathy ([place])    mild probably multifactorial secondary to HTn and possible alcohol contribution. Left ventricular ejection fraction app 45 %   CHF (congestive heart failure) (HCC)    mild left ventricular systolic dysfunction   Dental bridge present    "[name]" bridge, top - right   Depression    Duodenitis    Dysplastic nevus 01/08/2014   L prox ant thigh - mild    Dyspnea    on exertion   Dyspnea on exertion    Dysrhythmia    Erosive esophagitis    Esophageal varices (HCC)    Fibrosarcoma (HCC)    Gross hematuria    High cholesterol    Hx of melanoma in  situ 01/20/2015   L upper back paraspinal - excision    Hx of squamous cell carcinoma of skin 2014   multiple sites   Hypertension    Impotence    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[place])    Sleep apnea    Spinal stenosis    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   L forearm - excision    Squamous cell carcinoma of skin 12/16/2015   R dorsum hand    Squamous cell carcinoma of skin 10/02/2017   R dorsum hand    Varicose veins of both lower extremities    Past Surgical History:  Procedure Laterality Date   CATARACT EXTRACTION W/PHACO Left 03/12/2019   Procedure: CATARACT EXTRACTION PHACO AND INTRAOCULAR LENS PLACEMENT ([place]) LEFT 5.27 00:38.6;  Surgeon: Birder Robson, MD;  Location: [place];  Service: Ophthalmology;  Laterality: Left;  sleep apnea   CATARACT EXTRACTION W/PHACO Right 04/09/2019   Procedure: CATARACT EXTRACTION PHACO AND INTRAOCULAR LENS PLACEMENT ([facility]) RIGHT 4.84 00:52.4;  Surgeon: Birder Robson, MD;  Location: [place];  Service: Ophthalmology;  Laterality: Right;   colonoscopy with polypectomy  12/2016  COLONOSCOPY WITH PROPOFOL N/A 12/27/2016   Procedure: COLONOSCOPY WITH PROPOFOL;  Surgeon: Toledo, Benay Pike, MD;  Location: [facility] ENDOSCOPY;  Service: Endoscopy;  Laterality: N/A;   COLONOSCOPY WITH PROPOFOL N/A 03/23/2020   Procedure: COLONOSCOPY WITH PROPOFOL;  Surgeon: Toledo, Benay Pike, MD;  Location: [facility] ENDOSCOPY;  Service: Gastroenterology;  Laterality: N/A;   ESOPHAGOGASTRODUODENOSCOPY (EGD) WITH PROPOFOL N/A 09/21/2017   Procedure: ESOPHAGOGASTRODUODENOSCOPY (EGD) WITH PROPOFOL;  Surgeon: Lollie Sails, MD;  Location: [facility] ENDOSCOPY;  Service: Endoscopy;  Laterality: N/A;   ESOPHAGOGASTRODUODENOSCOPY (EGD) WITH PROPOFOL N/A 03/23/2020   Procedure: ESOPHAGOGASTRODUODENOSCOPY (EGD) WITH PROPOFOL;  Surgeon: Toledo, Benay Pike, MD;  Location: [facility] ENDOSCOPY;  Service: Gastroenterology;  Laterality: N/A;   melonoma removal     prostat  biopsy x2     right deltoid resection in 1977     s/p resection of his right deltoid muscle in [place]     There are no problems to display for this patient.   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  He use to run 10 miles a day when he was running and he things this might have been the cause of his low back pain.  Pt reports past spinal surgery two years ago to relieve pressure in spine because of bowel and bladder issues. He has already received a spinal injection 10 years ago, but this did not help much. He states that he is currently on tramadol which does help with the pain. He went off tramadol for a few months because of irregular blood pressure and he experienced a significant increase in his pain and it did not help blood pressure. Has not fallen   PRECAUTIONS: Falls  SUBJECTIVE: Pt reports a severe episode of bowel incontinence over night after performing sit to stand exercise with increased resistance. He went to ED for further evaluation with resulting MRI listed below. At this point, there is note a note in the EMR from physician. He reports that cauda equina was ruled out and that physician said his symptoms were from spinal stenosis. He has forward report to his neurologist Dr. Nelly Laurence office and he is scheduled to see him soon.   PAIN:  Are you having pain? No   OBJECTIVE: (objective measures completed at initial evaluation unless otherwise dated)    VITALS: BP 163/ 68 HR  68 SpO2 100   DIAGNOSTIC FINDINGS:   CLINICAL DATA:  Low back pain, spine surgery 2 years ago.   EXAM: MRI LUMBAR SPINE WITHOUT CONTRAST   TECHNIQUE: Multiplanar, multisequence MR imaging of the lumbar  spine was performed. No intravenous contrast was administered.   COMPARISON:  10/05/2021   FINDINGS: Segmentation:  Standard.   Alignment: Grade 1 anterolisthesis of L3 on L4 and L4 on L5 secondary to facet disease.   Vertebrae: No acute fracture, evidence of discitis, or aggressive bone lesion.   Conus medullaris and cauda equina: Conus extends to the L1 level. Conus and cauda equina appear normal.   Paraspinal and other soft tissues: No acute paraspinal abnormality.   Other: Partial ankylosis of the SI joints bilaterally.   Disc levels:   Disc spaces: Interbody fusion at L2-3 and L5-S1. Degenerative disease with disc height loss at  L1-2, L3-4 and L4-5.   T12-L1: No significant disc bulge. No neural foraminal stenosis. No central canal stenosis. Mild bilateral facet arthropathy.   L1-L2: Broad-based disc osteophyte complex. Moderate bilateral facet arthropathy. Moderate spinal stenosis. Mild bilateral foraminal stenosis.   L2-L3: Posterior osseous ridging and interbody fusion. Moderate bilateral facet arthropathy. Mild spinal stenosis. Mild left foraminal stenosis. No right foraminal stenosis severe is a   L3-L4: Broad-based disc osteophyte complex. Severe bilateral facet arthropathy. Moderate-severe spinal stenosis. Mild bilateral foraminal stenosis.   L4-L5: Broad-based disc bulge. Severe bilateral facet arthropathy. Severe spinal stenosis. No foraminal stenosis.   L5-S1: Interbody fusion and posterior osseous ridging. Mild bilateral facet arthropathy. No foraminal or central canal stenosis.   IMPRESSION: 1. At L1-2 there is a broad-based disc osteophyte complex. Moderate bilateral facet arthropathy. Moderate spinal stenosis. Mild bilateral foraminal stenosis. 2. At L2-3 there is posterior osseous ridging and interbody fusion. Moderate bilateral facet arthropathy. Mild spinal stenosis. Mild left foraminal stenosis. 3. At L3-4 there is a broad-based disc  osteophyte complex. Severe bilateral facet arthropathy. Moderate-severe spinal stenosis. Mild bilateral foraminal stenosis. 4. At L4-5 there is a broad-based disc bulge. Severe bilateral facet arthropathy. Severe spinal stenosis. 5. No acute osseous injury of the lumbar spine. 6.  No acute osseous injury of the lumbar spine.     Electronically Signed   By: Kathreen Devoid M.D.   On: 11/18/2021 13:42   PATIENT SURVEYS:  FOTO 56/100 with target of 60    SCREENING FOR RED FLAGS: Bowel or bladder incontinence: No Spinal tumors: No Cauda equina syndrome: No Compression fracture: No Abdominal aneurysm: No   COGNITION:           Overall cognitive status: Within functional limits for tasks assessed                          SENSATION: WFLfeels some numbness and tingling in toes    MUSCLE LENGTH: Hamstrings: NT  Thomas test: NT    POSTURE: No Significant postural limitations   PALPATION: L1-L5 central spinous processes    LUMBAR ROM:    Active  A/PROM  eval  Flexion 100%  Extension 50%  Right lateral flexion 75%  Left lateral flexion 75%  Right rotation 75%  Left rotation 75%   (Blank rows = not tested)   LOWER EXTREMITY ROM:   WFL         LOWER EXTREMITY MMT:       Active  Right eval Left eval  Hip flexion 4 4-  Hip extension      Hip abduction      Hip adduction      Hip internal rotation      Hip external rotation      Knee flexion 4+ 4+  Knee extension 4+ 4+  Ankle dorsiflexion 5 5  Ankle plantarflexion      Ankle inversion      Ankle eversion       (Blank rows = not tested)    (Blank rows = not tested)   LUMBAR SPECIAL TESTS: Not performed    FUNCTIONAL TESTS:  5 times sit to stand: 13 sec  <12 sec is a risk for falls in the elderly   30 seconds chair stand test: 12 <10 for men ages 1-84   STEADI 4-Stage Balance Test: (inability to maintain tandem stance for 10 sec = increased risk for falls) - feet together, eyes open, noncompliant  surface: 10/10 sec -  semi-tandem stance, eyes open, noncompliant surface: 10/10 sec - tandem stances, eyes open, noncompliant surface: 4/5 sec - single leg stance: Not tested    DGI: 18/24 17/24 (11/16/21) -Minor Impairments: Head turns, turn and stop, obstacles, stairs, walking straight, fast and slow         GAIT: Distance walked: 40 ft  Assistive device utilized: Single point cane Level of assistance: Modified independence Comments: Wide based gait with decreased step length        TODAY'S TREATMENT              11/29/21              THEREX               Nu-Step resistance level 4 with seat and arms at 6- 5 min             FOTO: 52/100             Sit to Stand with 2 x #8 DB 3 x 10              Sit to Stand with #20 in backpack 1 x 10              Curl Up with Reach 3 x 10               NMR              10 m ambulation without use of AD x 10              10 m horizontal head turns x 10                 -min VC to increase step length              10 m  x 4 with use of rollator                -Increased step length and gait speed   11/18/21  Screen only   11/16/21          Nu-Step with resistance level 4 seat and arms 11- 5 min           DGI: 17/24          -Minor Impairments: Gait level surface, change in gait speed, gait with head turns          Sit to Stand from 18 inch height with #8 x 2 DB 1 x 6           -mod VC to stop swinging DB forward          Sit to Stand from 18 inch height with #8 x 1 DB 1 x 10          Sit to Stand from 18 inch height 1 x 10          - min VC to keep back of knees away from mat.         NMR        Figure 8s with 10 ft spacing for cones x 10        Half Tandem 4 x 10 sec        -moderate sway with intermittent use of hands        Half Tandem with horizontal head turns 2 x 10        -severe sway with stepping to regain balance and use of hands  Half Tandem with vertical head turns 2 x 10         -severe sway with stepping  to regain balance and use of hands   11/03/21           Nu-Step with resistance level 4 seat and arms 11- 5 min            Prone Quad Stretch 3 x 30 sec            -Only able to perform using LUE because had h/o cancer in right shoulder           Sit to Stand #8 DB x 2 3 x 10   NMR           Half Tandem R + L Horizontal Head Turns 3 x 10           Half Tandem R + L Vertical Head Turns 3 x 10                 PATIENT EDUCATION:  Education details: Stop strengthening exercises especially sit to stands and follow-up with neurologist for further medical eval and treatment. Person educated: Patient Education method: Explanation, Demonstration, Verbal cues, and Handouts Education comprehension: verbalized understanding and returned demonstration     HOME EXERCISE PROGRAM: Access Code: [email or web address] URL: [email or web address] Date: 11/29/2021 Prepared by: Bradly Chris  Exercises - Half Tandem Stance Balance with Head Rotation  - 1 x daily - 7 x weekly - 3 sets - 10 reps - Sit to Stand  - 3 x weekly - 3 sets - 10 reps - Seated Table Hamstring Stretch  - 1 x daily - 3 reps - 30 hold - Walking with Head Rotation  - 1 x daily - 10 reps - Prone Quadriceps Stretch with Strap  - 1 x daily - 7 x weekly - 3 reps - 30 hold - Figure-8 Walking Around Cones  - 1 x daily - 10 reps - Curl Up with Reach  - 1 x daily - 3 x weekly - 3 sets - 10 reps  ASSESSMENT:   CLINICAL IMPRESSION:              Pt presents for f/u after medical eval and treatment for cauda equina. Neurologist communicated that pt was appropriate for PT despite worsening lumbar stenosis. He was able to complete all exercises without an increase in LE fatigue and LBP. Pt shows an improvement in LE strength with ability to perform sit to stands with increased resistance. He also shows an improvement in dynamic balance with ability to ambulate with no assistive device. PT recommend pt utilize rollator for longer distance  because of improved gait speed and step length and steadiness while walking with rollator compared to no AD. He will continue to benefit from skilled PT to improve LE strength and static and dynamic balance to decrease his risk of falling to remain independent in completing ADLs such as mowing lawn and grocery shopping.           OBJECTIVE IMPAIRMENTS Abnormal gait, decreased balance, decreased mobility, difficulty walking, decreased strength, hypomobility, impaired flexibility, and pain.    ACTIVITY LIMITATIONS carrying, lifting, bending, sitting, standing, squatting, stairs, and locomotion level   PARTICIPATION LIMITATIONS: cleaning, laundry, shopping, community activity, and yard work   PERSONAL FACTORS Age, Past/current experiences, Time since onset of injury/illness/exacerbation, and 1-2 comorbidities: h/o lumbar surgery and h/o cancer  are also affecting patient's functional outcome.    REHAB POTENTIAL: Fair chronicity  of condition and limited response to prior medical interventions    CLINICAL DECISION MAKING: Stable/uncomplicated   EVALUATION COMPLEXITY: Low     GOALS: Goals reviewed with patient? No   SHORT TERM GOALS: Target date: 11/02/2021   Pt will be independent with HEP in order to improve strength and balance in order to decrease fall risk and improve function at home and work. Baseline: NT  Goal status: Ongoing    2.  Patient will be able to perform eccentric portion of sit to stand slowly and under control with little to no compensation as evidence of improve LE strength.  Baseline: Performs sit uncontrolled and quickly Goal status: Ongoing          LONG TERM GOALS: Target date: 12/14/2021   Patient will have improved function and activity level as evidenced by an increase in FOTO score by 10 points or more.  Baseline: 56  11/16/21 52 with target of 60 Goal status: Ongoing   2.  Patient will perform 5 x STS in <12 sec to show improved LE strength and to decrease  risk of falling.  Baseline: 13 sec  Goal status: Ongoing    3.  Patient will be able to perform tandem stance with right and left as back leg as evidence for improved static balance and to decrease risk of falling.  Baseline: 4 sec on RLE and 5 sec on LLE  Goal status: Ongoing    4.  Patient will demonstrate reduced falls risk as evidenced by Dynamic Gait Index (DGI) >19/24. Baseline: NT  Goal status: Ongoing        PLAN: PT FREQUENCY: 1-2x/week   PT DURATION: 8 weeks   PLANNED INTERVENTIONS: Therapeutic exercises, Neuromuscular re-education, Balance training, Gait training, Patient/Family education, Self Care, Joint mobilization, Joint manipulation, Stair training, Vestibular training, DME instructions, Aquatic Therapy, Dry Needling, Spinal manipulation, Spinal mobilization, Moist heat, Manual therapy, and Re-evaluation.   PLAN FOR NEXT SESSION:   Progress abdominal and hip strength as well as static and dynamic balance  Bradly Chris PT, DPT  11/29/2021, 3:50 PM

## 2021-12-01 ENCOUNTER — Encounter: Payer: Self-pay | Admitting: Physical Therapy

## 2021-12-01 ENCOUNTER — Ambulatory Visit: Payer: Medicare Other | Admitting: Physical Therapy

## 2021-12-01 DIAGNOSIS — M5459 Other low back pain: Secondary | ICD-10-CM

## 2021-12-01 DIAGNOSIS — R2681 Unsteadiness on feet: Secondary | ICD-10-CM | POA: Diagnosis not present

## 2021-12-01 NOTE — Therapy (Signed)
OUTPATIENT PHYSICAL THERAPY TREATMENT NOTE   Patient Name: Wesley Young MRN: [id number] DOB:1940/05/18, 81 y.o., male Today's Date: 12/01/2021  PCP: Dr. Frazier Richards  REFERRING PROVIDER: Dr. Meade Maw   END OF SESSION:   PT End of Session - 12/01/21 0856     Visit Number 6    Number of Visits 16    Date for PT Re-Evaluation 12/14/21    Authorization Type Medicare    Progress Note Due on Visit 10    Equipment Utilized During Treatment Gait belt    Activity Tolerance Patient tolerated treatment well    Behavior During Therapy [facility] for tasks assessed/performed               Past Medical History:  Diagnosis Date   Abnormal PSA    s/p post prostate biopsy in the past which was negative   Actinic keratosis 03/14/2007   L ant lat neck at base of neck - bx proven    Actinic keratosis 01/08/2014   R calf - bx proven    Alcoholism ([place])    with prolonged hospitalization 2009 for withdrawal c/b aspiration pneumonia requiring vent   Anxiety    Atrial fibrillation and flutter ([place])    B12 deficiency    Basal cell carcinoma 03/14/2007   R distal lat tricep near elbow    Basal cell carcinoma 03/06/2014   R calf - excision 04/22/2014   Basal cell carcinoma 08/30/2021   suprasternal area, treated with EDC   Cardiomyopathy ([place])    mild probably multifactorial secondary to HTn and possible alcohol contribution. 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   L upper back paraspinal - excision    Hx of squamous cell  carcinoma of skin 2014   multiple sites   Hypertension    Impotence    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[place])    Sleep apnea    Spinal stenosis    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   L forearm - excision    Squamous cell carcinoma of skin 12/16/2015   R dorsum hand    Squamous cell carcinoma of skin 10/02/2017   R dorsum hand    Varicose veins of both lower extremities    Past Surgical History:  Procedure Laterality Date   CATARACT EXTRACTION W/PHACO Left 03/12/2019   Procedure: CATARACT EXTRACTION PHACO AND INTRAOCULAR LENS PLACEMENT ([place]) LEFT 5.27 00:38.6;  Surgeon: Birder Robson, MD;  Location: [place];  Service: Ophthalmology;  Laterality: Left;  sleep apnea   CATARACT EXTRACTION W/PHACO Right 04/09/2019   Procedure: CATARACT EXTRACTION PHACO AND INTRAOCULAR LENS PLACEMENT ([facility]) RIGHT 4.84 00:52.4;  Surgeon: Birder Robson, MD;  Location: [place];  Service: Ophthalmology;  Laterality: Right;   colonoscopy with polypectomy  12/2016   COLONOSCOPY WITH PROPOFOL N/A 12/27/2016   Procedure: COLONOSCOPY WITH PROPOFOL;  Surgeon: Toledo, Benay Pike, MD;  Location: [facility] ENDOSCOPY;  Service: Endoscopy;  Laterality: N/A;   COLONOSCOPY WITH PROPOFOL N/A 03/23/2020   Procedure: COLONOSCOPY WITH PROPOFOL;  Surgeon: Toledo, Benay Pike, MD;  Location: [facility] ENDOSCOPY;  Service: Gastroenterology;  Laterality: N/A;   ESOPHAGOGASTRODUODENOSCOPY (EGD) WITH PROPOFOL N/A 09/21/2017   Procedure: ESOPHAGOGASTRODUODENOSCOPY (EGD) WITH PROPOFOL;  Surgeon: Lollie Sails, MD;  Location: [facility] ENDOSCOPY;  Service: Endoscopy;  Laterality: N/A;   ESOPHAGOGASTRODUODENOSCOPY (EGD) WITH PROPOFOL N/A 03/23/2020   Procedure: ESOPHAGOGASTRODUODENOSCOPY (EGD) WITH PROPOFOL;  Surgeon: Toledo, Benay Pike, MD;  Location: [facility] ENDOSCOPY;  Service: Gastroenterology;  Laterality: N/A;   melonoma removal     prostat biopsy x2     right deltoid resection in 1977     s/p resection of his right  deltoid muscle in [place]     There are no problems to display for this patient.   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  He use to run 10 miles a day when he was running and he things this might have been the cause of his low back pain.  Pt reports past spinal surgery two years ago to relieve pressure in spine because of bowel and bladder issues. He has already received a spinal injection 10 years ago, but this did not help much. He states that he is currently on tramadol which does help with the pain. He went off tramadol for a few months because of irregular blood pressure and he experienced a significant increase in his pain and it did not help blood pressure. Has not fallen   PRECAUTIONS: Falls  SUBJECTIVE: Pt states that exercises are going well. He recently purchased a backpack that he can now load with more weight.   PAIN:  Are you having pain? No   OBJECTIVE: (objective measures completed at initial evaluation unless otherwise dated)    VITALS: BP 163/ 68 HR  68 SpO2 100   DIAGNOSTIC FINDINGS:   CLINICAL DATA:  Low back pain, spine surgery 2 years ago.   EXAM: MRI LUMBAR SPINE WITHOUT CONTRAST   TECHNIQUE: Multiplanar, multisequence MR imaging of the lumbar spine was performed. No intravenous contrast was administered.   COMPARISON:  10/05/2021   FINDINGS: Segmentation:  Standard.   Alignment: Grade 1 anterolisthesis of L3 on L4 and L4 on L5 secondary to facet disease.   Vertebrae: No acute fracture, evidence of discitis, or aggressive bone lesion.   Conus medullaris and cauda equina: Conus extends to the L1 level. Conus and cauda equina appear normal.   Paraspinal  and other soft tissues: No acute paraspinal abnormality.   Other: Partial ankylosis of the SI joints bilaterally.   Disc levels:   Disc spaces: Interbody fusion at L2-3 and L5-S1. Degenerative disease with disc height loss at L1-2, L3-4 and L4-5.   T12-L1: No significant disc bulge. No neural foraminal stenosis. No central canal stenosis. Mild bilateral facet arthropathy.   L1-L2: Broad-based disc osteophyte complex. Moderate bilateral facet arthropathy. Moderate spinal stenosis. Mild bilateral foraminal stenosis.   L2-L3: Posterior osseous ridging and interbody fusion. Moderate bilateral facet arthropathy. Mild spinal stenosis. Mild left foraminal stenosis. No right foraminal stenosis severe is a   L3-L4: Broad-based disc osteophyte complex. Severe bilateral facet arthropathy. Moderate-severe spinal stenosis.  Mild bilateral foraminal stenosis.   L4-L5: Broad-based disc bulge. Severe bilateral facet arthropathy. Severe spinal stenosis. No foraminal stenosis.   L5-S1: Interbody fusion and posterior osseous ridging. Mild bilateral facet arthropathy. No foraminal or central canal stenosis.   IMPRESSION: 1. At L1-2 there is a broad-based disc osteophyte complex. Moderate bilateral facet arthropathy. Moderate spinal stenosis. Mild bilateral foraminal stenosis. 2. At L2-3 there is posterior osseous ridging and interbody fusion. Moderate bilateral facet arthropathy. Mild spinal stenosis. Mild left foraminal stenosis. 3. At L3-4 there is a broad-based disc osteophyte complex. Severe bilateral facet arthropathy. Moderate-severe spinal stenosis. Mild bilateral foraminal stenosis. 4. At L4-5 there is a broad-based disc bulge. Severe bilateral facet arthropathy. Severe spinal stenosis. 5. No acute osseous injury of the lumbar spine. 6.  No acute osseous injury of the lumbar spine.     Electronically Signed   By: Kathreen Devoid M.D.   On: 11/18/2021 13:42   PATIENT SURVEYS:  FOTO  56/100 with target of 60    SCREENING FOR RED FLAGS: Bowel or bladder incontinence: No Spinal tumors: No Cauda equina syndrome: No Compression fracture: No Abdominal aneurysm: No   COGNITION:           Overall cognitive status: Within functional limits for tasks assessed                          SENSATION: WFLfeels some numbness and tingling in toes    MUSCLE LENGTH: Hamstrings: NT  Thomas test: NT    POSTURE: No Significant postural limitations   PALPATION: L1-L5 central spinous processes    LUMBAR ROM:    Active  A/PROM  eval  Flexion 100%  Extension 50%  Right lateral flexion 75%  Left lateral flexion 75%  Right rotation 75%  Left rotation 75%   (Blank rows = not tested)   LOWER EXTREMITY ROM:   WFL         LOWER EXTREMITY MMT:       Active  Right eval Left eval  Hip flexion 4 4-  Hip extension      Hip abduction      Hip adduction      Hip internal rotation      Hip external rotation      Knee flexion 4+ 4+  Knee extension 4+ 4+  Ankle dorsiflexion 5 5  Ankle plantarflexion      Ankle inversion      Ankle eversion       (Blank rows = not tested)    (Blank rows = not tested)   LUMBAR SPECIAL TESTS: Not performed    FUNCTIONAL TESTS:  5 times sit to stand: 13 sec  <12 sec is a risk for falls in the elderly   30 seconds chair stand test: 12 <10 for men ages 36-84   STEADI 4-Stage Balance Test: (inability to maintain tandem stance for 10 sec = increased risk for falls) - feet together, eyes open, noncompliant surface: 10/10 sec - semi-tandem stance, eyes open, noncompliant surface: 10/10 sec - tandem stances, eyes open, noncompliant surface: 4/5 sec - single leg stance: Not tested    DGI: 18/24 17/24 (11/16/21) -Minor Impairments: Head turns, turn and stop, obstacles, stairs, walking straight, fast and slow         GAIT: Distance walked: 40 ft  Assistive device utilized: Single point cane Level of assistance: Modified  independence Comments: Wide based gait with decreased step length  TODAY'S TREATMENT   12/01/21  THEREX  Nu-Step resistance level 4 with seat and arms at 8- 5 min OMEGA  Seated Rows #25 2 x 10  Seated Rows with Gray band 1 x 10  Seated Shoulder ER/IR at 0 deg abduction 1 x  5 -unable to use right shoulder because of prior injury  Quadruped shoulder flexion 1 x 10  -unable to flex right shoulder because of prior injury  Quadruped hip extension 2 x 10  -min to mod VC to bend knees to reduce force of level arm of LE               11/29/21              THEREX               Nu-Step resistance level 4 with seat and arms at 6- 5 min             FOTO: 52/100             Sit to Stand with 2 x #8 DB 3 x 10              Sit to Stand with #20 in backpack 1 x 10              Curl Up with Reach 3 x 10               NMR              10 m ambulation without use of AD x 10              10 m horizontal head turns x 10                 -min VC to increase step length              10 m  x 4 with use of rollator                -Increased step length and gait speed   11/18/21  Screen only   11/16/21          Nu-Step with resistance level 4 seat and arms 11- 5 min           DGI: 17/24          -Minor Impairments: Gait level surface, change in gait speed, gait with head turns          Sit to Stand from 18 inch height with #8 x 2 DB 1 x 6           -mod VC to stop swinging DB forward          Sit to Stand from 18 inch height with #8 x 1 DB 1 x 10          Sit to Stand from 18 inch height 1 x 10          - min VC to keep back of knees away from mat.         NMR        Figure 8s with 10 ft spacing for cones x 10        Half Tandem 4 x 10 sec        -moderate sway with intermittent use of hands        Half Tandem with horizontal head turns 2 x 10        -severe sway  with stepping to regain balance and use of hands        Half Tandem with vertical head turns 2 x 10         -severe  sway with stepping to regain balance and use of hands        PATIENT EDUCATION:  Education details: Stop strengthening exercises especially sit to stands and follow-up with neurologist for further medical eval and treatment. Person educated: Patient Education method: Explanation, Demonstration, Verbal cues, and Handouts Education comprehension: verbalized understanding and returned demonstration     HOME EXERCISE PROGRAM: Access Code: [email or web address] URL: [email or web address] Date: 11/29/2021 Prepared by: Bradly Chris  Exercises - Half Tandem Stance Balance with Head Rotation  - 1 x daily - 7 x weekly - 3 sets - 10 reps - Sit to Stand  - 3 x weekly - 3 sets - 10 reps - Seated Table Hamstring Stretch  - 1 x daily - 3 reps - 30 hold - Walking with Head Rotation  - 1 x daily - 10 reps - Prone Quadriceps Stretch with Strap  - 1 x daily - 7 x weekly - 3 reps - 30 hold - Figure-8 Walking Around Cones  - 1 x daily - 10 reps - Curl Up with Reach  - 1 x daily - 3 x weekly - 3 sets - 10 reps  ASSESSMENT:   CLINICAL IMPRESSION:  Pt exhibits improved LE strength with ability to perform quadruped hip extension. He was limited in parascapular strengthening because of injured right deltoid that he sustained while in service. He is limited in his ability to abduct and ER right arm so all exercises kept to 0 deg abduction and 0 deg ER. Pt does not want to utilize rollator or 2 wheeled walker, and he would rather only utilize [facility] at this point. He will continue to benefit from skilled PT to improve LE strength and static and dynamic balance to decrease his risk of falling to remain independent in completing ADLs such as mowing lawn and grocery shopping.   OBJECTIVE IMPAIRMENTS Abnormal gait, decreased balance, decreased mobility, difficulty walking, decreased strength, hypomobility, impaired flexibility, and pain.    ACTIVITY LIMITATIONS carrying, lifting, bending, sitting, standing,  squatting, stairs, and locomotion level   PARTICIPATION LIMITATIONS: cleaning, laundry, shopping, community activity, and yard work   PERSONAL FACTORS Age, Past/current experiences, Time since onset of injury/illness/exacerbation, and 1-2 comorbidities: h/o lumbar surgery and h/o cancer  are also affecting patient's functional outcome.    REHAB POTENTIAL: Fair chronicity of condition and limited response to prior medical interventions    CLINICAL DECISION MAKING: Stable/uncomplicated   EVALUATION COMPLEXITY: Low     GOALS: Goals reviewed with patient? No   SHORT TERM GOALS: Target date: 11/02/2021   Pt will be independent with HEP in order to improve strength and balance in order to decrease fall risk and improve function at home and work. Baseline: NT  Goal status: Ongoing    2.  Patient will be able to perform eccentric portion of sit to stand slowly and under control with little to no compensation as evidence of improve LE strength.  Baseline: Performs sit uncontrolled and quickly Goal status: Ongoing          LONG TERM GOALS: Target date: 12/14/2021   Patient will have improved function and activity level as evidenced by an increase in FOTO score by 10 points or more.  Baseline: 56  11/16/21 52 with target of 60 Goal status: Ongoing  2.  Patient will perform 5 x STS in <12 sec to show improved LE strength and to decrease risk of falling.  Baseline: 13 sec  Goal status: Ongoing    3.  Patient will be able to perform tandem stance with right and left as back leg as evidence for improved static balance and to decrease risk of falling.  Baseline: 4 sec on RLE and 5 sec on LLE  Goal status: Ongoing    4.  Patient will demonstrate reduced falls risk as evidenced by Dynamic Gait Index (DGI) >19/24. Baseline:17/24  Goal status: Ongoing        PLAN: PT FREQUENCY: 1-2x/week   PT DURATION: 8 weeks   PLANNED INTERVENTIONS: Therapeutic exercises, Neuromuscular  re-education, Balance training, Gait training, Patient/Family education, Self Care, Joint mobilization, Joint manipulation, Stair training, Vestibular training, DME instructions, Aquatic Therapy, Dry Needling, Spinal manipulation, Spinal mobilization, Moist heat, Manual therapy, and Re-evaluation.   PLAN FOR NEXT SESSION:   Dynamic balance over uneven surfaces and with head turns. Progress abdominal and hip strength.   Bradly Chris PT, DPT  12/01/2021, 8:57 AM

## 2021-12-02 ENCOUNTER — Ambulatory Visit: Payer: Medicare Other | Admitting: Neurosurgery

## 2021-12-02 ENCOUNTER — Encounter: Payer: Medicare Other | Admitting: Physical Therapy

## 2021-12-06 ENCOUNTER — Ambulatory Visit: Payer: Medicare Other | Admitting: Physical Therapy

## 2021-12-06 ENCOUNTER — Encounter: Payer: Self-pay | Admitting: Physical Therapy

## 2021-12-06 DIAGNOSIS — R2681 Unsteadiness on feet: Secondary | ICD-10-CM

## 2021-12-06 DIAGNOSIS — M5459 Other low back pain: Secondary | ICD-10-CM

## 2021-12-06 NOTE — Therapy (Unsigned)
OUTPATIENT PHYSICAL THERAPY TREATMENT NOTE   Patient Name: FIDEL CAGGIANO MRN: [id number] DOB:11-02-1940, 81 y.o., male Today's Date: 12/06/2021  PCP: Dr. Frazier Richards  REFERRING PROVIDER: Dr. Meade Maw   END OF SESSION:   PT End of Session - 12/06/21 1549     Visit Number 7    Number of Visits 16    Date for PT Re-Evaluation 12/14/21    Authorization Type Medicare    Authorization Time Period 12/14/21    Progress Note Due on Visit 10    PT Start Time 1545    PT Stop Time 1630    PT Time Calculation (min) 45 min    Equipment Utilized During Treatment Gait belt    Activity Tolerance Patient tolerated treatment well    Behavior During Therapy WFL for tasks assessed/performed               Past Medical History:  Diagnosis Date   Abnormal PSA    s/p post prostate biopsy in the past which was negative   Actinic keratosis 03/14/2007   L ant lat neck at base of neck - bx proven    Actinic keratosis 01/08/2014   R calf - bx proven    Alcoholism ([place])    with prolonged hospitalization 2009 for withdrawal c/b aspiration pneumonia requiring vent   Anxiety    Atrial fibrillation and flutter ([place])    B12 deficiency    Basal cell carcinoma 03/14/2007   R distal lat tricep near elbow    Basal cell carcinoma 03/06/2014   R calf - excision 04/22/2014   Basal cell carcinoma 08/30/2021   suprasternal area, treated with EDC   Cardiomyopathy ([place])    mild probably multifactorial secondary to HTn and possible alcohol contribution. 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   L upper back paraspinal - excision    Hx of squamous cell carcinoma of skin 2014   multiple sites   Hypertension    Impotence    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[place])    Sleep apnea    Spinal stenosis    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   L forearm - excision    Squamous cell carcinoma of skin 12/16/2015   R dorsum hand    Squamous cell carcinoma of skin 10/02/2017   R dorsum hand    Varicose veins of both lower extremities    Past Surgical History:  Procedure Laterality Date   CATARACT EXTRACTION W/PHACO Left 03/12/2019   Procedure: CATARACT EXTRACTION PHACO AND INTRAOCULAR LENS PLACEMENT ([place]) LEFT 5.27 00:38.6;  Surgeon: Birder Robson, MD;  Location: [place];  Service: Ophthalmology;  Laterality: Left;  sleep apnea   CATARACT EXTRACTION W/PHACO Right 04/09/2019   Procedure: CATARACT EXTRACTION PHACO AND INTRAOCULAR LENS PLACEMENT ([facility]) RIGHT 4.84 00:52.4;  Surgeon: Birder Robson, MD;  Location: [place];  Service: Ophthalmology;  Laterality: Right;  colonoscopy with polypectomy  12/2016   COLONOSCOPY WITH PROPOFOL N/A 12/27/2016   Procedure: COLONOSCOPY WITH PROPOFOL;  Surgeon: Toledo, Benay Pike, MD;  Location: [facility] END[facility]COPY;  Service: Endoscopy;  Laterality: N/A;   COLONOSCOPY WITH PROPOFOL N/A 03/23/2020   Procedure: COLONOSCOPY WITH PROPOFOL;  Surgeon: Toledo, Benay Pike, MD;  Location: [facility] END[facility]COPY;  Service: Gastroenterology;  Laterality: N/A;   ESOPHAGOGASTRODUODENOSCOPY (EGD) WITH PROPOFOL N/A 09/21/2017   Procedure: ESOPHAGOGASTRODUODENOSCOPY (EGD) WITH PROPOFOL;  Surgeon: Lollie Sails, MD;  [facility];  Service: Endoscopy;  Laterality: N/A;   ESOPHAGOGASTRODUODENOSCOPY (EGD) WITH PROPOFOL N/A 03/23/2020   Procedure: ESOPHAGOGASTRODUODENOSCOPY (EGD) WITH PROPOFOL;  Surgeon: Toledo, Benay Pike, MD;  Location: [facility] ENDOSCOPY;  Service: Gastroenterology;  Laterality:  N/A;   melonoma removal     prostat biopsy x2     right deltoid resection in 1977     s/p resection of his right deltoid muscle in [place]     There are no problems to display for this patient.   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  He use to run 10 miles a day when he was running and he things this might have been the cause of his low back pain.  Pt reports past spinal surgery two years ago to relieve pressure in spine because of bowel and bladder issues. He has already received a spinal injection 10 years ago, but this did not help much. He states that he is currently on tramadol which does help with the pain. He went off tramadol for a few months because of irregular blood pressure and he experienced a significant increase in his pain and it did not help blood pressure. Has not fallen   PRECAUTIONS: Falls  SUBJECTIVE: Pt reports that he feels that his balance is improved since doing exercises. He has not had any falls since last session.    PAIN:  Are you having pain? No   OBJECTIVE: (objective measures completed at initial evaluation unless otherwise dated)    VITALS: BP 163/ 68 HR  68 SpO2 100   DIAGNOSTIC FINDINGS:   CLINICAL DATA:  Low back pain, spine surgery 2 years ago.   EXAM: MRI LUMBAR SPINE WITHOUT CONTRAST   TECHNIQUE: Multiplanar, multisequence MR imaging of the lumbar spine was performed. No intravenous contrast was administered.   COMPARISON:  10/05/2021   FINDINGS: Segmentation:  Standard.   Alignment: Grade 1 anterolisthesis of L3 on L4 and L4 on L5 secondary to facet disease.   Vertebrae: No acute fracture, evidence of discitis, or aggressive bone  lesion.   Conus medullaris and cauda equina: Conus extends to the L1 level. Conus and cauda equina appear normal.   Paraspinal and other soft tissues: No acute paraspinal abnormality.   Other: Partial ankylosis of the SI joints bilaterally.   Disc levels:   Disc spaces: Interbody fusion at L2-3 and L5-S1. Degenerative disease with disc height loss at L1-2, L3-4 and L4-5.   T12-L1: No significant disc bulge. No neural foraminal stenosis. No central canal stenosis. Mild bilateral facet arthropathy.   L1-L2: Broad-based disc osteophyte complex. Moderate bilateral facet arthropathy. Moderate spinal stenosis. Mild bilateral foraminal stenosis.   L2-L3: Posterior osseous ridging and interbody fusion.  Moderate bilateral facet arthropathy. Mild spinal stenosis. Mild left foraminal stenosis. No right foraminal stenosis severe is a   L3-L4: Broad-based disc osteophyte complex. Severe bilateral facet arthropathy. Moderate-severe spinal stenosis. Mild bilateral foraminal stenosis.   L4-L5: Broad-based disc bulge. Severe bilateral facet arthropathy. Severe spinal stenosis. No foraminal stenosis.   L5-S1: Interbody fusion and posterior osseous ridging. Mild bilateral facet arthropathy. No foraminal or central canal stenosis.   IMPRESSION: 1. At L1-2 there is a broad-based disc osteophyte complex. Moderate bilateral facet arthropathy. Moderate spinal stenosis. Mild bilateral foraminal stenosis. 2. At L2-3 there is posterior osseous ridging and interbody fusion. Moderate bilateral facet arthropathy. Mild spinal stenosis. Mild left foraminal stenosis. 3. At L3-4 there is a broad-based disc osteophyte complex. Severe bilateral facet arthropathy. Moderate-severe spinal stenosis. Mild bilateral foraminal stenosis. 4. At L4-5 there is a broad-based disc bulge. Severe bilateral facet arthropathy. Severe spinal stenosis. 5. No acute osseous injury of the lumbar spine. 6.  No acute osseous  injury of the lumbar spine.     Electronically Signed   By: Kathreen Devoid M.D.   On: 11/18/2021 13:42   PATIENT SURVEYS:  FOTO 56/100 with target of 60    SCREENING FOR RED FLAGS: Bowel or bladder incontinence: No Spinal tumors: No Cauda equina syndrome: No Compression fracture: No Abdominal aneurysm: No   COGNITION:           Overall cognitive status: Within functional limits for tasks assessed                          SENSATION: WFLfeels some numbness and tingling in toes    MUSCLE LENGTH: Hamstrings: NT  Thomas test: NT    POSTURE: No Significant postural limitations   PALPATION: L1-L5 central spinous processes    LUMBAR ROM:    Active  A/PROM  eval  Flexion 100%  Extension 50%  Right lateral flexion 75%  Left lateral flexion 75%  Right rotation 75%  Left rotation 75%   (Blank rows = not tested)   LOWER EXTREMITY ROM:   WFL         LOWER EXTREMITY MMT:       Active  Right eval Left eval  Hip flexion 4 4-  Hip extension      Hip abduction      Hip adduction      Hip internal rotation      Hip external rotation      Knee flexion 4+ 4+  Knee extension 4+ 4+  Ankle dorsiflexion 5 5  Ankle plantarflexion      Ankle inversion      Ankle eversion       (Blank rows = not tested)    (Blank rows = not tested)   LUMBAR SPECIAL TESTS: Not performed    FUNCTIONAL TESTS:  5 times sit to stand: 13 sec  <12 sec is a risk for falls in the elderly   30 seconds chair stand test: 12 <10 for men ages 10-84   STEADI 4-Stage Balance Test: (inability to maintain tandem stance for 10 sec = increased risk for falls) - feet together, eyes open, noncompliant surface: 10/10 sec - semi-tandem stance, eyes open, noncompliant surface: 10/10 sec - tandem stances, eyes open, noncompliant surface: 4/5 sec - single leg stance: Not tested    DGI: 18/24 17/24 (11/16/21) -Minor Impairments: Head turns, turn and stop, obstacles, stairs, walking straight, fast and  slow  GAIT: Distance walked: 40 ft  Assistive device utilized: Single point cane Level of assistance: Modified independence Comments: Wide based gait with decreased step length        TODAY'S TREATMENT              12/06/21            THEREX              Nu-Step resistance level 4 with seat and arms at 8- 5 min            Seated Abdominal Crunches with arms crossed 3 x 10             Quadruped Hip Extension 3 x 10             NMR                       10 meter x 6 pt walking without AD            10 meter x 6 with horizontal head turns            10 meter x 6 with vertical head turns            10 meter x 8 with 3-1 ft obstacles            10 meter 8 cone weaves   x 8            Half Tandem with horizontal head turns 2 x 10             -Moderate ankle sway with left foot back            Half Tandem with vertical head turns 2 x 10            Half tandem with eyes closed 2 x 30 sec              -Pt is unable to hold position            Romberg with eyes closed 2 x 30 sec              12/01/21  THEREX  Nu-Step resistance level 4 with seat and arms at 8- 5 min OMEGA  Seated Rows #25 2 x 10  Seated Rows with Gray band 1 x 10  Seated Shoulder ER/IR at 0 deg abduction 1 x  5 -unable to use right shoulder because of prior injury  Quadruped shoulder flexion 1 x 10  -unable to flex right shoulder because of prior injury  Quadruped hip extension 2 x 10  -min to mod VC to bend knees to reduce force of level arm of LE               11/29/21              THEREX               Nu-Step resistance level 4 with seat and arms at 6- 5 min             FOTO: 52/100             Sit to Stand with 2 x #8 DB 3 x 10              Sit to Stand with #20 in backpack 1 x 10              Curl Up with Reach 3  x 10               NMR              10 m ambulation without use of AD x 10              10 m horizontal head turns x 10                 -min VC to increase step length               10 m  x 4 with use of rollator                -Increased step length and gait speed         PATIENT EDUCATION:  Education details: Stop strengthening exercises especially sit to stands and follow-up with neurologist for further medical eval and treatment. Person educated: Patient Education method: Explanation, Demonstration, Verbal cues, and Handouts Education comprehension: verbalized understanding and returned demonstration     HOME EXERCISE PROGRAM: Access Code: [email or web address] URL: [email or web address] Date: 12/06/2021 Prepared by: Bradly Chris  Exercises - Half Tandem Stance Balance with Head Rotation  - 1 x daily - 7 x weekly - 3 sets - 10 reps - Sit to Stand  - 3 x weekly - 3 sets - 10 reps - Seated Table Hamstring Stretch  - 1 x daily - 3 reps - 30 hold - Walking with Head Rotation  - 1 x daily - 10 reps - Prone Quadriceps Stretch with Strap  - 1 x daily - 7 x weekly - 3 reps - 30 hold - Figure-8 Walking Around [place]es  - 1 x daily - 10 reps - Curl Up with Reach  - 1 x daily - 3 x weekly - 3 sets - 10 reps - Quadruped Hip Extension Kicks  - 1 x daily - 3 x weekly - 3 sets - 10 reps - Seated Shoulder Row with Anchored Resistance  - 1 x daily - 3 x weekly - 3 sets - 10 reps - Romberg Stance with Eyes Closed  - 1 x daily - 5 reps - 30 sec  hold  ASSESSMENT:   CLINICAL IMPRESSION:  Pt shows an improvement in static and dynamic balance with ability to walk and perform dynamic balance tasks without UE support or use of an AD. He continues to show decreased step length and forward flexed posture because of stenosis and inability to achieve lumbar extension. PT recommends that pt can stop using AD in house because of improved balance. He does show significant reliance on visual input to maintain balance as evidenced by his inability to maintain balance with eyes closed. He will continue to benefit from skilled PT to improve LE strength and static and dynamic balance  to decrease his risk of falling to remain independent in completing ADLs such as mowing lawn and grocery shopping.  OBJECTIVE IMPAIRMENTS Abnormal gait, decreased balance, decreased mobility, difficulty walking, decreased strength, hypomobility, impaired flexibility, and pain.    ACTIVITY LIMITATIONS carrying, lifting, bending, sitting, standing, squatting, stairs, and locomotion level   PARTICIPATION LIMITATIONS: cleaning, laundry, shopping, community activity, and yard work   PERSONAL FACTORS Age, Past/current experiences, Time since onset of injury/illness/exacerbation, and 1-2 comorbidities: h/o lumbar surgery and h/o cancer  are also affecting patient's functional outcome.    REHAB POTENTIAL: Fair chronicity of condition and limited response to prior medical interventions    CLINICAL DECISION MAKING: Stable/uncomplicated   EVALUATION  COMPLEXITY: Low     GOALS: Goals reviewed with patient? No   SHORT TERM GOALS: Target date: 11/02/2021   Pt will be independent with HEP in order to improve strength and balance in order to decrease fall risk and improve function at home and work. Baseline: NT  Goal status: Ongoing    2.  Patient will be able to perform eccentric portion of sit to stand slowly and under control with little to no compensation as evidence of improve LE strength.  Baseline: Performs sit uncontrolled and quickly Goal status: Ongoing          LONG TERM GOALS: Target date: 12/14/2021   Patient will have improved function and activity level as evidenced by an increase in FOTO score by 10 points or more.  Baseline: 56  11/16/21 52 with target of 60 Goal status: Ongoing   2.  Patient will perform 5 x STS in <12 sec to show improved LE strength and to decrease risk of falling.  Baseline: 13 sec  Goal status: Ongoing    3.  Patient will be able to perform tandem stance with right and left as back leg as evidence for improved static balance and to decrease risk of  falling.  Baseline: 4 sec on RLE and 5 sec on LLE  Goal status: Ongoing    4.  Patient will demonstrate reduced falls risk as evidenced by Dynamic Gait Index (DGI) >19/24. Baseline:17/24  Goal status: Ongoing        PLAN: PT FREQUENCY: 1-2x/week   PT DURATION: 8 weeks   PLANNED INTERVENTIONS: Therapeutic exercises, Neuromuscular re-education, Balance training, Gait training, Patient/Family education, Self Care, Joint mobilization, Joint manipulation, Stair training, Vestibular training, DME instructions, Aquatic Therapy, Dry Needling, Spinal manipulation, Spinal mobilization, Moist heat, Manual therapy, and Re-evaluation.   PLAN FOR NEXT SESSION:   Attempt to ambulate outside. Dynamic balance over uneven surfaces and with head turns. Progress abdominal and hip strength.   Bradly Chris PT, DPT  12/06/2021, 3:50 PM

## 2021-12-08 ENCOUNTER — Encounter: Payer: Self-pay | Admitting: Physical Therapy

## 2021-12-08 ENCOUNTER — Ambulatory Visit: Payer: Medicare Other | Admitting: Physical Therapy

## 2021-12-08 DIAGNOSIS — M5459 Other low back pain: Secondary | ICD-10-CM

## 2021-12-08 DIAGNOSIS — R2681 Unsteadiness on feet: Secondary | ICD-10-CM

## 2021-12-08 NOTE — Therapy (Signed)
OUTPATIENT PHYSICAL THERAPY TREATMENT NOTE/ Re-Certification    Dates of Reporting: 10/19/21-12/08/21   Patient Name: Wesley Young MRN: [id number] DOB:[birth year], 81 y.o., male Today's Date: 12/08/2021  PCP: Dr. Frazier Richards  REFERRING PROVIDER: Dr. Meade Maw   END OF SESSION:   PT End of Session - 12/08/21 1550     Visit Number 8    Number of Visits 16    Date for PT Re-Evaluation 12/14/21    Authorization Type Medicare    Authorization Time Period 12/14/21    Authorization - Visit Number 8    Authorization - Number of Visits 20    Progress Note Due on Visit 10    PT Start Time 9562    PT Stop Time 1630    PT Time Calculation (min) 40 min    Equipment Utilized During Treatment Gait belt    Activity Tolerance Patient tolerated treatment well    Behavior During Therapy WFL for tasks assessed/performed                Past Medical History:  Diagnosis Date   Abnormal PSA    s/p post prostate biopsy in the past which was negative   Actinic keratosis 03/14/2007   L ant lat neck at base of neck - bx proven    Actinic keratosis 01/08/2014   R calf - bx proven    Alcoholism ([place])    with prolonged hospitalization 2009 for withdrawal c/b aspiration pneumonia requiring vent   Anxiety    Atrial fibrillation and flutter ([place])    B12 deficiency    Basal cell carcinoma 03/14/2007   R distal lat tricep near elbow    Basal cell carcinoma 03/06/2014   R calf - excision 04/22/2014   Basal cell carcinoma 08/30/2021   suprasternal area, treated with EDC   Cardiomyopathy ([place])    mild probably multifactorial secondary to HTn and possible alcohol contribution. 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   L upper back paraspinal - excision    Hx of squamous cell carcinoma of skin 2014   multiple sites   Hypertension    Impotence    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[place])    Sleep apnea    Spinal stenosis    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   L forearm - excision    Squamous cell carcinoma of skin 12/16/2015   R dorsum hand    Squamous cell carcinoma of skin 10/02/2017   R dorsum hand    Varicose veins of both lower extremities    Past Surgical History:  Procedure Laterality Date   CATARACT EXTRACTION W/PHACO Left 03/12/2019   Procedure: CATARACT EXTRACTION PHACO AND INTRAOCULAR LENS PLACEMENT ([place]) LEFT 5.27 00:38.6;  Surgeon: Birder Robson, MD;  Location: [place];  Service: Ophthalmology;  Laterality: Left;  sleep apnea   CATARACT EXTRACTION W/PHACO Right 04/09/2019   Procedure: CATARACT EXTRACTION  PHACO AND INTRAOCULAR LENS PLACEMENT ([facility]) RIGHT 4.84 00:52.4;  Surgeon: Birder Robson, MD;  Location: [place];  Service: Ophthalmology;  Laterality: Right;   colonoscopy with polypectomy  12/2016   COLONOSCOPY WITH PROPOFOL N/A 12/27/2016   Procedure: COLONOSCOPY WITH PROPOFOL;  Surgeon: Toledo, Benay Pike, MD;  Location: [facility] END[facility]COPY;  Service: Endoscopy;  Laterality: N/A;   COLONOSCOPY WITH PROPOFOL N/A 03/23/2020   Procedure: COLONOSCOPY WITH PROPOFOL;  Surgeon: Toledo, Benay Pike, MD;  Location: [facility] END[facility];  Service: Gastroenterology;  Laterality: N/A;   ESOPHAGOGASTRODUODENOSCOPY (EGD) WITH PROPOFOL N/A 09/21/2017   Procedure: ESOPHAGOGASTRODUODENOSCOPY (EGD) WITH PROPOFOL;  Surgeon: Lollie Sails, MD;  [facility];  Service: Endoscopy;  Laterality: N/A;   ESOPHAGOGASTRODUODENOSCOPY (EGD) WITH PROPOFOL N/A 03/23/2020   Procedure:  ESOPHAGOGASTRODUODENOSCOPY (EGD) WITH PROPOFOL;  Surgeon: Toledo, Benay Pike, MD;  Location: [facility] ENDOSCOPY;  Service: Gastroenterology;  Laterality: N/A;   melonoma removal     prostat biopsy x2     right deltoid resection in 1977     s/p resection of his right deltoid muscle in [place]     There are no problems to display for this patient.   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  He use to run 10 miles a day when he was running and he things this might have been the cause of his low back pain.  Pt reports past spinal surgery two years ago to relieve pressure in spine because of bowel and bladder issues. He has already received a spinal injection 10 years ago, but this did not help much. He states that he is currently on tramadol which does help with the pain. He went off tramadol for a few months because of irregular blood pressure and he experienced a significant increase in his pain and it did not help blood pressure. Has not fallen   PRECAUTIONS: Falls  SUBJECTIVE: Pt reports that he feels that his balance is improved since doing exercises. He has not had any falls since last session.    PAIN:  Are you having pain? No   OBJECTIVE: (objective measures completed at initial evaluation unless otherwise dated)    VITALS: BP 163/ 68 HR  68 SpO2 100   DIAGNOSTIC FINDINGS:   CLINICAL DATA:  Low back pain, spine surgery 2 years ago.   EXAM: MRI LUMBAR SPINE WITHOUT CONTRAST   TECHNIQUE: Multiplanar, multisequence MR imaging of the lumbar spine was performed. No intravenous contrast was administered.   COMPARISON:  10/05/2021   FINDINGS: Segmentation:  Standard.   Alignment: Grade 1  anterolisthesis of L3 on L4 and L4 on L5 secondary to facet disease.   Vertebrae: No acute fracture, evidence of discitis, or aggressive bone lesion.   Conus medullaris and cauda equina: Conus extends to the L1 level. Conus and cauda equina appear normal.   Paraspinal and other soft tissues: No acute paraspinal abnormality.   Other: Partial ankylosis of the SI joints bilaterally.   Disc levels:   Disc spaces: Interbody fusion at L2-3 and L5-S1. Degenerative disease with disc height loss at L1-2, L3-4 and L4-5.   T12-L1: No significant disc bulge. No neural foraminal stenosis. No central canal stenosis. Mild bilateral facet arthropathy.  L1-L2: Broad-based disc osteophyte complex. Moderate bilateral facet arthropathy. Moderate spinal stenosis. Mild bilateral foraminal stenosis.   L2-L3: Posterior osseous ridging and interbody fusion. Moderate bilateral facet arthropathy. Mild spinal stenosis. Mild left foraminal stenosis. No right foraminal stenosis severe is a   L3-L4: Broad-based disc osteophyte complex. Severe bilateral facet arthropathy. Moderate-severe spinal stenosis. Mild bilateral foraminal stenosis.   L4-L5: Broad-based disc bulge. Severe bilateral facet arthropathy. Severe spinal stenosis. No foraminal stenosis.   L5-S1: Interbody fusion and posterior osseous ridging. Mild bilateral facet arthropathy. No foraminal or central canal stenosis.   IMPRESSION: 1. At L1-2 there is a broad-based disc osteophyte complex. Moderate bilateral facet arthropathy. Moderate spinal stenosis. Mild bilateral foraminal stenosis. 2. At L2-3 there is posterior osseous ridging and interbody fusion. Moderate bilateral facet arthropathy. Mild spinal stenosis. Mild left foraminal stenosis. 3. At L3-4 there is a broad-based disc osteophyte complex. Severe bilateral facet arthropathy. Moderate-severe spinal stenosis. Mild bilateral foraminal stenosis. 4. At L4-5 there is a broad-based  disc bulge. Severe bilateral facet arthropathy. Severe spinal stenosis. 5. No acute osseous injury of the lumbar spine. 6.  No acute osseous injury of the lumbar spine.     Electronically Signed   By: Kathreen Devoid M.D.   On: 11/18/2021 13:42   PATIENT SURVEYS:  FOTO 56/100 with target of 60    SCREENING FOR RED FLAGS: Bowel or bladder incontinence: No Spinal tumors: No Cauda equina syndrome: No Compression fracture: No Abdominal aneurysm: No   COGNITION:           Overall cognitive status: Within functional limits for tasks assessed                          SENSATION: WFLfeels some numbness and tingling in toes    MUSCLE LENGTH: Hamstrings: NT  Thomas test: NT    POSTURE: No Significant postural limitations   PALPATION: L1-L5 central spinous processes    LUMBAR ROM:    Active  A/PROM  eval  Flexion 100%  Extension 50%  Right lateral flexion 75%  Left lateral flexion 75%  Right rotation 75%  Left rotation 75%   (Blank rows = not tested)   LOWER EXTREMITY ROM:   WFL         LOWER EXTREMITY MMT:       Active  Right eval Left eval  Hip flexion 4 4-  Hip extension      Hip abduction      Hip adduction      Hip internal rotation      Hip external rotation      Knee flexion 4+ 4+  Knee extension 4+ 4+  Ankle dorsiflexion 5 5  Ankle plantarflexion      Ankle inversion      Ankle eversion       (Blank rows = not tested)    (Blank rows = not tested)   LUMBAR SPECIAL TESTS: Not performed    FUNCTIONAL TESTS:  5 times sit to stand: 13 sec  <12 sec is a risk for falls in the elderly   30 seconds chair stand test: 12 <10 for men ages 16-84   STEADI 4-Stage Balance Test: (inability to maintain tandem stance for 10 sec = increased risk for falls) - feet together, eyes open, noncompliant surface: 10/10 sec - semi-tandem stance, eyes open, noncompliant surface: 10/10 sec - tandem stances, eyes open, noncompliant surface: 4/5 sec - single leg  stance: Not tested  DGI: 18/24 17/24 (11/16/21) -Minor Impairments: Head turns, turn and stop, obstacles, stairs, walking straight, fast and slow         GAIT: Distance walked: 40 ft  Assistive device utilized: Single point cane Level of assistance: Modified independence Comments: Wide based gait with decreased step length        TODAY'S TREATMENT   12/08/21  THEREX  1,000 ft walk without use of cane over grass and up and down hills (back parking lot) 5xSTS: 12.5 sec  Mini-Squat 1 x 10 -Lack of eccentric control  Mini-Squat with #8 DB held out in front 2 x 10     NMR  Romberg with eyes closed arms out by side to 30 deg abduction -30 sec   Romberg with eyes closed arms by side 30 sec   Romberg with eyes closed and with feet slightly apart 2 x 30 sec   Tandem 3 x 30 sec    -Needs to end arms out to side to be able to hold pose with RLE back               12/06/21            THEREX              Nu-Step resistance level 4 with seat and arms at 8- 5 min            Seated Abdominal Crunches with arms crossed 3 x 10             Quadruped Hip Extension 3 x 10             NMR                       10 meter x 6 pt walking without AD            10 meter x 6 with horizontal head turns            10 meter x 6 with vertical head turns            10 meter x 8 with 3-1 ft obstacles            10 meter 8 cone weaves   x 8            Half Tandem with horizontal head turns 2 x 10             -Moderate ankle sway with left foot back            Half Tandem with vertical head turns 2 x 10            Half tandem with eyes closed 2 x 30 sec              -Pt is unable to hold position            Romberg with eyes closed 2 x 30 sec              12/01/21  THEREX  Nu-Step resistance level 4 with seat and arms at 8- 5 min OMEGA  Seated Rows #25 2 x 10  Seated Rows with Gray band 1 x 10  Seated Shoulder ER/IR at 0 deg abduction 1 x  5 -unable to use right shoulder because of  prior injury  Quadruped shoulder flexion 1 x 10  -unable to flex right shoulder because of prior injury  Quadruped hip extension 2 x 10  -min  to mod VC to bend knees to reduce force of level arm of LE               11/29/21              THEREX               Nu-Step resistance level 4 with seat and arms at 6- 5 min             FOTO: 52/100             Sit to Stand with 2 x #8 DB 3 x 10              Sit to Stand with #20 in backpack 1 x 10              Curl Up with Reach 3 x 10               NMR              10 m ambulation without use of AD x 10              10 m horizontal head turns x 10                 -min VC to increase step length              10 m  x 4 with use of rollator                -Increased step length and gait speed         PATIENT EDUCATION:  Education details: Stop strengthening exercises especially sit to stands and follow-up with neurologist for further medical eval and treatment. Person educated: Patient Education method: Explanation, Demonstration, Verbal cues, and Handouts Education comprehension: verbalized understanding and returned demonstration     HOME EXERCISE PROGRAM: Access Code: [email or web address] URL: [email or web address] Date: 12/08/2021 Prepared by: Bradly Chris  Exercises - Sit to Stand  - 3 x weekly - 3 sets - 10 reps - Seated Table Hamstring Stretch  - 1 x daily - 3 reps - 30 hold - Walking with Head Rotation  - 1 x daily - 10 reps - Prone Quadriceps Stretch with Strap  - 1 x daily - 7 x weekly - 3 reps - 30 hold - Figure-8 Walking Around Cones  - 1 x daily - 10 reps - Curl Up with Reach  - 1 x daily - 3 x weekly - 3 sets - 10 reps - Seated Shoulder Row with Anchored Resistance  - 1 x daily - 3 x weekly - 3 sets - 10 reps - Romberg Stance with Eyes Closed  - 1 x daily - 5 reps - 30 sec  hold - Mini Squat  - 3 x weekly - 3 sets - 10 reps - Tandem Stance  - 1 x daily - 5 reps - 30 sec  hold  ASSESSMENT:   CLINICAL  IMPRESSION:  Pt continues to show improvement with dynamic balance with ability to ambulate outside over uneven terrain without an AD. He continues to struggle with static balance with his eyes closed and with a narrowed base of support showing severe sway and need to bring out arms to avoid losing his balance. He also has not improved significantly in his LE strength and he still presents at a risk of falling. PT advised that pt can stop using SPC outside and to start this outside  of home down drive way to avoid risk of ambulating in an unfamiliar environment. He will continue to benefit from skilled PT to improve LE strength and static and dynamic balance to decrease his risk of falling to remain independent in completing ADLs such as mowing lawn and grocery shopping.  OBJECTIVE IMPAIRMENTS Abnormal gait, decreased balance, decreased mobility, difficulty walking, decreased strength, hypomobility, impaired flexibility, and pain.    ACTIVITY LIMITATIONS carrying, lifting, bending, sitting, standing, squatting, stairs, and locomotion level   PARTICIPATION LIMITATIONS: cleaning, laundry, shopping, community activity, and yard work   PERSONAL FACTORS Age, Past/current experiences, Time since onset of injury/illness/exacerbation, and 1-2 comorbidities: h/o lumbar surgery and h/o cancer  are also affecting patient's functional outcome.    REHAB POTENTIAL: Fair chronicity of condition and limited response to prior medical interventions    CLINICAL DECISION MAKING: Stable/uncomplicated   EVALUATION COMPLEXITY: Low     GOALS: Goals reviewed with patient? No   SHORT TERM GOALS: Target date: 11/02/2021   Pt will be independent with HEP in order to improve strength and balance in order to decrease fall risk and improve function at home and work. Baseline: NT  Goal status: Ongoing    2.  Patient will be able to perform eccentric portion of sit to stand slowly and under control with little to no  compensation as evidence of improve LE strength.  Baseline: Able to perform sit slowly  Goal status: Achieved          LONG TERM GOALS: Target date: 12/14/2021   Patient will have improved function and activity level as evidenced by an increase in FOTO score by 10 points or more.  Baseline: 56  11/16/21 52 with target of 60 Goal status: Ongoing   2.  Patient will perform 5 x STS in <12 sec to show improved LE strength and to decrease risk of falling.  Baseline: 13 sec  12/08/21: 12.50 sec  Goal status: Partially Achieved    3.  Patient will be able to perform tandem stance with right and left as back leg as evidence for improved static balance and to decrease risk of falling.  Baseline: 4 sec on RLE and 5 sec on LLE  Goal status: Ongoing    4.  Patient will demonstrate reduced falls risk as evidenced by Dynamic Gait Index (DGI) >19/24. Baseline:17/24  Goal status: Ongoing        PLAN: PT FREQUENCY: 1-2x/week   PT DURATION: 8 weeks   PLANNED INTERVENTIONS: Therapeutic exercises, Neuromuscular re-education, Balance training, Gait training, Patient/Family education, Self Care, Joint mobilization, Joint manipulation, Stair training, Vestibular training, DME instructions, Aquatic Therapy, Dry Needling, Spinal manipulation, Spinal mobilization, Moist heat, Manual therapy, and Re-evaluation.   PLAN FOR NEXT SESSION:   DGI. Progress abdominal and hip strength.   Bradly Chris PT, DPT  12/08/2021, 8:20 PM

## 2021-12-13 ENCOUNTER — Ambulatory Visit: Payer: Medicare Other | Admitting: Physical Therapy

## 2021-12-13 ENCOUNTER — Encounter: Payer: Self-pay | Admitting: Physical Therapy

## 2021-12-13 DIAGNOSIS — R2681 Unsteadiness on feet: Secondary | ICD-10-CM

## 2021-12-13 DIAGNOSIS — M5459 Other low back pain: Secondary | ICD-10-CM

## 2021-12-13 NOTE — Therapy (Addendum)
OUTPATIENT PHYSICAL THERAPY TREATMENT NOTE/ Discharge Summary       Patient Name: Wesley Young MRN: [id number] DOB:[birth year], 81 y.o., male Today's Date: 12/13/2021  PCP: Dr. Frazier Richards  REFERRING PROVIDER: Dr. Meade Maw   END OF SESSION:   PT End of Session - 12/13/21 1641     Visit Number 9    Number of Visits 16    Date for PT Re-Evaluation 12/14/21    Authorization Type Medicare    Authorization Time Period 12/14/21    Authorization - Visit Number 9    Authorization - Number of Visits 20    Progress Note Due on Visit 10    PT Start Time 2595    PT Stop Time 1630    PT Time Calculation (min) 45 min    Equipment Utilized During Treatment Gait belt    Activity Tolerance Patient tolerated treatment well    Behavior During Therapy WFL for tasks assessed/performed                 Past Medical History:  Diagnosis Date   Abnormal PSA    s/p post prostate biopsy in the past which was negative   Actinic keratosis 03/14/2007   L ant lat neck at base of neck - bx proven    Actinic keratosis 01/08/2014   R calf - bx proven    Alcoholism ([place])    with prolonged hospitalization 2009 for withdrawal c/b aspiration pneumonia requiring vent   Anxiety    Atrial fibrillation and flutter ([place])    B12 deficiency    Basal cell carcinoma 03/14/2007   R distal lat tricep near elbow    Basal cell carcinoma 03/06/2014   R calf - excision 04/22/2014   Basal cell carcinoma 08/30/2021   suprasternal area, treated with EDC   Cardiomyopathy ([place])    mild probably multifactorial secondary to HTn and possible alcohol contribution. 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   L upper back paraspinal - excision    Hx of squamous cell carcinoma of skin 2014   multiple sites   Hypertension    Impotence    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[place])    Sleep apnea    Spinal stenosis    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   L forearm - excision    Squamous cell carcinoma of skin 12/16/2015   R dorsum hand    Squamous cell carcinoma of skin 10/02/2017   R dorsum hand    Varicose veins of both lower extremities    Past Surgical History:  Procedure Laterality Date   CATARACT EXTRACTION W/PHACO Left 03/12/2019   Procedure: CATARACT EXTRACTION PHACO AND INTRAOCULAR LENS PLACEMENT ([place]) LEFT 5.27 00:38.6;  Surgeon: Birder Robson, MD;  Location: [facility];  Service: Ophthalmology;  Laterality: Left;  sleep apnea   CATARACT EXTRACTION W/PHACO Right 04/09/2019   Procedure: CATARACT EXTRACTION PHACO  AND INTRAOCULAR LENS PLACEMENT ([facility]) RIGHT 4.84 00:52.4;  Surgeon: Birder Robson, MD;  Location: [facility];  Service: Ophthalmology;  Laterality: Right;   colonoscopy with polypectomy  12/2016   COLONOSCOPY WITH PROPOFOL N/A 12/27/2016   Procedure: COLONOSCOPY WITH PROPOFOL;  Surgeon: Toledo, Benay Pike, MD;  Location: [facility] ENDOSCOPY;  Service: Endoscopy;  Laterality: N/A;   COLONOSCOPY WITH PROPOFOL N/A 03/23/2020   Procedure: COLONOSCOPY WITH PROPOFOL;  Surgeon: Toledo, Benay Pike, MD;  Location: [facility] ENDOSCOPY;  Service: Gastroenterology;  Laterality: N/A;   ESOPHAGOGASTRODUODENOSCOPY (EGD) WITH PROPOFOL N/A 09/21/2017   Procedure: ESOPHAGOGASTRODUODENOSCOPY (EGD) WITH PROPOFOL;  Surgeon: Lollie Sails, MD;  Location: [facility] ENDOSCOPY;  Service: Endoscopy;  Laterality: N/A;   ESOPHAGOGASTRODUODENOSCOPY (EGD) WITH PROPOFOL N/A 03/23/2020   Procedure: ESOPHAGOGASTRODUODENOSCOPY (EGD) WITH  PROPOFOL;  Surgeon: Toledo, Benay Pike, MD;  Location: [facility] ENDOSCOPY;  Service: Gastroenterology;  Laterality: N/A;   melonoma removal     prostat biopsy x2     right deltoid resection in 1977     s/p resection of his right deltoid muscle in [place]     There are no problems to display for this patient.   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  He use to run 10 miles a day when he was running and he things this might have been the cause of his low back pain.  Pt reports past spinal surgery two years ago to relieve pressure in spine because of bowel and bladder issues. He has already received a spinal injection 10 years ago, but this did not help much. He states that he is currently on tramadol which does help with the pain. He went off tramadol for a few months because of irregular blood pressure and he experienced a significant increase in his pain and it did not help blood pressure. Has not fallen   PRECAUTIONS: Falls  SUBJECTIVE: Pt reports that he feels that his balance is improved since doing exercises. He has not had any falls since last session.    PAIN:  Are you having pain? No   OBJECTIVE: (objective measures completed at initial evaluation unless otherwise dated)    VITALS: BP 163/ 68 HR  68 SpO2 100   DIAGNOSTIC FINDINGS:   CLINICAL DATA:  Low back pain, spine surgery 2 years ago.   EXAM: MRI LUMBAR SPINE WITHOUT CONTRAST   TECHNIQUE: Multiplanar, multisequence MR imaging of the lumbar spine was performed. No intravenous contrast was administered.   COMPARISON:  10/05/2021   FINDINGS: Segmentation:  Standard.   Alignment: Grade 1 anterolisthesis of L3 on L4 and L4 on  L5 secondary to facet disease.   Vertebrae: No acute fracture, evidence of discitis, or aggressive bone lesion.   Conus medullaris and cauda equina: Conus extends to the L1 level. Conus and cauda equina appear normal.   Paraspinal and other soft tissues: No acute paraspinal abnormality.   Other: Partial ankylosis of the SI joints bilaterally.   Disc levels:   Disc spaces: Interbody fusion at L2-3 and L5-S1. Degenerative disease with disc height loss at L1-2, L3-4 and L4-5.   T12-L1: No significant disc bulge. No neural foraminal stenosis. No central canal stenosis. Mild bilateral facet arthropathy.  L1-L2: Broad-based disc osteophyte complex. Moderate bilateral facet arthropathy. Moderate spinal stenosis. Mild bilateral foraminal stenosis.   L2-L3: Posterior osseous ridging and interbody fusion. Moderate bilateral facet arthropathy. Mild spinal stenosis. Mild left foraminal stenosis. No right foraminal stenosis severe is a   L3-L4: Broad-based disc osteophyte complex. Severe bilateral facet arthropathy. Moderate-severe spinal stenosis. Mild bilateral foraminal stenosis.   L4-L5: Broad-based disc bulge. Severe bilateral facet arthropathy. Severe spinal stenosis. No foraminal stenosis.   L5-S1: Interbody fusion and posterior osseous ridging. Mild bilateral facet arthropathy. No foraminal or central canal stenosis.   IMPRESSION: 1. At L1-2 there is a broad-based disc osteophyte complex. Moderate bilateral facet arthropathy. Moderate spinal stenosis. Mild bilateral foraminal stenosis. 2. At L2-3 there is posterior osseous ridging and interbody fusion. Moderate bilateral facet arthropathy. Mild spinal stenosis. Mild left foraminal stenosis. 3. At L3-4 there is a broad-based disc osteophyte complex. Severe bilateral facet arthropathy. Moderate-severe spinal stenosis. Mild bilateral foraminal stenosis. 4. At L4-5 there is a broad-based disc bulge. Severe bilateral  facet arthropathy. Severe spinal stenosis. 5. No acute osseous injury of the lumbar spine. 6.  No acute osseous injury of the lumbar spine.     Electronically Signed   By: Kathreen Devoid M.D.   On: 11/18/2021 13:42   PATIENT SURVEYS:  FOTO 56/100 with target of 60    SCREENING FOR RED FLAGS: Bowel or bladder incontinence: No Spinal tumors: No Cauda equina syndrome: No Compression fracture: No Abdominal aneurysm: No   COGNITION:           Overall cognitive status: Within functional limits for tasks assessed                          SENSATION: WFLfeels some numbness and tingling in toes    MUSCLE LENGTH: Hamstrings: NT  Thomas test: NT    POSTURE: No Significant postural limitations   PALPATION: L1-L5 central spinous processes    LUMBAR ROM:    Active  A/PROM  eval  Flexion 100%  Extension 50%  Right lateral flexion 75%  Left lateral flexion 75%  Right rotation 75%  Left rotation 75%   (Blank rows = not tested)   LOWER EXTREMITY ROM:   WFL         LOWER EXTREMITY MMT:       Active  Right eval Left eval  Hip flexion 4 4-  Hip extension      Hip abduction      Hip adduction      Hip internal rotation      Hip external rotation      Knee flexion 4+ 4+  Knee extension 4+ 4+  Ankle dorsiflexion 5 5  Ankle plantarflexion      Ankle inversion      Ankle eversion       (Blank rows = not tested)    (Blank rows = not tested)   LUMBAR SPECIAL TESTS: Not performed    FUNCTIONAL TESTS:  5 times sit to stand: 13 sec  <12 sec is a risk for falls in the elderly   30 seconds chair stand test: 12 <10 for men ages 83-84   STEADI 4-Stage Balance Test: (inability to maintain tandem stance for 10 sec = increased risk for falls) - feet together, eyes open, noncompliant surface: 10/10 sec - semi-tandem stance, eyes open, noncompliant surface: 10/10 sec - tandem stances, eyes open, noncompliant surface: 4/5 sec - single leg stance: Not tested  DGI:  18/24  17/24 (11/16/21) -Minor Impairments: Head turns, turn and stop, obstacles, stairs, walking straight, fast and slow   19/24 (12/13/21)  -Minor Impairments: head turns, turn and stop, obstacles, stairs, cone waves        GAIT: Distance walked: 40 ft  Assistive device utilized: Single point cane Level of assistance: Modified independence Comments: Wide based gait with decreased step length        TODAY'S TREATMENT   12/13/21:  THEREX  DGI: 19/24 with no use of AD FOTO: 61/100   Reviewed goals along with description about progress towards goals and elicited patient feedback for shared decision making.   Reviewed HEP with progressions for his exercises: -Included standing marches because stepping over obstacles would be too dangerous at home   Standing Marches with two finger support 3 x 10  -mod VC for increased knee flexion   Semi-tandem with head turns 2 x 10  -Ankle strategy with intermittent UE support    12/08/21  THEREX  1,000 ft walk without use of cane over grass and up and down hills (back parking lot) 5xSTS: 12.5 sec  Mini-Squat 1 x 10 -Lack of eccentric control  Mini-Squat with #8 DB held out in front 2 x 10     NMR  Romberg with eyes closed arms out by side to 30 deg abduction -30 sec   Romberg with eyes closed arms by side 30 sec   Romberg with eyes closed and with feet slightly apart 2 x 30 sec   Tandem 3 x 30 sec    -Needs to end arms out to side to be able to hold pose with RLE back               12/06/21            THEREX              Nu-Step resistance level 4 with seat and arms at 8- 5 min            Seated Abdominal Crunches with arms crossed 3 x 10             Quadruped Hip Extension 3 x 10             NMR                       10 meter x 6 pt walking without AD            10 meter x 6 with horizontal head turns            10 meter x 6 with vertical head turns            10 meter x 8 with 3-1 ft obstacles            10 meter  8 cone weaves   x 8            Half Tandem with horizontal head turns 2 x 10             -Moderate ankle sway with left foot back            Half Tandem with vertical head turns 2 x 10            Half tandem with eyes closed 2 x 30 sec              -Pt is unable to hold position  Romberg with eyes closed 2 x 30 sec        PATIENT EDUCATION:  Education details: Stop strengthening exercises especially sit to stands and follow-up with neurologist for further medical eval and treatment. Person educated: Patient Education method: Explanation, Demonstration, Verbal cues, and Handouts Education comprehension: verbalized understanding and returned demonstration     HOME EXERCISE PROGRAM: Access Code: [email or web address] URL: [email or web address] Date: 12/13/2021 Prepared by: Bradly Chris  Exercises - Seated Table Hamstring Stretch  - 1 x daily - 3 reps - 30 hold - Prone Quadriceps Stretch with Strap  - 1 x daily - 7 x weekly - 3 reps - 30 hold - Sit to Stand  - 3 x weekly - 3 sets - 10 reps - Walking with Head Rotation  - 1 x daily - 10 reps - Figure-8 Walking Around [place]  - 1 x daily - 10 reps - Curl Up with Reach  - 1 x daily - 3 x weekly - 3 sets - 10 reps - Seated Shoulder Row with Anchored Resistance  - 1 x daily - 3 x weekly - 3 sets - 10 reps - Romberg Stance with Eyes Closed  - 1 x daily - 5 reps - 30 sec  hold - Mini Squat  - 3 x weekly - 3 sets - 10 reps - Standing Marching  - 3 x weekly - 3 sets - 10 reps - Bent Over Single Arm Shoulder Row with Dumbbell  - 3 x weekly - 3 sets - 10 reps - Half Tandem Stance Balance with Head Rotation  - 1 x daily - 2 sets - 10 reps  ASSESSMENT:   CLINICAL IMPRESSION: Pt shows improvement in dynamic balance and self perception of function. PT recommends that pt could improve more but not significantly with ongoing PT, but that he could likely maintain functional gain with ongoing HEP compliance. Pt wants to pursue HEP  independently to continue to maintain functional gains and to maintain improved balance. PT discussed regressions and progressions for each exercise along with adding additional exercises. Pt has met the majority of his goals and he is ready for discharge with robust HEP.    OBJECTIVE IMPAIRMENTS Abnormal gait, decreased balance, decreased mobility, difficulty walking, decreased strength, hypomobility, impaired flexibility, and pain.    ACTIVITY LIMITATIONS carrying, lifting, bending, sitting, standing, squatting, stairs, and locomotion level   PARTICIPATION LIMITATIONS: cleaning, laundry, shopping, community activity, and yard work   PERSONAL FACTORS Age, Past/current experiences, Time since onset of injury/illness/exacerbation, and 1-2 comorbidities: h/o lumbar surgery and h/o cancer  are also affecting patient's functional outcome.    REHAB POTENTIAL: Fair chronicity of condition and limited response to prior medical interventions    CLINICAL DECISION MAKING: Stable/uncomplicated   EVALUATION COMPLEXITY: Low     GOALS: Goals reviewed with patient? No   SHORT TERM GOALS: Target date: 11/02/2021   Pt will be independent with HEP in order to improve strength and balance in order to decrease fall risk and improve function at home and work. Baseline: NT 12/13/21: Performing exercises independently  Goal status: Achieved    2.  Patient will be able to perform eccentric portion of sit to stand slowly and under control with little to no compensation as evidence of improve LE strength.  Baseline: Able to perform sit slowly  Goal status: Achieved          LONG TERM GOALS: Target date: 12/14/2021   Patient will have improved function and activity level as evidenced  by an increase in FOTO score by 10 points or more.  Baseline: 56  11/16/21 52 with target of 60  12/13/21: 61/100  Goal status: Achieved   2.  Patient will perform 5 x STS in <12 sec to show improved LE strength and to  decrease risk of falling.  Baseline: 13 sec  12/08/21: 12.50 sec 12/13/21: 11.5 sec  Goal status: Achieved    3.  Patient will be able to perform tandem stance with right and left as back leg as evidence for improved static balance and to decrease risk of falling.  Baseline: 4 sec on RLE and 5 sec on LLE  Goal status: Deferred    4.  Patient will demonstrate reduced falls risk as evidenced by Dynamic Gait Index (DGI) >19/24. Baseline:17/24   12/13/21: 19/24 Goal status: Partially Met        PLAN: PT FREQUENCY: 1-2x/week   PT DURATION: 8 weeks   PLANNED INTERVENTIONS: Therapeutic exercises, Neuromuscular re-education, Balance training, Gait training, Patient/Family education, Self Care, Joint mobilization, Joint manipulation, Stair training, Vestibular training, DME instructions, Aquatic Therapy, Dry Needling, Spinal manipulation, Spinal mobilization, Moist heat, Manual therapy, and Re-evaluation.   PLAN FOR NEXT SESSION:   DGI. Progress abdominal and hip strength.   Bradly Chris PT, DPT  12/13/2021, 4:44 PM

## 2021-12-15 ENCOUNTER — Encounter: Payer: Medicare Other | Admitting: Physical Therapy

## 2021-12-20 ENCOUNTER — Encounter: Payer: Medicare Other | Admitting: Physical Therapy

## 2021-12-23 ENCOUNTER — Encounter: Payer: Medicare Other | Admitting: Physical Therapy

## 2021-12-28 ENCOUNTER — Encounter: Payer: Medicare Other | Admitting: Physical Therapy

## 2021-12-30 ENCOUNTER — Encounter: Payer: Medicare Other | Admitting: Physical Therapy

## 2022-01-04 ENCOUNTER — Encounter: Payer: Medicare Other | Admitting: Physical Therapy

## 2022-01-06 ENCOUNTER — Encounter: Payer: Medicare Other | Admitting: Physical Therapy

## 2022-01-25 ENCOUNTER — Encounter: Payer: Self-pay | Admitting: Neurosurgery

## 2022-01-25 ENCOUNTER — Ambulatory Visit (INDEPENDENT_AMBULATORY_CARE_PROVIDER_SITE_OTHER): Payer: Medicare Other | Admitting: Neurosurgery

## 2022-01-25 VITALS — BP 121/59 | HR 67 | Ht 72.0 in | Wt 178.6 lb

## 2022-01-25 DIAGNOSIS — M48062 Spinal stenosis, lumbar region with neurogenic claudication: Secondary | ICD-10-CM

## 2022-01-25 NOTE — Progress Notes (Signed)
Referring Physician:  Kirk Ruths, MD [facility] [place],  [place] [address]  Primary Physician:  Kirk Ruths, MD  History of Present Illness: 01/25/2022 Wesley Young is doing much better.  He is more mobile.  He is cutting the grass every other day.  11/23/2021 Wesley Young presents back for reevaluation.  He did visit the emergency department when he had some concern for fecal incontinence.  He is currently stable.  He has been doing physical therapy.  10/07/21 Wesley Young is here today with a chief complaint of low back pain with weakness in the left leg.  He had an event on August 13 when he had an electrical sensation down his leg with weakness and numbness.  He has gotten better since that time, but still has discomfort when he walks or stands.  Sitting down makes his pain better.  He currently has 2 out of 10 pain.  He has no bowel or bladder dysfunction currently.   Bowel/Bladder Dysfunction: none  Conservative measures:  Physical therapy:  has not participated in Multimodal medical therapy including regular antiinflammatories: tramadol Injections: has had epidural steroid injections in 2013 but none for this problem.   Past Surgery:  08/23/19: L4 laminectomy by Dr. Ignacia Bayley has no symptoms of cervical myelopathy.  The symptoms are causing a significant impact on the patient's life.   Review of Systems:  A 10 point review of systems is negative, except for the pertinent positives and negatives detailed in the HPI.  Past Medical History: Past Medical History:  Diagnosis Date   Abnormal PSA    s/p post prostate biopsy in the past which was negative   Actinic keratosis 03/14/2007   L ant lat neck at base of neck - bx proven    Actinic keratosis 01/08/2014   R calf - bx proven    Alcoholism ([place])    with prolonged hospitalization 2009 for withdrawal c/b aspiration pneumonia requiring vent   Anxiety     Atrial fibrillation and flutter ([place])    B12 deficiency    Basal cell carcinoma 03/14/2007   R distal lat tricep near elbow    Basal cell carcinoma 03/06/2014   R calf - excision 04/22/2014   Basal cell carcinoma 08/30/2021   suprasternal area, treated with EDC   Cardiomyopathy ([place])    mild probably multifactorial secondary to HTn and possible alcohol contribution. 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   L upper back paraspinal - excision    Hx of squamous cell carcinoma of skin 2014   multiple sites   Hypertension    Impotence    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[place])    Sleep apnea    Spinal stenosis    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   L forearm - excision    Squamous cell carcinoma  of skin 12/16/2015   R dorsum hand    Squamous cell carcinoma of skin 10/02/2017   R dorsum hand    Varicose veins of both lower extremities     Past Surgical History: Past Surgical History:  Procedure Laterality Date   CATARACT EXTRACTION W/PHACO Left 03/12/2019   Procedure: CATARACT EXTRACTION PHACO AND INTRAOCULAR LENS PLACEMENT (IOC) LEFT 5.27 00:38.6;  Surgeon: Birder Robson, MD;  Location: [place];  Service: Ophthalmology;  Laterality: Left;  sleep apnea   CATARACT EXTRACTION W/PHACO Right 04/09/2019   Procedure: CATARACT EXTRACTION PHACO AND INTRAOCULAR LENS PLACEMENT (IOC) RIGHT 4.84 00:52.4;  Surgeon: Birder Robson, MD;  Location: [place];  Service: Ophthalmology;  Laterality: Right;   colonoscopy  with polypectomy  12/2016   COLONOSCOPY WITH PROPOFOL N/A 12/27/2016   Procedure: COLONOSCOPY WITH PROPOFOL;  Surgeon: Toledo, Benay Pike, MD;  Location: [facility] ENDOSCOPY;  Service: Endoscopy;  Laterality: N/A;   COLONOSCOPY WITH PROPOFOL N/A 03/23/2020   Procedure: COLONOSCOPY WITH PROPOFOL;  Surgeon: Toledo, Benay Pike, MD;  Location: [facility] ENDOSCOPY;  Service: Gastroenterology;  Laterality: N/A;   ESOPHAGOGASTRODUODENOSCOPY (EGD) WITH PROPOFOL N/A 09/21/2017   Procedure: ESOPHAGOGASTRODUODENOSCOPY (EGD) WITH PROPOFOL;  Surgeon: Lollie Sails, MD;  Location: [facility] ENDOSCOPY;  Service: Endoscopy;  Laterality: N/A;   ESOPHAGOGASTRODUODENOSCOPY (EGD) WITH PROPOFOL N/A 03/23/2020   Procedure: ESOPHAGOGASTRODUODENOSCOPY (EGD) WITH PROPOFOL;  Surgeon: Toledo, Benay Pike, MD;  Location: [facility] ENDOSCOPY;  Service: Gastroenterology;  Laterality: N/A;   melonoma removal     prostat biopsy x2     right deltoid resection in 1977     s/p resection of his right deltoid muscle in [place]      Allergies: Allergies as of 01/25/2022 - Review Complete 01/25/2022  Allergen Reaction Noted   Ativan [lorazepam]  03/20/2020   Benadryl [diphenhydramine]  08/07/2019   Crestor [rosuvastatin calcium]  09/20/2017   Nsaids  09/20/2017   Peanut-containing drug products Swelling 08/21/2019   Rapaflo [silodosin]  09/20/2017   Statins Other (See Comments) 12/27/2016   Zetia [ezetimibe]  09/20/2017   Latex Rash 12/27/2016   Neosporin [neomycin-bacitracin zn-polymyx] Rash 12/27/2016    Medications: Current Meds  Medication Sig   cyanocobalamin (VITAMIN B12) 1000 MCG/ML injection Inject 1,000 mcg into the muscle every 30 (thirty) days.    Social History: Social History   Tobacco Use   Smoking status: Former    Types: Cigarettes    Quit date: 1977    Years since quitting: 46.9   Smokeless tobacco: Never  Vaping Use   Vaping Use: Never used  Substance Use Topics    Alcohol use: Not Currently    Comment: quit 03/26/2008   Drug use: Not Currently    Family Medical History: No family history on file.  Physical Examination: Vitals:   01/25/22 1439  BP: (!) 121/59  Pulse: 67     General: Patient is well developed, well nourished, calm, collected, and in no apparent distress. Attention to examination is appropriate.  Neck:   Supple.  Full range of motion.  Respiratory: Patient is breathing without any difficulty.   NEUROLOGICAL:     Awake, alert, oriented to person, place, and time.  Speech is clear and fluent. Fund of knowledge is appropriate.   Cranial Nerves: Pupils equal round and reactive to light.  Facial tone is symmetric.  Facial sensation is symmetric. Shoulder shrug is symmetric. Tongue protrusion is midline.  There is no pronator  drift.  Strength: Side Biceps Triceps Deltoid Interossei Grip Wrist Ext. Wrist Flex.  R '5 5 5 5 5 5 5  '$ L '5 5 5 5 5 5 5   '$ Side Iliopsoas Quads Hamstring PF DF EHL  R '5 5 5 5 5 5  '$ L '5 5 5 5 5 5   '$ Reflexes are 1+ and symmetric at the biceps, triceps, brachioradialis, patella and achilles.   Hoffman's is absent.  Clonus is not present.  Toes are down-going.  Bilateral upper and lower extremity sensation is intact to light touch.    No evidence of dysmetria noted.  Gait is normal and requires a cane.     Medical Decision Making  Imaging: MRI L spine 10/05/21 1. Severe degenerative findings with severe spinal canal stenosis at L3-L4 and L4-L5, which raises concern for cauda equina syndrome, although this appears unchanged compared to 11/11/2019. In addition there is mild bilateral neural foraminal narrowing at these levels. 2. L1-L2 moderate spinal canal stenosis and mild-to-moderate bilateral neural foraminal narrowing.     Electronically Signed   By: Merilyn Baba M.D.   On: 10/05/2021 20:46  I have personally reviewed the images and agree with the above interpretation.  Assessment and  Plan: Wesley Young is a pleasant 81 y.o. 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  His symptoms have improved with physical therapy.  He is walking with a cane.  He does not currently have cauda equina syndrome.  He will continue exercises for now.  I offered to see him back in follow-up, but he will call me back if and when he has worsening symptoms.  He does have some intermittent right shoulder pain.  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  If his symptoms continue, I advised him to see Dr. Rudene Christians again for reevaluation.  I think it is unlikely he has a recurrence of cancer given that he has been cancer free for 47 years.  I spent a total of 10 minutes in face-to-face and non-face-to-face activities related to this patient's care today.  Thank you for involving me in the care of this patient.      Tanicka Bisaillon K. Izora Ribas MD, [facility] Neurosurgery

## 2022-05-19 ENCOUNTER — Other Ambulatory Visit (HOSPITAL_COMMUNITY): Payer: Self-pay | Admitting: Student

## 2022-05-19 DIAGNOSIS — R413 Other amnesia: Secondary | ICD-10-CM

## 2022-05-23 ENCOUNTER — Ambulatory Visit (HOSPITAL_COMMUNITY): Payer: Medicare Other

## 2022-05-25 ENCOUNTER — Ambulatory Visit (HOSPITAL_COMMUNITY)
Admission: RE | Admit: 2022-05-25 | Discharge: 2022-05-25 | Disposition: A | Discharge status: Home / Self Care | Payer: Medicare Other | Source: Ambulatory Visit | Attending: Student | Admitting: Student

## 2022-05-25 DIAGNOSIS — R413 Other amnesia: Secondary | ICD-10-CM | POA: Diagnosis present

## 2022-08-02 ENCOUNTER — Other Ambulatory Visit: Payer: Self-pay

## 2022-08-02 ENCOUNTER — Emergency Department: Payer: Medicare Other

## 2022-08-02 ENCOUNTER — Observation Stay
Admission: EM | Admit: 2022-08-02 | Discharge: 2022-08-05 | Disposition: A | Discharge status: Home / Self Care | Payer: Medicare Other | Attending: Internal Medicine | Admitting: Internal Medicine

## 2022-08-02 ENCOUNTER — Encounter: Payer: Self-pay | Admitting: Intensive Care

## 2022-08-02 DIAGNOSIS — Z91040 Latex allergy status: Secondary | ICD-10-CM | POA: Insufficient documentation

## 2022-08-02 DIAGNOSIS — J984 Other disorders of lung: Secondary | ICD-10-CM | POA: Diagnosis not present

## 2022-08-02 DIAGNOSIS — I509 Heart failure, unspecified: Secondary | ICD-10-CM | POA: Insufficient documentation

## 2022-08-02 DIAGNOSIS — K5730 Diverticulosis of large intestine without perforation or abscess without bleeding: Secondary | ICD-10-CM | POA: Diagnosis not present

## 2022-08-02 DIAGNOSIS — I4891 Unspecified atrial fibrillation: Secondary | ICD-10-CM | POA: Diagnosis not present

## 2022-08-02 DIAGNOSIS — C3492 Malignant neoplasm of unspecified part of left bronchus or lung: Secondary | ICD-10-CM | POA: Insufficient documentation

## 2022-08-02 DIAGNOSIS — R042 Hemoptysis: Principal | ICD-10-CM | POA: Diagnosis present

## 2022-08-02 DIAGNOSIS — Z87891 Personal history of nicotine dependence: Secondary | ICD-10-CM | POA: Diagnosis not present

## 2022-08-02 DIAGNOSIS — G4733 Obstructive sleep apnea (adult) (pediatric): Secondary | ICD-10-CM

## 2022-08-02 DIAGNOSIS — G8929 Other chronic pain: Secondary | ICD-10-CM | POA: Diagnosis present

## 2022-08-02 DIAGNOSIS — Z85828 Personal history of other malignant neoplasm of skin: Secondary | ICD-10-CM | POA: Diagnosis not present

## 2022-08-02 DIAGNOSIS — I10 Essential (primary) hypertension: Secondary | ICD-10-CM | POA: Diagnosis present

## 2022-08-02 DIAGNOSIS — D491 Neoplasm of unspecified behavior of respiratory system: Secondary | ICD-10-CM

## 2022-08-02 DIAGNOSIS — Z79899 Other long term (current) drug therapy: Secondary | ICD-10-CM | POA: Insufficient documentation

## 2022-08-02 DIAGNOSIS — I110 Hypertensive heart disease with heart failure: Secondary | ICD-10-CM | POA: Insufficient documentation

## 2022-08-02 DIAGNOSIS — F418 Other specified anxiety disorders: Secondary | ICD-10-CM | POA: Diagnosis present

## 2022-08-02 DIAGNOSIS — D649 Anemia, unspecified: Secondary | ICD-10-CM | POA: Diagnosis not present

## 2022-08-02 DIAGNOSIS — C3490 Malignant neoplasm of unspecified part of unspecified bronchus or lung: Secondary | ICD-10-CM | POA: Diagnosis present

## 2022-08-02 LAB — CBC WITH DIFFERENTIAL/PLATELET
Abs Immature Granulocytes: 0.04 10*3/uL (ref 0.00–0.07)
Basophils Absolute: 0.1 10*3/uL (ref 0.0–0.1)
Basophils Relative: 1 %
Eosinophils Absolute: 0.2 10*3/uL (ref 0.0–0.5)
Eosinophils Relative: 2 %
HCT: 37.8 % — ABNORMAL LOW (ref 39.0–52.0)
Hemoglobin: 12.5 g/dL — ABNORMAL LOW (ref 13.0–17.0)
Immature Granulocytes: 0 %
Lymphocytes Relative: 7 %
Lymphs Abs: 0.7 10*3/uL (ref 0.7–4.0)
MCH: 30.6 pg (ref 26.0–34.0)
MCHC: 33.1 g/dL (ref 30.0–36.0)
MCV: 92.4 fL (ref 80.0–100.0)
Monocytes Absolute: 0.7 10*3/uL (ref 0.1–1.0)
Monocytes Relative: 7 %
Neutro Abs: 8 10*3/uL — ABNORMAL HIGH (ref 1.7–7.7)
Neutrophils Relative %: 83 %
Platelets: 275 10*3/uL (ref 150–400)
RBC: 4.09 MIL/uL — ABNORMAL LOW (ref 4.22–5.81)
RDW: 12.6 % (ref 11.5–15.5)
WBC: 9.7 10*3/uL (ref 4.0–10.5)
nRBC: 0 % (ref 0.0–0.2)

## 2022-08-02 LAB — COMPREHENSIVE METABOLIC PANEL
ALT: 18 U/L (ref 0–44)
AST: 17 U/L (ref 15–41)
Albumin: 3.9 g/dL (ref 3.5–5.0)
Alkaline Phosphatase: 116 U/L (ref 38–126)
Anion gap: 7 (ref 5–15)
BUN: 17 mg/dL (ref 8–23)
CO2: 25 mmol/L (ref 22–32)
Calcium: 9.8 mg/dL (ref 8.9–10.3)
Chloride: 103 mmol/L (ref 98–111)
Creatinine, Ser: 0.65 mg/dL (ref 0.61–1.24)
GFR, Estimated: >60 mL/min (ref >60)
Glucose, Bld: 125 mg/dL — ABNORMAL HIGH (ref 70–99)
Potassium: 4.6 mmol/L (ref 3.5–5.1)
Sodium: 135 mmol/L (ref 135–145)
Total Bilirubin: 0.5 mg/dL (ref 0.3–1.2)
Total Protein: 6.6 g/dL (ref 6.5–8.1)

## 2022-08-02 MED ORDER — HEPARIN SODIUM (PORCINE) 5000 UNIT/ML IJ SOLN
5000.0000 [IU] | Freq: Three times a day (TID) | INTRAMUSCULAR | Status: DC
  Administered 2022-08-02 – 2022-08-03 (×2): 5000 [IU] via SUBCUTANEOUS
  Filled 2022-08-02 (×2): qty 1

## 2022-08-02 MED ORDER — HYDRALAZINE HCL 20 MG/ML IJ SOLN: 5.0000 mg | Freq: Four times a day (QID) | INTRAMUSCULAR | Status: DC | PRN

## 2022-08-02 MED ORDER — MELATONIN 5 MG PO TABS: 5.0000 mg | ORAL_TABLET | Freq: Every evening | ORAL | Status: DC | PRN

## 2022-08-02 MED ORDER — ONDANSETRON HCL 4 MG/2ML IJ SOLN: 4.0000 mg | Freq: Four times a day (QID) | INTRAMUSCULAR | Status: DC | PRN

## 2022-08-02 MED ORDER — DONEPEZIL HCL 5 MG PO TABS
5.0000 mg | ORAL_TABLET | Freq: Every morning | ORAL | Status: DC
  Administered 2022-08-03 – 2022-08-04 (×2): 5 mg via ORAL
  Filled 2022-08-02 (×2): qty 1

## 2022-08-02 MED ORDER — ONDANSETRON HCL 4 MG PO TABS: 4.0000 mg | ORAL_TABLET | Freq: Four times a day (QID) | ORAL | Status: DC | PRN

## 2022-08-02 MED ORDER — SENNOSIDES-DOCUSATE SODIUM 8.6-50 MG PO TABS: 1.0000 | ORAL_TABLET | Freq: Every evening | ORAL | Status: DC | PRN

## 2022-08-02 MED ORDER — IOHEXOL 300 MG/ML  SOLN
75.0000 mL | Freq: Once | INTRAMUSCULAR | Status: AC | PRN
  Administered 2022-08-02: 75 mL via INTRAVENOUS

## 2022-08-02 MED ORDER — ACETAMINOPHEN 650 MG RE SUPP: 650.0000 mg | Freq: Four times a day (QID) | RECTAL | Status: DC | PRN

## 2022-08-02 MED ORDER — LOSARTAN POTASSIUM 50 MG PO TABS
50.0000 mg | ORAL_TABLET | Freq: Every day | ORAL | Status: DC
  Administered 2022-08-03 – 2022-08-05 (×2): 50 mg via ORAL
  Filled 2022-08-02 (×2): qty 1

## 2022-08-02 MED ORDER — PRAMIPEXOLE DIHYDROCHLORIDE 0.25 MG PO TABS
0.5000 mg | ORAL_TABLET | Freq: Every day | ORAL | Status: DC
  Administered 2022-08-02 – 2022-08-04 (×3): 0.5 mg via ORAL
  Filled 2022-08-02 (×4): qty 2

## 2022-08-02 MED ORDER — FINASTERIDE 5 MG PO TABS
5.0000 mg | ORAL_TABLET | Freq: Every day | ORAL | Status: DC
  Administered 2022-08-03 – 2022-08-05 (×3): 5 mg via ORAL
  Filled 2022-08-02 (×3): qty 1

## 2022-08-02 MED ORDER — IOHEXOL 300 MG/ML  SOLN
80.0000 mL | Freq: Once | INTRAMUSCULAR | Status: AC | PRN
  Administered 2022-08-02: 80 mL via INTRAVENOUS

## 2022-08-02 MED ORDER — TRAMADOL HCL 50 MG PO TABS
50.0000 mg | ORAL_TABLET | Freq: Four times a day (QID) | ORAL | Status: DC | PRN
  Administered 2022-08-02 – 2022-08-05 (×9): 50 mg via ORAL
  Filled 2022-08-02 (×9): qty 1

## 2022-08-02 MED ORDER — SODIUM CHLORIDE 0.9 % IV BOLUS
1000.0000 mL | Freq: Once | INTRAVENOUS | Status: AC
  Administered 2022-08-02: 1000 mL via INTRAVENOUS

## 2022-08-02 MED ORDER — ACETAMINOPHEN 325 MG PO TABS
650.0000 mg | ORAL_TABLET | Freq: Four times a day (QID) | ORAL | Status: DC | PRN
  Filled 2022-08-02: qty 2

## 2022-08-02 MED ORDER — TAMSULOSIN HCL 0.4 MG PO CAPS
0.4000 mg | ORAL_CAPSULE | Freq: Every day | ORAL | Status: DC
  Administered 2022-08-03 – 2022-08-05 (×3): 0.4 mg via ORAL
  Filled 2022-08-02 (×3): qty 1

## 2022-08-02 NOTE — Progress Notes (Signed)
Patient is alert and oriented x 4. Admitted for hemoptysis. Last occurrence was this morning.  CT of the chest with contrast: Was read as 7.1 x 6.0 x 4.4 cm partially necrotic central left lung mass involving the inferior aspect of the left upper lobe and superior aspect of the left lower lobe.  Findings consistent with primary neoplasm. Oncology and thoracic surgery is consulted. Possible biopsy tomorrow. Patient will be NPO after midnight. Place on tele monitor. Vital sign with with normal limits. Need assistance to the bathroom.Fall Precaution initiate.

## 2022-08-02 NOTE — H&P (Addendum)
History and Physical   Wesley Young ZOX:[id number] DOB: 06/14/1940 DOA: 08/02/2022  PCP: Lauro Regulus, MD  Patient coming from: Home  I have personally briefly reviewed patient's old medical records in [facility] EMR.  Chief Concern: Hemoptysis  HPI: Wesley Young is an 82 year old male with history of hypertension, anxiety, depression, history of alcohol use, history of fibrosarcoma in 1977, and history of melanoma s/p removal (2022), who presents to the emergency department for chief concerns of coughing up blood  Vitals in the ED showed temperature of 98.1, respiration rate of 18, heart rate of 62, blood pressure 140/95, SpO2 of 99% on room air.  Serum sodium is 135, potassium 4.6, chloride 103, bicarb 25, BUN of 17, serum creatinine of 0.65, EGFR greater than 60, nonfasting blood glucose 125, WBC 9.7, hemoglobin 12.5, platelets of 275.  CT of the chest with contrast: Was read as 7.1 x 6.0 x 4.4 cm partially necrotic central left lung mass involving the inferior aspect of the left upper lobe and superior aspect of the left lower lobe.  Findings consistent with primary neoplasm.  Aortic atherosclerosis.  No worrisome pulmonary nodules to suggest pulmonary metastatic disease.  ED treatment: None -------------------------------- At bedside, he is able to tell me he his name, age, current year, current locatin.  He reports that over the last 6 months, he has lost approximately 10 pounds. He further reports that since October 2023, he and his wife have not been getting good sleep due to recent unintented financial diaster and his pcp has put him on a vegetable only diet as well.   Social history: He lives at home with his wife. He denies current tobacco use, he quit in 1977. He denies etoh and recreational drug use. He is retired and formerly was a Theatre stage manager.   ROS: Constitutional: + weight change, no fever ENT/Mouth: no sore throat, no rhinorrhea Eyes: no eye pain,  no vision changes Cardiovascular: no chest pain, no dyspnea,  no edema, no palpitations Respiratory: + cough, + sputum, no wheezing, + hemoptysis Gastrointestinal: no nausea, no vomiting, no diarrhea, no constipation Genitourinary: no urinary incontinence, no dysuria, no hematuria Musculoskeletal: no arthralgias, no myalgias Skin: no skin lesions, no pruritus, Neuro: no weakness, no loss of consciousness, no syncope Psych: no anxiety, no depression, no decrease appetite Heme/Lymph: no bruising, no bleeding  ED Course: Discussed with EDP, patient requiring hospitalization for chief concern of new primary neoplasm.  Assessment/Plan  Principal Problem:   Hemoptysis Active Problems:   Pulmonary neoplasm   Essential hypertension   Depression with anxiety   OSA on CPAP   Chronic pain   Mild anemia   Assessment and Plan:  * Hemoptysis Secondary to primary pulmonary neoplasm With mild decrease in hgb CBC in the AM  Pulmonary neoplasm Left necrotic lung mass, inferior and superior aspect EDP has consulted oncology, Dr. Cathie Hoops who recommends hospitalist admission for further workup CT of the abdomen and pelvis with contrast ordered at the recommendation of Dr. Cathie Hoops Pulmonology service, Dr. Karna Christmas has been consulted for consideration of biopsy via bronchoscopy Keep n.p.o. pending pulmonology evaluation Telemetry medical, observation  Mild anemia Suspect secondary to hemoptysis in setting of new pulmonary neoplasm diagnosis, continue monitoring Repeat CBC in a.m.  Chronic pain Resumed home tramadol 50 mg every 6 hours as needed for moderate pain  OSA on CPAP CPAP nightly ordered  Essential hypertension Resumed home losartan 50 mg daily Hydralazine 5 mg IV every 6 hours as needed for  SBP greater 175, 4 days ordered  Neuropathy with restless leg-resumed home pramipexole 0.5 mg nightly  Chart reviewed.   DVT prophylaxis: Heparin 5000 units subcutaneous every 8 hours starting  2200 on day of admission Code Status: full code  Diet: heart healthy Family Communication: updated spouse at bedside Disposition Plan: Pending the echo oncology and pulmonary evaluation Consults called: Medical oncology, pulmonology Admission status: Telemetry medical, observation  Past Medical History:  Diagnosis Date   Abnormal PSA    s/p post prostate biopsy in the past which was negative   Actinic keratosis 03/14/2007   L ant lat neck at base of neck - bx proven    Actinic keratosis 01/08/2014   R calf - bx proven    Alcoholism (HCC)    with prolonged hospitalization 2009 for withdrawal c/b aspiration pneumonia requiring vent   Anxiety    Atrial fibrillation and flutter (HCC)    B12 deficiency    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   L upper back paraspinal - excision    Hx of squamous cell carcinoma of skin 2014   multiple sites   Hypertension    Impotence    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HCC)    Sleep apnea    Spinal stenosis    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   L forearm - excision    Squamous cell carcinoma of skin 12/16/2015   R dorsum hand    Squamous cell carcinoma of skin 10/02/2017   R  dorsum hand    Varicose veins of both lower extremities    Past Surgical History:  Procedure Laterality Date   CATARACT EXTRACTION W/PHACO Left 03/12/2019   Procedure: CATARACT EXTRACTION PHACO AND INTRAOCULAR LENS PLACEMENT (IOC) LEFT 5.27 00:38.6;  Surgeon: Galen Manila, MD;  Location: [facility];  Service: Ophthalmology;  Laterality: Left;  sleep apnea   CATARACT EXTRACTION W/PHACO Right 04/09/2019   Procedure: CATARACT EXTRACTION PHACO AND INTRAOCULAR LENS PLACEMENT (IOC) RIGHT 4.84 00:52.4;  Surgeon: Galen Manila, MD;  Location: [facility];  Service: Ophthalmology;  Laterality: Right;   colonoscopy with polypectomy  12/2016   COLONOSCOPY WITH PROPOFOL N/A 12/27/2016   Procedure: COLONOSCOPY WITH PROPOFOL;  Surgeon: Toledo, Boykin Nearing, MD;  Location: [facility];  Service: Endoscopy;  Laterality: N/A;   COLONOSCOPY WITH PROPOFOL N/A 03/23/2020   Procedure: COLONOSCOPY WITH PROPOFOL;  Surgeon: Toledo, Boykin Nearing, MD;  Location: [facility];  Service: Gastroenterology;  Laterality: N/A;   ESOPHAGOGASTRODUODENOSCOPY (EGD) WITH PROPOFOL N/A 09/21/2017   Procedure: ESOPHAGOGASTRODUODENOSCOPY (EGD) WITH PROPOFOL;  Surgeon: Christena Deem,  MD;  Location: [facility];  Service: Endoscopy;  Laterality: N/A;   ESOPHAGOGASTRODUODENOSCOPY (EGD) WITH PROPOFOL N/A 03/23/2020   Procedure: ESOPHAGOGASTRODUODENOSCOPY (EGD) WITH PROPOFOL;  Surgeon: Toledo, Boykin Nearing, MD;  Location: [facility];  Service: Gastroenterology;  Laterality: N/A;   melonoma removal     prostat biopsy x2     right deltoid resection in 1977     s/p resection of his right deltoid muscle in 1977     TONSILLECTOMY     TONSILLECTOMY AND ADENOIDECTOMY     Social History:  reports that he quit smoking about 47 years ago. His smoking use included cigarettes. He has never used smokeless tobacco. He reports that he does not currently use alcohol. He reports that he does not currently use  drugs.  Allergies  Allergen Reactions   Ativan [Lorazepam]    Benadryl [Diphenhydramine]     Withdraw symptoms per patient   Crestor [Rosuvastatin Calcium]    Nsaids    Rapaflo [Silodosin]    Statins Other (See Comments)   Zetia [Ezetimibe]    Latex Rash    Bandaids only   Neosporin [Neomycin-Bacitracin Zn-Polymyx] Rash   Family History  Problem Relation Age of Onset   Ovarian cancer Mother    Heart attack Father    Obesity Paternal Uncle    Family history: Family history reviewed and not pertinent.  Prior to Admission medications   Medication Sig Start Date End Date Taking? Authorizing Provider  acetaminophen (TYLENOL) 325 MG tablet Take 650 mg by mouth every 6 (six) hours as needed.    [provider]  Apoaequorin (PREVAGEN PO) Take by mouth daily.    [provider]  Ascorbic Acid (VITAMIN C PO) Take 500 mg by mouth daily.    [provider]  Calcium Citrate-Vitamin D (CITRACAL + D PO) Take by mouth 2 (two) times daily.    [provider]  cyanocobalamin (VITAMIN B12) 1000 MCG/ML injection Inject 1,000 mcg into the muscle every 30 (thirty) days. 11/18/21 11/13/22  [provider]  ferrous sulfate 324 MG TBEC Take 325 mg by mouth daily with breakfast.    [provider]  finasteride (PROSCAR) 5 MG tablet Take 5 mg daily by mouth.    [provider]  KRILL OIL PO Take by mouth daily.    [provider]  losartan (COZAAR) 25 MG tablet Take 1 tablet by mouth daily. 07/22/21 07/22/22  [provider]  Misc Natural Products (TART CHERRY ADVANCED PO) Take by mouth daily.    [provider]  polyethylene glycol (MIRALAX / GLYCOLAX) 17 g packet Take 17 g by mouth daily as needed.    [provider]  pramipexole (MIRAPEX) 0.5 MG tablet Take 0.5 mg 3 (three) times daily by mouth.    [provider]  Probiotic Product (ALIGN PO) Take daily by mouth.    [provider]   tamsulosin (FLOMAX) 0.4 MG CAPS capsule Take 0.4 mg by mouth daily.    [provider]  Thiamine HCl (VITAMIN B1 PO) Take 1 tablet by mouth daily.    [provider]  traMADol (ULTRAM) 50 MG tablet Take 50 mg every 6 (six) hours as needed by mouth.    [provider]   Physical Exam: Vitals:   08/02/22 0855 08/02/22 0900 08/02/22 1200 08/02/22 1631  BP: (!) 142/97  (!) 140/95 (!) 151/93  Pulse: 60  62 81  Resp: 18  18 19   Temp: 98.1 F (36.7 C)  98.1 F (36.7 C)   TempSrc: Oral     SpO2: 99%  99% 100%  Weight:  74.4 kg    Height:  6' (1.829 m)     Constitutional: appears age appropriate, NAD, calm Eyes: PERRL, lids and conjunctivae normal ENMT: Mucous membranes are moist. Posterior pharynx clear of any exudate or lesions. Age-appropriate dentition. Hearing appropriate Neck: normal, supple, no masses, no thyromegaly Respiratory: clear to auscultation bilaterally, no wheezing, no crackles. Normal respiratory effort. No accessory muscle use.  Cardiovascular: Regular rate and rhythm, no murmurs / rubs / gallops. No extremity edema. 2+ pedal pulses. No carotid bruits.  Abdomen: no tenderness, no masses palpated, no hepatosplenomegaly. Bowel sounds positive.  Musculoskeletal: no clubbing / cyanosis. No joint deformity upper and lower extremities. Good ROM, no contractures, no atrophy. Normal muscle tone.  Skin: no rashes, lesions, ulcers. No induration Neurologic: Sensation intact. Strength 5/5 in all 4.  Psychiatric: Normal judgment and insight. Alert and oriented x 3. Normal mood.   EKG: Not indicated at this time  Chest x-ray on Admission: I personally reviewed and I agree with radiologist reading as below.  CT ABDOMEN PELVIS W CONTRAST  Result Date: 08/02/2022 CLINICAL DATA:  Metastatic disease evaluation. EXAM: CT ABDOMEN AND PELVIS WITH CONTRAST TECHNIQUE: Multidetector CT imaging of the abdomen and pelvis was performed using the standard protocol  following bolus administration of intravenous contrast. RADIATION DOSE REDUCTION: This exam was performed according to the departmental dose-optimization program which includes automated exposure control, adjustment of the mA and/or kV according to patient size and/or use of iterative reconstruction technique. CONTRAST:  80mL OMNIPAQUE IOHEXOL 300 MG/ML  SOLN COMPARISON:  Chest CT performed earlier today. FINDINGS: Lower chest: Assessed on chest CT earlier today. Hepatobiliary: No focal hepatic lesion. Borderline hepatic steatosis. Unremarkable gallbladder. No biliary dilatation. Pancreas: Calcifications in the pancreatic head typical of chronic pancreatitis. No acute pancreatic inflammation. No ductal dilatation or pancreatic mass. Spleen: Normal in size without focal abnormality. Adrenals/Urinary Tract: No adrenal nodule. There is early excretion of IV contrast in the renal collecting systems which limits assessment for stone. No hydronephrosis. Simple cyst no solid renal lesion. Excreted IV contrast within the urinary bladder. In the left kidney, needing no further imaging follow-up. Stomach/Bowel: No bowel obstruction or inflammation. Moderate volume of colonic stool. Scattered colonic diverticulosis without diverticulitis. Small hiatal hernia. Vascular/Lymphatic: Aortic atherosclerosis. No aneurysm. Iliac vessels are tortuous. No enlarged lymph nodes in the abdomen or pelvis. Reproductive: Enlarged prostate spans 5.1 cm. Other: No ascites. No omental thickening. No subcutaneous lesions. Musculoskeletal: Diffuse lumbar degenerative change. No lytic or blastic osseous lesions. IMPRESSION: 1. No evidence of primary malignancy or metastatic disease in the abdomen/pelvis. 2. Colonic diverticulosis without diverticulitis. 3. Enlarged prostate. 4. Chronic pancreatitis. Aortic Atherosclerosis (ICD10-I70.0). Electronically Signed   By: Narda Rutherford M.D.   On: 08/02/2022 15:41   CT Chest W Contrast  Result Date:  08/02/2022 CLINICAL DATA:  Followup abnormal chest x-ray. EXAM: CT CHEST WITH CONTRAST TECHNIQUE: Multidetector CT imaging of the chest was performed during intravenous contrast administration. RADIATION DOSE REDUCTION: This exam was performed according to the departmental dose-optimization program which includes automated exposure control, adjustment of the mA and/or kV according to patient size and/or use of iterative reconstruction technique. CONTRAST:  75mL OMNIPAQUE IOHEXOL 300 MG/ML  SOLN COMPARISON:  Chest x-ray, same date. FINDINGS: Cardiovascular: The heart is normal in size. No pericardial effusion. Age related atherosclerotic calcifications involving the aorta but no aneurysm or dissection. The branch vessels are  patent. Three-vessel coronary artery calcifications are noted. Mediastinum/Nodes: Extensive left hilar adenopathy. There is also a borderline enlarged prevascular node measuring 9 mm on image 82/2. Left-sided subcarinal node measures 11 mm on image 88/2. No contralateral mediastinal adenopathy. The esophagus is grossly. Lungs/Pleura: Large partially necrotic central left lung mass involving both the inferior aspect of the left upper lobe and the superior aspect of the left lower lobe. It measures approximately 7.1 x 6.0 x 4.4 cm. It surrounds the descending thoracic aorta. Findings consistent with a primary neoplasm. Recommend bronchoscopic biopsy. Interstitial changes surrounding lesion peripherally in the left lower lobe could be obstructive pneumonitis and atelectasis. No worrisome pulmonary nodules to suggest pulmonary metastatic disease. No pleural effusions or pleural nodules. Upper Abdomen: No significant upper abdominal findings. No hepatic or adrenal gland lesions. No upper abdominal adenopathy. Simple 6 cm nonenhancing left renal cyst not requiring any further imaging evaluation or follow-up. Moderate age related atherosclerotic calcifications involving the abdominal aorta and branch  vessels. Musculoskeletal: No chest wall mass, supraclavicular or axillary adenopathy. The thyroid gland is unremarkable. No worrisome bone lesions to suggest metastatic disease. Advanced degenerative changes involving the right shoulder. IMPRESSION: 1. 7.1 x 6.0 x 4.4 cm partially necrotic central left lung mass involving both the inferior aspect of the left upper lobe and the superior aspect of the left lower lobe. Findings consistent with a primary neoplasm. Recommend bronchoscopic biopsy. 2. Extensive left hilar adenopathy and borderline enlarged prevascular and subcarinal lymph nodes. 3. No worrisome pulmonary nodules to suggest pulmonary metastatic disease. 4. No findings for upper abdominal metastatic disease or osseous metastatic disease. 5. Age related atherosclerotic calcifications involving the thoracic and abdominal aorta and branch vessels including the coronary arteries. 6. Aortic atherosclerosis. Aortic Atherosclerosis (ICD10-I70.0). Electronically Signed   By: Rudie Meyer M.D.   On: 08/02/2022 13:12   DG Chest 2 View  Result Date: 08/02/2022 CLINICAL DATA:  coughing up blood EXAM: CHEST - 2 VIEW COMPARISON:  CXR 07/02/21 FINDINGS: No pleural effusion. No pneumothorax. Unchanged cardiac contours. Compared to prior exam there may be a new focal airspace opacity in the left perihilar region. No radiographically apparent displaced rib fractures. Visualized upper is unremarkable. Vertebral body heights are maintained. IMPRESSION: New perihilar opacity on the left could represent infection or atelectasis. Recommend further evaluation with a CT of the chest to exclude the possibility an endobronchial or peribronchial lesion. Electronically Signed   By: Lorenza Cambridge M.D.   On: 08/02/2022 10:06    Labs on Admission: I have personally reviewed following labs  CBC: Recent Labs  Lab 08/02/22 0911  WBC 9.7  NEUTROABS 8.0*  HGB 12.5*  HCT 37.8*  MCV 92.4  PLT 275   Basic Metabolic  Panel: Recent Labs  Lab 08/02/22 0911  NA 135  K 4.6  CL 103  CO2 25  GLUCOSE 125*  BUN 17  CREATININE 0.65  CALCIUM 9.8   GFR: Estimated Creatinine Clearance: 72.3 mL/min (by C-G formula based on SCr of 0.65 mg/dL).  Liver Function Tests: Recent Labs  Lab 08/02/22 0911  AST 17  ALT 18  ALKPHOS 116  BILITOT 0.5  PROT 6.6  ALBUMIN 3.9   Urine analysis:    Component Value Date/Time   COLORURINE STRAW (A) 07/02/2021 0831   APPEARANCEUR CLEAR (A) 07/02/2021 0831   APPEARANCEUR Clear 11/20/2019 1137   LABSPEC 1.008 07/02/2021 0831   PHURINE 6.0 07/02/2021 0831   GLUCOSEU NEGATIVE 07/02/2021 0831   HGBUR NEGATIVE 07/02/2021 0831   BILIRUBINUR  NEGATIVE 07/02/2021 0831   BILIRUBINUR Negative 11/20/2019 1137   KETONESUR NEGATIVE 07/02/2021 0831   PROTEINUR NEGATIVE 07/02/2021 0831   NITRITE NEGATIVE 07/02/2021 0831   LEUKOCYTESUR NEGATIVE 07/02/2021 0831   This document was prepared using Dragon Voice Recognition software and may include unintentional dictation errors.  Dr. Sedalia Muta [facility]  If 7PM-7AM, please contact overnight-coverage provider If 7AM-7PM, please contact day attending provider [email or web address]  08/02/2022, 8:16 PM

## 2022-08-02 NOTE — Assessment & Plan Note (Addendum)
Left necrotic lung mass, inferior and superior aspect EDP has consulted oncology, Dr. Cathie Hoops who recommends hospitalist admission for further workup CT of the abdomen and pelvis with contrast ordered at the recommendation of Dr. Cathie Hoops Pulmonology service, Dr. Karna Christmas has been consulted for consideration of biopsy via bronchoscopy Keep n.p.o. pending pulmonology evaluation Telemetry medical, observation

## 2022-08-02 NOTE — Assessment & Plan Note (Signed)
-   CPAP nightly ordered 

## 2022-08-02 NOTE — ED Provider Notes (Signed)
[facility] Provider Note    Event Date/Time   First MD Initiated Contact with Patient 08/02/22 1130     (approximate)  History   Chief Complaint: Hemoptysis and Ingestion  HPI  Wesley Young is a 82 y.o. male with a past medical history of anxiety, alcohol use, CHF, depression, hypertension, presents to the emergency department for hemoptysis.  According to the patient and the wife approximately 2 weeks ago patient states he opened a new pack of vitamins, there is a small pouch which he assumed was vitamin C open the pouch up put the pellets in his mouth and swallowed with a glass of water.  He later found out that this was the silica package that was in the vitamins.  He denies choking.  He states however the following day he began with a cough and 2 or 3 days later noticed some hemoptysis.  Wife states that mopped assist had worsened for a day or 2 and then improved and the patient was doing much better however over the past 48 hours the hemoptysis has returned they were concerned so they came to the emergency department for evaluation.  Patient denies any chest pain denies any shortness of breath.  Patient satting 99% denies any fever at any point.  Physical Exam   Triage Vital Signs: ED Triage Vitals  Enc Vitals Group     BP 08/02/22 0855 (!) 142/97     Pulse Rate 08/02/22 0855 60     Resp 08/02/22 0855 18     Temp 08/02/22 0855 98.1 F (36.7 C)     Temp Source 08/02/22 0855 Oral     SpO2 08/02/22 0855 99 %     Weight 08/02/22 0900 164 lb (74.4 kg)     Height 08/02/22 0900 6' (1.829 m)     Head Circumference --      Peak Flow --      Pain Score 08/02/22 0858 1     Pain Loc --      Pain Edu? --      Excl. in GC? --     Most recent vital signs: Vitals:   08/02/22 0855 08/02/22 1200  BP: (!) 142/97 (!) 140/95  Pulse: 60 62  Resp: 18 18  Temp: 98.1 F (36.7 C) 98.1 F (36.7 C)  SpO2: 99% 99%    General: Awake, no distress.  CV:  Good  peripheral perfusion.  Regular rate and rhythm  Resp:  Normal effort.  Equal breath sounds bilaterally.  No wheeze rales or rhonchi. Abd:  No distention.  Soft, nontender.  No rebound or guarding.  ED Results / Procedures / Treatments   RADIOLOGY  I have reviewed and interpreted the chest x-ray images.  I do not see any obvious consolidation on my evaluation. Radiology states perihilar opacity in the left could represent pneumonia versus mass.   MEDICATIONS ORDERED IN ED: Medications  iohexol (OMNIPAQUE) 300 MG/ML solution 75 mL (75 mLs Intravenous Contrast Given 08/02/22 1249)     IMPRESSION / MDM / ASSESSMENT AND PLAN / ED COURSE  I reviewed the triage vital signs and the nursing notes.  Patient's presentation is most consistent with acute presentation with potential threat to life or bodily function.  Patient presents to the emergency department for cough and hemoptysis.  Patient states 2 weeks ago he did swallow a packet of silica beads.  I do not suspect this was related to today's presentation here he had patient has  been coughing with some hemoptysis chest x-ray shows possible infection versus mass.  Lab work overall reassuring with a normal CBC and reassuring chemistry.  Will obtain a CT scan with contrast of the chest to further evaluate.  Differential would include pneumonia, aspiration, mass or oncologic process.  CT scan unfortunately shows a 7 cm necrotic mass in the lungs consistent with primary lung neoplasm.  I spoke with Dr. Cathie Hoops of oncology who would like to perform a CT of the abdomen and pelvis.  Given the patient's continued occasional hemoptysis recommends admission to the hospital for further evaluation and pulmonology consultation.  Lab work reassuring with a normal CBC, reassuring chemistry.  Patient continues to spit out mucus occasionally in the emergency department but states he has not had a significant amount of blood in his mucus since 2 AM.  Discussed the  findings with the patient he is agreeable to workup.  FINAL CLINICAL IMPRESSION(S) / ED DIAGNOSES   Hemoptysis Lung neoplasm  Note:  This document was prepared using Dragon voice recognition software and may include unintentional dictation errors.   Minna Antis, MD 08/02/22 1406

## 2022-08-02 NOTE — Consult Note (Signed)
Hematology/Oncology Consult note [phone] [phone]      Patient Care Team: Lauro Regulus, MD as PCP - General (Internal Medicine)   Name of the patient: Wesley Young  [id number]  [birth year]   REASON FOR COSULTATION:   History of presenting illness-  82 y.o. male with PMH listed at below who presents to ER for evaluation of hemoptysis. Hemoptysis started on 07/18/2022, initially very small amount.  Stopped for 3 days and again patient experienced hemoptysis today.  Presented emergency room for further evaluation.  He has noticed some weight loss, partially was intentional due to healthier diet.  Also wife attributes weight loss to recent life stress.  Patient is a former smoker.  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  Patient had a history of mustard gas exposure of right extremity during service.  In 1977, patient received cobalt radiation and surgery and since then has remained in remission. 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  He has history of uncontrolled bowel and family history of lumbar radiculopathy.  Longstanding general severe sensorimotor peripheral neuropathy.  He walks with a walker at baseline. Denies being on any anticoagulation or antiplatelet agents.  Denies lightheadedness, syncope.  08/02/2022, CT chest with contrast showed 1. 7.1 x 6.0 x 4.4 cm partially necrotic central left lung mass involving both the inferior aspect of the left upper lobe and the superior aspect of the left lower lobe. Findings consistent with a primary neoplasm. Recommend bronchoscopic biopsy. 2. Extensive left hilar adenopathy and borderline enlarged prevascular and subcarinal lymph nodes. 3. No worrisome pulmonary nodules to suggest  pulmonary metastatic disease. 4. No findings for upper abdominal metastatic disease or osseous metastatic disease. 5. Age related atherosclerotic calcifications involving the thoracic and abdominal aorta and branch vessels including the coronary arteries. 6. Aortic atherosclerosis.  CT abdomen pelvis with contrast showed 1. No evidence of primary malignancy or metastatic disease in the abdomen/pelvis. 2. Colonic diverticulosis without diverticulitis. 3. Enlarged prostate. 4. 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   Essential hypertension 08/02/2022   Depression with anxiety 08/02/2022   OSA on CPAP 08/02/2022   Chronic pain 08/02/2022     Past Medical History:  Diagnosis Date   Abnormal PSA    s/p post prostate biopsy in the past which was negative   Actinic keratosis 03/14/2007   L ant lat neck at base of neck - bx proven    Actinic keratosis 01/08/2014   R calf - bx proven    Alcoholism (HCC)    with prolonged hospitalization 2009 for withdrawal c/b aspiration pneumonia requiring vent   Anxiety    Atrial fibrillation and flutter (HCC)    B12 deficiency    Basal cell carcinoma 03/14/2007   R distal lat tricep near elbow    Basal cell carcinoma 03/06/2014   R  calf - excision 04/22/2014   Basal cell carcinoma 08/30/2021   suprasternal area, treated with EDC   Cardiomyopathy (HCC)    mild probably multifactorial secondary to HTn and possible alcohol contribution. Left ventricular ejection fraction app 45 %   CHF (congestive heart failure) (HCC)    mild left  ventricular systolic dysfunction   Dental bridge present    "[name]" bridge, top - right   Depression    Duodenitis    Dysplastic nevus 01/08/2014   L prox ant thigh - mild    Dyspnea    on exertion   Dyspnea on exertion    Dysrhythmia    Erosive esophagitis    Esophageal varices (HCC)    Fibrosarcoma (HCC)    Gross hematuria    High cholesterol    Hx of melanoma in situ 01/20/2015   L upper back paraspinal - excision    Hx of squamous cell carcinoma of skin 2014   multiple sites   Hypertension    Impotence    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HCC)    Sleep apnea    Spinal stenosis    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   L forearm - excision    Squamous cell carcinoma of skin 12/16/2015   R dorsum hand    Squamous cell carcinoma of skin 10/02/2017   R dorsum hand    Varicose veins of both lower extremities      Past Surgical History:  Procedure Laterality Date   CATARACT EXTRACTION W/PHACO Left 03/12/2019   Procedure: CATARACT EXTRACTION PHACO AND INTRAOCULAR LENS PLACEMENT (IOC) LEFT 5.27 00:38.6;  Surgeon: Galen Manila, MD;  Location: [facility];  Service: Ophthalmology;  Laterality: Left;  sleep apnea   CATARACT EXTRACTION W/PHACO Right 04/09/2019   Procedure: CATARACT EXTRACTION PHACO AND INTRAOCULAR LENS PLACEMENT (IOC) RIGHT 4.84 00:52.4;  Surgeon: Galen Manila, MD;  Location: [facility];  Service: Ophthalmology;  Laterality: Right;   colonoscopy with polypectomy  12/2016   COLONOSCOPY WITH PROPOFOL N/A 12/27/2016   Procedure: COLONOSCOPY WITH PROPOFOL;  Surgeon: Toledo, Boykin Nearing, MD;  Location: [facility];  Service: Endoscopy;  Laterality: N/A;   COLONOSCOPY WITH PROPOFOL N/A 03/23/2020   Procedure: COLONOSCOPY WITH PROPOFOL;  Surgeon: Toledo, Boykin Nearing, MD;  Location: [facility];  Service: Gastroenterology;  Laterality: N/A;   ESOPHAGOGASTRODUODENOSCOPY (EGD) WITH PROPOFOL N/A 09/21/2017   Procedure:  ESOPHAGOGASTRODUODENOSCOPY (EGD) WITH PROPOFOL;  Surgeon: Christena Deem, MD;  Location: [facility];  Service: Endoscopy;  Laterality: N/A;   ESOPHAGOGASTRODUODENOSCOPY (EGD) WITH PROPOFOL N/A 03/23/2020   Procedure: ESOPHAGOGASTRODUODENOSCOPY (EGD) WITH PROPOFOL;  Surgeon: Toledo, Boykin Nearing, MD;  Location: [facility];  Service: Gastroenterology;  Laterality: N/A;   melonoma removal     prostat biopsy x2     right deltoid resection in 1977     s/p resection of his right deltoid muscle in 1977     TONSILLECTOMY     TONSILLECTOMY AND ADENOIDECTOMY      Social History   Socioeconomic History   Marital status: Married    Spouse name: Not on file   Number of children: Not on file   Years of education: Not on file   Highest education level: Not on file  Occupational History   Not on file  Tobacco Use   Smoking status: Former    Types: Cigarettes    Quit date: 57    Years since quitting: 47.4   Smokeless tobacco: Never  Vaping Use   Vaping Use: Never  used  Substance and Sexual Activity   Alcohol use: Not Currently    Comment: quit 03/26/2008   Drug use: Not Currently   Sexual activity: Not Currently  Other Topics Concern   Not on file  Social History Narrative   Not on file   Social Determinants of Health   Financial Resource Strain: Not on file  Food Insecurity: Not on file  Transportation Needs: Not on file  Physical Activity: Not on file  Stress: Not on file  Social Connections: Not on file  Intimate Partner Violence: Not on file     Family History  Problem Relation Age of Onset   Ovarian cancer Mother    Heart attack Father    Obesity Paternal Uncle      Current Facility-Administered Medications:    acetaminophen (TYLENOL) tablet 650 mg, 650 mg, Oral, Q6H PRN **OR** acetaminophen (TYLENOL) suppository 650 mg, 650 mg, Rectal, Q6H PRN, Cox, Amy N, DO   [START ON 08/03/2022] donepezil (ARICEPT) tablet 5 mg, 5 mg, Oral, q AM, Cox, Amy N, DO   [START ON  08/03/2022] finasteride (PROSCAR) tablet 5 mg, 5 mg, Oral, Daily, Cox, Amy N, DO   heparin injection 5,000 Units, 5,000 Units, Subcutaneous, Q8H, Cox, Amy N, DO   hydrALAZINE (APRESOLINE) injection 5 mg, 5 mg, Intravenous, Q6H PRN, Cox, Amy N, DO   [START ON 08/03/2022] losartan (COZAAR) tablet 50 mg, 50 mg, Oral, Daily, Cox, Amy N, DO   melatonin tablet 5 mg, 5 mg, Oral, QHS PRN, Cox, Amy N, DO   ondansetron (ZOFRAN) tablet 4 mg, 4 mg, Oral, Q6H PRN **OR** ondansetron (ZOFRAN) injection 4 mg, 4 mg, Intravenous, Q6H PRN, Cox, Amy N, DO   pramipexole (MIRAPEX) tablet 0.5 mg, 0.5 mg, Oral, QHS, Cox, Amy N, DO   senna-docusate (Senokot-S) tablet 1 tablet, 1 tablet, Oral, QHS PRN, Cox, Amy N, DO   [START ON 08/03/2022] tamsulosin (FLOMAX) capsule 0.4 mg, 0.4 mg, Oral, Daily, Cox, Amy N, DO   traMADol (ULTRAM) tablet 50 mg, 50 mg, Oral, Q6H PRN, Cox, Amy N, DO  Review of Systems - Oncology  PHYSICAL EXAM Vitals:   08/02/22 0855 08/02/22 0900 08/02/22 1200  BP: (!) 142/97  (!) 140/95  Pulse: 60  62  Resp: 18  18  Temp: 98.1 F (36.7 C)  98.1 F (36.7 C)  TempSrc: Oral    SpO2: 99%  99%  Weight:  164 lb (74.4 kg)   Height:  6' (1.829 m)    Physical Exam    LABORATORY STUDIES    Latest Ref Rng & Units 08/02/2022    9:11 AM 11/18/2021   11:01 AM 10/05/2021    4:55 PM  CBC  WBC 4.0 - 10.5 K/uL 9.7  7.7  6.9   Hemoglobin 13.0 - 17.0 g/dL 38.7  56.4  33.2   Hematocrit 39.0 - 52.0 % 37.8  41.4  41.0   Platelets 150 - 400 K/uL 275  274  262       Latest Ref Rng & Units 08/02/2022    9:11 AM 11/18/2021   11:01 AM 10/05/2021    4:55 PM  CMP  Glucose 70 - 99 mg/dL 951  884  166   BUN 8 - 23 mg/dL 17  22  16    Creatinine 0.61 - 1.24 mg/dL 0.63  0.16  0.10   Sodium 135 - 145 mmol/L 135  138  139   Potassium 3.5 - 5.1 mmol/L 4.6  4.4  4.5  Chloride 98 - 111 mmol/L 103  104  104   CO2 22 - 32 mmol/L 25  27  28    Calcium 8.9 - 10.3 mg/dL 9.8  9.9  41.3   Total Protein 6.5 - 8.1 g/dL 6.6   7.1    Total Bilirubin 0.3 - 1.2 mg/dL 0.5  0.7    Alkaline Phos 38 - 126 U/L 116  115    AST 15 - 41 U/L 17  16    ALT 0 - 44 U/L 18  18       RADIOGRAPHIC STUDIES: I have personally reviewed the radiological images as listed and agreed with the findings in the report. CT ABDOMEN PELVIS W CONTRAST  Result Date: 08/02/2022 CLINICAL DATA:  Metastatic disease evaluation. EXAM: CT ABDOMEN AND PELVIS WITH CONTRAST TECHNIQUE: Multidetector CT imaging of the abdomen and pelvis was performed using the standard protocol following bolus administration of intravenous contrast. RADIATION DOSE REDUCTION: This exam was performed according to the departmental dose-optimization program which includes automated exposure control, adjustment of the mA and/or kV according to patient size and/or use of iterative reconstruction technique. CONTRAST:  80mL OMNIPAQUE IOHEXOL 300 MG/ML  SOLN COMPARISON:  Chest CT performed earlier today. FINDINGS: Lower chest: Assessed on chest CT earlier today. Hepatobiliary: No focal hepatic lesion. Borderline hepatic steatosis. Unremarkable gallbladder. No biliary dilatation. Pancreas: Calcifications in the pancreatic head typical of chronic pancreatitis. No acute pancreatic inflammation. No ductal dilatation or pancreatic mass. Spleen: Normal in size without focal abnormality. Adrenals/Urinary Tract: No adrenal nodule. There is early excretion of IV contrast in the renal collecting systems which limits assessment for stone. No hydronephrosis. Simple cyst no solid renal lesion. Excreted IV contrast within the urinary bladder. In the left kidney, needing no further imaging follow-up. Stomach/Bowel: No bowel obstruction or inflammation. Moderate volume of colonic stool. Scattered colonic diverticulosis without diverticulitis. Small hiatal hernia. Vascular/Lymphatic: Aortic atherosclerosis. No aneurysm. Iliac vessels are tortuous. No enlarged lymph nodes in the abdomen or pelvis. Reproductive:  Enlarged prostate spans 5.1 cm. Other: No ascites. No omental thickening. No subcutaneous lesions. Musculoskeletal: Diffuse lumbar degenerative change. No lytic or blastic osseous lesions. IMPRESSION: 1. No evidence of primary malignancy or metastatic disease in the abdomen/pelvis. 2. Colonic diverticulosis without diverticulitis. 3. Enlarged prostate. 4. Chronic pancreatitis. Aortic Atherosclerosis (ICD10-I70.0). Electronically Signed   By: Narda Rutherford M.D.   On: 08/02/2022 15:41   CT Chest W Contrast  Result Date: 08/02/2022 CLINICAL DATA:  Followup abnormal chest x-ray. EXAM: CT CHEST WITH CONTRAST TECHNIQUE: Multidetector CT imaging of the chest was performed during intravenous contrast administration. RADIATION DOSE REDUCTION: This exam was performed according to the departmental dose-optimization program which includes automated exposure control, adjustment of the mA and/or kV according to patient size and/or use of iterative reconstruction technique. CONTRAST:  75mL OMNIPAQUE IOHEXOL 300 MG/ML  SOLN COMPARISON:  Chest x-ray, same date. FINDINGS: Cardiovascular: The heart is normal in size. No pericardial effusion. Age related atherosclerotic calcifications involving the aorta but no aneurysm or dissection. The branch vessels are patent. Three-vessel coronary artery calcifications are noted. Mediastinum/Nodes: Extensive left hilar adenopathy. There is also a borderline enlarged prevascular node measuring 9 mm on image 82/2. Left-sided subcarinal node measures 11 mm on image 88/2. No contralateral mediastinal adenopathy. The esophagus is grossly. Lungs/Pleura: Large partially necrotic central left lung mass involving both the inferior aspect of the left upper lobe and the superior aspect of the left lower lobe. It measures approximately 7.1 x 6.0 x 4.4 cm.  It surrounds the descending thoracic aorta. Findings consistent with a primary neoplasm. Recommend bronchoscopic biopsy. Interstitial changes  surrounding lesion peripherally in the left lower lobe could be obstructive pneumonitis and atelectasis. No worrisome pulmonary nodules to suggest pulmonary metastatic disease. No pleural effusions or pleural nodules. Upper Abdomen: No significant upper abdominal findings. No hepatic or adrenal gland lesions. No upper abdominal adenopathy. Simple 6 cm nonenhancing left renal cyst not requiring any further imaging evaluation or follow-up. Moderate age related atherosclerotic calcifications involving the abdominal aorta and branch vessels. Musculoskeletal: No chest wall mass, supraclavicular or axillary adenopathy. The thyroid gland is unremarkable. No worrisome bone lesions to suggest metastatic disease. Advanced degenerative changes involving the right shoulder. IMPRESSION: 1. 7.1 x 6.0 x 4.4 cm partially necrotic central left lung mass involving both the inferior aspect of the left upper lobe and the superior aspect of the left lower lobe. Findings consistent with a primary neoplasm. Recommend bronchoscopic biopsy. 2. Extensive left hilar adenopathy and borderline enlarged prevascular and subcarinal lymph nodes. 3. No worrisome pulmonary nodules to suggest pulmonary metastatic disease. 4. No findings for upper abdominal metastatic disease or osseous metastatic disease. 5. Age related atherosclerotic calcifications involving the thoracic and abdominal aorta and branch vessels including the coronary arteries. 6. Aortic atherosclerosis. Aortic Atherosclerosis (ICD10-I70.0). Electronically Signed   By: Rudie Meyer M.D.   On: 08/02/2022 13:12   DG Chest 2 View  Result Date: 08/02/2022 CLINICAL DATA:  coughing up blood EXAM: CHEST - 2 VIEW COMPARISON:  CXR 07/02/21 FINDINGS: No pleural effusion. No pneumothorax. Unchanged cardiac contours. Compared to prior exam there may be a new focal airspace opacity in the left perihilar region. No radiographically apparent displaced rib fractures. Visualized upper is  unremarkable. Vertebral body heights are maintained. IMPRESSION: New perihilar opacity on the left could represent infection or atelectasis. Recommend further evaluation with a CT of the chest to exclude the possibility an endobronchial or peribronchial lesion. Electronically Signed   By: Lorenza Cambridge M.D.   On: 08/02/2022 10:06   MR BRAIN WO CONTRAST  Result Date: 05/28/2022 CLINICAL DATA:  Memory loss EXAM: MRI HEAD WITHOUT CONTRAST TECHNIQUE: Multiplanar, multiecho pulse sequences of the brain and surrounding structures were obtained without intravenous contrast. Additionally, using NeuroQuant software a 3D volumetric analysis of the brain was performed and is compared to a normative database adjusted for age, gender and intracranial volume. COMPARISON:  None Available. 11/15/2007 FINDINGS: Brain: No restricted diffusion to suggest acute or subacute infarct. No acute hemorrhage, mass, mass effect, or midline shift. No hydrocephalus or extra-axial collection. Normal pituitary and craniocervical junction. No hemosiderin deposition to suggest remote hemorrhage. Mildly advanced cerebral atrophy for age, with slightly more prominent atrophy in the right temporal and occipital lobes, with ex vacuo dilatation of the right temporal and occipital horns. Scattered T2 hyperintense signal in the periventricular white matter and pons, likely the sequela of mild-to-moderate chronic small vessel ischemic disease. Vascular: Normal arterial flow voids. Skull and upper cervical spine: Normal marrow signal. Sinuses/Orbits: Clear paranasal sinuses. No acute finding in the orbits. Status post bilateral lens replacements. Other: The mastoid air cells are well aerated. NeuroQuant Findings: Volumetric analysis of the brain was performed, with a fully detailed report in [facility]. Briefly, the comparison with age and gender matched reference reveals whole brain volume in the 1st percentile and cortical gray matter at the 5th  percentile. Hippocampal volume is at the 30th percentile. IMPRESSION: 1. No acute intracranial process. Mildly advanced atrophy for age, with slightly  more prominent atrophy in the right temporal and occipital lobes. 2. NeuroQuant volumetric analysis of the brain, see details on [employer]. Electronically Signed   By: Wiliam Ke M.D.   On: 05/28/2022 02:35     Assessment and plan-   # Acute hemoptysis due to large partially necrotic central left lung mass, with extensive left hilar adenopathy and borderline enlarged mediastinal lymph nodes.  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  Recommend biopsy via bronchoscopy Tranexamic Acid nebulizer Appreciate pulmonology input. Outpatient MRI brain with and without contrast for staging. He will need to follow-up outpatient with me to discuss biopsy results and available treatment options.  # History of fibrosarcoma, in remission.  Thank you for allowing me to participate in the care of this patient.   Rickard Patience, MD, PhD Hematology Oncology 08/02/2022

## 2022-08-02 NOTE — Assessment & Plan Note (Signed)
Suspect secondary to hemoptysis in setting of new pulmonary neoplasm diagnosis, continue monitoring Repeat CBC in a.m.

## 2022-08-02 NOTE — Assessment & Plan Note (Addendum)
Resumed home losartan 50 mg daily Hydralazine 5 mg IV every 6 hours as needed for SBP greater 175, 4 days ordered

## 2022-08-02 NOTE — Assessment & Plan Note (Addendum)
Secondary to primary pulmonary neoplasm With mild decrease in hgb-no current active hemoptysis  -Tranexamic Acid nebulizer as needed -Monitor hemoglobin

## 2022-08-02 NOTE — Assessment & Plan Note (Signed)
Resumed home tramadol 50 mg every 6 hours as needed for moderate pain -Continue home pramipexole for restless leg

## 2022-08-02 NOTE — ED Triage Notes (Signed)
Arrives from [facility] for ED evaluation.  States patient 2 weeks ago ingested the silica packet from medications, thinking it was the vitamins.  Since that time patient with dysphagia and coughing up some blood.  Caregiver not aware of ingestion until one week after and then presented today.

## 2022-08-02 NOTE — Hospital Course (Addendum)
[name] is an 82 year old male with history of hypertension, anxiety, depression, history of alcohol use, history of fibrosarcoma in 1977, and history of melanoma s/p removal (2022), who presents to the emergency department for chief concerns of coughing up blood  Vitals in the ED showed temperature of 98.1, respiration rate of 18, heart rate of 62, blood pressure 140/95, SpO2 of 99% on room air.  Serum sodium is 135, potassium 4.6, chloride 103, bicarb 25, BUN of 17, serum creatinine of 0.65, EGFR greater than 60, nonfasting blood glucose 125, WBC 9.7, hemoglobin 12.5, platelets of 275.  CT of the chest with contrast: Was read as 7.1 x 6.0 x 4.4 cm partially necrotic central left lung mass involving the inferior aspect of the left upper lobe and superior aspect of the left lower lobe.  Findings consistent with primary neoplasm.  Aortic atherosclerosis.  No worrisome pulmonary nodules to suggest pulmonary metastatic disease.  6/12: Pulmonary and oncology was consulted and patient will have his bronchoscopy on Friday.  Hemoglobin at 12.1.  CT abdomen and pelvis was negative for any evidence of metastatic disease.  6/13: Hemodynamically stable, blood pressure on softer side.  Hemoglobin decreased to 11.6.  6/14: Patient remained hemodynamically stable.  Underwent bronchoscopy, tumor was found between left middle and lower lobe, multiple biopsies were taken.  Concern of metastasis to lymph node.  No hemoptysis after the procedure.  Patient is being discharged with some Tessalon Perles to use as needed for cough.  He will need a close follow-up with his providers which should include pulmonary and oncology to discuss the biopsy results and further management.  Plan was discussed with patient and wife at bedside.

## 2022-08-02 NOTE — Consult Note (Signed)
PULMONOLOGY         Date: 08/02/2022,   MRN# [id number] Wesley Young 1940/04/21     AdmissionWeight: 74.4 kg                 CurrentWeight: 74.4 kg  Referring provider: Dr Sedalia Muta    CHIEF COMPLAINT:   Non-massive hemoptysis with lung mass   HISTORY OF PRESENT ILLNESS   This is a pleasant 82yo male with hx of hypertension, anxiety, depression, history of alcohol use, history of fibrosarcoma in 1977, and history of melanoma s/p removal (2022), who presents to the emergency department for chief concerns of non massive hemoptysis. Vitals in the ED showed temperature of 98.1, respiration rate of 18, heart rate of 62, blood pressure 140/95, SpO2 of 99% on room air.  Serum sodium is 135, potassium 4.6, chloride 103, bicarb 25, BUN of 17, serum creatinine of 0.65, EGFR greater than 60, nonfasting blood glucose 125, WBC 9.7, hemoglobin 12.5, platelets of 275.   CT of the chest with contrast: Was read as 7.1 x 6.0 x 4.4 cm partially necrotic central left lung mass involving the inferior aspect of the left upper lobe and superior aspect of the left lower lobe.  Findings consistent with primary neoplasm.  Aortic atherosclerosis.  No worrisome pulmonary nodules to suggest pulmonary metastatic disease. PCCM consultation for further evaluation and management of non massive hemoptysis and left lung mass.    PAST MEDICAL HISTORY   Past Medical History:  Diagnosis Date   Abnormal PSA    s/p post prostate biopsy in the past which was negative   Actinic keratosis 03/14/2007   L ant lat neck at base of neck - bx proven    Actinic keratosis 01/08/2014   R calf - bx proven    Alcoholism (HCC)    with prolonged hospitalization 2009 for withdrawal c/b aspiration pneumonia requiring vent   Anxiety    Atrial fibrillation and flutter (HCC)    B12 deficiency    Basal cell carcinoma 03/14/2007   R distal lat tricep near elbow    Basal cell carcinoma 03/06/2014   R calf - excision 04/22/2014    Basal cell carcinoma 08/30/2021   suprasternal area, treated with EDC   Cardiomyopathy (HCC)    mild probably multifactorial secondary to HTn and possible alcohol contribution. 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   L upper back paraspinal - excision    Hx of squamous cell carcinoma of skin 2014   multiple sites   Hypertension    Impotence    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HCC)    Sleep apnea    Spinal stenosis    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   L forearm - excision    Squamous cell carcinoma of skin 12/16/2015   R dorsum hand    Squamous cell carcinoma of skin 10/02/2017   R  dorsum hand    Varicose veins of both lower extremities      SURGICAL HISTORY   Past Surgical History:  Procedure Laterality Date   CATARACT EXTRACTION W/PHACO Left 03/12/2019   Procedure: CATARACT EXTRACTION PHACO AND INTRAOCULAR LENS PLACEMENT (IOC) LEFT 5.27 00:38.6;  Surgeon: Galen Manila, MD;  Location: [facility];  Service: Ophthalmology;  Laterality: Left;  sleep apnea   CATARACT EXTRACTION W/PHACO Right 04/09/2019   Procedure: CATARACT EXTRACTION PHACO AND INTRAOCULAR LENS PLACEMENT (IOC) RIGHT 4.84 00:52.4;  Surgeon: Galen Manila, MD;  Location: [facility];  Service: Ophthalmology;  Laterality: Right;   colonoscopy with polypectomy  12/2016   COLONOSCOPY WITH PROPOFOL N/A 12/27/2016   Procedure: COLONOSCOPY WITH PROPOFOL;  Surgeon: Toledo, Boykin Nearing, MD;  Location: [facility];  Service: Endoscopy;   Laterality: N/A;   COLONOSCOPY WITH PROPOFOL N/A 03/23/2020   Procedure: COLONOSCOPY WITH PROPOFOL;  Surgeon: Toledo, Boykin Nearing, MD;  Location: [facility];  Service: Gastroenterology;  Laterality: N/A;   ESOPHAGOGASTRODUODENOSCOPY (EGD) WITH PROPOFOL N/A 09/21/2017   Procedure: ESOPHAGOGASTRODUODENOSCOPY (EGD) WITH PROPOFOL;  Surgeon: Christena Deem, MD;  Location: [facility];  Service: Endoscopy;  Laterality: N/A;   ESOPHAGOGASTRODUODENOSCOPY (EGD) WITH PROPOFOL N/A 03/23/2020   Procedure: ESOPHAGOGASTRODUODENOSCOPY (EGD) WITH PROPOFOL;  Surgeon: Toledo, Boykin Nearing, MD;  Location: [facility];  Service: Gastroenterology;  Laterality: N/A;   melonoma removal     prostat biopsy x2     right deltoid resection in 1977     s/p resection of his right deltoid muscle in 1977     TONSILLECTOMY     TONSILLECTOMY AND ADENOIDECTOMY       FAMILY HISTORY   History reviewed. No pertinent family history.   SOCIAL HISTORY   Social History   Tobacco Use   Smoking status: Former    Types: Cigarettes    Quit date: 1977    Years since quitting: 47.4   Smokeless tobacco: Never  Vaping Use   Vaping Use: Never used  Substance Use Topics   Alcohol use: Not Currently    Comment: quit 03/26/2008   Drug use: Not Currently     MEDICATIONS    Home Medication:  Current Outpatient Rx   Order #: [id number] Class: Historical Med   Order #: [id number] Class: Historical Med   Order #: [id number] Class: Historical Med   Order #: [id number] Class: Historical Med   Order #: [id number] Class: Historical Med   Order #: [id number] Class: Historical Med   Order #: [id number] Class: Historical Med   Order #: [id number] Class: Historical Med   Order #: [id number] Class: Historical Med   Order #: [id number] Class: Historical Med   Order #: [id number] Class: Historical Med   Order #: [id number] Class: Historical Med   Order #: [id number] Class: Historical Med   Order #: [id number] Class: Historical Med   Order #:  [id number] Class: Historical Med   Order #: [id number] Class: Historical Med    Current Medication:  Current Facility-Administered Medications:    acetaminophen (TYLENOL) tablet 650 mg, 650 mg, Oral, Q6H PRN **OR** acetaminophen (TYLENOL) suppository 650 mg, 650 mg, Rectal, Q6H PRN, Cox, Amy N, DO   heparin injection 5,000 Units, 5,000 Units, Subcutaneous, Q8H, Cox, Amy N, DO   ondansetron (ZOFRAN) tablet 4 mg, 4 mg, Oral, Q6H PRN **OR** ondansetron (ZOFRAN) injection 4 mg, 4 mg, Intravenous, Q6H PRN, Cox, Amy N, DO   senna-docusate (Senokot-S) tablet 1 tablet, 1 tablet, Oral, QHS PRN, Cox, Amy N, DO  Current Outpatient Medications:    acetaminophen (TYLENOL) 325 MG tablet, Take 650 mg  by mouth every 6 (six) hours as needed., Disp: , Rfl:    Apoaequorin (PREVAGEN PO), Take by mouth daily., Disp: , Rfl:    Ascorbic Acid (VITAMIN C PO), Take 500 mg by mouth daily., Disp: , Rfl:    Calcium Citrate-Vitamin D (CITRACAL + D PO), Take by mouth 2 (two) times daily., Disp: , Rfl:    cyanocobalamin (VITAMIN B12) 1000 MCG/ML injection, Inject 1,000 mcg into the muscle every 30 (thirty) days., Disp: , Rfl:    ferrous sulfate 324 MG TBEC, Take 325 mg by mouth daily with breakfast., Disp: , Rfl:    finasteride (PROSCAR) 5 MG tablet, Take 5 mg daily by mouth., Disp: , Rfl:    KRILL OIL PO, Take by mouth daily., Disp: , Rfl:    losartan (COZAAR) 25 MG tablet, Take 1 tablet by mouth daily., Disp: , Rfl:    Misc Natural Products (TART CHERRY ADVANCED PO), Take by mouth daily., Disp: , Rfl:    polyethylene glycol (MIRALAX / GLYCOLAX) 17 g packet, Take 17 g by mouth daily as needed., Disp: , Rfl:    pramipexole (MIRAPEX) 0.5 MG tablet, Take 0.5 mg 3 (three) times daily by mouth., Disp: , Rfl:    Probiotic Product (ALIGN PO), Take daily by mouth., Disp: , Rfl:    tamsulosin (FLOMAX) 0.4 MG CAPS capsule, Take 0.4 mg by mouth daily., Disp: , Rfl:    Thiamine HCl (VITAMIN B1 PO), Take 1 tablet by mouth daily., Disp: ,  Rfl:    traMADol (ULTRAM) 50 MG tablet, Take 50 mg every 6 (six) hours as needed by mouth., Disp: , Rfl:     ALLERGIES   Ativan [lorazepam], Benadryl [diphenhydramine], Crestor [rosuvastatin calcium], Nsaids, Rapaflo [silodosin], Statins, Zetia [ezetimibe], Latex, and Neosporin [neomycin-bacitracin zn-polymyx]     REVIEW OF SYSTEMS    Review of Systems:  Gen:  Denies  fever, sweats, chills weigh loss  HEENT: Denies blurred vision, double vision, ear pain, eye pain, hearing loss, nose bleeds, sore throat Cardiac:  No dizziness, chest pain or heaviness, chest tightness,edema Resp:   reports dyspnea chronically  Gi: Denies swallowing difficulty, stomach pain, nausea or vomiting, diarrhea, constipation, bowel incontinence Gu:  Denies bladder incontinence, burning urine Ext:   Denies Joint pain, stiffness or swelling Skin: Denies  skin rash, easy bruising or bleeding or hives Endoc:  Denies polyuria, polydipsia , polyphagia or weight change Psych:   Denies depression, insomnia or hallucinations   Other:  All other systems negative   VS: BP (!) 140/95   Pulse 62   Temp 98.1 F (36.7 C)   Resp 18   Ht 6' (1.829 m)   Wt 74.4 kg   SpO2 99%   BMI 22.24 kg/m      PHYSICAL EXAM    GENERAL:NAD, no fevers, chills, no weakness no fatigue HEAD: Normocephalic, atraumatic.  EYES: Pupils equal, round, reactive to light. Extraocular muscles intact. No scleral icterus.  MOUTH: Moist mucosal membrane. Dentition intact. No abscess noted.  EAR, NOSE, THROAT: Clear without exudates. No external lesions.  NECK: Supple. No thyromegaly. No nodules. No JVD.  PULMONARY: decreased breath sounds with mild rhonchi worse at bases bilaterally.  CARDIOVASCULAR: S1 and S2. Regular rate and rhythm. No murmurs, rubs, or gallops. No edema. Pedal pulses 2+ bilaterally.  GASTROINTESTINAL: Soft, nontender, nondistended. No masses. Positive bowel sounds. No hepatosplenomegaly.  MUSCULOSKELETAL: No  swelling, clubbing, or edema. Range of motion full in all extremities.  NEUROLOGIC: Cranial nerves II through XII are  intact. No gross focal neurological deficits. Sensation intact. Reflexes intact.  SKIN: No ulceration, lesions, rashes, or cyanosis. Skin warm and dry. Turgor intact.  PSYCHIATRIC: Mood, affect within normal limits. The patient is awake, alert and oriented x 3. Insight, judgment intact.       IMAGING   CLINICAL DATA:  Followup abnormal chest x-ray.   EXAM: CT CHEST WITH CONTRAST   TECHNIQUE: Multidetector CT imaging of the chest was performed during intravenous contrast administration.   RADIATION DOSE REDUCTION: This exam was performed according to the departmental dose-optimization program which includes automated exposure control, adjustment of the mA and/or kV according to patient size and/or use of iterative reconstruction technique.   CONTRAST:  75mL OMNIPAQUE IOHEXOL 300 MG/ML  SOLN   COMPARISON:  Chest x-ray, same date.   FINDINGS: Cardiovascular: The heart is normal in size. No pericardial effusion. Age related atherosclerotic calcifications involving the aorta but no aneurysm or dissection. The branch vessels are patent. Three-vessel coronary artery calcifications are noted.   Mediastinum/Nodes: Extensive left hilar adenopathy. There is also a borderline enlarged prevascular node measuring 9 mm on image 82/2. Left-sided subcarinal node measures 11 mm on image 88/2. No contralateral mediastinal adenopathy. The esophagus is grossly.   Lungs/Pleura: Large partially necrotic central left lung mass involving both the inferior aspect of the left upper lobe and the superior aspect of the left lower lobe. It measures approximately 7.1 x 6.0 x 4.4 cm. It surrounds the descending thoracic aorta. Findings consistent with a primary neoplasm. Recommend bronchoscopic biopsy. Interstitial changes surrounding lesion peripherally in the left lower lobe could be  obstructive pneumonitis and atelectasis. No worrisome pulmonary nodules to suggest pulmonary metastatic disease. No pleural effusions or pleural nodules.   Upper Abdomen: No significant upper abdominal findings. No hepatic or adrenal gland lesions. No upper abdominal adenopathy. Simple 6 cm nonenhancing left renal cyst not requiring any further imaging evaluation or follow-up. Moderate age related atherosclerotic calcifications involving the abdominal aorta and branch vessels.   Musculoskeletal: No chest wall mass, supraclavicular or axillary adenopathy. The thyroid gland is unremarkable. No worrisome bone lesions to suggest metastatic disease. Advanced degenerative changes involving the right shoulder.   IMPRESSION: 1. 7.1 x 6.0 x 4.4 cm partially necrotic central left lung mass involving both the inferior aspect of the left upper lobe and the superior aspect of the left lower lobe. Findings consistent with a primary neoplasm. Recommend bronchoscopic biopsy. 2. Extensive left hilar adenopathy and borderline enlarged prevascular and subcarinal lymph nodes. 3. No worrisome pulmonary nodules to suggest pulmonary metastatic disease. 4. No findings for upper abdominal metastatic disease or osseous metastatic disease. 5. Age related atherosclerotic calcifications involving the thoracic and abdominal aorta and branch vessels including the coronary arteries. 6. Aortic atherosclerosis.   Aortic Atherosclerosis (ICD10-I70.0).     Electronically Signed   By: Rudie Meyer M.D.   On: 08/02/2022 13:12  ASSESSMENT/PLAN   Non massive hemoptysis and left lung mass     - likely related to underlying lung cancer - oncology is on case appreciate input.    - nebulized tXA TID for now x 2 days   - tissue sampling with bronchoscopy    -patient on room air with no chest pain   - he smoked in the past for 20 years and stopped in 1977.  He had fibrosarcoma in the past in 1977 and (removed right  deltoid)  had radiation in 1977.       Thank you for allowing me to  participate in the care of this patient.   Patient/Family are satisfied with care plan and all questions have been answered.    Provider disclosure: Patient with at least one acute or chronic illness or injury that poses a threat to life or bodily function and is being managed actively during this encounter.  All of the below services have been performed independently by signing provider:  review of prior documentation from internal and or external health records.  Review of previous and current lab results.  Interview and comprehensive assessment during patient visit today. Review of current and previous chest radiographs/CT scans. Discussion of management and test interpretation with health care team and patient/family.   This document was prepared using Dragon voice recognition software and may include unintentional dictation errors.     Ottie Glazier, M.D.  Division of Pulmonary & Critical Care Medicine

## 2022-08-03 ENCOUNTER — Observation Stay: Payer: Medicare Other

## 2022-08-03 DIAGNOSIS — D649 Anemia, unspecified: Secondary | ICD-10-CM | POA: Diagnosis not present

## 2022-08-03 DIAGNOSIS — D491 Neoplasm of unspecified behavior of respiratory system: Secondary | ICD-10-CM | POA: Diagnosis not present

## 2022-08-03 DIAGNOSIS — R042 Hemoptysis: Secondary | ICD-10-CM | POA: Diagnosis not present

## 2022-08-03 DIAGNOSIS — G8929 Other chronic pain: Secondary | ICD-10-CM

## 2022-08-03 DIAGNOSIS — I10 Essential (primary) hypertension: Secondary | ICD-10-CM | POA: Diagnosis not present

## 2022-08-03 LAB — CBC
HCT: 36.9 % — ABNORMAL LOW (ref 39.0–52.0)
Hemoglobin: 12.1 g/dL — ABNORMAL LOW (ref 13.0–17.0)
MCH: 30.5 pg (ref 26.0–34.0)
MCHC: 32.8 g/dL (ref 30.0–36.0)
MCV: 92.9 fL (ref 80.0–100.0)
Platelets: 276 10*3/uL (ref 150–400)
RBC: 3.97 MIL/uL — ABNORMAL LOW (ref 4.22–5.81)
RDW: 12.6 % (ref 11.5–15.5)
WBC: 9.1 10*3/uL (ref 4.0–10.5)
nRBC: 0 % (ref 0.0–0.2)

## 2022-08-03 LAB — BASIC METABOLIC PANEL
Anion gap: 9 (ref 5–15)
BUN: 19 mg/dL (ref 8–23)
CO2: 24 mmol/L (ref 22–32)
Calcium: 9.1 mg/dL (ref 8.9–10.3)
Chloride: 104 mmol/L (ref 98–111)
Creatinine, Ser: 0.62 mg/dL (ref 0.61–1.24)
GFR, Estimated: >60 mL/min (ref >60)
Glucose, Bld: 107 mg/dL — ABNORMAL HIGH (ref 70–99)
Potassium: 4 mmol/L (ref 3.5–5.1)
Sodium: 137 mmol/L (ref 135–145)

## 2022-08-03 NOTE — Progress Notes (Signed)
Mobility Specialist - Progress Note   08/03/22 1424  Mobility  Activity Ambulated independently in hallway  Level of Assistance Independent  Assistive Device Front wheel walker  Distance Ambulated (ft) 1600 ft  Activity Response Tolerated well  Mobility Referral Yes  $Mobility charge 1 Mobility  Mobility Specialist Start Time (ACUTE ONLY) 0152  Mobility Specialist Stop Time (ACUTE ONLY) 0221  Mobility Specialist Time Calculation (min) (ACUTE ONLY) 29 min   Pt OOB in doorway on RA upon arrival. Pt ambulates 10+ laps around NS indep with no LOB noted. Pt does have a forward lean throughout ambulation. Pt left in room with wife and all needs met.   Terrilyn Saver  Mobility Specialist  08/03/22 2:33 PM

## 2022-08-03 NOTE — Progress Notes (Signed)
PULMONOLOGY         Date: 08/03/2022,   MRN# [id number] Wesley Young [birth year]     AdmissionWeight: 74.4 kg                 CurrentWeight: 74.4 kg  Referring provider: Dr Sedalia Muta    CHIEF COMPLAINT:   Non-massive hemoptysis with lung mass   HISTORY OF PRESENT ILLNESS   This is a pleasant 82yo male with hx of hypertension, anxiety, depression, history of alcohol use, history of fibrosarcoma in 1977, and history of melanoma s/p removal (2022), who presents to the emergency department for chief concerns of non massive hemoptysis. Vitals in the ED showed temperature of 98.1, respiration rate of 18, heart rate of 62, blood pressure 140/95, SpO2 of 99% on room air.  Serum sodium is 135, potassium 4.6, chloride 103, bicarb 25, BUN of 17, serum creatinine of 0.65, EGFR greater than 60, nonfasting blood glucose 125, WBC 9.7, hemoglobin 12.5, platelets of 275.   CT of the chest with contrast: Was read as 7.1 x 6.0 x 4.4 cm partially necrotic central left lung mass involving the inferior aspect of the left upper lobe and superior aspect of the left lower lobe.  Findings consistent with primary neoplasm.  Aortic atherosclerosis.  No worrisome pulmonary nodules to suggest pulmonary metastatic disease. PCCM consultation for further evaluation and management of non massive hemoptysis and left lung mass.   08/03/22- patient stable , walked around with PT/OT.  Plan for bronchoscopy in AM 08/05/22.  NPO at midnight on Thursday.  Off [place] lovenox until dc home. SCD only.    PAST MEDICAL HISTORY   Past Medical History:  Diagnosis Date   Abnormal PSA    s/p post prostate biopsy in the past which was negative   Actinic keratosis 03/14/2007   L ant lat neck at base of neck - bx proven    Actinic keratosis 01/08/2014   R calf - bx proven    Alcoholism (HCC)    with prolonged hospitalization 2009 for withdrawal c/b aspiration pneumonia requiring vent   Anxiety    Atrial fibrillation and flutter  (HCC)    B12 deficiency    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   L upper back paraspinal - excision    Hx of squamous cell carcinoma of skin 2014   multiple sites   Hypertension    Impotence    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HCC)    Sleep apnea    Spinal stenosis    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  L forearm - excision    Squamous cell carcinoma of skin 12/16/2015   R dorsum hand    Squamous cell carcinoma of skin 10/02/2017   R dorsum hand    Varicose veins of both lower extremities      SURGICAL HISTORY   Past Surgical History:  Procedure Laterality Date   CATARACT EXTRACTION W/PHACO Left 03/12/2019   Procedure: CATARACT EXTRACTION PHACO AND INTRAOCULAR LENS PLACEMENT (IOC) LEFT 5.27 00:38.6;  Surgeon: Galen Manila, MD;  Location: [facility];  Service: Ophthalmology;  Laterality: Left;  sleep apnea   CATARACT EXTRACTION W/PHACO Right 04/09/2019   Procedure: CATARACT EXTRACTION PHACO AND INTRAOCULAR LENS PLACEMENT (IOC) RIGHT 4.84 00:52.4;  Surgeon: Galen Manila, MD;  Location: [facility];  Service: Ophthalmology;  Laterality: Right;   colonoscopy with polypectomy  12/2016    COLONOSCOPY WITH PROPOFOL N/A 12/27/2016   Procedure: COLONOSCOPY WITH PROPOFOL;  Surgeon: Toledo, Boykin Nearing, MD;  Location: [facility];  Service: Endoscopy;  Laterality: N/A;   COLONOSCOPY WITH PROPOFOL N/A 03/23/2020   Procedure: COLONOSCOPY WITH PROPOFOL;  Surgeon: Toledo, Boykin Nearing, MD;  Location: [facility];  Service: Gastroenterology;  Laterality: N/A;   ESOPHAGOGASTRODUODENOSCOPY (EGD) WITH PROPOFOL N/A 09/21/2017   Procedure: ESOPHAGOGASTRODUODENOSCOPY (EGD) WITH PROPOFOL;  Surgeon: Christena Deem, MD;  Location: [facility];  Service: Endoscopy;  Laterality: N/A;   ESOPHAGOGASTRODUODENOSCOPY (EGD) WITH PROPOFOL N/A 03/23/2020   Procedure: ESOPHAGOGASTRODUODENOSCOPY (EGD) WITH PROPOFOL;  Surgeon: Toledo, Boykin Nearing, MD;  Location: [facility];  Service: Gastroenterology;  Laterality: N/A;   melonoma removal     prostat biopsy x2     right deltoid resection in 1977     s/p resection of his right deltoid muscle in 1977     TONSILLECTOMY     TONSILLECTOMY AND ADENOIDECTOMY       FAMILY HISTORY   Family History  Problem Relation Age of Onset   Ovarian cancer Mother    Heart attack Father    Obesity Paternal Uncle      SOCIAL HISTORY   Social History   Tobacco Use   Smoking status: Former    Types: Cigarettes    Quit date: 1977    Years since quitting: 47.4   Smokeless tobacco: Never  Vaping Use   Vaping Use: Never used  Substance Use Topics   Alcohol use: Not Currently    Comment: quit 03/26/2008   Drug use: Not Currently     MEDICATIONS    Home Medication:     Current Medication:  Current Facility-Administered Medications:    acetaminophen (TYLENOL) tablet 650 mg, 650 mg, Oral, Q6H PRN **OR** acetaminophen (TYLENOL) suppository 650 mg, 650 mg, Rectal, Q6H PRN, Cox, Amy N, DO   donepezil (ARICEPT) tablet 5 mg, 5 mg, Oral, q AM, Cox, Amy N, DO, 5 mg at 08/03/22 0914   finasteride (PROSCAR) tablet 5 mg, 5 mg, Oral, Daily, Cox, Amy N, DO, 5 mg at  08/03/22 0914   heparin injection 5,000 Units, 5,000 Units, Subcutaneous, Q8H, Cox, Amy N, DO, 5,000 Units at 08/03/22 1350   hydrALAZINE (APRESOLINE) injection 5 mg, 5 mg, Intravenous, Q6H PRN, Cox, Amy N, DO   losartan (COZAAR) tablet 50 mg, 50 mg, Oral, Daily, Cox, Amy N, DO, 50 mg at 08/03/22 0914   melatonin tablet 5 mg, 5 mg, Oral, QHS PRN, Cox, Amy N, DO   ondansetron (ZOFRAN) tablet 4 mg, 4 mg, Oral, Q6H PRN **OR** ondansetron (ZOFRAN) injection 4 mg, 4 mg, Intravenous, Q6H PRN, Cox,  Amy N, DO   pramipexole (MIRAPEX) tablet 0.5 mg, 0.5 mg, Oral, QHS, Cox, Amy N, DO, 0.5 mg at 08/02/22 2214   senna-docusate (Senokot-S) tablet 1 tablet, 1 tablet, Oral, QHS PRN, Cox, Amy N, DO   tamsulosin (FLOMAX) capsule 0.4 mg, 0.4 mg, Oral, Daily, Cox, Amy N, DO, 0.4 mg at 08/03/22 0914   traMADol (ULTRAM) tablet 50 mg, 50 mg, Oral, Q6H PRN, Cox, Amy N, DO, 50 mg at 08/03/22 1610    ALLERGIES   Ativan [lorazepam], Benadryl [diphenhydramine], Crestor [rosuvastatin calcium], Nsaids, Rapaflo [silodosin], Statins, Zetia [ezetimibe], Latex, and Neosporin [neomycin-bacitracin zn-polymyx]     REVIEW OF SYSTEMS    Review of Systems:  Gen:  Denies  fever, sweats, chills weigh loss  HEENT: Denies blurred vision, double vision, ear pain, eye pain, hearing loss, nose bleeds, sore throat Cardiac:  No dizziness, chest pain or heaviness, chest tightness,edema Resp:   reports dyspnea chronically  Gi: Denies swallowing difficulty, stomach pain, nausea or vomiting, diarrhea, constipation, bowel incontinence Gu:  Denies bladder incontinence, burning urine Ext:   Denies Joint pain, stiffness or swelling Skin: Denies  skin rash, easy bruising or bleeding or hives Endoc:  Denies polyuria, polydipsia , polyphagia or weight change Psych:   Denies depression, insomnia or hallucinations   Other:  All other systems negative   VS: BP 97/60 (BP Location: Left Arm)   Pulse 76   Temp 97.7 F (36.5 C)   Resp 18    Ht 6' (1.829 m)   Wt 74.4 kg   SpO2 100%   BMI 22.24 kg/m      PHYSICAL EXAM    GENERAL:NAD, no fevers, chills, no weakness no fatigue HEAD: Normocephalic, atraumatic.  EYES: Pupils equal, round, reactive to light. Extraocular muscles intact. No scleral icterus.  MOUTH: Moist mucosal membrane. Dentition intact. No abscess noted.  EAR, NOSE, THROAT: Clear without exudates. No external lesions.  NECK: Supple. No thyromegaly. No nodules. No JVD.  PULMONARY: decreased breath sounds with mild rhonchi worse at bases bilaterally.  CARDIOVASCULAR: S1 and S2. Regular rate and rhythm. No murmurs, rubs, or gallops. No edema. Pedal pulses 2+ bilaterally.  GASTROINTESTINAL: Soft, nontender, nondistended. No masses. Positive bowel sounds. No hepatosplenomegaly.  MUSCULOSKELETAL: No swelling, clubbing, or edema. Range of motion full in all extremities.  NEUROLOGIC: Cranial nerves II through XII are intact. No gross focal neurological deficits. Sensation intact. Reflexes intact.  SKIN: No ulceration, lesions, rashes, or cyanosis. Skin warm and dry. Turgor intact.  PSYCHIATRIC: Mood, affect within normal limits. The patient is awake, alert and oriented x 3. Insight, judgment intact.       IMAGING   CLINICAL DATA:  Followup abnormal chest x-ray.   EXAM: CT CHEST WITH CONTRAST   TECHNIQUE: Multidetector CT imaging of the chest was performed during intravenous contrast administration.   RADIATION DOSE REDUCTION: This exam was performed according to the departmental dose-optimization program which includes automated exposure control, adjustment of the mA and/or kV according to patient size and/or use of iterative reconstruction technique.   CONTRAST:  75mL OMNIPAQUE IOHEXOL 300 MG/ML  SOLN   COMPARISON:  Chest x-ray, same date.   FINDINGS: Cardiovascular: The heart is normal in size. No pericardial effusion. Age related atherosclerotic calcifications involving the aorta but no  aneurysm or dissection. The branch vessels are patent. Three-vessel coronary artery calcifications are noted.   Mediastinum/Nodes: Extensive left hilar adenopathy. There is also a borderline enlarged prevascular node measuring 9 mm on image 82/2. Left-sided subcarinal node measures  11 mm on image 88/2. No contralateral mediastinal adenopathy. The esophagus is grossly.   Lungs/Pleura: Large partially necrotic central left lung mass involving both the inferior aspect of the left upper lobe and the superior aspect of the left lower lobe. It measures approximately 7.1 x 6.0 x 4.4 cm. It surrounds the descending thoracic aorta. Findings consistent with a primary neoplasm. Recommend bronchoscopic biopsy. Interstitial changes surrounding lesion peripherally in the left lower lobe could be obstructive pneumonitis and atelectasis. No worrisome pulmonary nodules to suggest pulmonary metastatic disease. No pleural effusions or pleural nodules.   Upper Abdomen: No significant upper abdominal findings. No hepatic or adrenal gland lesions. No upper abdominal adenopathy. Simple 6 cm nonenhancing left renal cyst not requiring any further imaging evaluation or follow-up. Moderate age related atherosclerotic calcifications involving the abdominal aorta and branch vessels.   Musculoskeletal: No chest wall mass, supraclavicular or axillary adenopathy. The thyroid gland is unremarkable. No worrisome bone lesions to suggest metastatic disease. Advanced degenerative changes involving the right shoulder.   IMPRESSION: 1. 7.1 x 6.0 x 4.4 cm partially necrotic central left lung mass involving both the inferior aspect of the left upper lobe and the superior aspect of the left lower lobe. Findings consistent with a primary neoplasm. Recommend bronchoscopic biopsy. 2. Extensive left hilar adenopathy and borderline enlarged prevascular and subcarinal lymph nodes. 3. No worrisome pulmonary nodules to suggest  pulmonary metastatic disease. 4. No findings for upper abdominal metastatic disease or osseous metastatic disease. 5. Age related atherosclerotic calcifications involving the thoracic and abdominal aorta and branch vessels including the coronary arteries. 6. Aortic atherosclerosis.   Aortic Atherosclerosis (ICD10-I70.0).     Electronically Signed   By: Rudie Meyer M.D.   On: 08/02/2022 13:12  ASSESSMENT/PLAN   Non massive hemoptysis and left lung mass     - likely related to underlying lung cancer - oncology is on case appreciate input.    - nebulized tXA TID for now x 2 days   - tissue sampling with bronchoscopy    -patient on room air with no chest pain   - he smoked in the past for 20 years and stopped in 1977.  He had fibrosarcoma in the past in 1977 and (removed right deltoid)  had radiation in 1977.   -he on on lovenox and we can hold that until dc home.     Thank you for allowing me to participate in the care of this patient.   Patient/Family are satisfied with care plan and all questions have been answered.    Provider disclosure: Patient with at least one acute or chronic illness or injury that poses a threat to life or bodily function and is being managed actively during this encounter.  All of the below services have been performed independently by signing provider:  review of prior documentation from internal and or external health records.  Review of previous and current lab results.  Interview and comprehensive assessment during patient visit today. Review of current and previous chest radiographs/CT scans. Discussion of management and test interpretation with health care team and patient/family.   This document was prepared using Dragon voice recognition software and may include unintentional dictation errors.     Vida Rigger, M.D.  Division of Pulmonary & Critical Care Medicine

## 2022-08-03 NOTE — Progress Notes (Signed)
Mobility Specialist - Progress Note   08/03/22 0901  Mobility  Activity Ambulated with assistance in hallway  Level of Assistance Standby assist, set-up cues, supervision of patient - no hands on  Assistive Device Front wheel walker  Distance Ambulated (ft) 160 ft  Activity Response Tolerated well  Mobility Referral Yes  $Mobility charge 1 Mobility  Mobility Specialist Start Time (ACUTE ONLY) [id number]  Mobility Specialist Stop Time (ACUTE ONLY) [id number]  Mobility Specialist Time Calculation (min) (ACUTE ONLY) 8 min   Pt OOB on RA in hallway upon arrival. PT ambulates back to room Supervision with no LOB noted. Pt left EOB with needs in reach.   Terrilyn Saver  Mobility Specialist  08/03/22 9:02 AM

## 2022-08-03 NOTE — Progress Notes (Signed)
Progress Note   Patient: Wesley Young:[id number] DOB: Jun 26, 1940 DOA: 08/02/2022     0 DOS: the patient was seen and examined on 08/03/2022   Brief hospital course: [name] is an 82 year old male with history of hypertension, anxiety, depression, history of alcohol use, history of fibrosarcoma in 1977, and history of melanoma s/p removal (2022), who presents to the emergency department for chief concerns of coughing up blood  Vitals in the ED showed temperature of 98.1, respiration rate of 18, heart rate of 62, blood pressure 140/95, SpO2 of 99% on room air.  Serum sodium is 135, potassium 4.6, chloride 103, bicarb 25, BUN of 17, serum creatinine of 0.65, EGFR greater than 60, nonfasting blood glucose 125, WBC 9.7, hemoglobin 12.5, platelets of 275.  CT of the chest with contrast: Was read as 7.1 x 6.0 x 4.4 cm partially necrotic central left lung mass involving the inferior aspect of the left upper lobe and superior aspect of the left lower lobe.  Findings consistent with primary neoplasm.  Aortic atherosclerosis.  No worrisome pulmonary nodules to suggest pulmonary metastatic disease.  6/12: Pulmonary and oncology was consulted and patient will have his bronchoscopy on Friday.  Hemoglobin at 12.1.  CT abdomen and pelvis was negative for any evidence of metastatic disease.    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 Cathie Hoops who recommends hospitalist admission for further workup CT of the abdomen and pelvis was negative for any metastatic disease Pulmonology service, Dr. Karna Christmas has been consulted  -Going for bronchoscopy on Friday morning -Further treatment plan based on pathology.  Mild anemia Suspect secondary to  hemoptysis in setting of new pulmonary neoplasm diagnosis, continue monitoring Repeat CBC in a.m.  Essential hypertension Resumed home losartan 50 mg daily Hydralazine 5 mg IV every 6 hours as needed for SBP greater 175, 4 days ordered  Chronic pain Resumed home tramadol 50 mg every 6 hours as needed for moderate pain -Continue home pramipexole for restless leg  OSA on CPAP CPAP nightly ordered   Subjective: Patient was seen and examined today.  No more significant hemoptysis at this time.  Physical Exam: Vitals:   08/03/22 0426 08/03/22 0804 08/03/22 0804 08/03/22 1142  BP: (!) 152/76 (!) 143/83 (!) 143/83 97/60  Pulse: 72 81 81 76  Resp: 18 18 18 18   Temp: (!) 97.5 F (36.4 C) 97.8 F (36.6 C) 97.8 F (36.6 C) 97.7 F (36.5 C)  TempSrc:      SpO2: 100% 100% 100% 100%  Weight:      Height:       General.  Frail elderly man, in no acute distress. Pulmonary.  Lungs clear bilaterally, normal respiratory effort. CV.  Regular rate and rhythm, no JVD, rub or murmur. Abdomen.  Soft, nontender, nondistended, BS positive. CNS.  Alert and oriented .  No focal neurologic deficit. Extremities.  No edema, no cyanosis, pulses intact and symmetrical. Psychiatry.  Judgment and insight appears normal.   Data Reviewed: Prior data reviewed  Family Communication: Unable to reach wife on phone.  Disposition: Status is: Observation The patient remains OBS appropriate and will d/c before 2 midnights.  Planned Discharge Destination: Home  DVT prophylaxis.  Heparin Time spent: 50 minutes  This record has been created using Conservation officer, historic buildings.  Errors have been sought and corrected,but may not always be located. Such creation errors do not reflect on the standard of care.   Author: Arnetha Courser, MD 08/03/2022 3:46 PM  For on call review [email or web address].

## 2022-08-04 ENCOUNTER — Observation Stay: Payer: Medicare Other

## 2022-08-04 DIAGNOSIS — I10 Essential (primary) hypertension: Secondary | ICD-10-CM | POA: Diagnosis not present

## 2022-08-04 DIAGNOSIS — D491 Neoplasm of unspecified behavior of respiratory system: Secondary | ICD-10-CM | POA: Diagnosis not present

## 2022-08-04 DIAGNOSIS — R042 Hemoptysis: Secondary | ICD-10-CM | POA: Diagnosis not present

## 2022-08-04 DIAGNOSIS — D649 Anemia, unspecified: Secondary | ICD-10-CM | POA: Diagnosis not present

## 2022-08-04 LAB — CBC
HCT: 35.2 % — ABNORMAL LOW (ref 39.0–52.0)
Hemoglobin: 11.6 g/dL — ABNORMAL LOW (ref 13.0–17.0)
MCH: 30.4 pg (ref 26.0–34.0)
MCHC: 33 g/dL (ref 30.0–36.0)
MCV: 92.4 fL (ref 80.0–100.0)
Platelets: 270 10*3/uL (ref 150–400)
RBC: 3.81 MIL/uL — ABNORMAL LOW (ref 4.22–5.81)
RDW: 12.7 % (ref 11.5–15.5)
WBC: 7.7 10*3/uL (ref 4.0–10.5)
nRBC: 0 % (ref 0.0–0.2)

## 2022-08-04 NOTE — Progress Notes (Signed)
Patient is alert and oriented forgetful.Need frequent reminder. Complain of chronic back pain takes tramadol at home. Tramadol q6. Effective. Blood pressure was soft. BP Meds hold. Independent in room. Ambulating. Chest CT scan  ordered. Patient will be npo at midnight for biopsy via bronchoscopy.

## 2022-08-04 NOTE — Progress Notes (Signed)
PULMONOLOGY         Date: 08/04/2022,   MRN# [id number] HOYE VANDERMEER 05/19/1940     AdmissionWeight: 74.4 kg                 CurrentWeight: 74.4 kg  Referring provider: Dr Sedalia Muta    CHIEF COMPLAINT:   Non-massive hemoptysis with lung mass   HISTORY OF PRESENT ILLNESS   This is a pleasant 82yo male with hx of hypertension, anxiety, depression, history of alcohol use, history of fibrosarcoma in 1977, and history of melanoma s/p removal (2022), who presents to the emergency department for chief concerns of non massive hemoptysis. Vitals in the ED showed temperature of 98.1, respiration rate of 18, heart rate of 62, blood pressure 140/95, SpO2 of 99% on room air.  Serum sodium is 135, potassium 4.6, chloride 103, bicarb 25, BUN of 17, serum creatinine of 0.65, EGFR greater than 60, nonfasting blood glucose 125, WBC 9.7, hemoglobin 12.5, platelets of 275.   CT of the chest with contrast: Was read as 7.1 x 6.0 x 4.4 cm partially necrotic central left lung mass involving the inferior aspect of the left upper lobe and superior aspect of the left lower lobe.  Findings consistent with primary neoplasm.  Aortic atherosclerosis.  No worrisome pulmonary nodules to suggest pulmonary metastatic disease. PCCM consultation for further evaluation and management of non massive hemoptysis and left lung mass.   08/03/22- patient stable , walked around with PT/OT.  Plan for bronchoscopy in AM 08/05/22.  NPO at midnight on Thursday.  Off [place] lovenox until dc home. SCD only.   08/04/22- Patient with no overnight events. [facility] pilot CT study is done. Plan for bronch in am.      PAST MEDICAL HISTORY   Past Medical History:  Diagnosis Date   Abnormal PSA    s/p post prostate biopsy in the past which was negative   Actinic keratosis 03/14/2007   L ant lat neck at base of neck - bx proven    Actinic keratosis 01/08/2014   R calf - bx proven    Alcoholism (HCC)    with prolonged hospitalization 2009  for withdrawal c/b aspiration pneumonia requiring vent   Anxiety    Atrial fibrillation and flutter (HCC)    B12 deficiency    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   L upper back paraspinal - excision    Hx of squamous cell carcinoma of skin 2014   multiple sites   Hypertension    Impotence    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[facility])    Sleep apnea    Spinal stenosis    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   L forearm - excision    Squamous cell carcinoma of skin 12/16/2015   R dorsum hand    Squamous cell carcinoma of skin 10/02/2017   R dorsum hand    Varicose veins of both lower extremities      SURGICAL HISTORY   Past Surgical History:  Procedure Laterality Date   CATARACT EXTRACTION W/PHACO Left 03/12/2019   Procedure: CATARACT EXTRACTION PHACO AND INTRAOCULAR LENS PLACEMENT (IOC) LEFT 5.27 00:38.6;  Surgeon: Galen Manila, MD;  Location: [facility];  Service: Ophthalmology;  Laterality: Left;  sleep apnea   CATARACT EXTRACTION W/PHACO Right 04/09/2019   Procedure: CATARACT EXTRACTION PHACO AND INTRAOCULAR LENS PLACEMENT (IOC) RIGHT 4.84 00:52.4;  Surgeon: Galen Manila, MD;  Location: [facility];  Service: Ophthalmology;  Laterality: Right;   colonoscopy with polypectomy  12/2016   COLONOSCOPY WITH PROPOFOL N/A 12/27/2016   Procedure: COLONOSCOPY WITH PROPOFOL;  Surgeon: Toledo, Boykin Nearing, MD;  Location: [facility];  Service: Endoscopy;  Laterality: N/A;   COLONOSCOPY WITH PROPOFOL N/A 03/23/2020   Procedure: COLONOSCOPY WITH PROPOFOL;  Surgeon: Toledo, Boykin Nearing, MD;  Location: [facility] END[facility]COPY;  Service: Gastroenterology;  Laterality: N/A;   ESOPHAGOGASTRODUODENOSCOPY (EGD) WITH PROPOFOL N/A 09/21/2017   Procedure: ESOPHAGOGASTRODUODENOSCOPY (EGD) WITH PROPOFOL;  Surgeon: Christena Deem, MD;  Location: [facility]COPY;  Service: Endoscopy;  Laterality: N/A;   ESOPHAGOGASTRODUODENOSCOPY (EGD) WITH PROPOFOL N/A 03/23/2020   Procedure: ESOPHAGOGASTRODUODENOSCOPY (EGD) WITH PROPOFOL;  Surgeon: Toledo, Boykin Nearing, MD;  Location: [facility] ENDOSCOPY;  Service: Gastroenterology;  Laterality: N/A;   melonoma removal     prostat biopsy x2     right deltoid resection in 1977     s/p resection of his right deltoid muscle in 1977     TONSILLECTOMY     TONSILLECTOMY AND ADENOIDECTOMY       FAMILY HISTORY   Family History  Problem Relation Age of Onset   Ovarian cancer Mother    Heart attack Father    Obesity Paternal Uncle      SOCIAL HISTORY   Social History   Tobacco Use   Smoking status: Former    Types: Cigarettes    Quit date: 1977    Years since quitting: 47.4   Smokeless tobacco: Never  Vaping Use   Vaping Use: Never used  Substance Use Topics   Alcohol use: Not Currently    Comment: quit 03/26/2008   Drug use: Not Currently     MEDICATIONS    Home Medication:     Current Medication:  Current Facility-Administered Medications:    acetaminophen (TYLENOL) tablet 650 mg, 650 mg, Oral, Q6H PRN **OR** acetaminophen (TYLENOL) suppository 650 mg, 650 mg, Rectal, Q6H PRN, Cox, Amy N, DO   donepezil (ARICEPT) tablet 5 mg, 5 mg, Oral, q AM, Cox, Amy N, DO, 5 mg at  08/04/22 0815   finasteride (PROSCAR) tablet 5 mg, 5 mg, Oral, Daily, Cox, Amy N, DO, 5 mg at 08/04/22 0816   hydrALAZINE (APRESOLINE) injection 5 mg, 5 mg, Intravenous, Q6H PRN, Cox, Amy N, DO   losartan (COZAAR) tablet 50 mg, 50 mg, Oral, Daily, Cox, Amy N, DO, 50 mg at 08/03/22 0914   melatonin tablet 5 mg, 5 mg, Oral, QHS PRN, Cox, Amy N, DO   ondansetron (ZOFRAN) tablet 4 mg, 4 mg, Oral, Q6H PRN **OR** ondansetron (ZOFRAN) injection 4 mg, 4 mg,  Intravenous, Q6H PRN, Cox, Amy N, DO   pramipexole (MIRAPEX) tablet 0.5 mg, 0.5 mg, Oral, QHS, Cox, Amy N, DO, 0.5 mg at 08/03/22 2139   senna-docusate (Senokot-S) tablet 1 tablet, 1 tablet, Oral, QHS PRN, Cox, Amy N, DO   tamsulosin (FLOMAX) capsule 0.4 mg, 0.4 mg, Oral, Daily, Cox, Amy N, DO, 0.4 mg at 08/04/22 0816   traMADol (ULTRAM) tablet 50 mg, 50 mg, Oral, Q6H PRN, Cox, Amy N, DO, 50 mg at 08/04/22 0442    ALLERGIES   Ativan [lorazepam], Benadryl [diphenhydramine], Crestor [rosuvastatin calcium], Nsaids, Rapaflo [silodosin], Statins, Zetia [ezetimibe], Latex, and Neosporin [neomycin-bacitracin zn-polymyx]     REVIEW OF SYSTEMS    Review of Systems:  Gen:  Denies  fever, sweats, chills weigh loss  HEENT: Denies blurred vision, double vision, ear pain, eye pain, hearing loss, nose bleeds, sore throat Cardiac:  No dizziness, chest pain or heaviness, chest tightness,edema Resp:   reports dyspnea chronically  Gi: Denies swallowing difficulty, stomach pain, nausea or vomiting, diarrhea, constipation, bowel incontinence Gu:  Denies bladder incontinence, burning urine Ext:   Denies Joint pain, stiffness or swelling Skin: Denies  skin rash, easy bruising or bleeding or hives Endoc:  Denies polyuria, polydipsia , polyphagia or weight change Psych:   Denies depression, insomnia or hallucinations   Other:  All other systems negative   VS: BP (!) 105/50 (BP Location: Left Arm)   Pulse (!) 49   Temp 97.7 F (36.5 C)   Resp 16   Ht 6'  (1.829 m)   Wt 74.4 kg   SpO2 100%   BMI 22.24 kg/m      PHYSICAL EXAM    GENERAL:NAD, no fevers, chills, no weakness no fatigue HEAD: Normocephalic, atraumatic.  EYES: Pupils equal, round, reactive to light. Extraocular muscles intact. No scleral icterus.  MOUTH: Moist mucosal membrane. Dentition intact. No abscess noted.  EAR, NOSE, THROAT: Clear without exudates. No external lesions.  NECK: Supple. No thyromegaly. No nodules. No JVD.  PULMONARY: decreased breath sounds with mild rhonchi worse at bases bilaterally.  CARDIOVASCULAR: S1 and S2. Regular rate and rhythm. No murmurs, rubs, or gallops. No edema. Pedal pulses 2+ bilaterally.  GASTROINTESTINAL: Soft, nontender, nondistended. No masses. Positive bowel sounds. No hepatosplenomegaly.  MUSCULOSKELETAL: No swelling, clubbing, or edema. Range of motion full in all extremities.  NEUROLOGIC: Cranial nerves II through XII are intact. No gross focal neurological deficits. Sensation intact. Reflexes intact.  SKIN: No ulceration, lesions, rashes, or cyanosis. Skin warm and dry. Turgor intact.  PSYCHIATRIC: Mood, affect within normal limits. The patient is awake, alert and oriented x 3. Insight, judgment intact.       IMAGING   CLINICAL DATA:  Followup abnormal chest x-ray.   EXAM: CT CHEST WITH CONTRAST   TECHNIQUE: Multidetector CT imaging of the chest was performed during intravenous contrast administration.   RADIATION DOSE REDUCTION: This exam was performed according to the departmental dose-optimization program which includes automated exposure control, adjustment of the mA and/or kV according to patient size and/or use of iterative reconstruction technique.   CONTRAST:  75mL OMNIPAQUE IOHEXOL 300 MG/ML  SOLN   COMPARISON:  Chest x-ray, same date.   FINDINGS: Cardiovascular: The heart is normal in size. No pericardial effusion. Age related atherosclerotic calcifications involving the aorta but no aneurysm or  dissection. The branch vessels are patent. Three-vessel coronary artery calcifications are noted.   Mediastinum/Nodes: Extensive left hilar adenopathy. There is also a borderline enlarged prevascular node measuring 9 mm on  image 82/2. Left-sided subcarinal node measures 11 mm on image 88/2. No contralateral mediastinal adenopathy. The esophagus is grossly.   Lungs/Pleura: Large partially necrotic central left lung mass involving both the inferior aspect of the left upper lobe and the superior aspect of the left lower lobe. It measures approximately 7.1 x 6.0 x 4.4 cm. It surrounds the descending thoracic aorta. Findings consistent with a primary neoplasm. Recommend bronchoscopic biopsy. Interstitial changes surrounding lesion peripherally in the left lower lobe could be obstructive pneumonitis and atelectasis. No worrisome pulmonary nodules to suggest pulmonary metastatic disease. No pleural effusions or pleural nodules.   Upper Abdomen: No significant upper abdominal findings. No hepatic or adrenal gland lesions. No upper abdominal adenopathy. Simple 6 cm nonenhancing left renal cyst not requiring any further imaging evaluation or follow-up. Moderate age related atherosclerotic calcifications involving the abdominal aorta and branch vessels.   Musculoskeletal: No chest wall mass, supraclavicular or axillary adenopathy. The thyroid gland is unremarkable. No worrisome bone lesions to suggest metastatic disease. Advanced degenerative changes involving the right shoulder.   IMPRESSION: 1. 7.1 x 6.0 x 4.4 cm partially necrotic central left lung mass involving both the inferior aspect of the left upper lobe and the superior aspect of the left lower lobe. Findings consistent with a primary neoplasm. Recommend bronchoscopic biopsy. 2. Extensive left hilar adenopathy and borderline enlarged prevascular and subcarinal lymph nodes. 3. No worrisome pulmonary nodules to suggest pulmonary  metastatic disease. 4. No findings for upper abdominal metastatic disease or osseous metastatic disease. 5. Age related atherosclerotic calcifications involving the thoracic and abdominal aorta and branch vessels including the coronary arteries. 6. Aortic atherosclerosis.   Aortic Atherosclerosis (ICD10-I70.0).     Electronically Signed   By: Rudie Meyer M.D.   On: 08/02/2022 13:12  ASSESSMENT/PLAN   Non massive hemoptysis and left lung mass     - likely related to underlying lung cancer - oncology is on case appreciate input.    - nebulized tXA TID for now x 2 days   - tissue sampling with bronchoscopy    -patient on room air with no chest pain   - he smoked in the past for 20 years and stopped in 1977.  He had fibrosarcoma in the past in 1977 and (removed right deltoid)  had radiation in 1977.   -he on on lovenox and we can hold that until dc home.     Thank you for allowing me to participate in the care of this patient.   Patient/Family are satisfied with care plan and all questions have been answered.    Provider disclosure: Patient with at least one acute or chronic illness or injury that poses a threat to life or bodily function and is being managed actively during this encounter.  All of the below services have been performed independently by signing provider:  review of prior documentation from internal and or external health records.  Review of previous and current lab results.  Interview and comprehensive assessment during patient visit today. Review of current and previous chest radiographs/CT scans. Discussion of management and test interpretation with health care team and patient/family.   This document was prepared using Dragon voice recognition software and may include unintentional dictation errors.     Vida Rigger, M.D.  Division of Pulmonary & Critical Care Medicine

## 2022-08-04 NOTE — Care Management Obs Status (Signed)
MEDICARE OBSERVATION STATUS NOTIFICATION   Patient Details  Name: Wesley Young MRN: [id number] Date of Birth: [birth year]   Medicare Observation Status Notification Given:  Yes (Pt refused to sign)    Allena Katz, LCSW 08/04/2022, 9:31 AM

## 2022-08-04 NOTE — Progress Notes (Addendum)
Progress Note   Patient: Wesley Young WUJ:[id number] DOB: [birth year] DOA: 08/02/2022     0 DOS: the patient was seen and examined on 08/04/2022   Brief hospital course: Mr. Shyrone Bertsch is an 82 year old male with history of hypertension, anxiety, depression, history of alcohol use, history of fibrosarcoma in 1977, and history of melanoma s/p removal (2022), who presents to the emergency department for chief concerns of coughing up blood  Vitals in the ED showed temperature of 98.1, respiration rate of 18, heart rate of 62, blood pressure 140/95, SpO2 of 99% on room air.  Serum sodium is 135, potassium 4.6, chloride 103, bicarb 25, BUN of 17, serum creatinine of 0.65, EGFR greater than 60, nonfasting blood glucose 125, WBC 9.7, hemoglobin 12.5, platelets of 275.  CT of the chest with contrast: Was read as 7.1 x 6.0 x 4.4 cm partially necrotic central left lung mass involving the inferior aspect of the left upper lobe and superior aspect of the left lower lobe.  Findings consistent with primary neoplasm.  Aortic atherosclerosis.  No worrisome pulmonary nodules to suggest pulmonary metastatic disease.  6/12: Pulmonary and oncology was consulted and patient will have his bronchoscopy on Friday.  Hemoglobin at 12.1.  CT abdomen and pelvis was negative for any evidence of metastatic disease.  6/13: Hemodynamically stable, blood pressure on softer side.  Hemoglobin decreased to 11.6.   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 Cathie Hoops who recommends hospitalist admission for further workup CT of the abdomen and pelvis was negative for any metastatic disease Pulmonology service, Dr. Karna Christmas has been consulted  -Going for bronchoscopy on Friday  morning -Further treatment plan based on pathology.  Mild anemia Suspect secondary to hemoptysis in setting of new pulmonary neoplasm diagnosis, continue monitoring Repeat CBC in a.m.  Essential hypertension Resumed home losartan 50 mg daily Hydralazine 5 mg IV every 6 hours as needed for SBP greater 175, 4 days ordered  Chronic pain Resumed home tramadol 50 mg every 6 hours as needed for moderate pain -Continue home pramipexole for restless leg  OSA on CPAP CPAP nightly ordered   Subjective: Patient was seen and examined today.  No new concern.  No more hemoptysis.  Physical Exam: Vitals:   08/03/22 2026 08/04/22 0005 08/04/22 0451 08/04/22 0800  BP: 127/64 (!) 150/134 (!) 122/49 (!) 105/50  Pulse: (!) 54 (!) 57 (!) 49   Resp: 20 20 18 16   Temp: 99.2 F (37.3 C) 97.7 F (36.5 C) (!) 97.5 F (36.4 C) 97.7 F (36.5 C)  TempSrc: Oral Oral Oral   SpO2: 98% 99% 99% 100%  Weight:      Height:       General.  Pleasant elderly man, in no acute distress. Pulmonary.  Lungs clear bilaterally, normal respiratory effort. CV.  Regular rate and rhythm, no JVD, rub or murmur. Abdomen.  Soft, nontender, nondistended, BS positive. CNS.  Alert and oriented .  No focal neurologic deficit. Extremities.  No edema, no cyanosis, pulses intact and symmetrical. Psychiatry.  Judgment and insight appears normal.   Data Reviewed: Prior data reviewed  Family Communication: Talked with wife on phone.  Disposition: Status is: Observation The patient remains OBS appropriate and will d/c before 2 midnights.  Planned Discharge Destination: Home  DVT prophylaxis.  Heparin  Time spent: 45 minutes  This record has been created using Conservation officer, historic buildings. Errors have been sought and corrected,but may not always be located. Such creation errors do not reflect on the standard of care.   Author: Arnetha Courser, MD 08/04/2022 3:25 PM  For on call review [email or web address].

## 2022-08-05 ENCOUNTER — Encounter
Admission: EM | Disposition: A | Discharge status: Home / Self Care | Payer: Self-pay | Source: Home / Self Care | Attending: Emergency Medicine

## 2022-08-05 ENCOUNTER — Observation Stay: Payer: Medicare Other | Admitting: Registered Nurse

## 2022-08-05 ENCOUNTER — Observation Stay: Payer: Medicare Other

## 2022-08-05 ENCOUNTER — Encounter: Payer: Self-pay | Admitting: Internal Medicine

## 2022-08-05 DIAGNOSIS — R042 Hemoptysis: Secondary | ICD-10-CM | POA: Diagnosis not present

## 2022-08-05 DIAGNOSIS — D491 Neoplasm of unspecified behavior of respiratory system: Secondary | ICD-10-CM | POA: Diagnosis not present

## 2022-08-05 DIAGNOSIS — D649 Anemia, unspecified: Secondary | ICD-10-CM | POA: Diagnosis not present

## 2022-08-05 DIAGNOSIS — I10 Essential (primary) hypertension: Secondary | ICD-10-CM | POA: Diagnosis not present

## 2022-08-05 HISTORY — PX: VIDEO BRONCHOSCOPY WITH ENDOBRONCHIAL ULTRASOUND: SHX6177

## 2022-08-05 SURGERY — BRONCHOSCOPY, WITH BIOPSY USING ELECTROMAGNETIC NAVIGATION
Anesthesia: General

## 2022-08-05 MED ORDER — DEXAMETHASONE SODIUM PHOSPHATE 10 MG/ML IJ SOLN
INTRAMUSCULAR | Status: DC | PRN
  Administered 2022-08-05: 5 mg via INTRAVENOUS

## 2022-08-05 MED ORDER — OXYCODONE HCL 5 MG/5ML PO SOLN: 5.0000 mg | Freq: Once | ORAL | Status: DC | PRN

## 2022-08-05 MED ORDER — EPHEDRINE 5 MG/ML INJ
INTRAVENOUS | Status: AC
  Filled 2022-08-05: qty 5

## 2022-08-05 MED ORDER — VASOPRESSIN 20 UNIT/ML IV SOLN
INTRAVENOUS | Status: DC | PRN
  Administered 2022-08-05 (×3): 1 [IU] via INTRAVENOUS

## 2022-08-05 MED ORDER — PROPOFOL 10 MG/ML IV BOLUS
INTRAVENOUS | Status: AC
  Filled 2022-08-05: qty 20

## 2022-08-05 MED ORDER — BENZONATATE 200 MG PO CAPS: 200.0000 mg | ORAL_CAPSULE | Freq: Three times a day (TID) | ORAL | 0 refills | Status: DC | PRN

## 2022-08-05 MED ORDER — PHENYLEPHRINE HCL-NACL 20-0.9 MG/250ML-% IV SOLN
INTRAVENOUS | Status: DC | PRN
  Administered 2022-08-05: 50 ug/min via INTRAVENOUS

## 2022-08-05 MED ORDER — PHENYLEPHRINE HCL-NACL 20-0.9 MG/250ML-% IV SOLN
INTRAVENOUS | Status: AC
  Filled 2022-08-05: qty 250

## 2022-08-05 MED ORDER — PHENYLEPHRINE HCL (PRESSORS) 10 MG/ML IV SOLN
INTRAVENOUS | Status: DC | PRN
  Administered 2022-08-05 (×2): 160 ug via INTRAVENOUS

## 2022-08-05 MED ORDER — GLYCOPYRROLATE 0.2 MG/ML IJ SOLN
INTRAMUSCULAR | Status: DC | PRN
  Administered 2022-08-05: .2 mg via INTRAVENOUS

## 2022-08-05 MED ORDER — PROPOFOL 10 MG/ML IV BOLUS
INTRAVENOUS | Status: DC | PRN
  Administered 2022-08-05: 150 mg via INTRAVENOUS

## 2022-08-05 MED ORDER — LACTATED RINGERS IV SOLN: INTRAVENOUS | Status: DC | PRN

## 2022-08-05 MED ORDER — FENTANYL CITRATE (PF) 100 MCG/2ML IJ SOLN
25.0000 ug | INTRAMUSCULAR | Status: DC | PRN
  Filled 2022-08-05: qty 1

## 2022-08-05 MED ORDER — EPHEDRINE SULFATE (PRESSORS) 50 MG/ML IJ SOLN
INTRAMUSCULAR | Status: DC | PRN
  Administered 2022-08-05 (×2): 12.5 mg via INTRAVENOUS

## 2022-08-05 MED ORDER — ROCURONIUM BROMIDE 10 MG/ML (PF) SYRINGE
PREFILLED_SYRINGE | INTRAVENOUS | Status: AC
  Filled 2022-08-05: qty 10

## 2022-08-05 MED ORDER — GLYCOPYRROLATE 0.2 MG/ML IJ SOLN
INTRAMUSCULAR | Status: AC
  Filled 2022-08-05: qty 1

## 2022-08-05 MED ORDER — DEXAMETHASONE SODIUM PHOSPHATE 10 MG/ML IJ SOLN
INTRAMUSCULAR | Status: AC
  Filled 2022-08-05: qty 1

## 2022-08-05 MED ORDER — ONDANSETRON HCL 4 MG/2ML IJ SOLN
INTRAMUSCULAR | Status: AC
  Filled 2022-08-05: qty 2

## 2022-08-05 MED ORDER — BENZONATATE 100 MG PO CAPS
200.0000 mg | ORAL_CAPSULE | Freq: Three times a day (TID) | ORAL | Status: DC | PRN
  Administered 2022-08-05: 200 mg via ORAL
  Filled 2022-08-05: qty 2

## 2022-08-05 MED ORDER — FENTANYL CITRATE (PF) 100 MCG/2ML IJ SOLN
INTRAMUSCULAR | Status: DC | PRN
  Administered 2022-08-05 (×2): 25 ug via INTRAVENOUS
  Administered 2022-08-05: 50 ug via INTRAVENOUS

## 2022-08-05 MED ORDER — ROCURONIUM BROMIDE 100 MG/10ML IV SOLN
INTRAVENOUS | Status: DC | PRN
  Administered 2022-08-05: 60 mg via INTRAVENOUS
  Administered 2022-08-05: 10 mg via INTRAVENOUS

## 2022-08-05 MED ORDER — HYDROCOD POLI-CHLORPHE POLI ER 10-8 MG/5ML PO SUER: 5.0000 mL | Freq: Two times a day (BID) | ORAL | Status: DC | PRN

## 2022-08-05 MED ORDER — LIDOCAINE HCL (CARDIAC) PF 100 MG/5ML IV SOSY
PREFILLED_SYRINGE | INTRAVENOUS | Status: DC | PRN
  Administered 2022-08-05: 80 mg via INTRAVENOUS

## 2022-08-05 MED ORDER — SUGAMMADEX SODIUM 200 MG/2ML IV SOLN
INTRAVENOUS | Status: DC | PRN
  Administered 2022-08-05: 200 mg via INTRAVENOUS

## 2022-08-05 MED ORDER — VASOPRESSIN 20 UNIT/ML IV SOLN
INTRAVENOUS | Status: AC
  Filled 2022-08-05: qty 1

## 2022-08-05 MED ORDER — ONDANSETRON HCL 4 MG/2ML IJ SOLN
INTRAMUSCULAR | Status: DC | PRN
  Administered 2022-08-05: 4 mg via INTRAVENOUS

## 2022-08-05 MED ORDER — FENTANYL CITRATE (PF) 100 MCG/2ML IJ SOLN
INTRAMUSCULAR | Status: AC
  Filled 2022-08-05: qty 2

## 2022-08-05 MED ORDER — OXYCODONE HCL 5 MG PO TABS: 5.0000 mg | ORAL_TABLET | Freq: Once | ORAL | Status: DC | PRN

## 2022-08-05 NOTE — Anesthesia Preprocedure Evaluation (Addendum)
Anesthesia Evaluation  Patient identified by MRN, date of birth, ID band Patient awake    Reviewed: Allergy & Precautions, NPO status , Patient's Chart, lab work & pertinent test results  History of Anesthesia Complications Negative for: history of anesthetic complications  Airway Mallampati: III  TM Distance: >3 FB Neck ROM: full    Dental  (+) Dental Advidsory Given   Pulmonary sleep apnea and Continuous Positive Airway Pressure Ventilation , neg COPD, Not current smoker, former smoker   Pulmonary exam normal        Cardiovascular hypertension, Pt. on medications +CHF (with EToH abuse, resolved)  (-) Past MI negative cardio ROS Normal cardiovascular exam+ dysrhythmias (irregular beats, but sinus) (-) Valvular Problems/Murmurs     Neuro/Psych neg Seizures  Anxiety Depression    negative neurological ROS  negative psych ROS   GI/Hepatic Neg liver ROS, PUD,GERD  Medicated,,  Endo/Other  negative endocrine ROSneg diabetes    Renal/GU negative Renal ROS     Musculoskeletal   Abdominal   Peds  Hematology negative hematology ROS (+)   Anesthesia Other Findings Past Medical History: No date: Abnormal PSA     Comment:  s/p post prostate biopsy in the past which was negative 03/14/2007: Actinic keratosis     Comment:  L ant lat neck at base of neck - bx proven  01/08/2014: Actinic keratosis     Comment:  R calf - bx proven  No date: Alcoholism (HCC)     Comment:  with prolonged hospitalization 2009 for withdrawal c/b               aspiration pneumonia requiring vent No date: Anxiety No date: Atrial fibrillation and flutter (HCC) No date: B12 deficiency 03/14/2007: Basal cell carcinoma     Comment:  R distal lat tricep near elbow  03/06/2014: Basal cell carcinoma     Comment:  R calf - excision 04/22/2014 08/30/2021: Basal cell carcinoma     Comment:  suprasternal area, treated with EDC No date: Cardiomyopathy  (HCC)     Comment:  mild probably multifactorial secondary to HTn and               possible alcohol contribution. Left ventricular ejection               fraction app 45 % No date: CHF (congestive heart failure) (HCC)     Comment:  mild left ventricular systolic dysfunction No date: Dental bridge present     Comment:  "[name]" bridge, top - right No date: Depression No date: Duodenitis 01/08/2014: Dysplastic nevus     Comment:  L prox ant thigh - mild  No date: Dyspnea     Comment:  on exertion No date: Dyspnea on exertion No date: Dysrhythmia No date: Erosive esophagitis No date: Esophageal varices (HCC) No date: Fibrosarcoma (HCC) No date: Gross hematuria No date: High cholesterol 01/20/2015: Hx of melanoma in situ     Comment:  L upper back paraspinal - excision  2014: Hx of squamous cell carcinoma of skin     Comment:  multiple sites No date: Hypertension No date: Impotence No date: 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HCC) No date: Sleep apnea No date: Spinal stenosis 07/05/2012: Squamous cell carcinoma of skin     Comment:  L forearm - excision  12/16/2015: Squamous cell carcinoma of skin     Comment:  R dorsum hand  10/02/2017: Squamous cell carcinoma of skin     Comment:  R dorsum hand  No  date: Varicose veins of both lower extremities  Past Surgical History: 03/12/2019: CATARACT EXTRACTION W/PHACO; Left     Comment:  Procedure: CATARACT EXTRACTION PHACO AND INTRAOCULAR               LENS PLACEMENT (IOC) LEFT 5.27 00:38.6;  Surgeon:               Galen Manila, MD;  Location: [facility];                Service: Ophthalmology;  Laterality: Left;  sleep apnea 04/09/2019: CATARACT EXTRACTION W/PHACO; Right     Comment:  Procedure: CATARACT EXTRACTION PHACO AND INTRAOCULAR               LENS PLACEMENT (IOC) RIGHT 4.84 00:52.4;  Surgeon:               Galen Manila, MD;  Location: [facility];                Service: Ophthalmology;  Laterality:  Right; 12/2016: colonoscopy with polypectomy 12/27/2016: COLONOSCOPY WITH PROPOFOL; N/A     Comment:  Procedure: COLONOSCOPY WITH PROPOFOL;  Surgeon: Toledo,               Boykin Nearing, MD;  Location: [facility] END[facility];  Service:               Endoscopy;  Laterality: N/A; 03/23/2020: COLONOSCOPY WITH PROPOFOL; N/A     Comment:  Procedure: COLONOSCOPY WITH PROPOFOL;  Surgeon: Toledo,               Boykin Nearing, MD;  Location: [facility] END[facility];  Service:               Gastroenterology;  Laterality: N/A; 09/21/2017: ESOPHAGOGASTRODUODENOSCOPY (EGD) WITH PROPOFOL; N/A     Comment:  Procedure: ESOPHAGOGASTRODUODENOSCOPY (EGD) WITH               PROPOFOL;  Surgeon: Christena Deem, MD;  Location:               [facility];  Service: Endoscopy;  Laterality: N/A; 03/23/2020: ESOPHAGOGASTRODUODENOSCOPY (EGD) WITH PROPOFOL; N/A     Comment:  Procedure: ESOPHAGOGASTRODUODENOSCOPY (EGD) WITH               PROPOFOL;  Surgeon: Toledo, Boykin Nearing, MD;  Location:               [facility];  Service: Gastroenterology;  Laterality:               N/A; No date: melonoma removal No date: prostat biopsy x2 No date: right deltoid resection in 1977 No date: s/p resection of his right deltoid muscle in 1977 No date: TONSILLECTOMY No date: TONSILLECTOMY AND ADENOIDECTOMY  BMI    Body Mass Index: 22.24 kg/m      Reproductive/Obstetrics negative OB ROS                             Anesthesia Physical Anesthesia Plan  ASA: 3  Anesthesia Plan: General   Post-op Pain Management:    Induction: Intravenous  PONV Risk Score and Plan: 2 and Ondansetron, Dexamethasone and Treatment may vary due to age or medical condition  Airway Management Planned: Oral ETT  Additional Equipment:   Intra-op Plan:   Post-operative Plan: Extubation in OR  Informed Consent: I have reviewed the patients History and Physical, chart, labs and discussed the procedure including the risks, benefits and  alternatives for the proposed anesthesia with the patient or authorized representative who has indicated his/her understanding and acceptance.     Dental Advisory Given  Plan Discussed with: Anesthesiologist, CRNA and Surgeon  Anesthesia Plan Comments: (Patient consented for risks of anesthesia including but not limited to:  - adverse reactions to medications - damage to eyes, teeth, lips or other oral mucosa - nerve damage due to positioning  - sore throat or hoarseness - Damage to heart, brain, nerves, lungs, other parts of body or loss of life  Patient voiced understanding.)        Anesthesia Quick Evaluation

## 2022-08-05 NOTE — Transfer of Care (Signed)
Immediate Anesthesia Transfer of Care Note  Patient: Wesley Young  Procedure(s) Performed: ROBOTIC ASSISTED NAVIGATIONAL BRONCHOSCOPY VIDEO BRONCHOSCOPY WITH ENDOBRONCHIAL ULTRASOUND  Patient Location: PACU  Anesthesia Type:General  Level of Consciousness: drowsy and patient cooperative  Airway & Oxygen Therapy: Patient Spontanous Breathing and Patient connected to face mask oxygen  Post-op Assessment: Report given to RN and Post -op Vital signs reviewed and stable  Post vital signs: Reviewed and stable  Last Vitals:  Vitals Value Taken Time  BP 134/78 08/05/22 1025  Temp 36.1 C 08/05/22 1025  Pulse 59 08/05/22 1031  Resp 22 08/05/22 1032  SpO2 96 % 08/05/22 1031  Vitals shown include unvalidated device data.  Last Pain:  Vitals:   08/05/22 1025  TempSrc:   PainSc: Asleep      Patients Stated Pain Goal: 0 (08/05/22 0713)  Complications: No notable events documented.

## 2022-08-05 NOTE — Anesthesia Postprocedure Evaluation (Signed)
Anesthesia Post Note  Patient: [name]  Procedure(s) Performed: ROBOTIC ASSISTED NAVIGATIONAL BRONCHOSCOPY VIDEO BRONCHOSCOPY WITH ENDOBRONCHIAL ULTRASOUND  Patient location during evaluation: PACU Anesthesia Type: General Level of consciousness: awake and alert Pain management: pain level controlled Vital Signs Assessment: post-procedure vital signs reviewed and stable Respiratory status: spontaneous breathing, nonlabored ventilation, respiratory function stable and patient connected to nasal cannula oxygen Cardiovascular status: blood pressure returned to baseline and stable Postop Assessment: no apparent nausea or vomiting Anesthetic complications: no  No notable events documented.   Last Vitals:  Vitals:   08/05/22 1032 08/05/22 1045  BP: (!) 112/55 (!) 102/59  Pulse: (!) 59 66  Resp: (!) 21 18  Temp:  (!) 36 C  SpO2: 96% 96%    Last Pain:  Vitals:   08/05/22 1045  TempSrc:   PainSc: 0-No pain                 Stephanie Coup

## 2022-08-05 NOTE — Procedures (Signed)
ELECTROMAGNETIC NAVIGATIONAL BRONCHOSCOPY PROCEDURE NOTE  FIBEROPTIC BRONCHOSCOPY WITH BRONCHOALVEOLAR LAVAGE AND THERAPEUTIC ASPIRATION OF TRACHEOBRONCHIAL TRE PROCEDURE NOTE  ENDOBRONCHIAL ULTRASOUND PROCEDURE NOTE    Flexible bronchoscopy was performed  by : Karna Christmas MD  assistance by : 1)Repiratory therapist  and 2)[employer] staff and 3) Anesthesia team and 4) Flouroscopy team and 5) [facility] supporting staff   Indication for the procedure was :  Pre-procedural H&P. The following assessment was performed on the day of the procedure prior to initiating sedation History:  Chest pain n Dyspnea y Hemoptysis n Cough y Fever n Other pertinent items n  Examination Vital signs -reviewed as per nursing documentation today Cardiac    Murmurs: n  Rubs : n  Gallop: n Lungs Wheezing: n Rales : n Rhonchi :y  Other pertinent findings: SOB/hypoxemia due to chronic lung disease   Pre-procedural assessment for Procedural Sedation included: Depth of sedation: As per anesthesia team  ASA Classification:  2 Mallampati airway assessment: 3    Medication list reviewed: y  The patient's interval history was taken and revealed: no new complaints The pre- procedure physical examination revealed: No new findings Refer to prior clinic note for details.  Informed Consent: Informed consent was obtained from:  patient after explanation of procedure and risks, benefits, as well as alternative procedures available.  Explanation of level of sedation and possible transfusion was also provided.    Procedural Preparation: Time out was performed and patient was identified by name and birthdate and procedure to be performed and side for sampling, if any, was specified. Pt was intubated by anesthesia.  The patient was appropriately draped.   Fiberoptic bronchoscopy with airway inspection and BAL Procedure findings:  Bronchoscope was inserted via ETT  without difficulty.  Posterior  oropharynx, epiglottis, arytenoids, false cords and vocal cords were not visualized as these were bypassed by endotracheal tube. The distal trachea was normal in circumference and appearance without mucosal, cartilaginous or branching abnormalities.  The main carina was mildly splayed . All right and left lobar airways were visualized to the Subsegmental level.  Sub- sub segmental carinae were identified in all the distal airways.   Secretions were visible in the following airways and appeared to be clear.  The mucosa was : friable at lUL AND LLL  Airways were notable for:        exophytic lesions :n       extrinsic compression in the following distributions: n.       Friable mucosa: y       Teacher, music /pigmentation: n   BAL X 1 - CYTOLOGY  MUCUS PLUGGING REMOVED VIA ASPIRATION X 3   Post procedure Diagnosis:   LUNG CANCER     Electromagnetic Navigational Bronchoscopy Procedure Findings:    Post appropriate planning and registration peripheral navigation was used to visualize target lesion.    Post procedure diagnosis: LUNG CANCER  -CYTOBRUSH X 2 - CYTOLOGY -ENDOBRONCHIAL LEFT LOWER LOBE BIOPSY X 4 - LESIONAL CARCINOMA -TRANSBRONCHIAL BIOPSY LEFT LOWER LOBE X 4 -LESIONAL CARCINOMA  TUMOR WAS BETWEEN UPPER AND LOWER LOBE    Endobronchial ultrasound assisted hilar and mediastinal lymph node biopsies procedure findings: The fiberoptic bronchoscope was removed and the EBUS scope was introduced. Examination began to evaluate for pathologically enlarged lymph nodes starting on the RIGHT side progressing to the LEFT side.  All lymph node biopsies performed with 22G needle. Lymph node biopsies were sent in cytolite for all stations.  STATION 10R-1.1CM - 3 BIOPSIES STATION 7 -  1.4CM - 3 BIOPSIES  STATION 10L - 1.3CM - 4BIOPSIES - LESIONAL   Post procedure diagnosis:  METASTATIC LUNG CANCER    Specimens obtained included:   With secondary sampling in NONE  additional  lobes  Immediate sampling complications included:NONE  Epinephrine NONEml was used topically  The bronchoscopy was terminated due to completion of the planned procedure and the bronchoscope was removed.   Total dosage of Lidocaine was NONE mg Total fluoroscopy time was AS PER RADS minutes  Supplemental oxygen was provided at AS PER ANESTHESHIA lpm by nasal canula post operatively  Estimated Blood loss: EXCPECTED < 10 cc.  Complications included:  NONE IMMEDIATE   Preliminary CXR findings :  IN PROCESS   Disposition: STILL INPATIENT  Follow up with Dr. Karna Christmas in 5 days for result discussion.     Vida Rigger MD  [facility] Division of Pulmonary & Critical Care Medicine

## 2022-08-05 NOTE — Progress Notes (Signed)
PULMONOLOGY         Date: 08/05/2022,   MRN# [id number] Wesley Young Nov 08, 1940     AdmissionWeight: 74.4 kg                 CurrentWeight: 74.4 kg  Referring provider: Dr Sedalia Muta    CHIEF COMPLAINT:   Non-massive hemoptysis with lung mass   HISTORY OF PRESENT ILLNESS   This is a pleasant 82yo male with hx of hypertension, anxiety, depression, history of alcohol use, history of fibrosarcoma in 1977, and history of melanoma s/p removal (2022), who presents to the emergency department for chief concerns of non massive hemoptysis. Vitals in the ED showed temperature of 98.1, respiration rate of 18, heart rate of 62, blood pressure 140/95, SpO2 of 99% on room air.  Serum sodium is 135, potassium 4.6, chloride 103, bicarb 25, BUN of 17, serum creatinine of 0.65, EGFR greater than 60, nonfasting blood glucose 125, WBC 9.7, hemoglobin 12.5, platelets of 275.   CT of the chest with contrast: Was read as 7.1 x 6.0 x 4.4 cm partially necrotic central left lung mass involving the inferior aspect of the left upper lobe and superior aspect of the left lower lobe.  Findings consistent with primary neoplasm.  Aortic atherosclerosis.  No worrisome pulmonary nodules to suggest pulmonary metastatic disease. PCCM consultation for further evaluation and management of non massive hemoptysis and left lung mass.   08/03/22- patient stable , walked around with PT/OT.  Plan for bronchoscopy in AM 08/05/22.  NPO at midnight on Thursday.  Off  lovenox until dc home. SCD only.   08/04/22- Patient with no overnight events. [facility] pilot CT study is done. Plan for bronch in am.    08/05/22- patient s/p bronchoscopy.  Findings consistent with NSCLC malignancy with metastasis to hilar lymph nodes.  Patient being optimized for dc home   PAST MEDICAL HISTORY   Past Medical History:  Diagnosis Date   Abnormal PSA    s/p post prostate biopsy in the past which was negative   Actinic keratosis 03/14/2007   L  ant lat neck at base of neck - bx proven    Actinic keratosis 01/08/2014   R calf - bx proven    Alcoholism (HCC)    with prolonged hospitalization 2009 for withdrawal c/b aspiration pneumonia requiring vent   Anxiety    Atrial fibrillation and flutter (HCC)    B12 deficiency    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 Left ventricular ejection fraction app 45 %   CHF (congestive heart failure) (HCC)    mild left ventricular systolic dysfunction   Dental bridge present    "[place]" bridge, top - right   Depression    Duodenitis    Dysplastic nevus 01/08/2014   L prox ant thigh - mild    Dyspnea    on exertion   Dyspnea on exertion    Dysrhythmia    Erosive esophagitis    Esophageal varices (HCC)    Fibrosarcoma (HCC)    Gross hematuria    High cholesterol    Hx of melanoma in situ 01/20/2015   L upper back paraspinal - excision    Hx  of squamous cell carcinoma of skin 2014   multiple sites   Hypertension    Impotence    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HCC)    Sleep apnea    Spinal stenosis    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   L forearm - excision    Squamous cell carcinoma of skin 12/16/2015   R dorsum hand    Squamous cell carcinoma of skin 10/02/2017   R dorsum hand    Varicose veins of both lower extremities      SURGICAL HISTORY   Past Surgical History:  Procedure Laterality Date   CATARACT EXTRACTION W/PHACO Left 03/12/2019   Procedure: CATARACT EXTRACTION PHACO AND INTRAOCULAR LENS PLACEMENT (IOC) LEFT 5.27 00:38.6;  Surgeon: Galen Manila, MD;  Location: [facility];  Service: Ophthalmology;  Laterality: Left;  sleep apnea   CATARACT EXTRACTION W/PHACO Right  04/09/2019   Procedure: CATARACT EXTRACTION PHACO AND INTRAOCULAR LENS PLACEMENT (IOC) RIGHT 4.84 00:52.4;  Surgeon: Galen Manila, MD;  Location: [facility];  Service: Ophthalmology;  Laterality: Right;   colonoscopy with polypectomy  12/2016   COLONOSCOPY WITH PROPOFOL N/A 12/27/2016   Procedure: COLONOSCOPY WITH PROPOFOL;  Surgeon: Toledo, Boykin Nearing, MD;  Location: [facility];  Service: Endoscopy;  Laterality: N/A;   COLONOSCOPY WITH PROPOFOL N/A 03/23/2020   Procedure: COLONOSCOPY WITH PROPOFOL;  Surgeon: Toledo, Boykin Nearing, MD;  Location: [facility];  Service: Gastroenterology;  Laterality: N/A;   ESOPHAGOGASTRODUODENOSCOPY (EGD) WITH PROPOFOL N/A 09/21/2017   Procedure: ESOPHAGOGASTRODUODENOSCOPY (EGD) WITH PROPOFOL;  Surgeon: Christena Deem, MD;  Location: [facility]COPY;  Service: Endoscopy;  Laterality: N/A;   ESOPHAGOGASTRODUODENOSCOPY (EGD) WITH PROPOFOL N/A 03/23/2020   Procedure: ESOPHAGOGASTRODUODENOSCOPY (EGD) WITH PROPOFOL;  Surgeon: Toledo, Boykin Nearing, MD;  Location: [facility];  Service: Gastroenterology;  Laterality: N/A;   melonoma removal     prostat biopsy x2     right deltoid resection in 1977     s/p resection of his right deltoid muscle in 1977     TONSILLECTOMY     TONSILLECTOMY AND ADENOIDECTOMY       FAMILY HISTORY   Family History  Problem Relation Age of Onset   Ovarian cancer Mother    Heart attack Father    Obesity Paternal Uncle      SOCIAL HISTORY   Social History   Tobacco Use   Smoking status: Former    Types: Cigarettes    Quit date: 1977    Years since quitting: 47.4   Smokeless tobacco: Never  Vaping Use   Vaping Use: Never used  Substance Use Topics   Alcohol use: Not Currently    Comment: quit 03/26/2008   Drug use: Not Currently     MEDICATIONS    Home Medication:     Current Medication:  Current Facility-Administered Medications:    acetaminophen (TYLENOL) tablet 650 mg, 650 mg, Oral, Q6H PRN  **OR** acetaminophen (TYLENOL) suppository 650 mg, 650 mg, Rectal, Q6H PRN, Cox, Amy N, DO   donepezil (ARICEPT) tablet 5 mg, 5 mg, Oral, q AM, Cox, Amy N, DO, 5 mg at 08/04/22 0815   fentaNYL (SUBLIMAZE) injection 25-50 mcg, 25-50 mcg, Intravenous, Q5 min PRN, Stephanie Coup, MD   finasteride (PROSCAR) tablet 5 mg, 5 mg, Oral, Daily, Cox, Amy N, DO, 5 mg at 08/04/22 0816   hydrALAZINE (APRESOLINE) injection 5 mg, 5 mg, Intravenous, Q6H PRN, Cox, Amy N, DO   losartan (COZAAR) tablet 50 mg, 50 mg, Oral, Daily, Cox, Amy N, DO, 50  mg at 08/03/22 0914   melatonin tablet 5 mg, 5 mg, Oral, QHS PRN, Cox, Amy N, DO   ondansetron (ZOFRAN) tablet 4 mg, 4 mg, Oral, Q6H PRN **OR** ondansetron (ZOFRAN) injection 4 mg, 4 mg, Intravenous, Q6H PRN, Cox, Amy N, DO   oxyCODONE (Oxy IR/ROXICODONE) immediate release tablet 5 mg, 5 mg, Oral, Once PRN **OR** oxyCODONE (ROXICODONE) 5 MG/5ML solution 5 mg, 5 mg, Oral, Once PRN, Stephanie Coup, MD   pramipexole (MIRAPEX) tablet 0.5 mg, 0.5 mg, Oral, QHS, Cox, Amy N, DO, 0.5 mg at 08/04/22 2020   senna-docusate (Senokot-S) tablet 1 tablet, 1 tablet, Oral, QHS PRN, Cox, Amy N, DO   tamsulosin (FLOMAX) capsule 0.4 mg, 0.4 mg, Oral, Daily, Cox, Amy N, DO, 0.4 mg at 08/04/22 0816   traMADol (ULTRAM) tablet 50 mg, 50 mg, Oral, Q6H PRN, Cox, Amy N, DO, 50 mg at 08/05/22 1610  Facility-Administered Medications Ordered in Other Encounters:    dexamethasone (DECADRON) injection, , Intravenous, Anesthesia Intra-op, Lynden Oxford, CRNA, 5 mg at 08/05/22 0859   ePHEDrine injection, , Intravenous, Anesthesia Intra-op, Lynden Oxford, CRNA, 12.5 mg at 08/05/22 0851   fentaNYL (SUBLIMAZE) injection, , Intravenous, Anesthesia Intra-op, Lynden Oxford, CRNA, 25 mcg at 08/05/22 1010   glycopyrrolate (ROBINUL) injection, , Intravenous, Anesthesia Intra-op, Lynden Oxford, CRNA, 0.2 mg at 08/05/22 0915   lactated ringers infusion, , Intravenous, Continuous PRN, Lynden Oxford, CRNA, New Bag at 08/05/22 [phone]   lidocaine (cardiac) 100 mg/60mL (XYLOCAINE) injection 2%, , Intravenous, Anesthesia Intra-op, Lynden Oxford, CRNA, 80 mg at 08/05/22 0844   ondansetron (ZOFRAN) injection, , Intravenous, Anesthesia Intra-op, Lynden Oxford, CRNA, 4 mg at 08/05/22 0859   phenylephrine (NEO-SYNEPHRINE) 20mg /NS premix infusion, , Intravenous, Continuous PRN, Lynden Oxford, CRNA, Stopped at 08/05/22 1007   phenylephrine (NEO-SYNEPHRINE) injection, , Intravenous, Anesthesia Intra-op, Lynden Oxford, CRNA, 160 mcg at 08/05/22 0851   propofol (DIPRIVAN) 10 mg/mL bolus/IV push, , Intravenous, Anesthesia Intra-op, Lynden Oxford, CRNA, 150 mg at 08/05/22 0845   rocuronium (ZEMURON) injection, , Intravenous, Anesthesia Intra-op, Lynden Oxford, CRNA, 10 mg at 08/05/22 0941   sugammadex sodium (BRIDION) injection, , Intravenous, Anesthesia Intra-op, Lynden Oxford, CRNA, 200 mg at 08/05/22 1007   vasopressin (PITRESSIN) 20 UNIT/ML injection, , Intravenous, Anesthesia Intra-op, Lynden Oxford, CRNA, 1 Units at 08/05/22 1001    ALLERGIES   Ativan [lorazepam], Benadryl [diphenhydramine], Crestor [rosuvastatin calcium], Nsaids, Rapaflo [silodosin], Statins, Zetia [ezetimibe], Latex, and Neosporin [neomycin-bacitracin zn-polymyx]     REVIEW OF SYSTEMS    Review of Systems:  Gen:  Denies  fever, sweats, chills weigh loss  HEENT: Denies blurred vision, double vision, ear pain, eye pain, hearing loss, nose bleeds, sore throat Cardiac:  No dizziness, chest pain or heaviness, chest tightness,edema Resp:   reports dyspnea chronically  Gi: Denies swallowing difficulty, stomach pain, nausea or vomiting, diarrhea, constipation, bowel incontinence Gu:  Denies bladder incontinence, burning urine Ext:   Denies Joint pain, stiffness or swelling Skin: Denies  skin rash, easy bruising or bleeding or hives Endoc:  Denies polyuria, polydipsia , polyphagia or weight  change Psych:   Denies depression, insomnia or hallucinations   Other:  All other systems negative   VS: BP (!) 112/56   Pulse 68   Temp (!) 97.3 F (36.3 C) (Temporal)   Resp 18   Ht 6' (1.829 m)   Wt 74.4 kg   SpO2 98%   BMI 22.24 kg/m  PHYSICAL EXAM    GENERAL:NAD, no fevers, chills, no weakness no fatigue HEAD: Normocephalic, atraumatic.  EYES: Pupils equal, round, reactive to light. Extraocular muscles intact. No scleral icterus.  MOUTH: Moist mucosal membrane. Dentition intact. No abscess noted.  EAR, NOSE, THROAT: Clear without exudates. No external lesions.  NECK: Supple. No thyromegaly. No nodules. No JVD.  PULMONARY: decreased breath sounds with mild rhonchi worse at bases bilaterally.  CARDIOVASCULAR: S1 and S2. Regular rate and rhythm. No murmurs, rubs, or gallops. No edema. Pedal pulses 2+ bilaterally.  GASTROINTESTINAL: Soft, nontender, nondistended. No masses. Positive bowel sounds. No hepatosplenomegaly.  MUSCULOSKELETAL: No swelling, clubbing, or edema. Range of motion full in all extremities.  NEUROLOGIC: Cranial nerves II through XII are intact. No gross focal neurological deficits. Sensation intact. Reflexes intact.  SKIN: No ulceration, lesions, rashes, or cyanosis. Skin warm and dry. Turgor intact.  PSYCHIATRIC: Mood, affect within normal limits. The patient is awake, alert and oriented x 3. Insight, judgment intact.       IMAGING   CLINICAL DATA:  Followup abnormal chest x-ray.   EXAM: CT CHEST WITH CONTRAST   TECHNIQUE: Multidetector CT imaging of the chest was performed during intravenous contrast administration.   RADIATION DOSE REDUCTION: This exam was performed according to the departmental dose-optimization program which includes automated exposure control, adjustment of the mA and/or kV according to patient size and/or use of iterative reconstruction technique.   CONTRAST:  75mL OMNIPAQUE IOHEXOL 300 MG/ML  SOLN   COMPARISON:   Chest x-ray, same date.   FINDINGS: Cardiovascular: The heart is normal in size. No pericardial effusion. Age related atherosclerotic calcifications involving the aorta but no aneurysm or dissection. The branch vessels are patent. Three-vessel coronary artery calcifications are noted.   Mediastinum/Nodes: Extensive left hilar adenopathy. There is also a borderline enlarged prevascular node measuring 9 mm on image 82/2. Left-sided subcarinal node measures 11 mm on image 88/2. No contralateral mediastinal adenopathy. The esophagus is grossly.   Lungs/Pleura: Large partially necrotic central left lung mass involving both the inferior aspect of the left upper lobe and the superior aspect of the left lower lobe. It measures approximately 7.1 x 6.0 x 4.4 cm. It surrounds the descending thoracic aorta. Findings consistent with a primary neoplasm. Recommend bronchoscopic biopsy. Interstitial changes surrounding lesion peripherally in the left lower lobe could be obstructive pneumonitis and atelectasis. No worrisome pulmonary nodules to suggest pulmonary metastatic disease. No pleural effusions or pleural nodules.   Upper Abdomen: No significant upper abdominal findings. No hepatic or adrenal gland lesions. No upper abdominal adenopathy. Simple 6 cm nonenhancing left renal cyst not requiring any further imaging evaluation or follow-up. Moderate age related atherosclerotic calcifications involving the abdominal aorta and branch vessels.   Musculoskeletal: No chest wall mass, supraclavicular or axillary adenopathy. The thyroid gland is unremarkable. No worrisome bone lesions to suggest metastatic disease. Advanced degenerative changes involving the right shoulder.   IMPRESSION: 1. 7.1 x 6.0 x 4.4 cm partially necrotic central left lung mass involving both the inferior aspect of the left upper lobe and the superior aspect of the left lower lobe. Findings consistent with a primary neoplasm.  Recommend bronchoscopic biopsy. 2. Extensive left hilar adenopathy and borderline enlarged prevascular and subcarinal lymph nodes. 3. No worrisome pulmonary nodules to suggest pulmonary metastatic disease. 4. No findings for upper abdominal metastatic disease or osseous metastatic disease. 5. Age related atherosclerotic calcifications involving the thoracic and abdominal aorta and branch vessels including the coronary arteries. 6. Aortic atherosclerosis.  Aortic Atherosclerosis (ICD10-I70.0).     Electronically Signed   By: Rudie Meyer M.D.   On: 08/02/2022 13:12  ASSESSMENT/PLAN   Non massive hemoptysis and left lung mass     - likely related to underlying lung cancer - oncology is on case appreciate input.   - tissue sampling with bronchoscopy   -patient on room air with no chest pain   - he smoked in the past for 20 years and stopped in 1977.  He had fibrosarcoma in the past in 1977 and (removed right deltoid)  had radiation in 1977.   -he on on lovenox and we can hold that until dc home.   -s/p bronchoscopy suggestive of lung cancer   Thank you for allowing me to participate in the care of this patient.   Patient/Family are satisfied with care plan and all questions have been answered.    Provider disclosure: Patient with at least one acute or chronic illness or injury that poses a threat to life or bodily function and is being managed actively during this encounter.  All of the below services have been performed independently by signing provider:  review of prior documentation from internal and or external health records.  Review of previous and current lab results.  Interview and comprehensive assessment during patient visit today. Review of current and previous chest radiographs/CT scans. Discussion of management and test interpretation with health care team and patient/family.   This document was prepared using Dragon voice recognition software and may include  unintentional dictation errors.     Vida Rigger, M.D.  Division of Pulmonary & Critical Care Medicine

## 2022-08-05 NOTE — Progress Notes (Signed)
Patient sent for bronchoscopy via bed in stable condition.

## 2022-08-05 NOTE — Discharge Summary (Signed)
Physician Discharge Summary   Patient: Wesley Young MRN: [id number] DOB: May 11, 1940  Admit date:     08/02/2022  Discharge date: 08/05/22  Discharge Physician: Arnetha Courser   PCP: Lauro Regulus, MD   Recommendations at discharge:  Please obtain CBC and BMP during next follow-up appointment Follow-up with pulmonology Follow-up with oncology  Discharge Diagnoses: Principal Problem:   Hemoptysis Active Problems:   Pulmonary neoplasm   Mild anemia   Essential hypertension   Chronic pain   OSA on CPAP   Depression with anxiety   Hospital Course: [name] is an 82 year old male with history of hypertension, anxiety, depression, history of alcohol use, history of fibrosarcoma in 1977, and history of melanoma s/p removal (2022), who presents to the emergency department for chief concerns of coughing up blood  Vitals in the ED showed temperature of 98.1, respiration rate of 18, heart rate of 62, blood pressure 140/95, SpO2 of 99% on room air.  Serum sodium is 135, potassium 4.6, chloride 103, bicarb 25, BUN of 17, serum creatinine of 0.65, EGFR greater than 60, nonfasting blood glucose 125, WBC 9.7, hemoglobin 12.5, platelets of 275.  CT of the chest with contrast: Was read as 7.1 x 6.0 x 4.4 cm partially necrotic central left lung mass involving the inferior aspect of the left upper lobe and superior aspect of the left lower lobe.  Findings consistent with primary neoplasm.  Aortic atherosclerosis.  No worrisome pulmonary nodules to suggest pulmonary metastatic disease.  6/12: Pulmonary and oncology was consulted and patient will have his bronchoscopy on Friday.  Hemoglobin at 12.1.  CT abdomen and pelvis was negative for any evidence of metastatic disease.  6/13: Hemodynamically stable, blood pressure on softer side.  Hemoglobin decreased to 11.6.  6/14: Patient remained hemodynamically stable.  Underwent bronchoscopy, tumor was found between left middle and lower  lobe, multiple biopsies were taken.  Concern of metastasis to lymph node.  No hemoptysis after the procedure.  Patient is being discharged with some Tessalon Perles to use as needed for cough.  He will need a close follow-up with his providers which should include pulmonary and oncology to discuss the biopsy results and further management.  Plan was discussed with patient and wife at bedside.  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 Cathie Hoops who recommends hospitalist admission for further workup CT of the abdomen and pelvis was negative for any metastatic disease Pulmonology service, Dr. Karna Christmas has been consulted  -Going for bronchoscopy on Friday morning -Further treatment plan based on pathology.  Mild anemia Suspect secondary to hemoptysis in setting of new pulmonary neoplasm diagnosis, continue monitoring Repeat CBC in a.m.  Essential hypertension Resumed home losartan 50 mg daily Hydralazine 5 mg IV every 6 hours as needed for SBP greater 175, 4 days ordered  Chronic pain Resumed home tramadol 50 mg every 6 hours as needed for moderate pain -Continue home pramipexole for restless leg  OSA on CPAP CPAP nightly ordered   Consultants: Pulmonology.  Oncology Procedures performed: Bronchoscopy and biopsy Disposition: Home Diet recommendation:  Discharge Diet Orders (From admission, onward)     Start     Ordered   08/05/22 0000  Diet - low sodium heart healthy        08/05/22 1308  Cardiac diet DISCHARGE MEDICATION: Allergies as of 08/05/2022       Reactions   Ativan [lorazepam]    Benadryl [diphenhydramine]    Withdraw symptoms per patient   Crestor [rosuvastatin Calcium]    Nsaids    Rapaflo  [silodosin]    Statins Other (See Comments)   Zetia [ezetimibe]    Latex Rash   Bandaids only   Neosporin [neomycin-bacitracin Zn-polymyx] Rash        Medication List     TAKE these medications    acetaminophen 325 MG tablet Commonly known as: TYLENOL Take 650 mg by mouth every 6 (six) hours as needed.   ALIGN PO Take daily by mouth.   benzonatate 200 MG capsule Commonly known as: TESSALON Take 1 capsule (200 mg total) by mouth 3 (three) times daily as needed for cough.   CITRACAL + D PO Take by mouth 2 (two) times daily.   cyanocobalamin 1000 MCG/ML injection Commonly known as: VITAMIN B12 Inject 1,000 mcg into the muscle every 30 (thirty) days.   donepezil 5 MG tablet Commonly known as: ARICEPT Take 5 mg by mouth in the morning.   ferrous sulfate 324 MG Tbec Take 325 mg by mouth daily with breakfast.   finasteride 5 MG tablet Commonly known as: PROSCAR Take 5 mg daily by mouth.   KRILL OIL PO Take by mouth daily.   losartan 50 MG tablet Commonly known as: COZAAR Take 50 mg by mouth daily.   polyethylene glycol 17 g packet Commonly known as: MIRALAX / GLYCOLAX Take 17 g by mouth daily as needed.   pramipexole 0.5 MG tablet Commonly known as: MIRAPEX Take 0.5 mg by mouth at bedtime.   PREVAGEN PO Take by mouth daily.   tamsulosin 0.4 MG Caps capsule Commonly known as: FLOMAX Take 0.4 mg by mouth daily.   TART CHERRY ADVANCED PO Take by mouth daily.   traMADol 50 MG tablet Commonly known as: ULTRAM Take 50 mg every 6 (six) hours as needed by mouth.   VITAMIN B1 PO Take 1 tablet by mouth daily.   VITAMIN C PO Take 500 mg by mouth daily.        Follow-up Information     Lauro Regulus, MD. Schedule an appointment as soon as possible for a visit .   Specialty: Internal Medicine Contact information: [address] [facility] [place] [address] [phone]         Vida Rigger, MD. Schedule an  appointment as soon as possible for a visit in 1 week(s).   Specialty: Pulmonary Disease Contact information: [address] [place] [address] [phone]         Rickard Patience, MD. Schedule an appointment as soon as possible for a visit in 1 week(s).   Specialty: Oncology Contact information: [address] [place] [address] [phone]                Discharge Exam: Ceasar Mons Weights   08/02/22 0900 08/05/22 0713  Weight: 74.4 kg 74.4 kg   General.  Frail elderly man, in no acute distress. Pulmonary.  Harsh breath sounds bilaterally, normal respiratory effort. CV.  Regular rate and rhythm, no JVD, rub or murmur. Abdomen.  Soft, nontender, nondistended, BS positive. CNS.  Alert and oriented .  No focal neurologic deficit. Extremities.  No edema, no cyanosis, pulses intact and symmetrical. Psychiatry.  Judgment and insight appears normal.   Condition at discharge: stable  The results  of significant diagnostics from this hospitalization (including imaging, microbiology, ancillary and laboratory) are listed below for reference.   Imaging Studies: DG Chest Port 1 View  Result Date: 08/05/2022 CLINICAL DATA:  Status post bronchoscopy. EXAM: PORTABLE CHEST 1 VIEW COMPARISON:  X-ray 08/02/2022.  CT 08/04/2022 FINDINGS: Focal mass lesion again seen suprahilar on the left. Apical pleural thickening. No pneumothorax, edema or effusion. Minimal opacity in the left lung base. Normal cardiopericardial silhouette. Degenerative changes of the spine. Overlapping cardiac leads. IMPRESSION: No pneumothorax identified post bronchoscopy. Electronically Signed   By: Karen Kays M.D.   On: 08/05/2022 10:42   CT SUPER D CHEST WO MONARCH PILOT  Result Date: 08/05/2022 CLINICAL DATA:  82 year old male with history of non massive hemoptysis with lung mass. EXAM: CT CHEST WITHOUT CONTRAST TECHNIQUE: Multidetector CT imaging of the chest was performed using thin slice collimation  for electromagnetic bronchoscopy planning purposes, without intravenous contrast. RADIATION DOSE REDUCTION: This exam was performed according to the departmental dose-optimization program which includes automated exposure control, adjustment of the mA and/or kV according to patient size and/or use of iterative reconstruction technique. COMPARISON:  Chest CT 08/02/2022. FINDINGS: Cardiovascular: Heart size is normal. There is no significant pericardial fluid, thickening or pericardial calcification. There is aortic atherosclerosis, as well as atherosclerosis of the great vessels of the mediastinum and the coronary arteries, including calcified atherosclerotic plaque in the left main, left anterior descending, left circumflex and right coronary arteries. Calcifications of the aortic valve. Mediastinum/Nodes: Prominent soft tissue in the left hilar region difficult to discriminate from adjacent vasculature on today's noncontrast CT examination, the corresponding to lymphadenopathy on recent contrast-enhanced study. No other mediastinal or right hilar lymphadenopathy noted. Esophagus is unremarkable in appearance. No axillary lymphadenopathy. Lungs/Pleura: Again noted is a large irregular shaped mass in the medial aspect of the posterior left hemithorax (axial image 77 of series 2 and sagittal image 120 of series 7) estimated to measure approximately 6.0 x 5.9 x 7.8 cm on today's examination. This mass crosses the superior aspect of the left major fissure with involvement of both the superior segment of the left lower lobe and posteromedial aspect of the left upper lobe. Surrounding areas of ground-glass attenuation and septal thickening are noted in the adjacent lung parenchyma, most severe in the left lower lobe, most likely to reflect postobstructive inflammation, although some degree of lymphangitic spread of tumor is not excluded. Thickening of the superior aspect of the left major fissure is noted. Scattered areas  of mild cylindrical bronchiectasis and peripheral bronchiolectasis, along with clustered micro nodularity are noted, most evident in the medial aspect of the right upper lobe, and in the right middle lobe, most compatible with areas of chronic post infectious or inflammatory scarring. No pleural effusions. Upper Abdomen: Partially imaged exophytic low-attenuation lesion in the lateral aspect of the upper pole of the left kidney, incompletely characterized on today's noncontrast CT examination, but statistically likely a cyst (no imaging follow-up recommended) measuring at least 6.7 cm. Aortic atherosclerosis. Calcifications in the head and body of the pancreas, likely sequela of chronic pancreatitis. Musculoskeletal: There are no aggressive appearing lytic or blastic lesions noted in the visualized portions of the skeleton. IMPRESSION: 1. 6.0 x 5.9 x 7.8 cm aggressive appearing mass in the left lung involving portions of both the left upper and lower lobes, highly concerning for primary bronchogenic neoplasm. Previously noted left hilar lymphadenopathy is not well demonstrated on today's noncontrast CT examination, although left hilar fullness likely reflects malignant nodal involvement.  2. Aortic atherosclerosis, in addition to left main and three-vessel coronary artery disease. 3. There are calcifications of the aortic valve. Echocardiographic correlation for evaluation of potential valvular dysfunction may be warranted if clinically indicated. Aortic Atherosclerosis (ICD10-I70.0). Electronically Signed   By: Trudie Reed M.D.   On: 08/05/2022 08:47   CT ABDOMEN PELVIS W CONTRAST  Result Date: 08/02/2022 CLINICAL DATA:  Metastatic disease evaluation. EXAM: CT ABDOMEN AND PELVIS WITH CONTRAST TECHNIQUE: Multidetector CT imaging of the abdomen and pelvis was performed using the standard protocol following bolus administration of intravenous contrast. RADIATION DOSE REDUCTION: This exam was performed  according to the departmental dose-optimization program which includes automated exposure control, adjustment of the mA and/or kV according to patient size and/or use of iterative reconstruction technique. CONTRAST:  80mL OMNIPAQUE IOHEXOL 300 MG/ML  SOLN COMPARISON:  Chest CT performed earlier today. FINDINGS: Lower chest: Assessed on chest CT earlier today. Hepatobiliary: No focal hepatic lesion. Borderline hepatic steatosis. Unremarkable gallbladder. No biliary dilatation. Pancreas: Calcifications in the pancreatic head typical of chronic pancreatitis. No acute pancreatic inflammation. No ductal dilatation or pancreatic mass. Spleen: Normal in size without focal abnormality. Adrenals/Urinary Tract: No adrenal nodule. There is early excretion of IV contrast in the renal collecting systems which limits assessment for stone. No hydronephrosis. Simple cyst no solid renal lesion. Excreted IV contrast within the urinary bladder. In the left kidney, needing no further imaging follow-up. Stomach/Bowel: No bowel obstruction or inflammation. Moderate volume of colonic stool. Scattered colonic diverticulosis without diverticulitis. Small hiatal hernia. Vascular/Lymphatic: Aortic atherosclerosis. No aneurysm. Iliac vessels are tortuous. No enlarged lymph nodes in the abdomen or pelvis. Reproductive: Enlarged prostate spans 5.1 cm. Other: No ascites. No omental thickening. No subcutaneous lesions. Musculoskeletal: Diffuse lumbar degenerative change. No lytic or blastic osseous lesions. IMPRESSION: 1. No evidence of primary malignancy or metastatic disease in the abdomen/pelvis. 2. Colonic diverticulosis without diverticulitis. 3. Enlarged prostate. 4. Chronic pancreatitis. Aortic Atherosclerosis (ICD10-I70.0). Electronically Signed   By: Narda Rutherford M.D.   On: 08/02/2022 15:41   CT Chest W Contrast  Result Date: 08/02/2022 CLINICAL DATA:  Followup abnormal chest x-ray. EXAM: CT CHEST WITH CONTRAST TECHNIQUE:  Multidetector CT imaging of the chest was performed during intravenous contrast administration. RADIATION DOSE REDUCTION: This exam was performed according to the departmental dose-optimization program which includes automated exposure control, adjustment of the mA and/or kV according to patient size and/or use of iterative reconstruction technique. CONTRAST:  75mL OMNIPAQUE IOHEXOL 300 MG/ML  SOLN COMPARISON:  Chest x-ray, same date. FINDINGS: Cardiovascular: The heart is normal in size. No pericardial effusion. Age related atherosclerotic calcifications involving the aorta but no aneurysm or dissection. The branch vessels are patent. Three-vessel coronary artery calcifications are noted. Mediastinum/Nodes: Extensive left hilar adenopathy. There is also a borderline enlarged prevascular node measuring 9 mm on image 82/2. Left-sided subcarinal node measures 11 mm on image 88/2. No contralateral mediastinal adenopathy. The esophagus is grossly. Lungs/Pleura: Large partially necrotic central left lung mass involving both the inferior aspect of the left upper lobe and the superior aspect of the left lower lobe. It measures approximately 7.1 x 6.0 x 4.4 cm. It surrounds the descending thoracic aorta. Findings consistent with a primary neoplasm. Recommend bronchoscopic biopsy. Interstitial changes surrounding lesion peripherally in the left lower lobe could be obstructive pneumonitis and atelectasis. No worrisome pulmonary nodules to suggest pulmonary metastatic disease. No pleural effusions or pleural nodules. Upper Abdomen: No significant upper abdominal findings. No hepatic or adrenal gland lesions. No upper abdominal  adenopathy. Simple 6 cm nonenhancing left renal cyst not requiring any further imaging evaluation or follow-up. Moderate age related atherosclerotic calcifications involving the abdominal aorta and branch vessels. Musculoskeletal: No chest wall mass, supraclavicular or axillary adenopathy. The thyroid  gland is unremarkable. No worrisome bone lesions to suggest metastatic disease. Advanced degenerative changes involving the right shoulder. IMPRESSION: 1. 7.1 x 6.0 x 4.4 cm partially necrotic central left lung mass involving both the inferior aspect of the left upper lobe and the superior aspect of the left lower lobe. Findings consistent with a primary neoplasm. Recommend bronchoscopic biopsy. 2. Extensive left hilar adenopathy and borderline enlarged prevascular and subcarinal lymph nodes. 3. No worrisome pulmonary nodules to suggest pulmonary metastatic disease. 4. No findings for upper abdominal metastatic disease or osseous metastatic disease. 5. Age related atherosclerotic calcifications involving the thoracic and abdominal aorta and branch vessels including the coronary arteries. 6. Aortic atherosclerosis. Aortic Atherosclerosis (ICD10-I70.0). Electronically Signed   By: Rudie Meyer M.D.   On: 08/02/2022 13:12   DG Chest 2 View  Result Date: 08/02/2022 CLINICAL DATA:  coughing up blood EXAM: CHEST - 2 VIEW COMPARISON:  CXR 07/02/21 FINDINGS: No pleural effusion. No pneumothorax. Unchanged cardiac contours. Compared to prior exam there may be a new focal airspace opacity in the left perihilar region. No radiographically apparent displaced rib fractures. Visualized upper is unremarkable. Vertebral body heights are maintained. IMPRESSION: New perihilar opacity on the left could represent infection or atelectasis. Recommend further evaluation with a CT of the chest to exclude the possibility an endobronchial or peribronchial lesion. Electronically Signed   By: Lorenza Cambridge M.D.   On: 08/02/2022 10:06    Microbiology: Results for orders placed or performed during the hospital encounter of 03/19/20  SARS CORONAVIRUS 2 (TAT 6-24 HRS) Nasopharyngeal Nasopharyngeal Swab     Status: None   Collection Time: 03/19/20 11:06 AM   Specimen: Nasopharyngeal Swab  Result Value Ref Range Status   SARS Coronavirus  2 NEGATIVE NEGATIVE Final    Comment: (NOTE) SARS-CoV-2 target nucleic acids are NOT DETECTED.  The SARS-CoV-2 RNA is generally detectable in upper and lower respiratory specimens during the acute phase of infection. Negative results do not preclude SARS-CoV-2 infection, do not rule out co-infections with other pathogens, and should not be used as the sole basis for treatment or other patient management decisions. Negative results must be combined with clinical observations, patient history, and epidemiological information. The expected result is Negative.  Fact Sheet for Patients: HairSlick.no  Fact Sheet for Healthcare Providers: quierodirigir.com  This test is not yet approved or cleared by the [place] FDA and  has been authorized for detection and/or diagnosis of SARS-CoV-2 by FDA under an Emergency Use Authorization (EUA). This EUA will remain  in effect (meaning this test can be used) for the duration of the COVID-19 declaration under Se ction 564(b)(1) of the Act, 21 U.S.C. section 360bbb-3(b)(1), unless the authorization is terminated or revoked sooner.  Performed at [facility], [address], [place], [place] [address]     Labs: CBC: Recent Labs  Lab 08/02/22 0911 08/03/22 0435 08/04/22 0429  WBC 9.7 9.1 7.7  NEUTROABS 8.0*  --   --   HGB 12.5* 12.1* 11.6*  HCT 37.8* 36.9* 35.2*  MCV 92.4 92.9 92.4  PLT 275 276 270   Basic Metabolic Panel: Recent Labs  Lab 08/02/22 0911 08/03/22 0435  NA 135 137  K 4.6 4.0  CL 103 104  CO2 25 24  GLUCOSE 125* 107*  BUN 17 19  CREATININE 0.65 0.62  CALCIUM 9.8 9.1   Liver Function Tests: Recent Labs  Lab 08/02/22 0911  AST 17  ALT 18  ALKPHOS 116  BILITOT 0.5  PROT 6.6  ALBUMIN 3.9   CBG: No results for input(s): "GLUCAP" in the last 168 hours.  Discharge time spent: greater than 30 minutes.  This record has been created using [employer]. Errors have been sought and corrected,but may not always be located. Such creation errors do not reflect on the standard of care.   Signed: Arnetha Courser, MD [facility] 08/05/2022

## 2022-08-05 NOTE — Progress Notes (Signed)
Patient received from Bronchoscopy via bed in stable condition.

## 2022-08-08 ENCOUNTER — Encounter: Payer: Self-pay | Admitting: Pulmonary Disease

## 2022-08-09 ENCOUNTER — Other Ambulatory Visit: Payer: Self-pay | Admitting: Pathology

## 2022-08-09 ENCOUNTER — Encounter: Payer: Self-pay | Admitting: *Deleted

## 2022-08-09 DIAGNOSIS — C3490 Malignant neoplasm of unspecified part of unspecified bronchus or lung: Secondary | ICD-10-CM

## 2022-08-09 LAB — CYTOLOGY - NON PAP

## 2022-08-09 LAB — SURGICAL PATHOLOGY

## 2022-08-09 NOTE — Progress Notes (Signed)
Per Dr. Cathie Hoops pt needs further staging with brain MRI w/wo contrast. Orders placed. Pt will be notified with appt once scheduled.

## 2022-08-11 ENCOUNTER — Inpatient Hospital Stay: Payer: Medicare Other | Attending: Oncology | Admitting: Oncology

## 2022-08-11 ENCOUNTER — Telehealth: Payer: Self-pay

## 2022-08-11 ENCOUNTER — Encounter: Payer: Self-pay | Admitting: Oncology

## 2022-08-11 ENCOUNTER — Other Ambulatory Visit: Payer: Medicare Other

## 2022-08-11 VITALS — BP 130/74 | HR 46 | Temp 97.9°F | Resp 18 | Wt 174.2 lb

## 2022-08-11 DIAGNOSIS — Z7982 Long term (current) use of aspirin: Secondary | ICD-10-CM | POA: Diagnosis not present

## 2022-08-11 DIAGNOSIS — Z7189 Other specified counseling: Secondary | ICD-10-CM

## 2022-08-11 DIAGNOSIS — R042 Hemoptysis: Secondary | ICD-10-CM | POA: Insufficient documentation

## 2022-08-11 DIAGNOSIS — C3492 Malignant neoplasm of unspecified part of left bronchus or lung: Secondary | ICD-10-CM

## 2022-08-11 DIAGNOSIS — Z87891 Personal history of nicotine dependence: Secondary | ICD-10-CM | POA: Diagnosis not present

## 2022-08-11 DIAGNOSIS — C3490 Malignant neoplasm of unspecified part of unspecified bronchus or lung: Secondary | ICD-10-CM

## 2022-08-11 DIAGNOSIS — Z79899 Other long term (current) drug therapy: Secondary | ICD-10-CM | POA: Diagnosis not present

## 2022-08-11 DIAGNOSIS — C3432 Malignant neoplasm of lower lobe, left bronchus or lung: Secondary | ICD-10-CM | POA: Insufficient documentation

## 2022-08-11 MED ORDER — LIDOCAINE-PRILOCAINE 2.5-2.5 % EX CREA
TOPICAL_CREAM | CUTANEOUS | 3 refills | Status: DC
Start: 2022-08-11 — End: 2022-11-03

## 2022-08-11 MED ORDER — PREDNISONE 10 MG (21) PO TBPK: ORAL_TABLET | ORAL | 0 refills | Status: DC

## 2022-08-11 MED ORDER — LEVOFLOXACIN 500 MG PO TABS: 500.0000 mg | ORAL_TABLET | Freq: Every day | ORAL | 0 refills | Status: DC

## 2022-08-11 MED ORDER — PROCHLORPERAZINE MALEATE 10 MG PO TABS
10.0000 mg | ORAL_TABLET | Freq: Four times a day (QID) | ORAL | 1 refills | Status: DC | PRN
Start: 2022-08-11 — End: 2022-11-03

## 2022-08-11 MED ORDER — ONDANSETRON HCL 8 MG PO TABS
8.0000 mg | ORAL_TABLET | Freq: Three times a day (TID) | ORAL | 1 refills | Status: DC | PRN
Start: 2022-08-11 — End: 2022-11-03

## 2022-08-11 NOTE — Assessment & Plan Note (Signed)
tranexamic acid nebulizer cannot be given outpatient.  He is not interested in going to emergency room for evaluation today. Recommend patient to stop aspirin and continue monitoring.  If he develops any shortness of breath, or further worsening of hemoptysis I recommend patient to go to emergency room for treatments. I discussed with pulmonology, recommend prednisone Dosepak tapering, as well as Levaquin, prescription sent to pharmacy.

## 2022-08-11 NOTE — Telephone Encounter (Signed)
Call made to pt, but he was napping so I spoke to wife. Informed her that Dr. Cathie Hoops discussed with Lung doctor about nebulizer and it cannot be given outpatient. Informed her that Dr. Cathie Hoops  has sent antbiotics and steroids to his pharmacy, to help ease off hemoptysis. She verbalizd understanding. She staes that they will follow up with Dr. Karna Christmas on 7/2.

## 2022-08-11 NOTE — Progress Notes (Signed)
Hematology/Oncology Progress note [phone] [phone]        REFERRING PROVIDER: Lauro Regulus, MD    CHIEF COMPLAINTS/PURPOSE OF CONSULTATION:  Squamous cell lung cancer   ASSESSMENT & PLAN:   Cancer Staging  Squamous cell carcinoma lung (HCC) Staging form: Lung, AJCC 8th Edition - Clinical stage from 08/11/2022: Stage IIIA (cT4, cN1, cM0) - Signed by Rickard Patience, MD on 08/11/2022   Squamous cell carcinoma lung (HCC) Bronchoscopy biopsy results were reviewed and discussed with patient. Commend MRI brain with and without contrast as well as PET scan.  If no additional sites of metastasis is discovered on PET/MRI, patient has stage III squamous cell carcinoma.  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  I recommend concurrent chemoradiation with weekly carboplatin [AUC 2] and Taxol 45mg /m2 followed by systemic immunotherapy with durvalumab. Chemotherapy education was provided.  We had discussed the composition of chemotherapy regimen, length of chemo cycle, duration of treatment and the time to assess response to treatment.  I explained to the patient the risks and benefits of chemotherapy with carboplatin and Taxol  including all but not limited to hair loss, mouth sore, nausea, vomiting, low blood counts, bleeding, infusion reactions and risk of life threatening infection and even death, secondary malignancy etc.  Risk of neuropathy is associated with Taxol. Marland Kitchen  .Patient voices understanding and willing to proceed chemotherapy and immunotherapy.  I will also obtain NGS #  Chemotherapy education; option of Mediport placement was discussed with patient and he is interested in having Mediport placed.  Refer to vascular surgeon.Marland Kitchen Antiemetics-Zofran and Compazine; EMLA cream sent to pharmacy Refer to radiation oncology   Hemoptysis  tranexamic acid nebulizer cannot be given outpatient.  He is not  interested in going to emergency room for evaluation today. Recommend patient to stop aspirin and continue monitoring.  If he develops any shortness of breath, or further worsening of hemoptysis I recommend patient to go to emergency room for treatments. I discussed with pulmonology, recommend prednisone Dosepak tapering, as well as Levaquin, prescription sent to pharmacy.   Goals of care, counseling/discussion Discussed with patient and wife.   Orders Placed This Encounter  Procedures   NM PET Image Initial (PI) Skull Base To Thigh    ASAP    Standing Status:   Future    Standing Expiration Date:   08/11/2023    Order Specific Question:   If indicated for the ordered procedure, I authorize the administration of a radiopharmaceutical per Radiology protocol    Answer:   Yes    Order Specific Question:   Preferred imaging location?    Answer:   [facility]   Ambulatory referral to Vascular Surgery    Standing Status:   Future    Standing Expiration Date:   08/11/2023    Referral Priority:   Routine    Referral Type:   Surgical    Referral Reason:   Specialty Services Required    Requested Specialty:   Vascular Surgery    Number of Visits Requested:   1   Ambulatory referral to Radiation Oncology    Standing Status:   Future    Standing Expiration Date:   10/11/2022    Referral Priority:   Routine    Referral Type:   Consultation    Referral Reason:   Specialty Services Required    Requested Specialty:   Radiation Oncology    Number of  Visits Requested:   1   Follow-up to be determined. To start chemotherapy when patient starts radiation. All questions were answered. The patient knows to call the clinic with any problems, questions or concerns.  Rickard Patience, MD, PhD [facility] Hematology Oncology 08/11/2022    HISTORY OF PRESENTING ILLNESS:  Wesley Young 82 y.o. male presents to establish care for  I have reviewed his chart and materials related to his cancer extensively  and collaborated history with the patient. Summary of oncologic history is as follows: Oncology History  Squamous cell carcinoma lung (HCC)  08/02/2022 Initial Diagnosis   Squamous cell carcinoma lung (HCC)  07/18/2022, patient developed hemoptysis nitially very small amount. Stopped for 3 days and again patient experienced hemoptysis which prompted him to go to emergency room on 08/02/2022 for further evaluation. CT findings showed 7.1 x 6.0 x 4.4 cm partially necrotic central left lung mass, extensive left hilar adenopathy and borderline enlarged prevascular and subcarinal lymph nodes.  08/05/2022 patient underwent biopsy via bronchoscopy by Dr. Karna Christmas. Left lower lobe ENB assisted biopsy showed squamous cell carcinoma. Left lower lobe lavage-suspicious for malignancy Station 10 R lymph node FNA negative for malignancy Left lower lobe bronchoscopy with brushing-positive for malignancy-non-small cell carcinoma Left lower lobe bronchoscopy with FNA positive for malignancy-non-small cell carcinoma Station 7 lymph node FNA-negative Station 10 L lymph node FNA suspicious for malignancy     08/02/2022 Imaging   CT chest with contrast showed 1. 7.1 x 6.0 x 4.4 cm partially necrotic central left lung mass involving both the inferior aspect of the left upper lobe and the superior aspect of the left lower lobe. Findings consistent with a primary neoplasm. Recommend bronchoscopic biopsy. 2. Extensive left hilar adenopathy and borderline enlarged prevascular and subcarinal lymph nodes. 3. No worrisome pulmonary nodules to suggest pulmonary metastatic disease. 4. No findings for upper abdominal metastatic disease or osseous metastatic disease. 5. Age related atherosclerotic calcifications involving the thoracic and abdominal aorta and branch vessels including the coronary arteries. 6. Aortic atherosclerosis.  CT abdomen pelvis with contrast showed no evidence of primary malignancy or metastatic  disease in the abdomen/pelvis.  Enlarged prostate.  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  Chronic pancreatitis.      08/11/2022 Cancer Staging   Staging form: Lung, AJCC 8th Edition - Clinical stage from 08/11/2022: Stage IIIA (cT4, cN1, cM0) - Signed by Rickard Patience, MD on 08/11/2022 Stage prefix: Initial diagnosis    Patient is a former smoker.  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  Patient had a history of mustard gas exposure of right extremity during service.  In 1977, patient received cobalt radiation and surgery and since then has remained in remission. 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  He has history of uncontrolled bowel and family history of lumbar radiculopathy.  Longstanding general severe sensorimotor peripheral neuropathy.  He walks with a walker at baseline.   Today patient presents to discuss biopsy results and management plan. Accompanied with his wife. + Hemoptysis, during his admission, hemoptysis resolved. After bronchoscopy, he continues to have hemoptysis.  This morning he coughed out a cup of blood. He is currently on aspirin 81 mg daily, per patient's wife, recommended by Dr. Karna Christmas for DVT prophylaxis. + cough.  Otherwise he has no new complaints.  MEDICAL HISTORY:  Past Medical History:  Diagnosis Date   Abnormal PSA    s/p post prostate biopsy in the past which was negative   Actinic keratosis 03/14/2007   L ant lat neck at base of neck - bx proven    Actinic keratosis 01/08/2014   R calf - bx proven    Alcoholism (HCC)    with prolonged hospitalization 2009 for withdrawal c/b aspiration pneumonia requiring vent   Anxiety    Atrial fibrillation and flutter (HCC)    B12 deficiency    Basal cell carcinoma 03/14/2007    R distal lat tricep near elbow    Basal cell carcinoma 03/06/2014   R calf - excision 04/22/2014   Basal cell carcinoma 08/30/2021   suprasternal area, treated with EDC   Cardiomyopathy (HCC)    mild probably multifactorial secondary to HTn and possible alcohol contribution. 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   L upper back paraspinal - excision    Hx of squamous cell carcinoma of skin 2014   multiple sites   Hypertension    Impotence    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HCC)    Sleep apnea    Spinal stenosis    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   L forearm - excision    Squamous cell carcinoma of skin 12/16/2015   R dorsum hand    Squamous cell carcinoma of skin 10/02/2017   R dorsum hand    Varicose veins of both lower extremities     SURGICAL HISTORY: Past Surgical History:  Procedure Laterality Date   CATARACT EXTRACTION W/PHACO Left 03/12/2019   Procedure: CATARACT EXTRACTION PHACO AND INTRAOCULAR LENS PLACEMENT (IOC) LEFT 5.27 00:38.6;  Surgeon: Galen Manila, MD;  Location: [facility];  Service: Ophthalmology;  Laterality: Left;  sleep apnea   CATARACT EXTRACTION W/PHACO Right 04/09/2019   Procedure: CATARACT EXTRACTION PHACO AND INTRAOCULAR LENS PLACEMENT (IOC) RIGHT 4.84 00:52.4;  Surgeon: Galen Manila, MD;  Location: [facility];  Service: Ophthalmology;  Laterality: Right;   colonoscopy with polypectomy  12/2016   COLONOSCOPY WITH PROPOFOL N/A 12/27/2016   Procedure: COLONOSCOPY WITH  PROPOFOL;  Surgeon: Toledo, Boykin Nearing, MD;  Location: [facility];  Service: Endoscopy;  Laterality: N/A;   COLONOSCOPY WITH PROPOFOL N/A 03/23/2020   Procedure: COLONOSCOPY WITH PROPOFOL;  Surgeon: Toledo, Boykin Nearing, MD;  Location: [facility];  Service: Gastroenterology;  Laterality: N/A;   ESOPHAGOGASTRODUODENOSCOPY (EGD) WITH PROPOFOL N/A 09/21/2017   Procedure: ESOPHAGOGASTRODUODENOSCOPY (EGD) WITH PROPOFOL;  Surgeon: Christena Deem, MD;  Location: [facility];  Service: Endoscopy;  Laterality: N/A;   ESOPHAGOGASTRODUODENOSCOPY (EGD) WITH PROPOFOL N/A 03/23/2020   Procedure: ESOPHAGOGASTRODUODENOSCOPY (EGD) WITH PROPOFOL;  Surgeon: Toledo, Boykin Nearing, MD;  Location: [facility];  Service: Gastroenterology;  Laterality: N/A;   melonoma removal     MOHS SURGERY     on top of head   prostat biopsy x2  right deltoid resection in 1977     s/p resection of his right deltoid muscle in 1977     TONSILLECTOMY     TONSILLECTOMY AND ADENOIDECTOMY     VIDEO BRONCHOSCOPY WITH ENDOBRONCHIAL ULTRASOUND N/A 08/05/2022   Procedure: VIDEO BRONCHOSCOPY WITH ENDOBRONCHIAL ULTRASOUND;  Surgeon: Vida Rigger, MD;  Location: [facility];  Service: Thoracic;  Laterality: N/A;    SOCIAL HISTORY: Social History   Socioeconomic History   Marital status: Married    Spouse name: Not on file   Number of children: Not on file   Years of education: Not on file   Highest education level: Not on file  Occupational History   Not on file  Tobacco Use   Smoking status: Former    Years: 20    Types: Pipe, Cigarettes    Quit date: 1977    Years since quitting: 47.4   Smokeless tobacco: Never  Vaping Use   Vaping Use: Never used  Substance and Sexual Activity   Alcohol use: Not Currently    Comment: quit 03/26/2008   Drug use: Not Currently   Sexual activity: Not Currently  Other Topics Concern   Not on file  Social History Narrative   Not on file   Social Determinants of Health    Financial Resource Strain: Not on file  Food Insecurity: No Food Insecurity (08/02/2022)   Hunger Vital Sign    Worried About Running Out of Food in the Last Year: Never true    Ran Out of Food in the Last Year: Never true  Transportation Needs: No Transportation Needs (08/02/2022)   PRAPARE - Administrator, Civil Service (Medical): No    Lack of Transportation (Non-Medical): No  Physical Activity: Not on file  Stress: Not on file  Social Connections: Not on file  Intimate Partner Violence: Not At Risk (08/02/2022)   Humiliation, Afraid, Rape, and Kick questionnaire    Fear of Current or Ex-Partner: No    Emotionally Abused: No    Physically Abused: No    Sexually Abused: No    FAMILY HISTORY: Family History  Problem Relation Age of Onset   Ovarian cancer Mother    Heart attack Father    Emphysema Sister    Obesity Paternal Uncle     ALLERGIES:  is allergic to ativan [lorazepam], benadryl [diphenhydramine], crestor [rosuvastatin calcium], nsaids, rapaflo [silodosin], statins, zetia [ezetimibe], latex, and neosporin [neomycin-bacitracin zn-polymyx].  MEDICATIONS:  Current Outpatient Medications  Medication Sig Dispense Refill   acetaminophen (TYLENOL) 325 MG tablet Take 650 mg by mouth every 6 (six) hours as needed.     amoxicillin-clavulanate (AUGMENTIN) 500-125 MG tablet Take 500 mg by mouth in the morning and at bedtime.     Apoaequorin (PREVAGEN PO) Take by mouth daily.     Ascorbic Acid (VITAMIN C PO) Take 500 mg by mouth daily.     aspirin EC 81 MG tablet Take 81 mg by mouth daily. Swallow whole.     benzonatate (TESSALON) 200 MG capsule Take 1 capsule (200 mg total) by mouth 3 (three) times daily as needed for cough. 20 capsule 0   Calcium Citrate-Vitamin D (CITRACAL + D PO) Take by mouth 2 (two) times daily.     cyanocobalamin (VITAMIN B12) 1000 MCG/ML injection Inject 1,000 mcg into the muscle every 30 (thirty) days.     donepezil (ARICEPT) 5 MG tablet  Take 5 mg by mouth in the morning.     ferrous sulfate 324 MG  TBEC Take 325 mg by mouth daily with breakfast.     finasteride (PROSCAR) 5 MG tablet Take 5 mg daily by mouth.     KRILL OIL PO Take by mouth daily.     levofloxacin (LEVAQUIN) 500 MG tablet Take 1 tablet (500 mg total) by mouth daily. 5 tablet 0   losartan (COZAAR) 50 MG tablet Take 50 mg by mouth daily.     polyethylene glycol (MIRALAX / GLYCOLAX) 17 g packet Take 17 g by mouth daily as needed.     pramipexole (MIRAPEX) 0.5 MG tablet Take 0.5 mg by mouth at bedtime.     predniSONE (STERAPRED UNI-PAK 21 TAB) 10 MG (21) TBPK tablet Day 1 take 6 tablets, Day 2 take 5 tablets, Day 3 take 4 tablets, Day 4 take 3 tablets, Day 5 take 2 tablets D6 take 1 tablet 1 each 0   Probiotic Product (ALIGN PO) Take daily by mouth.     tamsulosin (FLOMAX) 0.4 MG CAPS capsule Take 0.4 mg by mouth daily.     Thiamine HCl (VITAMIN B1 PO) Take 1 tablet by mouth daily.     traMADol (ULTRAM) 50 MG tablet Take 50 mg every 6 (six) hours as needed by mouth.     Misc Natural Products (TART CHERRY ADVANCED PO) Take by mouth daily. (Patient not taking: Reported on 08/11/2022)     No current facility-administered medications for this visit.    Review of Systems  Constitutional:  Positive for fatigue. Negative for appetite change, chills, fever and unexpected weight change.  HENT:   Negative for hearing loss and voice change.   Eyes:  Negative for eye problems and icterus.  Respiratory:  Negative for chest tightness, cough and shortness of breath.        Hemoptysis  Cardiovascular:  Negative for chest pain and leg swelling.  Gastrointestinal:  Negative for abdominal distention and abdominal pain.  Endocrine: Negative for hot flashes.  Genitourinary:  Negative for difficulty urinating, dysuria and frequency.   Musculoskeletal:  Positive for arthralgias.  Skin:  Negative for itching and rash.  Neurological:  Positive for extremity weakness (Chronic).  Negative for light-headedness and numbness.  Hematological:  Negative for adenopathy. Does not bruise/bleed easily.  Psychiatric/Behavioral:  Negative for confusion.      PHYSICAL EXAMINATION: ECOG PERFORMANCE STATUS: 1 - Symptomatic but completely ambulatory  Vitals:   08/11/22 1119  BP: 130/74  Pulse: (!) 46  Resp: 18  Temp: 97.9 F (36.6 C)   Filed Weights   08/11/22 1119  Weight: 174 lb 3.2 oz (79 kg)    Physical Exam Constitutional:      General: He is not in acute distress.    Appearance: He is not diaphoretic.  HENT:     Head: Normocephalic and atraumatic.     Nose: Nose normal.     Mouth/Throat:     Pharynx: No oropharyngeal exudate.  Eyes:     General: No scleral icterus.    Pupils: Pupils are equal, round, and reactive to light.  Cardiovascular:     Rate and Rhythm: Normal rate.     Heart sounds: No murmur heard. Pulmonary:     Effort: Pulmonary effort is normal. No respiratory distress.  Abdominal:     General: There is no distension.  Musculoskeletal:        General: Normal range of motion.     Cervical back: Normal range of motion and neck supple.  Skin:    General: Skin is warm  and dry.  Neurological:     Mental Status: He is alert and oriented to person, place, and time. Mental status is at baseline.     Cranial Nerves: No cranial nerve deficit.     Motor: No abnormal muscle tone.  Psychiatric:        Mood and Affect: Mood and affect normal.      LABORATORY DATA:  I have reviewed the data as listed    Latest Ref Rng & Units 08/04/2022    4:29 AM 08/03/2022    4:35 AM 08/02/2022    9:11 AM  CBC  WBC 4.0 - 10.5 K/uL 7.7  9.1  9.7   Hemoglobin 13.0 - 17.0 g/dL 16.1  09.6  04.5   Hematocrit 39.0 - 52.0 % 35.2  36.9  37.8   Platelets 150 - 400 K/uL 270  276  275       Latest Ref Rng & Units 08/03/2022    4:35 AM 08/02/2022    9:11 AM 11/18/2021   11:01 AM  CMP  Glucose 70 - 99 mg/dL 409  811  914   BUN 8 - 23 mg/dL 19  17  22     Creatinine 0.61 - 1.24 mg/dL 7.82  9.56  2.13   Sodium 135 - 145 mmol/L 137  135  138   Potassium 3.5 - 5.1 mmol/L 4.0  4.6  4.4   Chloride 98 - 111 mmol/L 104  103  104   CO2 22 - 32 mmol/L 24  25  27    Calcium 8.9 - 10.3 mg/dL 9.1  9.8  9.9   Total Protein 6.5 - 8.1 g/dL  6.6  7.1   Total Bilirubin 0.3 - 1.2 mg/dL  0.5  0.7   Alkaline Phos 38 - 126 U/L  116  115   AST 15 - 41 U/L  17  16   ALT 0 - 44 U/L  18  18      RADIOGRAPHIC STUDIES: I have personally reviewed the radiological images as listed and agreed with the findings in the report. DG Chest Port 1 View  Result Date: 08/05/2022 CLINICAL DATA:  Status post bronchoscopy. EXAM: PORTABLE CHEST 1 VIEW COMPARISON:  X-ray 08/02/2022.  CT 08/04/2022 FINDINGS: Focal mass lesion again seen suprahilar on the left. Apical pleural thickening. No pneumothorax, edema or effusion. Minimal opacity in the left lung base. Normal cardiopericardial silhouette. Degenerative changes of the spine. Overlapping cardiac leads. IMPRESSION: No pneumothorax identified post bronchoscopy. Electronically Signed   By: Karen Kays M.D.   On: 08/05/2022 10:42   CT SUPER D CHEST WO MONARCH PILOT  Result Date: 08/05/2022 CLINICAL DATA:  82 year old male with history of non massive hemoptysis with lung mass. EXAM: CT CHEST WITHOUT CONTRAST TECHNIQUE: Multidetector CT imaging of the chest was performed using thin slice collimation for electromagnetic bronchoscopy planning purposes, without intravenous contrast. RADIATION DOSE REDUCTION: This exam was performed according to the departmental dose-optimization program which includes automated exposure control, adjustment of the mA and/or kV according to patient size and/or use of iterative reconstruction technique. COMPARISON:  Chest CT 08/02/2022. FINDINGS: Cardiovascular: Heart size is normal. There is no significant pericardial fluid, thickening or pericardial calcification. There is aortic atherosclerosis, as well as  atherosclerosis of the great vessels of the mediastinum and the coronary arteries, including calcified atherosclerotic plaque in the left main, left anterior descending, left circumflex and right coronary arteries. Calcifications of the aortic valve. Mediastinum/Nodes: Prominent soft tissue in the left hilar region  difficult to discriminate from adjacent vasculature on today's noncontrast CT examination, the corresponding to lymphadenopathy on recent contrast-enhanced study. No other mediastinal or right hilar lymphadenopathy noted. Esophagus is unremarkable in appearance. No axillary lymphadenopathy. Lungs/Pleura: Again noted is a large irregular shaped mass in the medial aspect of the posterior left hemithorax (axial image 77 of series 2 and sagittal image 120 of series 7) estimated to measure approximately 6.0 x 5.9 x 7.8 cm on today's examination. This mass crosses the superior aspect of the left major fissure with involvement of both the superior segment of the left lower lobe and posteromedial aspect of the left upper lobe. Surrounding areas of ground-glass attenuation and septal thickening are noted in the adjacent lung parenchyma, most severe in the left lower lobe, most likely to reflect postobstructive inflammation, although some degree of lymphangitic spread of tumor is not excluded. Thickening of the superior aspect of the left major fissure is noted. Scattered areas of mild cylindrical bronchiectasis and peripheral bronchiolectasis, along with clustered micro nodularity are noted, most evident in the medial aspect of the right upper lobe, and in the right middle lobe, most compatible with areas of chronic post infectious or inflammatory scarring. No pleural effusions. Upper Abdomen: Partially imaged exophytic low-attenuation lesion in the lateral aspect of the upper pole of the left kidney, incompletely characterized on today's noncontrast CT examination, but statistically likely a cyst (no imaging  follow-up recommended) measuring at least 6.7 cm. Aortic atherosclerosis. Calcifications in the head and body of the pancreas, likely sequela of chronic pancreatitis. Musculoskeletal: There are no aggressive appearing lytic or blastic lesions noted in the visualized portions of the skeleton. IMPRESSION: 1. 6.0 x 5.9 x 7.8 cm aggressive appearing mass in the left lung involving portions of both the left upper and lower lobes, highly concerning for primary bronchogenic neoplasm. Previously noted left hilar lymphadenopathy is not well demonstrated on today's noncontrast CT examination, although left hilar fullness likely reflects malignant nodal involvement. 2. Aortic atherosclerosis, in addition to left main and three-vessel coronary artery disease. 3. There are calcifications of the aortic valve. Echocardiographic correlation for evaluation of potential valvular dysfunction may be warranted if clinically indicated. Aortic Atherosclerosis (ICD10-I70.0). Electronically Signed   By: Trudie Reed M.D.   On: 08/05/2022 08:47   CT ABDOMEN PELVIS W CONTRAST  Result Date: 08/02/2022 CLINICAL DATA:  Metastatic disease evaluation. EXAM: CT ABDOMEN AND PELVIS WITH CONTRAST TECHNIQUE: Multidetector CT imaging of the abdomen and pelvis was performed using the standard protocol following bolus administration of intravenous contrast. RADIATION DOSE REDUCTION: This exam was performed according to the departmental dose-optimization program which includes automated exposure control, adjustment of the mA and/or kV according to patient size and/or use of iterative reconstruction technique. CONTRAST:  80mL OMNIPAQUE IOHEXOL 300 MG/ML  SOLN COMPARISON:  Chest CT performed earlier today. FINDINGS: Lower chest: Assessed on chest CT earlier today. Hepatobiliary: No focal hepatic lesion. Borderline hepatic steatosis. Unremarkable gallbladder. No biliary dilatation. Pancreas: Calcifications in the pancreatic head typical of chronic  pancreatitis. No acute pancreatic inflammation. No ductal dilatation or pancreatic mass. Spleen: Normal in size without focal abnormality. Adrenals/Urinary Tract: No adrenal nodule. There is early excretion of IV contrast in the renal collecting systems which limits assessment for stone. No hydronephrosis. Simple cyst no solid renal lesion. Excreted IV contrast within the urinary bladder. In the left kidney, needing no further imaging follow-up. Stomach/Bowel: No bowel obstruction or inflammation. Moderate volume of colonic stool. Scattered colonic diverticulosis without diverticulitis. Small hiatal hernia. Vascular/Lymphatic:  Aortic atherosclerosis. No aneurysm. Iliac vessels are tortuous. No enlarged lymph nodes in the abdomen or pelvis. Reproductive: Enlarged prostate spans 5.1 cm. Other: No ascites. No omental thickening. No subcutaneous lesions. Musculoskeletal: Diffuse lumbar degenerative change. No lytic or blastic osseous lesions. IMPRESSION: 1. No evidence of primary malignancy or metastatic disease in the abdomen/pelvis. 2. Colonic diverticulosis without diverticulitis. 3. Enlarged prostate. 4. Chronic pancreatitis. Aortic Atherosclerosis (ICD10-I70.0). Electronically Signed   By: Narda Rutherford M.D.   On: 08/02/2022 15:41   CT Chest W Contrast  Result Date: 08/02/2022 CLINICAL DATA:  Followup abnormal chest x-ray. EXAM: CT CHEST WITH CONTRAST TECHNIQUE: Multidetector CT imaging of the chest was performed during intravenous contrast administration. RADIATION DOSE REDUCTION: This exam was performed according to the departmental dose-optimization program which includes automated exposure control, adjustment of the mA and/or kV according to patient size and/or use of iterative reconstruction technique. CONTRAST:  75mL OMNIPAQUE IOHEXOL 300 MG/ML  SOLN COMPARISON:  Chest x-ray, same date. FINDINGS: Cardiovascular: The heart is normal in size. No pericardial effusion. Age related atherosclerotic  calcifications involving the aorta but no aneurysm or dissection. The branch vessels are patent. Three-vessel coronary artery calcifications are noted. Mediastinum/Nodes: Extensive left hilar adenopathy. There is also a borderline enlarged prevascular node measuring 9 mm on image 82/2. Left-sided subcarinal node measures 11 mm on image 88/2. No contralateral mediastinal adenopathy. The esophagus is grossly. Lungs/Pleura: Large partially necrotic central left lung mass involving both the inferior aspect of the left upper lobe and the superior aspect of the left lower lobe. It measures approximately 7.1 x 6.0 x 4.4 cm. It surrounds the descending thoracic aorta. Findings consistent with a primary neoplasm. Recommend bronchoscopic biopsy. Interstitial changes surrounding lesion peripherally in the left lower lobe could be obstructive pneumonitis and atelectasis. No worrisome pulmonary nodules to suggest pulmonary metastatic disease. No pleural effusions or pleural nodules. Upper Abdomen: No significant upper abdominal findings. No hepatic or adrenal gland lesions. No upper abdominal adenopathy. Simple 6 cm nonenhancing left renal cyst not requiring any further imaging evaluation or follow-up. Moderate age related atherosclerotic calcifications involving the abdominal aorta and branch vessels. Musculoskeletal: No chest wall mass, supraclavicular or axillary adenopathy. The thyroid gland is unremarkable. No worrisome bone lesions to suggest metastatic disease. Advanced degenerative changes involving the right shoulder. IMPRESSION: 1. 7.1 x 6.0 x 4.4 cm partially necrotic central left lung mass involving both the inferior aspect of the left upper lobe and the superior aspect of the left lower lobe. Findings consistent with a primary neoplasm. Recommend bronchoscopic biopsy. 2. Extensive left hilar adenopathy and borderline enlarged prevascular and subcarinal lymph nodes. 3. No worrisome pulmonary nodules to suggest  pulmonary metastatic disease. 4. No findings for upper abdominal metastatic disease or osseous metastatic disease. 5. Age related atherosclerotic calcifications involving the thoracic and abdominal aorta and branch vessels including the coronary arteries. 6. Aortic atherosclerosis. Aortic Atherosclerosis (ICD10-I70.0). Electronically Signed   By: Rudie Meyer M.D.   On: 08/02/2022 13:12   DG Chest 2 View  Result Date: 08/02/2022 CLINICAL DATA:  coughing up blood EXAM: CHEST - 2 VIEW COMPARISON:  CXR 07/02/21 FINDINGS: No pleural effusion. No pneumothorax. Unchanged cardiac contours. Compared to prior exam there may be a new focal airspace opacity in the left perihilar region. No radiographically apparent displaced rib fractures. Visualized upper is unremarkable. Vertebral body heights are maintained. IMPRESSION: New perihilar opacity on the left could represent infection or atelectasis. Recommend further evaluation with a CT of the chest to exclude the  possibility an endobronchial or peribronchial lesion. Electronically Signed   By: Lorenza Cambridge M.D.   On: 08/02/2022 10:06

## 2022-08-11 NOTE — H&P (View-Only) (Signed)
Hematology/Oncology Progress note [phone] [phone]        REFERRING PROVIDER: Anderson, Marshall W, MD    CHIEF COMPLAINTS/PURPOSE OF CONSULTATION:  Squamous cell lung cancer   ASSESSMENT & PLAN:   Cancer Staging  Squamous cell carcinoma lung (HCC) Staging form: Lung, AJCC 8th Edition - Clinical stage from 08/11/2022: Stage IIIA (cT4, cN1, cM0) - Signed by Tonjia Parillo, MD on 08/11/2022   Squamous cell carcinoma lung (HCC) Bronchoscopy biopsy results were reviewed and discussed with patient. Commend MRI brain with and without contrast as well as PET scan.  If no additional sites of metastasis is discovered on PET/MRI, patient has stage III squamous cell carcinoma.  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  I recommend concurrent chemoradiation with weekly carboplatin [AUC 2] and Taxol 45mg/m2 followed by systemic immunotherapy with durvalumab. Chemotherapy education was provided.  We had discussed the composition of chemotherapy regimen, length of chemo cycle, duration of treatment and the time to assess response to treatment.  I explained to the patient the risks and benefits of chemotherapy with carboplatin and Taxol  including all but not limited to hair loss, mouth sore, nausea, vomiting, low blood counts, bleeding, infusion reactions and risk of life threatening infection and even death, secondary malignancy etc.  Risk of neuropathy is associated with Taxol. .  .Patient voices understanding and willing to proceed chemotherapy and immunotherapy.  I will also obtain NGS #  Chemotherapy education; option of Mediport placement was discussed with patient and he is interested in having Mediport placed.  Refer to vascular surgeon.. Antiemetics-Zofran and Compazine; EMLA cream sent to pharmacy Refer to radiation oncology   Hemoptysis  tranexamic acid nebulizer cannot be given outpatient.  He is not  interested in going to emergency room for evaluation today. Recommend patient to stop aspirin and continue monitoring.  If he develops any shortness of breath, or further worsening of hemoptysis I recommend patient to go to emergency room for treatments. I discussed with pulmonology, recommend prednisone Dosepak tapering, as well as Levaquin, prescription sent to pharmacy.   Goals of care, counseling/discussion Discussed with patient and wife.   Orders Placed This Encounter  Procedures   NM PET Image Initial (PI) Skull Base To Thigh    ASAP    Standing Status:   Future    Standing Expiration Date:   08/11/2023    Order Specific Question:   If indicated for the ordered procedure, I authorize the administration of a radiopharmaceutical per Radiology protocol    Answer:   Yes    Order Specific Question:   Preferred imaging location?    Answer:   [facility]   Ambulatory referral to Vascular Surgery    Standing Status:   Future    Standing Expiration Date:   08/11/2023    Referral Priority:   Routine    Referral Type:   Surgical    Referral Reason:   Specialty Services Required    Requested Specialty:   Vascular Surgery    Number of Visits Requested:   1   Ambulatory referral to Radiation Oncology    Standing Status:   Future    Standing Expiration Date:   10/11/2022    Referral Priority:   Routine    Referral Type:   Consultation    Referral Reason:   Specialty Services Required    Requested Specialty:   Radiation Oncology    Number of   Visits Requested:   1   Follow-up to be determined. To start chemotherapy when patient starts radiation. All questions were answered. The patient knows to call the clinic with any problems, questions or concerns.  Laela Deviney, MD, PhD [facility] 08/11/2022    HISTORY OF PRESENTING ILLNESS:  Conway W Grainger 82 y.o. male presents to establish care for  I have reviewed his chart and materials related to his cancer extensively  and collaborated history with the patient. Summary of oncologic history is as follows: Oncology History  Squamous cell carcinoma lung (HCC)  08/02/2022 Initial Diagnosis   Squamous cell carcinoma lung (HCC)  07/18/2022, patient developed hemoptysis nitially very small amount. Stopped for 3 days and again patient experienced hemoptysis which prompted him to go to emergency room on 08/02/2022 for further evaluation. CT findings showed 7.1 x 6.0 x 4.4 cm partially necrotic central left lung mass, extensive left hilar adenopathy and borderline enlarged prevascular and subcarinal lymph nodes.  08/05/2022 patient underwent biopsy via bronchoscopy by Dr. Aleskerov. Left lower lobe ENB assisted biopsy showed squamous cell carcinoma. Left lower lobe lavage-suspicious for malignancy Station 10 R lymph node FNA negative for malignancy Left lower lobe bronchoscopy with brushing-positive for malignancy-non-small cell carcinoma Left lower lobe bronchoscopy with FNA positive for malignancy-non-small cell carcinoma Station 7 lymph node FNA-negative Station 10 L lymph node FNA suspicious for malignancy     08/02/2022 Imaging   CT chest with contrast showed 1. 7.1 x 6.0 x 4.4 cm partially necrotic central left lung mass involving both the inferior aspect of the left upper lobe and the superior aspect of the left lower lobe. Findings consistent with a primary neoplasm. Recommend bronchoscopic biopsy. 2. Extensive left hilar adenopathy and borderline enlarged prevascular and subcarinal lymph nodes. 3. No worrisome pulmonary nodules to suggest pulmonary metastatic disease. 4. No findings for upper abdominal metastatic disease or osseous metastatic disease. 5. Age related atherosclerotic calcifications involving the thoracic and abdominal aorta and branch vessels including the coronary arteries. 6. Aortic atherosclerosis.  CT abdomen pelvis with contrast showed no evidence of primary malignancy or metastatic  disease in the abdomen/pelvis.  Enlarged prostate.  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  Chronic pancreatitis.      08/11/2022 Cancer Staging   Staging form: Lung, AJCC 8th Edition - Clinical stage from 08/11/2022: Stage IIIA (cT4, cN1, cM0) - Signed by Joliet Mallozzi, MD on 08/11/2022 Stage prefix: Initial diagnosis    Patient is a former smoker.  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  Patient had a history of mustard gas exposure of right extremity during service.  In 1977, patient received cobalt radiation and surgery and since then has remained in remission. 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  He has history of uncontrolled bowel and family history of lumbar radiculopathy.  Longstanding general severe sensorimotor peripheral neuropathy.  He walks with a walker at baseline.   Today patient presents to discuss biopsy results and management plan. Accompanied with his wife. + Hemoptysis, during his admission, hemoptysis resolved. After bronchoscopy, he continues to have hemoptysis.  This morning he coughed out a cup of blood. He is currently on aspirin 81 mg daily, per patient's wife, recommended by Dr. Aleskerov for DVT prophylaxis. + cough.  Otherwise he has no new complaints.  MEDICAL HISTORY:    Past Medical History:  Diagnosis Date   Abnormal PSA    s/p post prostate biopsy in the past which was negative   Actinic keratosis 03/14/2007   L ant lat neck at base of neck - bx proven    Actinic keratosis 01/08/2014   R calf - bx proven    Alcoholism (HCC)    with prolonged hospitalization 2009 for withdrawal c/b aspiration pneumonia requiring vent   Anxiety    Atrial fibrillation and flutter (HCC)    B12 deficiency    Basal cell carcinoma 03/14/2007    R distal lat tricep near elbow    Basal cell carcinoma 03/06/2014   R calf - excision 04/22/2014   Basal cell carcinoma 08/30/2021   suprasternal area, treated with EDC   Cardiomyopathy (HCC)    mild probably multifactorial secondary to HTn and possible alcohol contribution. 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   L upper back paraspinal - excision    Hx of squamous cell carcinoma of skin 2014   multiple sites   Hypertension    Impotence    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HCC)    Sleep apnea    Spinal stenosis    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   L forearm - excision    Squamous cell carcinoma of skin 12/16/2015   R dorsum hand    Squamous cell carcinoma of skin 10/02/2017   R dorsum hand    Varicose veins of both lower extremities     SURGICAL HISTORY: Past Surgical History:  Procedure Laterality Date   CATARACT EXTRACTION W/PHACO Left 03/12/2019   Procedure: CATARACT EXTRACTION PHACO AND INTRAOCULAR LENS PLACEMENT (IOC) LEFT 5.27 00:38.6;  Surgeon: Porfilio, William, MD;  Location: [facility];  Service: Ophthalmology;  Laterality: Left;  sleep apnea   CATARACT EXTRACTION W/PHACO Right 04/09/2019   Procedure: CATARACT EXTRACTION PHACO AND INTRAOCULAR LENS PLACEMENT (IOC) RIGHT 4.84 00:52.4;  Surgeon: Porfilio, William, MD;  Location: [facility];  Service: Ophthalmology;  Laterality: Right;   colonoscopy with polypectomy  12/2016   COLONOSCOPY WITH PROPOFOL N/A 12/27/2016   Procedure: COLONOSCOPY WITH  PROPOFOL;  Surgeon: Toledo, Teodoro K, MD;  Location: [facility];  Service: Endoscopy;  Laterality: N/A;   COLONOSCOPY WITH PROPOFOL N/A 03/23/2020   Procedure: COLONOSCOPY WITH PROPOFOL;  Surgeon: Toledo, Teodoro K, MD;  Location: [facility];  Service: Gastroenterology;  Laterality: N/A;   ESOPHAGOGASTRODUODENOSCOPY (EGD) WITH PROPOFOL N/A 09/21/2017   Procedure: ESOPHAGOGASTRODUODENOSCOPY (EGD) WITH PROPOFOL;  Surgeon: Skulskie, Martin U, MD;  Location: [facility];  Service: Endoscopy;  Laterality: N/A;   ESOPHAGOGASTRODUODENOSCOPY (EGD) WITH PROPOFOL N/A 03/23/2020   Procedure: ESOPHAGOGASTRODUODENOSCOPY (EGD) WITH PROPOFOL;  Surgeon: Toledo, Teodoro K, MD;  Location: [facility];  Service: Gastroenterology;  Laterality: N/A;   melonoma removal     MOHS SURGERY     on top of head   prostat biopsy x2       right deltoid resection in 1977     s/p resection of his right deltoid muscle in 1977     TONSILLECTOMY     TONSILLECTOMY AND ADENOIDECTOMY     VIDEO BRONCHOSCOPY WITH ENDOBRONCHIAL ULTRASOUND N/A 08/05/2022   Procedure: VIDEO BRONCHOSCOPY WITH ENDOBRONCHIAL ULTRASOUND;  Surgeon: Aleskerov, Fuad, MD;  Location: [facility];  Service: Thoracic;  Laterality: N/A;    SOCIAL HISTORY: Social History   Socioeconomic History   Marital status: Married    Spouse name: Not on file   Number of children: Not on file   Years of education: Not on file   Highest education level: Not on file  Occupational History   Not on file  Tobacco Use   Smoking status: Former    Years: 20    Types: Pipe, Cigarettes    Quit date: 1977    Years since quitting: 47.4   Smokeless tobacco: Never  Vaping Use   Vaping Use: Never used  Substance and Sexual Activity   Alcohol use: Not Currently    Comment: quit 03/26/2008   Drug use: Not Currently   Sexual activity: Not Currently  Other Topics Concern   Not on file  Social History Narrative   Not on file   Social Determinants of Health    Financial Resource Strain: Not on file  Food Insecurity: No Food Insecurity (08/02/2022)   Hunger Vital Sign    Worried About Running Out of Food in the Last Year: Never true    Ran Out of Food in the Last Year: Never true  Transportation Needs: No Transportation Needs (08/02/2022)   PRAPARE - Transportation    Lack of Transportation (Medical): No    Lack of Transportation (Non-Medical): No  Physical Activity: Not on file  Stress: Not on file  Social Connections: Not on file  Intimate Partner Violence: Not At Risk (08/02/2022)   Humiliation, Afraid, Rape, and Kick questionnaire    Fear of Current or Ex-Partner: No    Emotionally Abused: No    Physically Abused: No    Sexually Abused: No    FAMILY HISTORY: Family History  Problem Relation Age of Onset   Ovarian cancer Mother    Heart attack Father    Emphysema Sister    Obesity Paternal Uncle     ALLERGIES:  is allergic to ativan [lorazepam], benadryl [diphenhydramine], crestor [rosuvastatin calcium], nsaids, rapaflo [silodosin], statins, zetia [ezetimibe], latex, and neosporin [neomycin-bacitracin zn-polymyx].  MEDICATIONS:  Current Outpatient Medications  Medication Sig Dispense Refill   acetaminophen (TYLENOL) 325 MG tablet Take 650 mg by mouth every 6 (six) hours as needed.     amoxicillin-clavulanate (AUGMENTIN) 500-125 MG tablet Take 500 mg by mouth in the morning and at bedtime.     Apoaequorin (PREVAGEN PO) Take by mouth daily.     Ascorbic Acid (VITAMIN C PO) Take 500 mg by mouth daily.     aspirin EC 81 MG tablet Take 81 mg by mouth daily. Swallow whole.     benzonatate (TESSALON) 200 MG capsule Take 1 capsule (200 mg total) by mouth 3 (three) times daily as needed for cough. 20 capsule 0   Calcium Citrate-Vitamin D (CITRACAL + D PO) Take by mouth 2 (two) times daily.     cyanocobalamin (VITAMIN B12) 1000 MCG/ML injection Inject 1,000 mcg into the muscle every 30 (thirty) days.     donepezil (ARICEPT) 5 MG tablet  Take 5 mg by mouth in the morning.     ferrous sulfate 324 MG   TBEC Take 325 mg by mouth daily with breakfast.     finasteride (PROSCAR) 5 MG tablet Take 5 mg daily by mouth.     KRILL OIL PO Take by mouth daily.     levofloxacin (LEVAQUIN) 500 MG tablet Take 1 tablet (500 mg total) by mouth daily. 5 tablet 0   losartan (COZAAR) 50 MG tablet Take 50 mg by mouth daily.     polyethylene glycol (MIRALAX / GLYCOLAX) 17 g packet Take 17 g by mouth daily as needed.     pramipexole (MIRAPEX) 0.5 MG tablet Take 0.5 mg by mouth at bedtime.     predniSONE (STERAPRED UNI-PAK 21 TAB) 10 MG (21) TBPK tablet Day 1 take 6 tablets, Day 2 take 5 tablets, Day 3 take 4 tablets, Day 4 take 3 tablets, Day 5 take 2 tablets D6 take 1 tablet 1 each 0   Probiotic Product (ALIGN PO) Take daily by mouth.     tamsulosin (FLOMAX) 0.4 MG CAPS capsule Take 0.4 mg by mouth daily.     Thiamine HCl (VITAMIN B1 PO) Take 1 tablet by mouth daily.     traMADol (ULTRAM) 50 MG tablet Take 50 mg every 6 (six) hours as needed by mouth.     Misc Natural Products (TART CHERRY ADVANCED PO) Take by mouth daily. (Patient not taking: Reported on 08/11/2022)     No current facility-administered medications for this visit.    Review of Systems  Constitutional:  Positive for fatigue. Negative for appetite change, chills, fever and unexpected weight change.  HENT:   Negative for hearing loss and voice change.   Eyes:  Negative for eye problems and icterus.  Respiratory:  Negative for chest tightness, cough and shortness of breath.        Hemoptysis  Cardiovascular:  Negative for chest pain and leg swelling.  Gastrointestinal:  Negative for abdominal distention and abdominal pain.  Endocrine: Negative for hot flashes.  Genitourinary:  Negative for difficulty urinating, dysuria and frequency.   Musculoskeletal:  Positive for arthralgias.  Skin:  Negative for itching and rash.  Neurological:  Positive for extremity weakness (Chronic).  Negative for light-headedness and numbness.  Hematological:  Negative for adenopathy. Does not bruise/bleed easily.  Psychiatric/Behavioral:  Negative for confusion.      PHYSICAL EXAMINATION: ECOG PERFORMANCE STATUS: 1 - Symptomatic but completely ambulatory  Vitals:   08/11/22 1119  BP: 130/74  Pulse: (!) 46  Resp: 18  Temp: 97.9 F (36.6 C)   Filed Weights   08/11/22 1119  Weight: 174 lb 3.2 oz (79 kg)    Physical Exam Constitutional:      General: He is not in acute distress.    Appearance: He is not diaphoretic.  HENT:     Head: Normocephalic and atraumatic.     Nose: Nose normal.     Mouth/Throat:     Pharynx: No oropharyngeal exudate.  Eyes:     General: No scleral icterus.    Pupils: Pupils are equal, round, and reactive to light.  Cardiovascular:     Rate and Rhythm: Normal rate.     Heart sounds: No murmur heard. Pulmonary:     Effort: Pulmonary effort is normal. No respiratory distress.  Abdominal:     General: There is no distension.  Musculoskeletal:        General: Normal range of motion.     Cervical back: Normal range of motion and neck supple.  Skin:    General: Skin is warm   and dry.  Neurological:     Mental Status: He is alert and oriented to person, place, and time. Mental status is at baseline.     Cranial Nerves: No cranial nerve deficit.     Motor: No abnormal muscle tone.  Psychiatric:        Mood and Affect: Mood and affect normal.      LABORATORY DATA:  I have reviewed the data as listed    Latest Ref Rng & Units 08/04/2022    4:29 AM 08/03/2022    4:35 AM 08/02/2022    9:11 AM  CBC  WBC 4.0 - 10.5 K/uL 7.7  9.1  9.7   Hemoglobin 13.0 - 17.0 g/dL 11.6  12.1  12.5   Hematocrit 39.0 - 52.0 % 35.2  36.9  37.8   Platelets 150 - 400 K/uL 270  276  275       Latest Ref Rng & Units 08/03/2022    4:35 AM 08/02/2022    9:11 AM 11/18/2021   11:01 AM  CMP  Glucose 70 - 99 mg/dL 107  125  110   BUN 8 - 23 mg/dL 19  17  22    Creatinine 0.61 - 1.24 mg/dL 0.62  0.65  0.75   Sodium 135 - 145 mmol/L 137  135  138   Potassium 3.5 - 5.1 mmol/L 4.0  4.6  4.4   Chloride 98 - 111 mmol/L 104  103  104   CO2 22 - 32 mmol/L 24  25  27   Calcium 8.9 - 10.3 mg/dL 9.1  9.8  9.9   Total Protein 6.5 - 8.1 g/dL  6.6  7.1   Total Bilirubin 0.3 - 1.2 mg/dL  0.5  0.7   Alkaline Phos 38 - 126 U/L  116  115   AST 15 - 41 U/L  17  16   ALT 0 - 44 U/L  18  18      RADIOGRAPHIC STUDIES: I have personally reviewed the radiological images as listed and agreed with the findings in the report. DG Chest Port 1 View  Result Date: 08/05/2022 CLINICAL DATA:  Status post bronchoscopy. EXAM: PORTABLE CHEST 1 VIEW COMPARISON:  X-ray 08/02/2022.  CT 08/04/2022 FINDINGS: Focal mass lesion again seen suprahilar on the left. Apical pleural thickening. No pneumothorax, edema or effusion. Minimal opacity in the left lung base. Normal cardiopericardial silhouette. Degenerative changes of the spine. Overlapping cardiac leads. IMPRESSION: No pneumothorax identified post bronchoscopy. Electronically Signed   By: Ashok  Gupta M.D.   On: 08/05/2022 10:42   CT SUPER D CHEST WO MONARCH PILOT  Result Date: 08/05/2022 CLINICAL DATA:  82-year-old male with history of non massive hemoptysis with lung mass. EXAM: CT CHEST WITHOUT CONTRAST TECHNIQUE: Multidetector CT imaging of the chest was performed using thin slice collimation for electromagnetic bronchoscopy planning purposes, without intravenous contrast. RADIATION DOSE REDUCTION: This exam was performed according to the departmental dose-optimization program which includes automated exposure control, adjustment of the mA and/or kV according to patient size and/or use of iterative reconstruction technique. COMPARISON:  Chest CT 08/02/2022. FINDINGS: Cardiovascular: Heart size is normal. There is no significant pericardial fluid, thickening or pericardial calcification. There is aortic atherosclerosis, as well as  atherosclerosis of the great vessels of the mediastinum and the coronary arteries, including calcified atherosclerotic plaque in the left main, left anterior descending, left circumflex and right coronary arteries. Calcifications of the aortic valve. Mediastinum/Nodes: Prominent soft tissue in the left hilar region   difficult to discriminate from adjacent vasculature on today's noncontrast CT examination, the corresponding to lymphadenopathy on recent contrast-enhanced study. No other mediastinal or right hilar lymphadenopathy noted. Esophagus is unremarkable in appearance. No axillary lymphadenopathy. Lungs/Pleura: Again noted is a large irregular shaped mass in the medial aspect of the posterior left hemithorax (axial image 77 of series 2 and sagittal image 120 of series 7) estimated to measure approximately 6.0 x 5.9 x 7.8 cm on today's examination. This mass crosses the superior aspect of the left major fissure with involvement of both the superior segment of the left lower lobe and posteromedial aspect of the left upper lobe. Surrounding areas of ground-glass attenuation and septal thickening are noted in the adjacent lung parenchyma, most severe in the left lower lobe, most likely to reflect postobstructive inflammation, although some degree of lymphangitic spread of tumor is not excluded. Thickening of the superior aspect of the left major fissure is noted. Scattered areas of mild cylindrical bronchiectasis and peripheral bronchiolectasis, along with clustered micro nodularity are noted, most evident in the medial aspect of the right upper lobe, and in the right middle lobe, most compatible with areas of chronic post infectious or inflammatory scarring. No pleural effusions. Upper Abdomen: Partially imaged exophytic low-attenuation lesion in the lateral aspect of the upper pole of the left kidney, incompletely characterized on today's noncontrast CT examination, but statistically likely a cyst (no imaging  follow-up recommended) measuring at least 6.7 cm. Aortic atherosclerosis. Calcifications in the head and body of the pancreas, likely sequela of chronic pancreatitis. Musculoskeletal: There are no aggressive appearing lytic or blastic lesions noted in the visualized portions of the skeleton. IMPRESSION: 1. 6.0 x 5.9 x 7.8 cm aggressive appearing mass in the left lung involving portions of both the left upper and lower lobes, highly concerning for primary bronchogenic neoplasm. Previously noted left hilar lymphadenopathy is not well demonstrated on today's noncontrast CT examination, although left hilar fullness likely reflects malignant nodal involvement. 2. Aortic atherosclerosis, in addition to left main and three-vessel coronary artery disease. 3. There are calcifications of the aortic valve. Echocardiographic correlation for evaluation of potential valvular dysfunction may be warranted if clinically indicated. Aortic Atherosclerosis (ICD10-I70.0). Electronically Signed   By: Daniel  Entrikin M.D.   On: 08/05/2022 08:47   CT ABDOMEN PELVIS W CONTRAST  Result Date: 08/02/2022 CLINICAL DATA:  Metastatic disease evaluation. EXAM: CT ABDOMEN AND PELVIS WITH CONTRAST TECHNIQUE: Multidetector CT imaging of the abdomen and pelvis was performed using the standard protocol following bolus administration of intravenous contrast. RADIATION DOSE REDUCTION: This exam was performed according to the departmental dose-optimization program which includes automated exposure control, adjustment of the mA and/or kV according to patient size and/or use of iterative reconstruction technique. CONTRAST:  80mL OMNIPAQUE IOHEXOL 300 MG/ML  SOLN COMPARISON:  Chest CT performed earlier today. FINDINGS: Lower chest: Assessed on chest CT earlier today. Hepatobiliary: No focal hepatic lesion. Borderline hepatic steatosis. Unremarkable gallbladder. No biliary dilatation. Pancreas: Calcifications in the pancreatic head typical of chronic  pancreatitis. No acute pancreatic inflammation. No ductal dilatation or pancreatic mass. Spleen: Normal in size without focal abnormality. Adrenals/Urinary Tract: No adrenal nodule. There is early excretion of IV contrast in the renal collecting systems which limits assessment for stone. No hydronephrosis. Simple cyst no solid renal lesion. Excreted IV contrast within the urinary bladder. In the left kidney, needing no further imaging follow-up. Stomach/Bowel: No bowel obstruction or inflammation. Moderate volume of colonic stool. Scattered colonic diverticulosis without diverticulitis. Small hiatal hernia. Vascular/Lymphatic:   Aortic atherosclerosis. No aneurysm. Iliac vessels are tortuous. No enlarged lymph nodes in the abdomen or pelvis. Reproductive: Enlarged prostate spans 5.1 cm. Other: No ascites. No omental thickening. No subcutaneous lesions. Musculoskeletal: Diffuse lumbar degenerative change. No lytic or blastic osseous lesions. IMPRESSION: 1. No evidence of primary malignancy or metastatic disease in the abdomen/pelvis. 2. Colonic diverticulosis without diverticulitis. 3. Enlarged prostate. 4. Chronic pancreatitis. Aortic Atherosclerosis (ICD10-I70.0). Electronically Signed   By: Melanie  Sanford M.D.   On: 08/02/2022 15:41   CT Chest W Contrast  Result Date: 08/02/2022 CLINICAL DATA:  Followup abnormal chest x-ray. EXAM: CT CHEST WITH CONTRAST TECHNIQUE: Multidetector CT imaging of the chest was performed during intravenous contrast administration. RADIATION DOSE REDUCTION: This exam was performed according to the departmental dose-optimization program which includes automated exposure control, adjustment of the mA and/or kV according to patient size and/or use of iterative reconstruction technique. CONTRAST:  75mL OMNIPAQUE IOHEXOL 300 MG/ML  SOLN COMPARISON:  Chest x-ray, same date. FINDINGS: Cardiovascular: The heart is normal in size. No pericardial effusion. Age related atherosclerotic  calcifications involving the aorta but no aneurysm or dissection. The branch vessels are patent. Three-vessel coronary artery calcifications are noted. Mediastinum/Nodes: Extensive left hilar adenopathy. There is also a borderline enlarged prevascular node measuring 9 mm on image 82/2. Left-sided subcarinal node measures 11 mm on image 88/2. No contralateral mediastinal adenopathy. The esophagus is grossly. Lungs/Pleura: Large partially necrotic central left lung mass involving both the inferior aspect of the left upper lobe and the superior aspect of the left lower lobe. It measures approximately 7.1 x 6.0 x 4.4 cm. It surrounds the descending thoracic aorta. Findings consistent with a primary neoplasm. Recommend bronchoscopic biopsy. Interstitial changes surrounding lesion peripherally in the left lower lobe could be obstructive pneumonitis and atelectasis. No worrisome pulmonary nodules to suggest pulmonary metastatic disease. No pleural effusions or pleural nodules. Upper Abdomen: No significant upper abdominal findings. No hepatic or adrenal gland lesions. No upper abdominal adenopathy. Simple 6 cm nonenhancing left renal cyst not requiring any further imaging evaluation or follow-up. Moderate age related atherosclerotic calcifications involving the abdominal aorta and branch vessels. Musculoskeletal: No chest wall mass, supraclavicular or axillary adenopathy. The thyroid gland is unremarkable. No worrisome bone lesions to suggest metastatic disease. Advanced degenerative changes involving the right shoulder. IMPRESSION: 1. 7.1 x 6.0 x 4.4 cm partially necrotic central left lung mass involving both the inferior aspect of the left upper lobe and the superior aspect of the left lower lobe. Findings consistent with a primary neoplasm. Recommend bronchoscopic biopsy. 2. Extensive left hilar adenopathy and borderline enlarged prevascular and subcarinal lymph nodes. 3. No worrisome pulmonary nodules to suggest  pulmonary metastatic disease. 4. No findings for upper abdominal metastatic disease or osseous metastatic disease. 5. Age related atherosclerotic calcifications involving the thoracic and abdominal aorta and branch vessels including the coronary arteries. 6. Aortic atherosclerosis. Aortic Atherosclerosis (ICD10-I70.0). Electronically Signed   By: P.  Gallerani M.D.   On: 08/02/2022 13:12   DG Chest 2 View  Result Date: 08/02/2022 CLINICAL DATA:  coughing up blood EXAM: CHEST - 2 VIEW COMPARISON:  CXR 07/02/21 FINDINGS: No pleural effusion. No pneumothorax. Unchanged cardiac contours. Compared to prior exam there may be a new focal airspace opacity in the left perihilar region. No radiographically apparent displaced rib fractures. Visualized upper is unremarkable. Vertebral body heights are maintained. IMPRESSION: New perihilar opacity on the left could represent infection or atelectasis. Recommend further evaluation with a CT of the chest to exclude the   possibility an endobronchial or peribronchial lesion. Electronically Signed   By: Hemant  Desai M.D.   On: 08/02/2022 10:06    

## 2022-08-11 NOTE — Assessment & Plan Note (Signed)
Discussed with patient and wife. 

## 2022-08-11 NOTE — Progress Notes (Signed)
START ON PATHWAY REGIMEN - Non-Small Cell Lung     A cycle is every 7 days, concurrent with RT:     Paclitaxel      Carboplatin   **Always confirm dose/schedule in your pharmacy ordering system**  Patient Characteristics: Preoperative or Nonsurgical Candidate (Clinical Staging), Stage III - Nonsurgical Candidate (Nonsquamous and Squamous), PS = 0, 1 Therapeutic Status: Preoperative or Nonsurgical Candidate (Clinical Staging) AJCC T Category: cT4 AJCC N Category: cN1 AJCC M Category: cM0 AJCC 8 Stage Grouping: IIIA ECOG Performance Status: 1 Intent of Therapy: Curative Intent, Discussed with Patient 

## 2022-08-11 NOTE — Assessment & Plan Note (Addendum)
Bronchoscopy biopsy results were reviewed and discussed with patient. Commend MRI brain with and without contrast as well as PET scan.  If no additional sites of metastasis is discovered on PET/MRI, patient has stage III squamous cell carcinoma.  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  I recommend concurrent chemoradiation with weekly carboplatin [AUC 2] and Taxol 45mg /m2 followed by systemic immunotherapy with durvalumab. Chemotherapy education was provided.  We had discussed the composition of chemotherapy regimen, length of chemo cycle, duration of treatment and the time to assess response to treatment.  I explained to the patient the risks and benefits of chemotherapy with carboplatin and Taxol  including all but not limited to hair loss, mouth sore, nausea, vomiting, low blood counts, bleeding, infusion reactions and risk of life threatening infection and even death, secondary malignancy etc.  Risk of neuropathy is associated with Taxol. Marland Kitchen  .Patient voices understanding and willing to proceed chemotherapy and immunotherapy.  I will also obtain NGS #  Chemotherapy education; option of Mediport placement was discussed with patient and he is interested in having Mediport placed.  Refer to vascular surgeon.Marland Kitchen Antiemetics-Zofran and Compazine; EMLA cream sent to pharmacy Refer to radiation oncology

## 2022-08-11 NOTE — Telephone Encounter (Signed)
[facility] NGS and (xT & xR with PD-L1 [id number] IHC requested) on ARS- [id number], lung biopsy, collected 08/05/22.    Financial application was submitted

## 2022-08-11 NOTE — Progress Notes (Signed)
Pt here to establish care.  Pt reports he coughs up blood in the mornings. Pt being followed by pulmonology.

## 2022-08-12 ENCOUNTER — Encounter: Payer: Self-pay | Admitting: *Deleted

## 2022-08-12 ENCOUNTER — Telehealth: Payer: Self-pay | Admitting: *Deleted

## 2022-08-12 ENCOUNTER — Other Ambulatory Visit: Payer: Self-pay | Admitting: *Deleted

## 2022-08-12 ENCOUNTER — Other Ambulatory Visit: Payer: Self-pay

## 2022-08-12 DIAGNOSIS — C3490 Malignant neoplasm of unspecified part of unspecified bronchus or lung: Secondary | ICD-10-CM

## 2022-08-12 NOTE — Telephone Encounter (Signed)
Received call from pt's spouse that they were able to pick up his prescriptions for prednisone and levaquin. Pt states that he does not feel comfortable taking levaquin due to potential side effects. Pt's wife stated that pt is currently taking augmentin which he started on Wed 6/19 by Dr. Meredeth Ide. Advised pt's wife to continue taking augmentin at this time and to start prednisone. Pt's wife verbalized understanding.

## 2022-08-12 NOTE — Progress Notes (Signed)
Met with patient during hospital follow up visit with Dr. Cathie Hoops. All questions answered during visit. Reviewed upcoming appts. Informed that will be called with appts unable to schedule at this time. Contact info given and instructed to call with any questions or needs. Pt and wife verbalized understanding.

## 2022-08-16 ENCOUNTER — Ambulatory Visit
Admission: RE | Admit: 2022-08-16 | Discharge: 2022-08-16 | Disposition: A | Discharge status: Home / Self Care | Payer: Medicare Other | Source: Ambulatory Visit | Attending: Radiation Oncology | Admitting: Radiation Oncology

## 2022-08-16 ENCOUNTER — Encounter: Payer: Self-pay | Admitting: *Deleted

## 2022-08-16 ENCOUNTER — Ambulatory Visit
Admission: RE | Admit: 2022-08-16 | Discharge: 2022-08-16 | Disposition: A | Discharge status: Home / Self Care | Payer: Medicare Other | Source: Ambulatory Visit | Attending: Oncology | Admitting: Oncology

## 2022-08-16 ENCOUNTER — Encounter: Payer: Self-pay | Admitting: Radiation Oncology

## 2022-08-16 VITALS — BP 120/60 | HR 64 | Temp 97.4°F | Resp 16

## 2022-08-16 DIAGNOSIS — I509 Heart failure, unspecified: Secondary | ICD-10-CM | POA: Diagnosis not present

## 2022-08-16 DIAGNOSIS — Z8041 Family history of malignant neoplasm of ovary: Secondary | ICD-10-CM | POA: Diagnosis not present

## 2022-08-16 DIAGNOSIS — R590 Localized enlarged lymph nodes: Secondary | ICD-10-CM | POA: Insufficient documentation

## 2022-08-16 DIAGNOSIS — F1010 Alcohol abuse, uncomplicated: Secondary | ICD-10-CM | POA: Insufficient documentation

## 2022-08-16 DIAGNOSIS — C3492 Malignant neoplasm of unspecified part of left bronchus or lung: Secondary | ICD-10-CM | POA: Diagnosis present

## 2022-08-16 DIAGNOSIS — Z7982 Long term (current) use of aspirin: Secondary | ICD-10-CM | POA: Diagnosis not present

## 2022-08-16 DIAGNOSIS — Z85820 Personal history of malignant melanoma of skin: Secondary | ICD-10-CM | POA: Insufficient documentation

## 2022-08-16 DIAGNOSIS — I4891 Unspecified atrial fibrillation: Secondary | ICD-10-CM | POA: Insufficient documentation

## 2022-08-16 DIAGNOSIS — Z79899 Other long term (current) drug therapy: Secondary | ICD-10-CM | POA: Diagnosis not present

## 2022-08-16 DIAGNOSIS — Z792 Long term (current) use of antibiotics: Secondary | ICD-10-CM | POA: Insufficient documentation

## 2022-08-16 DIAGNOSIS — E538 Deficiency of other specified B group vitamins: Secondary | ICD-10-CM | POA: Insufficient documentation

## 2022-08-16 DIAGNOSIS — Z87891 Personal history of nicotine dependence: Secondary | ICD-10-CM | POA: Insufficient documentation

## 2022-08-16 DIAGNOSIS — Z85828 Personal history of other malignant neoplasm of skin: Secondary | ICD-10-CM | POA: Diagnosis not present

## 2022-08-16 DIAGNOSIS — C3490 Malignant neoplasm of unspecified part of unspecified bronchus or lung: Secondary | ICD-10-CM | POA: Diagnosis present

## 2022-08-16 DIAGNOSIS — I10 Essential (primary) hypertension: Secondary | ICD-10-CM | POA: Diagnosis not present

## 2022-08-16 DIAGNOSIS — L570 Actinic keratosis: Secondary | ICD-10-CM | POA: Insufficient documentation

## 2022-08-16 DIAGNOSIS — G4730 Sleep apnea, unspecified: Secondary | ICD-10-CM | POA: Insufficient documentation

## 2022-08-16 MED ORDER — GADOBUTROL 1 MMOL/ML IV SOLN
7.0000 mL | Freq: Once | INTRAVENOUS | Status: AC | PRN
  Administered 2022-08-16: 7 mL via INTRAVENOUS

## 2022-08-16 NOTE — Progress Notes (Signed)
Met with patient and his wife during initial consult with Dr. Thea Alken. All questions answered during visit. Reviewed upcoming appts. Informed pt will follow up on referral to vascular surgery for port placement. Instructed to call with any questions or needs. Pt verbalized understanding.

## 2022-08-16 NOTE — Consult Note (Signed)
NEW PATIENT EVALUATION  Name: Wesley Young  MRN: [id number]  Date:   08/16/2022     DOB: May 04, 1940   This 82 y.o. 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  REFERRING PHYSICIAN: Lauro Regulus, MD  CHIEF COMPLAINT:  Chief Complaint  Patient presents with   Lung Cancer    DIAGNOSIS: The encounter diagnosis was Squamous cell carcinoma of left lung (HCC).   PREVIOUS INVESTIGATIONS:  CT scans reviewed brain MRI and PET scan pending Pathology report reviewed Clinical notes reviewed  HPI: Patient is a 82 year old male who presentedWith hemoptysis.  He was found to have a 7.1 x 6.0 partial necrotic central left lung mass involving both the inferior aspect left upper lobe and the superior aspect of the left lower lobe consistent with a primary neoplasm.  He also had extensive left hilar adenopathy and border enlarged prevascular subcarinal lymph nodes.  No findings for upper abdominal metastatic disease or osseous metastatic disease was noted.  He underwent bronchoscopy which was positive for squamous cell carcinoma.  He has an MRI of his brain scheduled as well as a PET CT scan scheduled to complete his staging workup.  He is seen today for radiation oncology opinion.  He continues to have some slight hemoptysis.  He has not been has no bone pain.  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  PLANNED TREATMENT REGIMEN: IMRT radiation therapy  PAST MEDICAL HISTORY:  has a past medical history of Abnormal PSA, Actinic keratosis (03/14/2007), Actinic keratosis (01/08/2014), Alcoholism (HCC), Anxiety, Atrial fibrillation and flutter (HCC), B12 deficiency, Basal cell carcinoma (03/14/2007), Basal cell carcinoma (03/06/2014), Basal cell carcinoma (08/30/2021), Cardiomyopathy (HCC), CHF (congestive heart failure) (HCC), Dental bridge  present, Depression, Duodenitis, Dysplastic nevus (01/08/2014), Dyspnea, Dyspnea on exertion, Dysrhythmia, Erosive esophagitis, Esophageal varices (HCC), Fibrosarcoma (HCC), Gross hematuria, High cholesterol, 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    PAST SURGICAL HISTORY:  Past Surgical History:  Procedure Laterality Date   CATARACT EXTRACTION W/PHACO Left 03/12/2019   Procedure: CATARACT EXTRACTION PHACO AND INTRAOCULAR LENS PLACEMENT (IOC) LEFT 5.27 00:38.6;  Surgeon: Galen Manila, MD;  Location: [facility];  Service: Ophthalmology;  Laterality: Left;  sleep apnea   CATARACT EXTRACTION W/PHACO Right 04/09/2019   Procedure: CATARACT EXTRACTION PHACO AND INTRAOCULAR LENS PLACEMENT (IOC) RIGHT 4.84 00:52.4;  Surgeon: Galen Manila, MD;  Location: [facility];  Service: Ophthalmology;  Laterality: Right;   colonoscopy with polypectomy  12/2016   COLONOSCOPY WITH PROPOFOL N/A 12/27/2016   Procedure: COLONOSCOPY WITH PROPOFOL;  Surgeon: Toledo, Boykin Nearing, MD;  Location: [facility];  Service: Endoscopy;  Laterality: N/A;   COLONOSCOPY WITH PROPOFOL N/A 03/23/2020   Procedure: COLONOSCOPY WITH PROPOFOL;  Surgeon: Toledo, Boykin Nearing, MD;  Location: [facility];  Service: Gastroenterology;  Laterality: N/A;   ESOPHAGOGASTRODUODENOSCOPY (EGD) WITH PROPOFOL N/A 09/21/2017   Procedure: ESOPHAGOGASTRODUODENOSCOPY (EGD) WITH PROPOFOL;  Surgeon: Christena Deem, MD;  Location: [facility];  Service: Endoscopy;  Laterality: N/A;   ESOPHAGOGASTRODUODENOSCOPY (EGD) WITH PROPOFOL N/A 03/23/2020   Procedure: ESOPHAGOGASTRODUODENOSCOPY (EGD) WITH PROPOFOL;  Surgeon: Toledo,  Boykin Nearing, MD;  Location: [facility];  Service: Gastroenterology;  Laterality: N/A;   melonoma removal     MOHS SURGERY     on top of head   prostat biopsy x2     right deltoid resection in 1977     s/p resection of his right deltoid muscle in 1977     TONSILLECTOMY     TONSILLECTOMY AND ADENOIDECTOMY     VIDEO BRONCHOSCOPY WITH ENDOBRONCHIAL ULTRASOUND N/A 08/05/2022   Procedure: VIDEO BRONCHOSCOPY WITH ENDOBRONCHIAL ULTRASOUND;  Surgeon: Vida Rigger, MD;  Location: [facility];  Service: Thoracic;  Laterality: N/A;    FAMILY HISTORY: family history includes Emphysema in his sister; Heart attack in his father; Obesity in his paternal uncle; Ovarian cancer in his mother.  SOCIAL HISTORY:  reports that he quit smoking about 47 years ago. His smoking use included pipe and cigarettes. He has never used smokeless tobacco. He reports that he does not currently use alcohol. He reports that he does not currently use drugs.  ALLERGIES: Ativan [lorazepam], Benadryl [diphenhydramine], Crestor [rosuvastatin calcium], Nsaids, Rapaflo [silodosin], Statins, Zetia [ezetimibe], Latex, and Neosporin [neomycin-bacitracin zn-polymyx]  MEDICATIONS:  Current Outpatient Medications  Medication Sig Dispense Refill   acetaminophen (TYLENOL) 325 MG tablet Take 650 mg by mouth every 6 (six) hours as needed.     amoxicillin-clavulanate (AUGMENTIN) 500-125 MG tablet Take 500 mg by mouth in the morning and at bedtime.     Apoaequorin (PREVAGEN PO) Take by mouth daily.     Ascorbic Acid (VITAMIN C PO) Take 500 mg by mouth daily.     benzonatate (TESSALON) 200 MG capsule Take 1 capsule (200 mg total) by mouth 3 (three) times daily as needed for cough. 20 capsule 0   Calcium Citrate-Vitamin D (CITRACAL + D PO) Take by mouth 2 (two) times daily.     cyanocobalamin (VITAMIN B12) 1000 MCG/ML injection Inject 1,000 mcg into the muscle every 30 (thirty) days.     donepezil (ARICEPT) 5 MG tablet Take 5 mg by mouth in  the morning.     ferrous sulfate 324 MG TBEC Take 325 mg by mouth daily with breakfast.     finasteride (PROSCAR) 5 MG tablet Take 5 mg daily by mouth.     KRILL OIL PO Take by mouth daily.     losartan (COZAAR) 50 MG tablet Take 50 mg by mouth daily.     polyethylene glycol (MIRALAX / GLYCOLAX) 17 g packet Take 17 g by mouth daily as needed.     pramipexole (MIRAPEX) 0.5 MG tablet Take 0.5 mg by mouth at bedtime.     predniSONE (STERAPRED UNI-PAK 21 TAB) 10 MG (21) TBPK tablet Day 1 take 6 tablets, Day 2 take 5 tablets, Day 3 take 4 tablets, Day 4 take 3 tablets, Day 5 take 2 tablets D6 take 1 tablet 1 each 0   Probiotic Product (ALIGN PO) Take daily by mouth.     prochlorperazine (COMPAZINE) 10 MG tablet Take 1 tablet (10 mg total) by mouth every 6 (six) hours as needed for nausea or vomiting. 30 tablet 1   tamsulosin (FLOMAX) 0.4 MG CAPS capsule Take 0.4 mg by mouth daily.     Thiamine HCl (VITAMIN B1 PO) Take 1 tablet by mouth daily.     traMADol (ULTRAM) 50 MG tablet Take 50 mg every 6 (six) hours as needed by mouth.     aspirin EC 81 MG tablet Take 81 mg by mouth daily. Swallow whole. (Patient not taking: Reported on 08/16/2022)     lidocaine-prilocaine (EMLA) cream  Apply to affected area once (Patient not taking: Reported on 08/16/2022) 30 g 3   Misc Natural Products (TART CHERRY ADVANCED PO) Take by mouth daily. (Patient not taking: Reported on 08/11/2022)     ondansetron (ZOFRAN) 8 MG tablet Take 1 tablet (8 mg total) by mouth every 8 (eight) hours as needed for nausea or vomiting. Start on the third day after chemotherapy. (Patient not taking: Reported on 08/16/2022) 30 tablet 1   No current facility-administered medications for this encounter.    ECOG PERFORMANCE STATUS:  1 - Symptomatic but completely ambulatory  REVIEW OF SYSTEMS: Patient denies any weight loss, fatigue, weakness, fever, chills or night sweats. Patient denies any loss of vision, blurred vision. Patient denies any  ringing  of the ears or hearing loss. No irregular heartbeat. Patient denies heart murmur or history of fainting. Patient denies any chest pain or pain radiating to her upper extremities. Patient denies any shortness of breath, difficulty breathing at night, cough or hemoptysis. Patient denies any swelling in the lower legs. Patient denies any nausea vomiting, vomiting of blood, or coffee ground material in the vomitus. Patient denies any stomach pain. Patient states has had normal bowel movements no significant constipation or diarrhea. Patient denies any dysuria, hematuria or significant nocturia. Patient denies any problems walking, swelling in the joints or loss of balance. Patient denies any skin changes, loss of hair or loss of weight. Patient denies any excessive worrying or anxiety or significant depression. Patient denies any problems with insomnia. Patient denies excessive thirst, polyuria, polydipsia. Patient denies any swollen glands, patient denies easy bruising or easy bleeding. Patient denies any recent infections, allergies or URI. Patient "s visual fields have not changed significantly in recent time.   PHYSICAL EXAM: BP 120/60   Pulse 64   Temp (!) 97.4 F (36.3 C) (Tympanic)   Resp 16  Wheelchair-bound male in NAD.  He has sequela of prior surgery and radiation to his right upper extremity.  Well-developed well-nourished patient in NAD. HEENT reveals PERLA, EOMI, discs not visualized.  Oral cavity is clear. No oral mucosal lesions are identified. Neck is clear without evidence of cervical or supraclavicular adenopathy. Lungs are clear to A&P. Cardiac examination is essentially unremarkable with regular rate and rhythm without murmur rub or thrill. Abdomen is benign with no organomegaly or masses noted. Motor sensory and DTR levels are equal and symmetric in the upper and lower extremities. Cranial nerves II through XII are grossly intact. Proprioception is intact. No peripheral adenopathy  or edema is identified. No motor or sensory levels are noted. Crude visual fields are within normal range.  LABORATORY DATA: Pathology cytology reports reviewed    RADIOLOGY RESULTS: CT scan reviewed PET CT scan MRI brain with pending   IMPRESSION: Probable stage IIIa squamous cell carcinoma of the left lung in 82 year old male  PLAN: At this time I will wait for MRI of brain as well as PET/CT for complete staging workup.  At this time I am proceeding with simulation first thing next week after I have the findings from his PET/CT.  Should this be stage III disease would plan on 83 Gray over 7 weeks with concurrent chemotherapy under the direction of medical oncology.  Patient comprehends my recommendations well.  I personally 7 ordered CT simulation.  There will be extra effort by both professional staff as well as technical staff to coordinate and manage concurrent chemoradiation and ensuing side effects during his treatments.   I would like to take  this opportunity to thank you for allowing me to participate in the care of your patient.Noreene Filbert, MD

## 2022-08-17 ENCOUNTER — Ambulatory Visit
Admission: RE | Admit: 2022-08-17 | Discharge: 2022-08-17 | Disposition: A | Discharge status: Home / Self Care | Payer: Medicare Other | Source: Ambulatory Visit | Attending: Oncology | Admitting: Oncology

## 2022-08-17 ENCOUNTER — Telehealth (INDEPENDENT_AMBULATORY_CARE_PROVIDER_SITE_OTHER): Payer: Self-pay

## 2022-08-17 DIAGNOSIS — C3412 Malignant neoplasm of upper lobe, left bronchus or lung: Secondary | ICD-10-CM | POA: Insufficient documentation

## 2022-08-17 DIAGNOSIS — I2510 Atherosclerotic heart disease of native coronary artery without angina pectoris: Secondary | ICD-10-CM | POA: Insufficient documentation

## 2022-08-17 DIAGNOSIS — I700 Atherosclerosis of aorta: Secondary | ICD-10-CM | POA: Diagnosis not present

## 2022-08-17 DIAGNOSIS — C3490 Malignant neoplasm of unspecified part of unspecified bronchus or lung: Secondary | ICD-10-CM | POA: Diagnosis present

## 2022-08-17 LAB — GLUCOSE, CAPILLARY: Glucose-Capillary: 95 mg/dL (ref 70–99)

## 2022-08-17 MED ORDER — FLUDEOXYGLUCOSE F - 18 (FDG) INJECTION
9.0000 | Freq: Once | INTRAVENOUS | Status: AC | PRN
  Administered 2022-08-17: 9.9 via INTRAVENOUS

## 2022-08-17 NOTE — Telephone Encounter (Signed)
Spoke with the patient's spouse and he is scheduled with Dr. Wyn Quaker on 08/18/22 with a 11:00 am arrival time to the [facility]. Pre-procedure instructions were discussed and will be sent to Mychart.

## 2022-08-18 ENCOUNTER — Encounter
Admission: RE | Disposition: A | Discharge status: Home / Self Care | Payer: Self-pay | Source: Home / Self Care | Attending: Vascular Surgery

## 2022-08-18 ENCOUNTER — Encounter: Payer: Self-pay | Admitting: Vascular Surgery

## 2022-08-18 ENCOUNTER — Ambulatory Visit
Admission: RE | Admit: 2022-08-18 | Discharge: 2022-08-18 | Disposition: A | Discharge status: Home / Self Care | Payer: Medicare Other | Attending: Vascular Surgery | Admitting: Vascular Surgery

## 2022-08-18 ENCOUNTER — Inpatient Hospital Stay: Payer: Medicare Other

## 2022-08-18 ENCOUNTER — Other Ambulatory Visit: Payer: Self-pay

## 2022-08-18 DIAGNOSIS — C3490 Malignant neoplasm of unspecified part of unspecified bronchus or lung: Secondary | ICD-10-CM | POA: Diagnosis not present

## 2022-08-18 DIAGNOSIS — Z7982 Long term (current) use of aspirin: Secondary | ICD-10-CM | POA: Insufficient documentation

## 2022-08-18 DIAGNOSIS — Z87891 Personal history of nicotine dependence: Secondary | ICD-10-CM | POA: Insufficient documentation

## 2022-08-18 HISTORY — PX: PORTA CATH INSERTION: CATH118285

## 2022-08-18 SURGERY — PORTA CATH INSERTION
Anesthesia: Moderate Sedation

## 2022-08-18 MED ORDER — SODIUM CHLORIDE 0.9 % IV SOLN: INTRAVENOUS | Status: DC

## 2022-08-18 MED ORDER — SODIUM CHLORIDE 0.9 % IV SOLN
80.0000 mg | Freq: Once | INTRAVENOUS | Status: DC
  Filled 2022-08-18: qty 2

## 2022-08-18 MED ORDER — DIPHENHYDRAMINE HCL 50 MG/ML IJ SOLN: 50.0000 mg | Freq: Once | INTRAMUSCULAR | Status: DC | PRN

## 2022-08-18 MED ORDER — ONDANSETRON HCL 4 MG/2ML IJ SOLN: 4.0000 mg | Freq: Four times a day (QID) | INTRAMUSCULAR | Status: DC | PRN

## 2022-08-18 MED ORDER — HEPARIN SODIUM (PORCINE) 1000 UNIT/ML IJ SOLN
INTRAMUSCULAR | Status: AC
  Filled 2022-08-18: qty 10

## 2022-08-18 MED ORDER — CEFAZOLIN SODIUM-DEXTROSE 2-4 GM/100ML-% IV SOLN
2.0000 g | INTRAVENOUS | Status: AC
  Administered 2022-08-18: 2 g via INTRAVENOUS

## 2022-08-18 MED ORDER — MIDAZOLAM HCL 2 MG/ML PO SYRP: 8.0000 mg | ORAL_SOLUTION | Freq: Once | ORAL | Status: DC | PRN

## 2022-08-18 MED ORDER — MIDAZOLAM HCL 2 MG/2ML IJ SOLN
INTRAMUSCULAR | Status: DC | PRN
  Administered 2022-08-18: 1 mg via INTRAVENOUS

## 2022-08-18 MED ORDER — MIDAZOLAM HCL 2 MG/2ML IJ SOLN
INTRAMUSCULAR | Status: AC
  Filled 2022-08-18: qty 2

## 2022-08-18 MED ORDER — FENTANYL CITRATE (PF) 100 MCG/2ML IJ SOLN
INTRAMUSCULAR | Status: DC | PRN
  Administered 2022-08-18: 50 ug via INTRAVENOUS

## 2022-08-18 MED ORDER — LIDOCAINE-EPINEPHRINE (PF) 1 %-1:200000 IJ SOLN
INTRAMUSCULAR | Status: DC | PRN
  Administered 2022-08-18: 10 mL

## 2022-08-18 MED ORDER — METHYLPREDNISOLONE SODIUM SUCC 125 MG IJ SOLR: 125.0000 mg | Freq: Once | INTRAMUSCULAR | Status: DC | PRN

## 2022-08-18 MED ORDER — FAMOTIDINE 20 MG PO TABS: 40.0000 mg | ORAL_TABLET | Freq: Once | ORAL | Status: DC | PRN

## 2022-08-18 MED ORDER — HEPARIN (PORCINE) IN NACL 1000-0.9 UT/500ML-% IV SOLN
INTRAVENOUS | Status: DC | PRN
  Administered 2022-08-18: 500 mL

## 2022-08-18 MED ORDER — CEFAZOLIN SODIUM-DEXTROSE 2-4 GM/100ML-% IV SOLN
INTRAVENOUS | Status: AC
  Filled 2022-08-18: qty 100

## 2022-08-18 MED ORDER — HYDROMORPHONE HCL 1 MG/ML IJ SOLN: 1.0000 mg | Freq: Once | INTRAMUSCULAR | Status: DC | PRN

## 2022-08-18 MED ORDER — FENTANYL CITRATE (PF) 100 MCG/2ML IJ SOLN
INTRAMUSCULAR | Status: AC
  Filled 2022-08-18: qty 2

## 2022-08-18 SURGICAL SUPPLY — 11 items
ADH SKN CLS APL DERMABOND .7 (GAUZE/BANDAGES/DRESSINGS) ×1
COVER PROBE ULTRASOUND 5X96 (MISCELLANEOUS) IMPLANT
DERMABOND ADVANCED .7 DNX12 (GAUZE/BANDAGES/DRESSINGS) IMPLANT
HANDLE YANKAUER SUCT BULB TIP (MISCELLANEOUS) IMPLANT
KIT PORT POWER 8FR ISP CVUE (Port) IMPLANT
PACK ANGIOGRAPHY (CUSTOM PROCEDURE TRAY) ×1 IMPLANT
PENCIL ELECTRO HAND CTR (MISCELLANEOUS) IMPLANT
SUT MNCRL AB 4-0 PS2 18 (SUTURE) IMPLANT
SUT VIC AB 3-0 SH 27 (SUTURE) ×1
SUT VIC AB 3-0 SH 27X BRD (SUTURE) IMPLANT
TUBING CONNECTING 10 (TUBING) IMPLANT

## 2022-08-18 NOTE — Telephone Encounter (Signed)
Patient's spouse called back and left a message, stating that she did not get the information regarding his procedure yesterday because she was in the MM and people were loud and walking by her. Patient also stated someone called at about 5:00 yesterday and told her someone from the office would call to go over the information as well. I returned the patient's spouse call and again discussed the pre-procedure instructions. The pre-procedure instructions were also sent to the Mychart yesterday as well and she was informed yesterday.

## 2022-08-18 NOTE — Interval H&P Note (Signed)
History and Physical Interval Note:  08/18/2022 11:16 AM  Wesley Young  has presented today for surgery, with the diagnosis of Porta Cath Placement   Lung Ca.  The various methods of treatment have been discussed with the patient and family. After consideration of risks, benefits and other options for treatment, the patient has consented to  Procedure(s): PORTA CATH INSERTION (N/A) as a surgical intervention.  The patient's history has been reviewed, patient examined, no change in status, stable for surgery.  I have reviewed the patient's chart and labs.  Questions were answered to the patient's satisfaction.     [name]

## 2022-08-18 NOTE — Op Note (Signed)
      [facility]       Operative Note  Date: 08/18/2022  Preoperative diagnosis:  1. Lung cancer  Postoperative diagnosis:  Same as above  Procedures: #1. Ultrasound guidance for vascular access to the right internal jugular vein. #2. Fluoroscopic guidance for placement of catheter. #3. Placement of CT compatible Port-A-Cath, right internal jugular vein.  Surgeon: [name], MD.   Anesthesia: Local with moderate conscious sedation for approximately 23  minutes using 1 mg of Versed and 50 mcg of Fentanyl  Fluoroscopy time: less than 1 minute  Contrast used: 0  Estimated blood loss: 5 cc  Indication for the procedure:  The patient is a 82 y.o.male with lung cancer.  The patient needs a Port-A-Cath for durable venous access, chemotherapy, lab draws, and CT scans. We are asked to place this. Risks and benefits were discussed and informed consent was obtained.  Description of procedure: The patient was brought to the vascular and interventional radiology suite.  Moderate conscious sedation was administered throughout the procedure during a face to face encounter with the patient with my supervision of the RN administering medicines and monitoring the patient's vital signs, pulse oximetry, telemetry and mental status throughout from the start of the procedure until the patient was taken to the recovery room. The right neck chest and shoulder were sterilely prepped and draped, and a sterile surgical field was created. Ultrasound was used to help visualize a patent right internal jugular vein. This was then accessed under direct ultrasound guidance without difficulty with the Seldinger needle and a permanent image was recorded. A J-wire was placed. After skin nick and dilatation, the peel-away sheath was then placed over the wire. I then anesthetized an area under the clavicle approximately 1-2 fingerbreadths. A transverse incision was created and an inferior pocket was  created with electrocautery and blunt dissection. The port was then brought onto the field, placed into the pocket and secured to the chest wall with 2 Prolene sutures. The catheter was connected to the port and tunneled from the subclavicular incision to the access site. Fluoroscopic guidance was then used to cut the catheter to an appropriate length. The catheter was then placed through the peel-away sheath and the peel-away sheath was removed. The catheter tip was parked in excellent location under fluorocoscopic guidance in the SVC just above the right atrium. The pocket was then irrigated with antibiotic impregnated saline and the wound was closed with a running 3-0 Vicryl and a 4-0 Monocryl. The access incision was closed with a single 4-0 Monocryl. The Huber needle was used to withdraw blood and flush the port with heparinized saline. Dermabond was then placed as a dressing. The patient tolerated the procedure well and was taken to the recovery room in stable condition.   [name] 08/18/2022 2:42 PM   This note was created with Dragon Medical transcription system. Any errors in dictation are purely unintentional.

## 2022-08-19 ENCOUNTER — Encounter: Payer: Self-pay | Admitting: Vascular Surgery

## 2022-08-20 ENCOUNTER — Other Ambulatory Visit: Payer: Self-pay

## 2022-08-22 ENCOUNTER — Encounter: Payer: Self-pay | Admitting: *Deleted

## 2022-08-22 ENCOUNTER — Telehealth: Payer: Self-pay

## 2022-08-22 ENCOUNTER — Ambulatory Visit
Admission: RE | Admit: 2022-08-22 | Discharge: 2022-08-22 | Disposition: A | Discharge status: Home / Self Care | Payer: Medicare Other | Source: Ambulatory Visit | Attending: Radiation Oncology | Admitting: Radiation Oncology

## 2022-08-22 DIAGNOSIS — C3492 Malignant neoplasm of unspecified part of left bronchus or lung: Secondary | ICD-10-CM | POA: Insufficient documentation

## 2022-08-22 DIAGNOSIS — R042 Hemoptysis: Secondary | ICD-10-CM | POA: Diagnosis not present

## 2022-08-22 DIAGNOSIS — C3432 Malignant neoplasm of lower lobe, left bronchus or lung: Secondary | ICD-10-CM | POA: Insufficient documentation

## 2022-08-22 DIAGNOSIS — I10 Essential (primary) hypertension: Secondary | ICD-10-CM | POA: Insufficient documentation

## 2022-08-22 DIAGNOSIS — Z510 Encounter for antineoplastic radiation therapy: Secondary | ICD-10-CM | POA: Insufficient documentation

## 2022-08-22 DIAGNOSIS — Z85820 Personal history of malignant melanoma of skin: Secondary | ICD-10-CM | POA: Insufficient documentation

## 2022-08-22 DIAGNOSIS — R21 Rash and other nonspecific skin eruption: Secondary | ICD-10-CM | POA: Diagnosis not present

## 2022-08-22 DIAGNOSIS — G629 Polyneuropathy, unspecified: Secondary | ICD-10-CM | POA: Diagnosis not present

## 2022-08-22 DIAGNOSIS — C7931 Secondary malignant neoplasm of brain: Secondary | ICD-10-CM | POA: Diagnosis not present

## 2022-08-22 DIAGNOSIS — Z7982 Long term (current) use of aspirin: Secondary | ICD-10-CM | POA: Insufficient documentation

## 2022-08-22 DIAGNOSIS — L570 Actinic keratosis: Secondary | ICD-10-CM | POA: Insufficient documentation

## 2022-08-22 DIAGNOSIS — Z85828 Personal history of other malignant neoplasm of skin: Secondary | ICD-10-CM | POA: Insufficient documentation

## 2022-08-22 DIAGNOSIS — Z87891 Personal history of nicotine dependence: Secondary | ICD-10-CM | POA: Insufficient documentation

## 2022-08-22 DIAGNOSIS — Z792 Long term (current) use of antibiotics: Secondary | ICD-10-CM | POA: Insufficient documentation

## 2022-08-22 DIAGNOSIS — Z79899 Other long term (current) drug therapy: Secondary | ICD-10-CM | POA: Insufficient documentation

## 2022-08-22 DIAGNOSIS — G4730 Sleep apnea, unspecified: Secondary | ICD-10-CM | POA: Insufficient documentation

## 2022-08-22 DIAGNOSIS — R590 Localized enlarged lymph nodes: Secondary | ICD-10-CM | POA: Insufficient documentation

## 2022-08-22 DIAGNOSIS — I509 Heart failure, unspecified: Secondary | ICD-10-CM | POA: Insufficient documentation

## 2022-08-22 DIAGNOSIS — E538 Deficiency of other specified B group vitamins: Secondary | ICD-10-CM | POA: Insufficient documentation

## 2022-08-22 DIAGNOSIS — I4891 Unspecified atrial fibrillation: Secondary | ICD-10-CM | POA: Insufficient documentation

## 2022-08-22 DIAGNOSIS — F1010 Alcohol abuse, uncomplicated: Secondary | ICD-10-CM | POA: Insufficient documentation

## 2022-08-22 DIAGNOSIS — Z8041 Family history of malignant neoplasm of ovary: Secondary | ICD-10-CM | POA: Insufficient documentation

## 2022-08-22 NOTE — Telephone Encounter (Signed)
-----   Message from Rickard Patience, MD sent at 08/21/2022  4:42 PM EDT ----- MRI showed brain metastatic disease, stage IV disease. I recommend systemic treatment instead of concurrent chemo with RT.  I will wait for his NGS and then decide treatment regimen, please let me now once result is back.  Meanwhile he will proceed with lung radiation for hemoptysis.  He will also need brain radiation as well.  Follow up TBD zy

## 2022-08-22 NOTE — Telephone Encounter (Signed)
Pt scheduled for first radiation tx on 7/10.

## 2022-08-22 NOTE — Telephone Encounter (Signed)
NGS results pending.

## 2022-08-22 NOTE — Progress Notes (Signed)
Met with patient and his wife after CT simulation appt today to review PET scan and brain MRI results. Results discussed in detail and informed of MD recommendations to hold off on chemo right now until NGS results have been reported. Pt advised to continue with lung radiation at this time and Dr. Cathie Hoops will add in chemotherapy regimen in a few weeks. Dr. Rushie Chestnut made aware of brain MRI results and he recommended SRS treatments in the future once lung radiation completed. All questions answered during visit. Informed that appt for chemo class tomorrow will be cancelled until treatment plan can be finalized. Informed pt and his wife that will update them once NGS has been reported and chemo regimen finalized. Pt and his wife verbalized understanding. Nothing further needed at this time.

## 2022-08-23 ENCOUNTER — Inpatient Hospital Stay: Payer: Medicare Other

## 2022-08-23 NOTE — Telephone Encounter (Signed)
Report sent to scan. Copy given to MD

## 2022-08-24 DIAGNOSIS — Z510 Encounter for antineoplastic radiation therapy: Secondary | ICD-10-CM | POA: Diagnosis not present

## 2022-08-26 ENCOUNTER — Encounter: Payer: Self-pay | Admitting: Oncology

## 2022-08-31 ENCOUNTER — Ambulatory Visit: Admission: RE | Admit: 2022-08-31 | Payer: Medicare Other | Source: Ambulatory Visit

## 2022-09-01 ENCOUNTER — Encounter: Payer: Self-pay | Admitting: *Deleted

## 2022-09-01 ENCOUNTER — Ambulatory Visit
Admission: RE | Admit: 2022-09-01 | Discharge: 2022-09-01 | Disposition: A | Discharge status: Home / Self Care | Payer: Medicare Other | Source: Ambulatory Visit | Attending: Radiation Oncology | Admitting: Radiation Oncology

## 2022-09-01 ENCOUNTER — Ambulatory Visit: Payer: Medicare Other | Admitting: Dermatology

## 2022-09-01 ENCOUNTER — Other Ambulatory Visit: Payer: Self-pay

## 2022-09-01 DIAGNOSIS — Z510 Encounter for antineoplastic radiation therapy: Secondary | ICD-10-CM | POA: Diagnosis not present

## 2022-09-01 LAB — RAD ONC ARIA SESSION SUMMARY
Course Elapsed Days: 0
Plan Fractions Treated to Date: 1
Plan Prescribed Dose Per Fraction: 2 Gy
Plan Total Fractions Prescribed: 35
Plan Total Prescribed Dose: 70 Gy
Reference Point Dosage Given to Date: 2 Gy
Reference Point Session Dosage Given: 2 Gy
Session Number: 1

## 2022-09-02 ENCOUNTER — Telehealth: Payer: Self-pay | Admitting: *Deleted

## 2022-09-02 ENCOUNTER — Telehealth: Payer: Self-pay | Admitting: Pharmacist

## 2022-09-02 ENCOUNTER — Inpatient Hospital Stay: Payer: Medicare Other | Attending: Oncology | Admitting: Oncology

## 2022-09-02 ENCOUNTER — Inpatient Hospital Stay: Payer: Medicare Other

## 2022-09-02 ENCOUNTER — Encounter: Payer: Self-pay | Admitting: Oncology

## 2022-09-02 ENCOUNTER — Other Ambulatory Visit (HOSPITAL_COMMUNITY): Payer: Self-pay

## 2022-09-02 ENCOUNTER — Other Ambulatory Visit: Payer: Self-pay

## 2022-09-02 ENCOUNTER — Telehealth: Payer: Self-pay

## 2022-09-02 ENCOUNTER — Ambulatory Visit
Admission: RE | Admit: 2022-09-02 | Discharge: 2022-09-02 | Disposition: A | Discharge status: Home / Self Care | Payer: Medicare Other | Source: Ambulatory Visit | Attending: Radiation Oncology | Admitting: Radiation Oncology

## 2022-09-02 VITALS — HR 51 | Temp 97.4°F | Resp 18 | Wt 176.0 lb

## 2022-09-02 DIAGNOSIS — Z510 Encounter for antineoplastic radiation therapy: Secondary | ICD-10-CM | POA: Diagnosis not present

## 2022-09-02 DIAGNOSIS — C7931 Secondary malignant neoplasm of brain: Secondary | ICD-10-CM

## 2022-09-02 DIAGNOSIS — C3492 Malignant neoplasm of unspecified part of left bronchus or lung: Secondary | ICD-10-CM

## 2022-09-02 DIAGNOSIS — R042 Hemoptysis: Secondary | ICD-10-CM

## 2022-09-02 DIAGNOSIS — I5020 Unspecified systolic (congestive) heart failure: Secondary | ICD-10-CM

## 2022-09-02 DIAGNOSIS — Z7189 Other specified counseling: Secondary | ICD-10-CM

## 2022-09-02 DIAGNOSIS — Z95828 Presence of other vascular implants and grafts: Secondary | ICD-10-CM

## 2022-09-02 LAB — CMP (CANCER CENTER ONLY)
ALT: 22 U/L (ref 0–44)
AST: 15 U/L (ref 15–41)
Albumin: 3.6 g/dL (ref 3.5–5.0)
Alkaline Phosphatase: 114 U/L (ref 38–126)
Anion gap: 8 (ref 5–15)
BUN: 18 mg/dL (ref 8–23)
CO2: 25 mmol/L (ref 22–32)
Calcium: 9.4 mg/dL (ref 8.9–10.3)
Chloride: 100 mmol/L (ref 98–111)
Creatinine: 0.54 mg/dL — ABNORMAL LOW (ref 0.61–1.24)
GFR, Estimated: >60 mL/min (ref >60)
Glucose, Bld: 96 mg/dL (ref 70–99)
Potassium: 4.2 mmol/L (ref 3.5–5.1)
Sodium: 133 mmol/L — ABNORMAL LOW (ref 135–145)
Total Bilirubin: 0.3 mg/dL (ref 0.3–1.2)
Total Protein: 6.6 g/dL (ref 6.5–8.1)

## 2022-09-02 LAB — RAD ONC ARIA SESSION SUMMARY
Course Elapsed Days: 1
Plan Fractions Treated to Date: 2
Plan Prescribed Dose Per Fraction: 2 Gy
Plan Total Fractions Prescribed: 35
Plan Total Prescribed Dose: 70 Gy
Reference Point Dosage Given to Date: 4 Gy
Reference Point Session Dosage Given: 2 Gy
Session Number: 2

## 2022-09-02 LAB — CBC WITH DIFFERENTIAL (CANCER CENTER ONLY)
Abs Immature Granulocytes: 0.04 10*3/uL (ref 0.00–0.07)
Basophils Absolute: 0.1 10*3/uL (ref 0.0–0.1)
Basophils Relative: 1 %
Eosinophils Absolute: 0.3 10*3/uL (ref 0.0–0.5)
Eosinophils Relative: 3 %
HCT: 34.2 % — ABNORMAL LOW (ref 39.0–52.0)
Hemoglobin: 11.1 g/dL — ABNORMAL LOW (ref 13.0–17.0)
Immature Granulocytes: 0 %
Lymphocytes Relative: 8 %
Lymphs Abs: 0.7 10*3/uL (ref 0.7–4.0)
MCH: 29.8 pg (ref 26.0–34.0)
MCHC: 32.5 g/dL (ref 30.0–36.0)
MCV: 91.9 fL (ref 80.0–100.0)
Monocytes Absolute: 0.7 10*3/uL (ref 0.1–1.0)
Monocytes Relative: 8 %
Neutro Abs: 7 10*3/uL (ref 1.7–7.7)
Neutrophils Relative %: 80 %
Platelet Count: 281 10*3/uL (ref 150–400)
RBC: 3.72 MIL/uL — ABNORMAL LOW (ref 4.22–5.81)
RDW: 13.2 % (ref 11.5–15.5)
WBC Count: 8.9 10*3/uL (ref 4.0–10.5)
nRBC: 0 % (ref 0.0–0.2)

## 2022-09-02 MED ORDER — HEPARIN SOD (PORK) LOCK FLUSH 100 UNIT/ML IV SOLN
500.0000 [IU] | Freq: Once | INTRAVENOUS | Status: AC
  Administered 2022-09-02: 500 [IU] via INTRAVENOUS
  Filled 2022-09-02: qty 5

## 2022-09-02 NOTE — Telephone Encounter (Addendum)
Oral Oncology Patient Advocate Encounter  Prior Authorization for [place] has been approved.    PA#  [id number]  Effective dates: 09/02/22 through 02/21/23  Patients co-pay is $3,086.47.   PAP initiated. See additional encounter.    Ardeen Fillers, CPhT Oncology Pharmacy Patient Advocate  [facility]  [phone] (phone) [phone] (fax) 09/02/2022 4:20 PM

## 2022-09-02 NOTE — Telephone Encounter (Signed)
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 He also noted to have a multo color blot withthis He is asking if it is due to the radiation therapy. Please return his call

## 2022-09-02 NOTE — Assessment & Plan Note (Signed)
stage  IV squamous cell carcinoma.  .  I will also obtain NGS

## 2022-09-02 NOTE — Telephone Encounter (Signed)
Oral Oncology Patient Advocate Encounter  New authorization   Received notification that prior authorization for Gilotrif is required.   PA submitted on 09/02/22  Key [id number]  Status is pending     Ardeen Fillers, CPhT Oncology Pharmacy Patient Advocate  [facility]  [phone] (phone) [phone] (fax) 09/02/2022 4:12 PM

## 2022-09-02 NOTE — Progress Notes (Unsigned)
Patient here for oncology follow-up appointment, concerns of cough since radiation

## 2022-09-02 NOTE — Telephone Encounter (Signed)
Clinical Pharmacist Practitioner Encounter   Received new prescription for Gilotrif (afatinib) for the treatment of metastatic NSCLC, EGFR mutation positive, planned duration until disease progression or unacceptable drug toxicity.  CMP from 09/02/22 assessed, no relevant lab abnormalities. Prescription dose and frequency assessed.   Current medication list in Epic reviewed, no DDIs with afatinib identified.   Evaluated chart and no patient barriers to medication adherence identified.   Oral Oncology Clinic will continue to follow for insurance authorization, copayment issues, initial counseling and start date.   Remi Haggard, PharmD, BCPS, BCOP, CPP Hematology/Oncology Clinical Pharmacist Practitioner [facility] [phone]  09/02/2022 3:27 PM

## 2022-09-03 ENCOUNTER — Encounter: Payer: Self-pay | Admitting: Oncology

## 2022-09-03 DIAGNOSIS — C7931 Secondary malignant neoplasm of brain: Secondary | ICD-10-CM | POA: Insufficient documentation

## 2022-09-03 MED ORDER — AFATINIB DIMALEATE 30 MG PO TABS
30.0000 mg | ORAL_TABLET | Freq: Every day | ORAL | Status: DC
Start: 2022-09-03 — End: 2022-09-07

## 2022-09-03 MED ORDER — LOPERAMIDE HCL 2 MG PO CAPS: 2.0000 mg | ORAL_CAPSULE | ORAL | 2 refills | Status: DC

## 2022-09-03 NOTE — Assessment & Plan Note (Addendum)
Discussed with patient and wife. Not curable condition, palliative intent.

## 2022-09-03 NOTE — Assessment & Plan Note (Signed)
Afatinib and Osimertinib both can cross blood brain barrier.  I recommend to hold off brain radiation and close monitor with MRI brain.

## 2022-09-03 NOTE — Assessment & Plan Note (Signed)
Continue follow up with RadOnc to finish radiation to lung.

## 2022-09-03 NOTE — Addendum Note (Signed)
Addended by: Rickard Patience on: 09/03/2022 02:03 PM   Modules accepted: Orders

## 2022-09-03 NOTE — Progress Notes (Signed)
DISCONTINUE ON PATHWAY REGIMEN - Non-Small Cell Lung     A cycle is every 7 days, concurrent with RT:     Paclitaxel      Carboplatin   **Always confirm dose/schedule in your pharmacy ordering system**  REASON: Other Reason PRIOR TREATMENT: [id number]: Carboplatin AUC=2 + Paclitaxel 45 mg/m2 Weekly During Radiation TREATMENT RESPONSE: Unable to Evaluate  START ON PATHWAY REGIMEN - Non-Small Cell Lung     A cycle is every 28 days:     Afatinib   **Always confirm dose/schedule in your pharmacy ordering system**  Patient Characteristics: Stage IV Metastatic, Squamous, Molecular Analysis Completed, Molecular Alteration Present and Eligible for Molecular Targeted Therapy, Initial Molecular Targeted Therapy, EGFR Mutation -  Uncommon (S768I, L861Q, and G719X) Therapeutic Status: Stage IV Metastatic Histology: Squamous Cell Molecular Analysis Results: Alteration Present and Eligible for Molecular Targeted Therapy Molecular Alteration Present: EGFR Mutation - Uncommon (S768I, L861Q, and G719X) Molecular Targeted Line of Therapy: Initial Molecular Targeted Therapy Intent of Therapy: Non-Curative / Palliative Intent, Discussed with Patient

## 2022-09-03 NOTE — Progress Notes (Signed)
Hematology/Oncology Progress note [phone] [phone]        REFERRING PROVIDER: Lauro Regulus, MD    CHIEF COMPLAINTS/PURPOSE OF CONSULTATION:  Squamous cell lung cancer   ASSESSMENT & PLAN:   Cancer Staging  Squamous cell carcinoma lung (HCC) Staging form: Lung, AJCC 8th Edition - Clinical stage from 08/11/2022: Stage IIIA (cT4, cN1, cM0) - Signed by Rickard Patience, MD on 08/11/2022   Squamous cell carcinoma lung (HCC) MRI brain result was discussed with patient. Small brain metastatic disease.  stage IV squamous cell carcinoma NGS showed EGFR L747 A750, is associated with inferior PFS compared to the common [id number] mutation in patients treated with 1L osimertinib, but with possible good sensitive to Afatinib.  Rationale and side effects were reviewed with patient. With patient's age and other medical problems, I recommend to start with Afatinib 30mg  daily. Will check with insurance approval.  Afatinib is associated with more on-target side effects than osimertinib, if he is not able to tolerate, will consider switch to Osimertinib. Patient and wife agree with the plan.   Goals of care, counseling/discussion Discussed with patient and wife. Not curable condition, palliative intent.   Hemoptysis Continue follow up with RadOnc to finish radiation to lung.    Metastasis to brain [facility]) Afatinib and Osimertinib both can cross blood brain barrier.  I recommend to hold off brain radiation and close monitor with MRI brain.    Orders Placed This Encounter  Procedures   Consent Attestation for Oncology Treatment    Order Specific Question:   The patient is informed of risks, benefits, side-effects of the prescribed oncology treatment. Potential short term and long term side effects and response rates discussed. After a long discussion, the patient made informed decision to proceed.    Answer:   Yes   CBC with Differential (Cancer Center Only)     Standing Status:   Future    Number of Occurrences:   1    Standing Expiration Date:   09/02/2023   CMP (Cancer Center only)    Standing Status:   Future    Number of Occurrences:   1    Standing Expiration Date:   09/02/2023   ECHOCARDIOGRAM COMPLETE    Standing Status:   Future    Standing Expiration Date:   09/02/2023    Order Specific Question:   Where should this test be performed    Answer:   [facility]    Order Specific Question:   Perflutren DEFINITY (image enhancing agent) should be administered unless hypersensitivity or allergy exist    Answer:   Administer Perflutren    Order Specific Question:   Is a special reader required? (athlete or structural heart)    Answer:   No    Order Specific Question:   Does this study need to be read by the Structural team/Level 3 readers?    Answer:   No    Order Specific Question:   Reason for exam-Echo    Answer:   CHF-Acute Diastolic  I50.31   Follow-up 1-2 weeks after he starts on Afatinib. To start chemotherapy when patient starts radiation. All questions were answered. The patient knows to call the clinic with any problems, questions or concerns.  Rickard Patience, MD, PhD [facility] Hematology Oncology 09/02/2022    HISTORY OF PRESENTING ILLNESS:  Wesley Young 82 y.o. 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  I have reviewed his chart and materials  related to his cancer extensively and collaborated history with the patient. Summary of oncologic history is as follows: Oncology History  Squamous cell carcinoma lung (HCC)  08/02/2022 Initial Diagnosis   Squamous cell carcinoma lung (HCC)  07/18/2022, patient developed hemoptysis nitially very small amount. Stopped for 3 days and again patient experienced hemoptysis which prompted him to go to emergency room on 08/02/2022 for further evaluation. CT findings showed 7.1 x 6.0 x 4.4 cm partially necrotic central left lung mass, extensive  left hilar adenopathy and borderline enlarged prevascular and subcarinal lymph nodes.  08/05/2022 patient underwent biopsy via bronchoscopy by Dr. Karna Christmas. Left lower lobe ENB assisted biopsy showed squamous cell carcinoma. Left lower lobe lavage-suspicious for malignancy Station 10 R lymph node FNA negative for malignancy Left lower lobe bronchoscopy with brushing-positive for malignancy-non-small cell carcinoma Left lower lobe bronchoscopy with FNA positive for malignancy-non-small cell carcinoma Station 7 lymph node FNA-negative Station 10 L lymph node FNA suspicious for malignancy     08/02/2022 Imaging   CT chest with contrast showed 1. 7.1 x 6.0 x 4.4 cm partially necrotic central left lung mass involving both the inferior aspect of the left upper lobe and the superior aspect of the left lower lobe. Findings consistent with a primary neoplasm. Recommend bronchoscopic biopsy. 2. Extensive left hilar adenopathy and borderline enlarged prevascular and subcarinal lymph nodes. 3. No worrisome pulmonary nodules to suggest pulmonary metastatic disease. 4. No findings for upper abdominal metastatic disease or osseous metastatic disease. 5. Age related atherosclerotic calcifications involving the thoracic and abdominal aorta and branch vessels including the coronary arteries. 6. Aortic atherosclerosis.  CT abdomen pelvis with contrast showed no evidence of primary malignancy or metastatic disease in the abdomen/pelvis.  Enlarged prostate.  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  Chronic pancreatitis.      08/11/2022 Cancer Staging   Staging form: Lung, AJCC 8th Edition - Clinical stage from 08/11/2022: Stage IIIA (cT4, cN1, cM0) - Signed by Rickard Patience, MD on 08/11/2022 Stage prefix: Initial diagnosis   08/18/2022 Procedure   Medi port placed by Dr. Wyn Quaker.    Patient is a former smoker.  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  Patient had a  history of mustard gas exposure of right extremity during service.  In 1977, patient received cobalt radiation and surgery and since then has remained in remission. 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  He has history of uncontrolled bowel and family history of lumbar radiculopathy.  Longstanding general severe sensorimotor peripheral neuropathy.  He walks with a walker at baseline.  INTERVAL HISTORY BRADLEIGH DELLY is a 82 y.o. male who has above history reviewed by me today presents for follow up visit for Stage IV squamous cell carcinoma.  Hemoptysis resolved. He finishes antibiotics and steroid taper.  He has started lung radiation. + cough.  Otherwise he has no new complaints.  MEDICAL HISTORY:  Past Medical History:  Diagnosis Date   Abnormal PSA    s/p post prostate biopsy in the past which was negative   Actinic keratosis 03/14/2007   L ant lat neck at base of neck - bx proven    Actinic keratosis 01/08/2014   R calf - bx proven    Alcoholism (HCC)    with prolonged hospitalization 2009 for withdrawal c/b aspiration pneumonia requiring vent  Anxiety    Atrial fibrillation and flutter (HCC)    pt denies afib.   B12 deficiency    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/2015   L upper back paraspinal - excision    Hx of squamous cell carcinoma of skin 2014   multiple sites   Hypertension    Impotence    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HCC)    Sleep apnea    Spinal stenosis    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   L forearm - excision    Squamous cell carcinoma of skin 12/16/2015   R dorsum hand    Squamous cell carcinoma of skin 10/02/2017   R dorsum hand    Varicose veins of both lower extremities     SURGICAL HISTORY: Past Surgical History:  Procedure Laterality Date   CATARACT EXTRACTION W/PHACO Left 03/12/2019   Procedure: CATARACT EXTRACTION PHACO AND INTRAOCULAR LENS PLACEMENT (IOC) LEFT 5.27 00:38.6;  Surgeon: Galen Manila, MD;  Location: [facility];  Service: Ophthalmology;  Laterality: Left;  sleep apnea   CATARACT EXTRACTION W/PHACO Right 04/09/2019   Procedure: CATARACT EXTRACTION PHACO AND INTRAOCULAR LENS PLACEMENT (IOC) RIGHT 4.84 00:52.4;  Surgeon: Galen Manila, MD;  Location: [facility];  Service: Ophthalmology;  Laterality: Right;   colonoscopy with polypectomy  12/2016   COLONOSCOPY WITH PROPOFOL N/A 12/27/2016   Procedure: COLONOSCOPY WITH PROPOFOL;  Surgeon: Toledo, Boykin Nearing, MD;  Location: [facility];  Service: Endoscopy;  Laterality: N/A;   COLONOSCOPY WITH PROPOFOL N/A 03/23/2020   Procedure: COLONOSCOPY WITH PROPOFOL;  Surgeon: Toledo, Boykin Nearing, MD;  Location: [facility];  Service: Gastroenterology;  Laterality: N/A;   ESOPHAGOGASTRODUODENOSCOPY (EGD) WITH PROPOFOL N/A 09/21/2017   Procedure: ESOPHAGOGASTRODUODENOSCOPY (EGD) WITH PROPOFOL;  Surgeon: Christena Deem, MD;  Location: [facility];  Service: Endoscopy;  Laterality: N/A;   ESOPHAGOGASTRODUODENOSCOPY  (EGD) WITH PROPOFOL N/A 03/23/2020   Procedure: ESOPHAGOGASTRODUODENOSCOPY (EGD) WITH PROPOFOL;  Surgeon: Toledo, Boykin Nearing, MD;  Location: [facility];  Service: Gastroenterology;  Laterality: N/A;   melonoma removal     MOHS SURGERY     on top of head   PORTA CATH INSERTION N/A 08/18/2022   Procedure: PORTA CATH INSERTION;  Surgeon: Annice Needy, MD;  Location: [facility];  Service: Cardiovascular;  Laterality: N/A;   prostat biopsy x2     right deltoid resection in 1977     s/p resection of his right deltoid muscle in 1977     TONSILLECTOMY     TONSILLECTOMY AND ADENOIDECTOMY     VIDEO BRONCHOSCOPY WITH ENDOBRONCHIAL ULTRASOUND N/A 08/05/2022  Procedure: VIDEO BRONCHOSCOPY WITH ENDOBRONCHIAL ULTRASOUND;  Surgeon: Vida Rigger, MD;  Location: [facility];  Service: Thoracic;  Laterality: N/A;    SOCIAL HISTORY: Social History   Socioeconomic History   Marital status: Married    Spouse name: Not on file   Number of children: Not on file   Years of education: Not on file   Highest education level: Not on file  Occupational History   Not on file  Tobacco Use   Smoking status: Former    Current packs/day: 0.00    Types: Pipe, Cigarettes    Start date: 97    Quit date: 1977    Years since quitting: 47.5   Smokeless tobacco: Never  Vaping Use   Vaping status: Never Used  Substance and Sexual Activity   Alcohol use: Not Currently    Comment: quit 03/26/2008   Drug use: Not Currently   Sexual activity: Not Currently  Other Topics Concern   Not on file  Social History Narrative   Not on file   Social Determinants of Health   Financial Resource Strain: Low Risk  (08/10/2022)   Received from [facility], [facility]   Overall Financial Resource Strain (CARDIA)    Difficulty of Paying Living Expenses: Not hard at all  Food Insecurity: No Food Insecurity (08/10/2022)   Received from [facility], [facility]   Hunger Vital Sign    Worried About Running Out of Food in the Last Year: Never true    Ran Out of Food in the Last Year: Never true  Transportation Needs: No Transportation Needs (08/10/2022)   Received from [facility], [facility]   [facility] - Transportation    In the past 12 months, has lack of transportation kept you from medical appointments or from getting medications?: No    Lack of Transportation (Non-Medical): No  Physical Activity: Not on file  Stress: Not on file  Social Connections: Not on file  Intimate Partner Violence: Not At Risk (08/02/2022)   Humiliation, Afraid, Rape, and Kick questionnaire    Fear of Current or Ex-Partner: No    Emotionally Abused: No    Physically Abused: No    Sexually Abused: No    FAMILY HISTORY: Family History  Problem Relation Age of Onset   Ovarian cancer Mother    Heart attack Father    Emphysema Sister    Obesity Paternal Uncle     ALLERGIES:  is allergic to ativan [lorazepam], benadryl [diphenhydramine], crestor [rosuvastatin calcium], nsaids, rapaflo [silodosin], statins, zetia [ezetimibe], latex, and neosporin [neomycin-bacitracin zn-polymyx].  MEDICATIONS:  Current Outpatient Medications  Medication Sig Dispense Refill   acetaminophen (TYLENOL) 325 MG tablet Take 650 mg by mouth every 6 (six) hours as needed.     Apoaequorin (PREVAGEN PO) Take by mouth daily.     benzonatate (TESSALON) 200 MG capsule Take 1 capsule (200 mg total) by mouth 3 (three) times daily as needed for cough. 20 capsule 0   Calcium Citrate-Vitamin D (CITRACAL + D PO) Take by mouth 2 (two) times daily.     cyanocobalamin (VITAMIN B12) 1000 MCG/ML injection Inject 1,000 mcg into the muscle every 30 (thirty) days.     donepezil (ARICEPT) 5 MG tablet Take 5 mg by mouth in the morning.     finasteride (PROSCAR) 5 MG tablet Take 1 tablet by mouth daily.     KRILL OIL PO Take by mouth daily.  losartan (COZAAR) 50 MG tablet Take 50 mg by mouth daily.     polyethylene glycol (MIRALAX / GLYCOLAX) 17 g packet Take 17 g by mouth daily as needed.     pramipexole (MIRAPEX) 0.5 MG tablet Take 0.5 mg by mouth at bedtime.     Probiotic Product (ALIGN PO) Take daily by mouth.     prochlorperazine (COMPAZINE) 10 MG tablet Take 1 tablet (10 mg total) by mouth every 6 (six) hours as needed for nausea or vomiting. 30 tablet 1   tamsulosin (FLOMAX) 0.4 MG CAPS capsule Take 0.4 mg by mouth daily.     traMADol (ULTRAM) 50 MG tablet Take 50 mg every 6 (six) hours as needed by mouth.     Ascorbic Acid (VITAMIN C PO) Take 500 mg by mouth daily. (Patient not taking: Reported on 08/18/2022)     aspirin EC 81 MG tablet Take 81 mg by mouth daily. Swallow whole. (Patient not taking: Reported on 08/16/2022)     ferrous sulfate 324 MG TBEC Take 325 mg by mouth daily with breakfast. (Patient not taking: Reported on 08/18/2022)     finasteride (PROSCAR) 5 MG tablet Take 5 mg daily by mouth.     lidocaine-prilocaine (EMLA) cream Apply to affected area once (Patient not taking: Reported on 09/02/2022) 30 g 3   Misc Natural Products (TART CHERRY ADVANCED PO) Take by mouth daily. (Patient not taking: Reported on 08/11/2022)     ondansetron (ZOFRAN) 8 MG tablet Take 1 tablet (8 mg total) by mouth every 8 (eight) hours as needed for nausea or vomiting. Start on the third day after chemotherapy. (Patient not taking: Reported on 08/16/2022) 30 tablet 1   predniSONE (STERAPRED UNI-PAK 21 TAB) 10 MG (21) TBPK tablet Day 1 take 6 tablets, Day 2 take 5 tablets, Day 3 take 4 tablets, Day 4 take 3 tablets, Day 5 take 2 tablets D6 take 1 tablet (Patient not taking: Reported on 08/18/2022) 1 each 0   Thiamine HCl (VITAMIN B1 PO) Take 1 tablet by mouth daily. (Patient not taking: Reported on 08/18/2022)     No current facility-administered medications for this visit.    Review of Systems  Constitutional:  Positive for fatigue. Negative  for appetite change, chills, fever and unexpected weight change.  HENT:   Negative for hearing loss and voice change.   Eyes:  Negative for eye problems and icterus.  Respiratory:  Positive for cough. Negative for chest tightness and shortness of breath.   Cardiovascular:  Negative for chest pain and leg swelling.  Gastrointestinal:  Negative for abdominal distention and abdominal pain.  Endocrine: Negative for hot flashes.  Genitourinary:  Negative for difficulty urinating, dysuria and frequency.   Musculoskeletal:  Positive for arthralgias.  Skin:  Negative for itching and rash.  Neurological:  Positive for extremity weakness (Chronic). Negative for light-headedness and numbness.  Hematological:  Negative for adenopathy. Does not bruise/bleed easily.  Psychiatric/Behavioral:  Negative for confusion.      PHYSICAL EXAMINATION: ECOG PERFORMANCE STATUS: 1 - Symptomatic but completely ambulatory  Vitals:   09/02/22 1207  Pulse: (!) 51  Resp: 18  Temp: (!) 97.4 F (36.3 C)  SpO2: 100%   Filed Weights   09/02/22 1207  Weight: 176 lb (79.8 kg)    Physical Exam Constitutional:      General: He is not in acute distress.    Appearance: He is not diaphoretic.  HENT:     Head: Normocephalic and atraumatic.  Nose: Nose normal.     Mouth/Throat:     Pharynx: No oropharyngeal exudate.  Eyes:     General: No scleral icterus.    Pupils: Pupils are equal, round, and reactive to light.  Cardiovascular:     Rate and Rhythm: Normal rate.     Heart sounds: No murmur heard. Pulmonary:     Effort: Pulmonary effort is normal. No respiratory distress.  Abdominal:     General: There is no distension.  Musculoskeletal:        General: Normal range of motion.     Cervical back: Normal range of motion and neck supple.  Skin:    General: Skin is warm and dry.  Neurological:     Mental Status: He is alert and oriented to person, place, and time. Mental status is at baseline.      Cranial Nerves: No cranial nerve deficit.     Motor: No abnormal muscle tone.  Psychiatric:        Mood and Affect: Mood and affect normal.      LABORATORY DATA:  I have reviewed the data as listed    Latest Ref Rng & Units 09/02/2022   12:56 PM 08/04/2022    4:29 AM 08/03/2022    4:35 AM  CBC  WBC 4.0 - 10.5 K/uL 8.9  7.7  9.1   Hemoglobin 13.0 - 17.0 g/dL 16.1  09.6  04.5   Hematocrit 39.0 - 52.0 % 34.2  35.2  36.9   Platelets 150 - 400 K/uL 281  270  276       Latest Ref Rng & Units 09/02/2022   12:56 PM 08/03/2022    4:35 AM 08/02/2022    9:11 AM  CMP  Glucose 70 - 99 mg/dL 96  409  811   BUN 8 - 23 mg/dL 18  19  17    Creatinine 0.61 - 1.24 mg/dL 9.14  7.82  9.56   Sodium 135 - 145 mmol/L 133  137  135   Potassium 3.5 - 5.1 mmol/L 4.2  4.0  4.6   Chloride 98 - 111 mmol/L 100  104  103   CO2 22 - 32 mmol/L 25  24  25    Calcium 8.9 - 10.3 mg/dL 9.4  9.1  9.8   Total Protein 6.5 - 8.1 g/dL 6.6   6.6   Total Bilirubin 0.3 - 1.2 mg/dL 0.3   0.5   Alkaline Phos 38 - 126 U/L 114   116   AST 15 - 41 U/L 15   17   ALT 0 - 44 U/L 22   18      RADIOGRAPHIC STUDIES: I have personally reviewed the radiological images as listed and agreed with the findings in the report. MR Brain W Wo Contrast  Result Date: 08/21/2022 CLINICAL DATA:  Non-small cell lung cancer. EXAM: MRI HEAD WITHOUT AND WITH CONTRAST TECHNIQUE: Multiplanar, multiecho pulse sequences of the brain and surrounding structures were obtained without and with intravenous contrast. CONTRAST:  7mL GADAVIST GADOBUTROL 1 MMOL/ML IV SOLN COMPARISON:  MR head without contrast 05/25/2022 FINDINGS: Brain: Moderate generalized atrophy is again noted. Mild periventricular white matter changes are stable. A remote lacunar infarct of the right lentiform nucleus is stable. T2 hyperintensities within the thalami bilaterally are stable. White matter changes extend into the brainstem, similar the prior exam. The cerebellum is within normal  limits. The internal auditory canals are within normal limits. Postcontrast images demonstrate a ring-enhancing 6.5 mm metastasis  along the medial left frontal lobe on image 134 of series 18. The 4 mm peripherally enhancing metastasis is present in the posterior right occipital lobe on image 76 of series 18. T2 hyperintensities surround both lesions without significant mass effect. Vascular: Flow is present in the major intracranial arteries. Skull and upper cervical spine: The craniocervical junction is normal. Upper cervical spine is within normal limits. Marrow signal is unremarkable. Sinuses/Orbits: A mild left mastoid effusion is noted. No obstructing nasopharyngeal lesion is present. The paranasal sinuses and mastoid air cells are otherwise clear. Bilateral lens replacements are noted. Globes and orbits are otherwise unremarkable. IMPRESSION: 1. 6.5 mm ring-enhancing metastasis along the medial left frontal lobe. 2. 4 mm peripherally enhancing metastasis in the posterior right occipital lobe. 3. Stable moderate generalized atrophy and white matter disease likely reflects the sequela of chronic microvascular ischemia. 4. Mild left mastoid effusion. No obstructing nasopharyngeal lesion is present. Electronically Signed   By: Marin Roberts M.D.   On: 08/21/2022 16:03   NM PET Image Initial (PI) Skull Base To Thigh  Result Date: 08/21/2022 CLINICAL DATA:  Initial treatment strategy for lung cancer . EXAM: NUCLEAR MEDICINE PET SKULL BASE TO THIGH TECHNIQUE: 9.9 mCi F-18 FDG was injected intravenously. Full-ring PET imaging was performed from the skull base to thigh after the radiotracer. CT data was obtained and used for attenuation correction and anatomic localization. Fasting blood glucose: 95 mg/dl COMPARISON:  CT chest 1/61/09 FINDINGS: Mediastinal blood pool activity: SUV max 2.27 Liver activity: SUV max NA NECK: No hypermetabolic lymph nodes in the neck. Incidental CT findings: None. CHEST: Central  left lung subpleural mass within the perihilar and paravertebral left lung, which crosses the oblique fissure. This has a maximum diameter 7.06 cm within SUV max of 20.51, image 60/4. The mass surrounds the proximal left lower lobe bronchus, image 66/4. Mild postobstructive pneumonitis identified within the posterior and medial superior segment of the left lower lobe. There are no additional tracer avid pulmonary nodules or masses identified. No tracer avid mediastinal, right hilar, axillary or supraclavicular lymph nodes. Incidental CT findings: Aortic atherosclerosis. Coronary artery calcifications. Cardiac enlargement. ABDOMEN/PELVIS: No abnormal hypermetabolic activity within the liver, pancreas, adrenal glands, or spleen. No hypermetabolic lymph nodes in the abdomen or pelvis. 1.3 cm right inguinal lymph node is identified with SUV max of 2.30, image 160/4. Incidental CT findings: Calcifications identified within the pancreas suggestive of chronic pancreatitis. Aortic atherosclerosis. Large cyst off the lateral cortex of the left kidney measures 6.5 x 5.8 cm, image 101/4. No follow-up imaging recommended. SKELETON: No focal hypermetabolic activity to suggest skeletal metastasis. Incidental CT findings: None. IMPRESSION: 1. Large left lung mass is identified which crosses the oblique fissure and surrounds the proximal left lower lobe bronchus. This is consistent with primary bronchogenic carcinoma. Change identified within the superior segment of the left lower lobe. No signs of mild postobstructive 2. No signs nodal or distant metastatic disease within the abdomen, pelvis or skeletal structures. 3. Coronary artery calcifications. 4. Signs of chronic pancreatitis. 5. Aortic Atherosclerosis (ICD10-I70.0). Electronically Signed   By: Signa Kell M.D.   On: 08/21/2022 16:01   PERIPHERAL VASCULAR CATHETERIZATION  Result Date: 08/18/2022 See surgical note for result.  DG Chest Port 1 View  Result Date:  08/05/2022 CLINICAL DATA:  Status post bronchoscopy. EXAM: PORTABLE CHEST 1 VIEW COMPARISON:  X-ray 08/02/2022.  CT 08/04/2022 FINDINGS: Focal mass lesion again seen suprahilar on the left. Apical pleural thickening. No pneumothorax, edema or effusion. Minimal opacity  in the left lung base. Normal cardiopericardial silhouette. Degenerative changes of the spine. Overlapping cardiac leads. IMPRESSION: No pneumothorax identified post bronchoscopy. Electronically Signed   By: Karen Kays M.D.   On: 08/05/2022 10:42   CT SUPER D CHEST WO MONARCH PILOT  Result Date: 08/05/2022 CLINICAL DATA:  82 year old male with history of non massive hemoptysis with lung mass. EXAM: CT CHEST WITHOUT CONTRAST TECHNIQUE: Multidetector CT imaging of the chest was performed using thin slice collimation for electromagnetic bronchoscopy planning purposes, without intravenous contrast. RADIATION DOSE REDUCTION: This exam was performed according to the departmental dose-optimization program which includes automated exposure control, adjustment of the mA and/or kV according to patient size and/or use of iterative reconstruction technique. COMPARISON:  Chest CT 08/02/2022. FINDINGS: Cardiovascular: Heart size is normal. There is no significant pericardial fluid, thickening or pericardial calcification. There is aortic atherosclerosis, as well as atherosclerosis of the great vessels of the mediastinum and the coronary arteries, including calcified atherosclerotic plaque in the left main, left anterior descending, left circumflex and right coronary arteries. Calcifications of the aortic valve. Mediastinum/Nodes: Prominent soft tissue in the left hilar region difficult to discriminate from adjacent vasculature on today's noncontrast CT examination, the corresponding to lymphadenopathy on recent contrast-enhanced study. No other mediastinal or right hilar lymphadenopathy noted. Esophagus is unremarkable in appearance. No axillary  lymphadenopathy. Lungs/Pleura: Again noted is a large irregular shaped mass in the medial aspect of the posterior left hemithorax (axial image 77 of series 2 and sagittal image 120 of series 7) estimated to measure approximately 6.0 x 5.9 x 7.8 cm on today's examination. This mass crosses the superior aspect of the left major fissure with involvement of both the superior segment of the left lower lobe and posteromedial aspect of the left upper lobe. Surrounding areas of ground-glass attenuation and septal thickening are noted in the adjacent lung parenchyma, most severe in the left lower lobe, most likely to reflect postobstructive inflammation, although some degree of lymphangitic spread of tumor is not excluded. Thickening of the superior aspect of the left major fissure is noted. Scattered areas of mild cylindrical bronchiectasis and peripheral bronchiolectasis, along with clustered micro nodularity are noted, most evident in the medial aspect of the right upper lobe, and in the right middle lobe, most compatible with areas of chronic post infectious or inflammatory scarring. No pleural effusions. Upper Abdomen: Partially imaged exophytic low-attenuation lesion in the lateral aspect of the upper pole of the left kidney, incompletely characterized on today's noncontrast CT examination, but statistically likely a cyst (no imaging follow-up recommended) measuring at least 6.7 cm. Aortic atherosclerosis. Calcifications in the head and body of the pancreas, likely sequela of chronic pancreatitis. Musculoskeletal: There are no aggressive appearing lytic or blastic lesions noted in the visualized portions of the skeleton. IMPRESSION: 1. 6.0 x 5.9 x 7.8 cm aggressive appearing mass in the left lung involving portions of both the left upper and lower lobes, highly concerning for primary bronchogenic neoplasm. Previously noted left hilar lymphadenopathy is not well demonstrated on today's noncontrast CT examination,  although left hilar fullness likely reflects malignant nodal involvement. 2. Aortic atherosclerosis, in addition to left main and three-vessel coronary artery disease. 3. There are calcifications of the aortic valve. Echocardiographic correlation for evaluation of potential valvular dysfunction may be warranted if clinically indicated. Aortic Atherosclerosis (ICD10-I70.0). Electronically Signed   By: Trudie Reed M.D.   On: 08/05/2022 08:47

## 2022-09-04 ENCOUNTER — Other Ambulatory Visit: Payer: Self-pay

## 2022-09-05 ENCOUNTER — Other Ambulatory Visit (HOSPITAL_COMMUNITY): Payer: Self-pay

## 2022-09-05 ENCOUNTER — Encounter: Payer: Self-pay | Admitting: Oncology

## 2022-09-05 ENCOUNTER — Telehealth: Payer: Self-pay

## 2022-09-05 ENCOUNTER — Ambulatory Visit
Admission: RE | Admit: 2022-09-05 | Discharge: 2022-09-05 | Disposition: A | Discharge status: Home / Self Care | Payer: Medicare Other | Source: Ambulatory Visit | Attending: Radiation Oncology | Admitting: Radiation Oncology

## 2022-09-05 ENCOUNTER — Other Ambulatory Visit: Payer: Self-pay

## 2022-09-05 DIAGNOSIS — Z510 Encounter for antineoplastic radiation therapy: Secondary | ICD-10-CM | POA: Diagnosis not present

## 2022-09-05 LAB — RAD ONC ARIA SESSION SUMMARY
Course Elapsed Days: 4
Plan Fractions Treated to Date: 3
Plan Prescribed Dose Per Fraction: 2 Gy
Plan Total Fractions Prescribed: 35
Plan Total Prescribed Dose: 70 Gy
Reference Point Dosage Given to Date: 6 Gy
Reference Point Session Dosage Given: 2 Gy
Session Number: 3

## 2022-09-05 NOTE — Telephone Encounter (Signed)
Oral Oncology Patient Advocate Encounter   Began application for assistance for Gilotrif through [employer] Patient Assistance Program.   Application will be submitted upon completion of necessary supporting documentation.   [employer]' phone number [phone].   I will continue to check the status until final determination.    Ardeen Fillers, CPhT Oncology Pharmacy Patient Advocate  [facility]  [phone] (phone) [phone] (fax) 09/05/2022 8:41 AM

## 2022-09-06 ENCOUNTER — Other Ambulatory Visit: Payer: Self-pay

## 2022-09-06 ENCOUNTER — Other Ambulatory Visit: Payer: Self-pay | Admitting: *Deleted

## 2022-09-06 ENCOUNTER — Ambulatory Visit
Admission: RE | Admit: 2022-09-06 | Discharge: 2022-09-06 | Disposition: A | Discharge status: Home / Self Care | Payer: Medicare Other | Source: Ambulatory Visit | Attending: Radiation Oncology | Admitting: Radiation Oncology

## 2022-09-06 DIAGNOSIS — C3492 Malignant neoplasm of unspecified part of left bronchus or lung: Secondary | ICD-10-CM

## 2022-09-06 DIAGNOSIS — Z510 Encounter for antineoplastic radiation therapy: Secondary | ICD-10-CM | POA: Diagnosis not present

## 2022-09-06 LAB — RAD ONC ARIA SESSION SUMMARY
Course Elapsed Days: 5
Plan Fractions Treated to Date: 4
Plan Prescribed Dose Per Fraction: 2 Gy
Plan Total Fractions Prescribed: 35
Plan Total Prescribed Dose: 70 Gy
Reference Point Dosage Given to Date: 8 Gy
Reference Point Session Dosage Given: 2 Gy
Session Number: 4

## 2022-09-06 NOTE — Telephone Encounter (Signed)
Oral Oncology Patient Advocate Encounter   Submitted application for assistance for Gilotrif to [facility] Patient Assistance Program.   Application submitted via e-fax to [phone]   Sheldon Silvan' phone number [phone].   I will continue to check the status until final determination.    Ardeen Fillers, CPhT Oncology Pharmacy Patient Advocate  [facility]  [phone] (phone) [phone] (fax) 09/06/2022 2:44 PM

## 2022-09-06 NOTE — Telephone Encounter (Signed)
Called and spoke with patient. Patient will meet me in office today, 09/06/22, around 1:30pm to sign application and drop off Proof of Income. I will submit once in hand. I will continue to follow and update until final determination.    Ardeen Fillers, CPhT Oncology Pharmacy Patient Advocate  [facility]  [phone] (phone) [phone] (fax) 09/06/2022 8:15 AM

## 2022-09-07 ENCOUNTER — Telehealth: Payer: Self-pay

## 2022-09-07 ENCOUNTER — Other Ambulatory Visit: Payer: Self-pay

## 2022-09-07 ENCOUNTER — Other Ambulatory Visit (HOSPITAL_COMMUNITY): Payer: Self-pay

## 2022-09-07 ENCOUNTER — Encounter: Payer: Self-pay | Admitting: Oncology

## 2022-09-07 ENCOUNTER — Inpatient Hospital Stay: Payer: Medicare Other | Admitting: Pharmacist

## 2022-09-07 ENCOUNTER — Ambulatory Visit
Admission: RE | Admit: 2022-09-07 | Discharge: 2022-09-07 | Disposition: A | Discharge status: Home / Self Care | Payer: Medicare Other | Source: Ambulatory Visit | Attending: Radiation Oncology | Admitting: Radiation Oncology

## 2022-09-07 DIAGNOSIS — C3492 Malignant neoplasm of unspecified part of left bronchus or lung: Secondary | ICD-10-CM

## 2022-09-07 DIAGNOSIS — Z510 Encounter for antineoplastic radiation therapy: Secondary | ICD-10-CM | POA: Diagnosis not present

## 2022-09-07 LAB — RAD ONC ARIA SESSION SUMMARY
Course Elapsed Days: 6
Plan Fractions Treated to Date: 5
Plan Prescribed Dose Per Fraction: 2 Gy
Plan Total Fractions Prescribed: 35
Plan Total Prescribed Dose: 70 Gy
Reference Point Dosage Given to Date: 10 Gy
Reference Point Session Dosage Given: 2 Gy
Session Number: 5

## 2022-09-07 MED ORDER — AFATINIB DIMALEATE 30 MG PO TABS
30.0000 mg | ORAL_TABLET | Freq: Every day | ORAL | 0 refills | Status: DC
Start: 2022-09-07 — End: 2022-09-29
  Filled 2022-09-07 (×2): qty 30, 30d supply, fill #0

## 2022-09-07 NOTE — Telephone Encounter (Addendum)
Oral Oncology Patient Advocate Encounter   **PAP was denied and funding for NSCLC opened**  Was successful in securing patient an $4,100.00 grant from [employer] [facility]) to provide copayment coverage for Gilotrif.  This will keep the out of pocket expense at $0.    The billing information is as follows and has been shared with [facility].   Member ID: [id number] Group ID: [id number] RxBin: [id number] Dates of Eligibility: 06/08/22 through 09/05/23  Fund:  Non-Small Cell Lung Cancer   Ardeen Fillers, CPhT Oncology Pharmacy Patient Advocate  [facility]  [phone] (phone) [phone] (fax) 09/07/2022 1:39 PM

## 2022-09-07 NOTE — Telephone Encounter (Signed)
Please schedule pt for lab(cbc,cmp)/MD/ IVF approx 7-10 after 7/19. Please inform pt of appts

## 2022-09-07 NOTE — Telephone Encounter (Signed)
-----   Message from Remi Haggard sent at 09/07/2022  2:20 PM EDT ----- Patient will have his medication in hand to get started on this Friday 09/09/22.  Nadara Mode ----- Message ----- From: Rickard Patience, MD Sent: 09/03/2022   2:01 PM EDT To: Coralee Rud, RN; Paulita Fujita, CMA; #  I recommend him to start Afatanib 30mg  daily. I put a "non print' Rx in [facility].  I plan to see him 7-10 days after started on medication, port lab MD cbc cmp +/- IVF

## 2022-09-07 NOTE — Telephone Encounter (Signed)
Oral Oncology Patient Advocate Encounter   Received notification that the application for assistance for Gilotrif through [facility] Patient Assistance Program has been denied due to patient's household exceeds income limits for 2024.   [employer]' phone number [phone].   Ardeen Fillers, CPhT Oncology Pharmacy Patient Advocate  [facility]  [phone] (phone) [phone] (fax) 09/07/2022 8:02 AM

## 2022-09-07 NOTE — Telephone Encounter (Signed)
Thanks. Please add Alyson (pharmacy) as well, to same day he see's dr. Cathie Hoops.

## 2022-09-07 NOTE — Progress Notes (Signed)
Clinical Pharmacist Practitioner Clinic [facility]  Telephone:([phone] Fax:([phone]) [phone]  Patient Care Team: Lauro Regulus, MD as PCP - General (Internal Medicine) Glory Buff, RN as Oncology Nurse Navigator   Name of the patient: Wesley Young  [id number]  07/10/1940   Date of visit: 09/07/22  HPI: Patient is a 82 y.o. male with metastatic NSCLC, EGFR mutation positive . Planned treatment with afatinib, which patient will start on 09/09/22.  Reason for Consult: afatinib oral chemotherapy education.   PAST MEDICAL HISTORY: Past Medical History:  Diagnosis Date   Abnormal PSA    s/p post prostate biopsy in the past which was negative   Actinic keratosis 03/14/2007   L ant lat neck at base of neck - bx proven    Actinic keratosis 01/08/2014   R calf - bx proven    Alcoholism (HCC)    with prolonged hospitalization 2009 for withdrawal c/b aspiration pneumonia requiring vent   Anxiety    Atrial fibrillation and flutter (HCC)    pt denies afib.   B12 deficiency    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/2015   L upper back paraspinal - excision    Hx of squamous cell carcinoma of skin 2014   multiple sites    Hypertension    Impotence    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HCC)    Sleep apnea    Spinal stenosis    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 Stopped for 3 days and again patient experienced hemoptysis which prompted him to go to emergency room on 08/02/2022 for further evaluation. CT findings showed 7.1 x 6.0 x 4.4 cm partially necrotic central left lung mass, extensive left hilar adenopathy and borderline enlarged prevascular and subcarinal lymph nodes.  08/05/2022 patient underwent biopsy via bronchoscopy by Dr. Karna Christmas. Left lower lobe ENB assisted biopsy showed squamous cell carcinoma. Left lower lobe lavage-suspicious for malignancy Station 10 R lymph node FNA negative for malignancy Left lower lobe bronchoscopy with brushing-positive for malignancy-non-small cell carcinoma Left lower lobe bronchoscopy with FNA positive for malignancy-non-small cell carcinoma Station 7 lymph node FNA-negative Station 10 L lymph node FNA suspicious  for malignancy     08/02/2022 Imaging   CT chest with contrast showed 1. 7.1 x 6.0 x 4.4 cm partially necrotic central left lung mass involving both the inferior aspect of the left upper lobe and the superior aspect of the left lower lobe. Findings consistent with a primary neoplasm. Recommend bronchoscopic biopsy. 2. Extensive left hilar adenopathy and borderline enlarged prevascular and subcarinal lymph nodes. 3. No worrisome pulmonary nodules to suggest pulmonary metastatic disease. 4. No findings for upper abdominal metastatic disease  or osseous metastatic disease. 5. Age related atherosclerotic calcifications involving the thoracic and abdominal aorta and branch vessels including the coronary arteries. 6. Aortic atherosclerosis.  CT abdomen pelvis with contrast showed no evidence of primary malignancy or metastatic disease in the abdomen/pelvis.  Enlarged prostate.  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  Chronic pancreatitis.      08/11/2022 Cancer Staging   Staging form: Lung, AJCC 8th Edition - Clinical stage from 08/11/2022: Stage IIIA (cT4, cN1, cM0) - Signed by Rickard Patience, MD on 08/11/2022 Stage prefix: Initial diagnosis   08/18/2022 Procedure   Medi port placed by Dr. Wyn Quaker.   09/01/2022 -  Radiation Therapy   Radiation to lung    09/03/2022 -  Chemotherapy   Patient is on Treatment Plan : LUNG NSCLC Afatinib     Metastasis to brain (HCC)  09/03/2022 -  Chemotherapy   Patient is on Treatment Plan : LUNG NSCLC Afatinib     09/03/2022 Initial Diagnosis   Metastasis to brain [facility])     ALLERGIES:  is allergic to ativan [lorazepam], benadryl [diphenhydramine], crestor [rosuvastatin calcium], nsaids, rapaflo [silodosin], statins, zetia [ezetimibe], latex, and neosporin [neomycin-bacitracin zn-polymyx].  MEDICATIONS:  Current Outpatient Medications  Medication Sig Dispense Refill   acetaminophen (TYLENOL) 325 MG tablet Take 650 mg by mouth every 6 (six) hours as needed.     afatinib dimaleate (GILOTRIF) 30 MG tablet Take 1 tablet (30 mg total) by mouth daily. Take on an empty stomach 1hr before or 2hrs after meals. 30 tablet 0   Apoaequorin (PREVAGEN PO) Take by mouth daily.     aspirin EC 81 MG tablet Take 81 mg by mouth daily. Swallow whole. (Patient not taking: Reported on 08/16/2022)     benzonatate (TESSALON) 200 MG capsule Take 1 capsule (200 mg total) by mouth 3 (three) times daily as needed for cough. 20 capsule 0   Calcium Citrate-Vitamin D (CITRACAL + D PO) Take by mouth 2 (two) times daily.      cyanocobalamin (VITAMIN B12) 1000 MCG/ML injection Inject 1,000 mcg into the muscle every 30 (thirty) days.     donepezil (ARICEPT) 5 MG tablet Take 5 mg by mouth in the morning.     finasteride (PROSCAR) 5 MG tablet Take 5 mg daily by mouth.     finasteride (PROSCAR) 5 MG tablet Take 1 tablet by mouth daily.     KRILL OIL PO Take by mouth daily.     lidocaine-prilocaine (EMLA) cream Apply to affected area once (Patient not taking: Reported on 09/02/2022) 30 g 3   loperamide (IMODIUM) 2 MG capsule Take 1 capsule (2 mg total) by mouth See admin instructions. Initial: 4 mg,the 2 mg every 2 hours (4 mg every 4 hours at night)  maximum: 16 mg/day 60 capsule 2   losartan (COZAAR) 50 MG tablet Take 50 mg by mouth daily.     ondansetron (ZOFRAN) 8 MG tablet Take 1 tablet (8 mg total) by mouth every 8 (eight) hours as needed  for nausea or vomiting. Start on the third day after chemotherapy. (Patient not taking: Reported on 08/16/2022) 30 tablet 1   polyethylene glycol (MIRALAX / GLYCOLAX) 17 g packet Take 17 g by mouth daily as needed.     pramipexole (MIRAPEX) 0.5 MG tablet Take 0.5 mg by mouth at bedtime.     Probiotic Product (ALIGN PO) Take daily by mouth.     prochlorperazine (COMPAZINE) 10 MG tablet Take 1 tablet (10 mg total) by mouth every 6 (six) hours as needed for nausea or vomiting. 30 tablet 1   tamsulosin (FLOMAX) 0.4 MG CAPS capsule Take 0.4 mg by mouth daily.     Thiamine HCl (VITAMIN B1 PO) Take 1 tablet by mouth daily. (Patient not taking: Reported on 08/18/2022)     traMADol (ULTRAM) 50 MG tablet Take 50 mg every 6 (six) hours as needed by mouth.     No current facility-administered medications for this visit.    VITAL SIGNS: There were no vitals taken for this visit. There were no vitals filed for this visit.  Estimated body mass index is 23.87 kg/m as calculated from the following:   Height as of 08/05/22: 6' (1.829 m).   Weight as of 09/02/22: 79.8 kg (176 lb).  LABS: CBC:     Component Value Date/Time   WBC 8.9 09/02/2022 1256   WBC 7.7 08/04/2022 0429   HGB 11.1 (L) 09/02/2022 1256   HCT 34.2 (L) 09/02/2022 1256   PLT 281 09/02/2022 1256   MCV 91.9 09/02/2022 1256   NEUTROABS 7.0 09/02/2022 1256   LYMPHSABS 0.7 09/02/2022 1256   MONOABS 0.7 09/02/2022 1256   EOSABS 0.3 09/02/2022 1256   BASOSABS 0.1 09/02/2022 1256   Comprehensive Metabolic Panel:    Component Value Date/Time   NA 133 (L) 09/02/2022 1256   K 4.2 09/02/2022 1256   CL 100 09/02/2022 1256   CO2 25 09/02/2022 1256   BUN 18 09/02/2022 1256   CREATININE 0.54 (L) 09/02/2022 1256   GLUCOSE 96 09/02/2022 1256   CALCIUM 9.4 09/02/2022 1256   AST 15 09/02/2022 1256   ALT 22 09/02/2022 1256   ALKPHOS 114 09/02/2022 1256   BILITOT 0.3 09/02/2022 1256   PROT 6.6 09/02/2022 1256   ALBUMIN 3.6 09/02/2022 1256     Present during today's visit: patient and his wife  Start plan: Patient will start afatinib when in hand on 09/09/22   Patient Education I spoke with patient for overview of new oral chemotherapy medication: afatinib   Administration: Counseled patient on administration, dosing, side effects, monitoring, drug-food interactions, safe handling, storage, and disposal. Patient will take 1 tablet (30 mg total) by mouth daily. Take on an empty stomach 1hr before or 2hrs after meals.   Side Effects: Side effects include but not limited to: rash/acne-like rash, diarrhea, nail changes, mouth sores.    Drug-drug Interactions (DDI): No current DDIs with afatinib  Adherence: After discussion with patient no patient barriers to medication adherence identified.  Reviewed with patient importance of keeping a medication schedule and plan for any missed doses.  The [facility] voiced understanding and appreciation. All questions answered. Medication handout provided.  Provided patient with Oral Chemotherapy Navigation Clinic phone number. Patient knows to call the office with questions or  concerns. Oral Chemotherapy Navigation Clinic will continue to follow.  Patient expressed understanding and was in agreement with this plan. He also understands that He can call clinic at any time with any questions, concerns, or complaints.  Medication Access Issues: [employer]Special[employer] to deliver med on 09/09/22  Follow-up plan: RTC in 7-10 days for MD follow  Thank you for allowing me to participate in the care of this patient.   Time Total: 40 mins  Visit consisted of counseling and education on dealing with issues of symptom management in the setting of serious and potentially life-threatening illness.Greater than 50%  of this time was spent counseling and coordinating care related to the above assessment and plan.  Signed by: Remi Haggard, PharmD, BCPS, Nolon Bussing, CPP Hematology/Oncology Clinical Pharmacist Practitioner [place] [phone]  09/07/2022 3:16 PM

## 2022-09-07 NOTE — Telephone Encounter (Signed)
Patient successfully On[employer] and drug education provided by pharmacist. Medication scheduled to be shipped on 09/08/22 for delivery on 09/09/22 from [facility] to patient's address. Patient also knows to call me at [phone] with any questions or concerns regarding receiving medication or if there is any unexpected change in co-pay.    Ardeen Fillers, CPhT Oncology Pharmacy Patient Advocate  [facility]  [phone] (phone) [phone] (fax) 09/07/2022 2:32 PM

## 2022-09-08 ENCOUNTER — Inpatient Hospital Stay (HOSPITAL_BASED_OUTPATIENT_CLINIC_OR_DEPARTMENT_OTHER): Payer: Medicare Other | Admitting: Hospice and Palliative Medicine

## 2022-09-08 ENCOUNTER — Ambulatory Visit
Admission: RE | Admit: 2022-09-08 | Discharge: 2022-09-08 | Disposition: A | Discharge status: Home / Self Care | Payer: Medicare Other | Source: Ambulatory Visit | Attending: Radiation Oncology | Admitting: Radiation Oncology

## 2022-09-08 ENCOUNTER — Other Ambulatory Visit (HOSPITAL_COMMUNITY): Payer: Self-pay

## 2022-09-08 ENCOUNTER — Other Ambulatory Visit: Payer: Self-pay

## 2022-09-08 ENCOUNTER — Encounter: Payer: Self-pay | Admitting: Hospice and Palliative Medicine

## 2022-09-08 VITALS — BP 112/52 | HR 74 | Temp 97.6°F | Resp 19 | Wt 175.5 lb

## 2022-09-08 DIAGNOSIS — Z515 Encounter for palliative care: Secondary | ICD-10-CM | POA: Diagnosis not present

## 2022-09-08 DIAGNOSIS — Z510 Encounter for antineoplastic radiation therapy: Secondary | ICD-10-CM | POA: Diagnosis not present

## 2022-09-08 DIAGNOSIS — C3492 Malignant neoplasm of unspecified part of left bronchus or lung: Secondary | ICD-10-CM | POA: Diagnosis not present

## 2022-09-08 LAB — RAD ONC ARIA SESSION SUMMARY
Course Elapsed Days: 7
Plan Fractions Treated to Date: 6
Plan Prescribed Dose Per Fraction: 2 Gy
Plan Total Fractions Prescribed: 35
Plan Total Prescribed Dose: 70 Gy
Reference Point Dosage Given to Date: 12 Gy
Reference Point Session Dosage Given: 2 Gy
Session Number: 6

## 2022-09-08 NOTE — Progress Notes (Signed)
Palliative Medicine [facility] at [facility] [phone] [phone]   Name: Wesley Young Date: 09/08/2022 MRN: [id number]  DOB: 03/27/1940  Patient Care Team: Lauro Regulus, MD as PCP - General (Internal Medicine) Glory Buff, RN as Oncology Nurse Navigator    REASON FOR CONSULTATION: Wesley Young is a 82 y.o. 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  Patient is referred to palliative care to address goals and manage ongoing symptoms.  SOCIAL HISTORY:     reports that he quit smoking about 47 years ago. His smoking use included pipe and cigarettes. He started smoking about 67 years ago. He has never used smokeless tobacco. He reports that he does not currently use alcohol. He reports that he does not currently use drugs.  Patient is married lives at home with his wife.  He has two daughters and three sons.  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  ADVANCE DIRECTIVES:    CODE STATUS:   PAST MEDICAL HISTORY: Past Medical History:  Diagnosis Date   Abnormal PSA    s/p post prostate biopsy in the past which was negative   Actinic keratosis 03/14/2007   L ant lat neck at base of neck - bx proven    Actinic keratosis 01/08/2014   R calf - bx proven    Alcoholism (HCC)    with prolonged hospitalization 2009 for withdrawal c/b aspiration pneumonia requiring vent   Anxiety    Atrial fibrillation and flutter (HCC)    pt denies afib.   B12 deficiency    Basal cell carcinoma 03/14/2007   R distal lat tricep near elbow    Basal cell carcinoma 03/06/2014   R calf - excision 04/22/2014   Basal cell carcinoma 08/30/2021   suprasternal area, treated with EDC   Cardiomyopathy (HCC)    mild  probably multifactorial secondary to HTn and possible alcohol contribution. 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/2015   L upper back paraspinal - excision    Hx of squamous cell carcinoma of skin 2014   multiple sites   Hypertension    Impotence    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HCC)    Sleep apnea    Spinal stenosis    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   L forearm - excision    Squamous cell carcinoma of skin 12/16/2015   R dorsum hand    Squamous cell carcinoma of skin 10/02/2017   R dorsum hand    Varicose veins of both lower extremities     PAST SURGICAL HISTORY:  Past Surgical History:  Procedure Laterality Date   CATARACT EXTRACTION W/PHACO Left 03/12/2019   Procedure: CATARACT EXTRACTION PHACO AND  INTRAOCULAR LENS PLACEMENT (IOC) LEFT 5.27 00:38.6;  Surgeon: Galen Manila, MD;  Location: [facility];  Service: Ophthalmology;  Laterality: Left;  sleep apnea   CATARACT EXTRACTION W/PHACO Right 04/09/2019   Procedure: CATARACT EXTRACTION PHACO AND INTRAOCULAR LENS PLACEMENT (IOC) RIGHT 4.84 00:52.4;  Surgeon: Galen Manila, MD;  Location: [facility];  Service: Ophthalmology;  Laterality: Right;   colonoscopy with polypectomy  12/2016   COLONOSCOPY WITH PROPOFOL N/A 12/27/2016   Procedure: COLONOSCOPY WITH PROPOFOL;  Surgeon: Toledo, Boykin Nearing, MD;  Location: [facility] END[facility];  Service: Endoscopy;  Laterality: N/A;   COLONOSCOPY WITH PROPOFOL N/A 03/23/2020   Procedure: COLONOSCOPY WITH PROPOFOL;  Surgeon: Toledo,  Boykin Nearing, MD;  Location: [facility] END[facility]COPY;  Service: Gastroenterology;  Laterality: N/A;   ESOPHAGOGASTRODUODENOSCOPY (EGD) WITH PROPOFOL N/A 09/21/2017   Procedure: ESOPHAGOGASTRODUODENOSCOPY (EGD) WITH PROPOFOL;  Surgeon: Christena Deem, MD;  Location: [facility];  Service: Endoscopy;  Laterality: N/A;   ESOPHAGOGASTRODUODENOSCOPY (EGD) WITH PROPOFOL N/A 03/23/2020   Procedure: ESOPHAGOGASTRODUODENOSCOPY (EGD) WITH PROPOFOL;  Surgeon: Toledo, Boykin Nearing, MD;  Location: [facility] ENDOSCOPY;  Service: Gastroenterology;  Laterality: N/A;   melonoma removal     MOHS SURGERY     on top of head   PORTA CATH INSERTION N/A 08/18/2022   Procedure: PORTA CATH INSERTION;  Surgeon: Annice Needy, MD;  Location: [facility] INV[facility];  Service: Cardiovascular;  Laterality: N/A;   prostat biopsy x2     right deltoid resection in 1977     s/p resection of his right deltoid muscle in 1977     TONSILLECTOMY     TONSILLECTOMY AND ADENOIDECTOMY     VIDEO BRONCHOSCOPY WITH ENDOBRONCHIAL ULTRASOUND N/A 08/05/2022   Procedure: VIDEO BRONCHOSCOPY WITH ENDOBRONCHIAL ULTRASOUND;  Surgeon: Vida Rigger, MD;  Location: [facility] ORS;  Service: Thoracic;  Laterality: N/A;    HEMATOLOGY/ONCOLOGY HISTORY:  Oncology History  Squamous cell carcinoma lung (HCC)  08/02/2022 Initial Diagnosis   Squamous cell carcinoma lung (HCC)  07/18/2022, patient developed hemoptysis nitially very small amount. Stopped for 3 days and again patient experienced hemoptysis which prompted him to go to emergency room on 08/02/2022 for further evaluation. CT findings showed 7.1 x 6.0 x 4.4 cm partially necrotic central left lung mass, extensive left hilar adenopathy and borderline enlarged prevascular and subcarinal lymph nodes.  08/05/2022 patient underwent biopsy via bronchoscopy by Dr. Karna Christmas. Left lower lobe ENB assisted biopsy showed squamous cell carcinoma. Left lower lobe lavage-suspicious for malignancy Station 10 R lymph node FNA  negative for malignancy Left lower lobe bronchoscopy with brushing-positive for malignancy-non-small cell carcinoma Left lower lobe bronchoscopy with FNA positive for malignancy-non-small cell carcinoma Station 7 lymph node FNA-negative Station 10 L lymph node FNA suspicious for malignancy     08/02/2022 Imaging   CT chest with contrast showed 1. 7.1 x 6.0 x 4.4 cm partially necrotic central left lung mass involving both the inferior aspect of the left upper lobe and the superior aspect of the left lower lobe. Findings consistent with a primary neoplasm. Recommend bronchoscopic biopsy. 2. Extensive left hilar adenopathy and borderline enlarged prevascular and subcarinal lymph nodes. 3. No worrisome pulmonary nodules to suggest pulmonary metastatic disease. 4. No findings for upper abdominal metastatic disease or osseous metastatic disease. 5. Age related atherosclerotic calcifications involving the thoracic and abdominal aorta and branch vessels including the coronary arteries. 6. Aortic atherosclerosis.  CT abdomen pelvis with contrast showed no evidence of primary malignancy or metastatic disease in the abdomen/pelvis.  Enlarged prostate.  Chronic  diverticulosis without diverticulitis.  Chronic pancreatitis.      08/11/2022 Cancer Staging   Staging form: Lung, AJCC 8th Edition - Clinical stage from 08/11/2022: Stage IIIA (cT4, cN1, cM0) - Signed by Rickard Patience, MD on 08/11/2022 Stage prefix: Initial diagnosis   08/18/2022 Procedure   Medi port placed by Dr. Wyn Quaker.   09/01/2022 -  Radiation Therapy   Radiation to lung    09/03/2022 -  Chemotherapy   Patient is on Treatment Plan : LUNG NSCLC Afatinib     Metastasis to brain (HCC)  09/03/2022 -  Chemotherapy   Patient is on Treatment Plan : LUNG NSCLC Afatinib     09/03/2022 Initial Diagnosis   Metastasis to brain [facility])     ALLERGIES:  is allergic to ativan [lorazepam], benadryl [diphenhydramine], crestor [rosuvastatin calcium], nsaids,  rapaflo [silodosin], statins, zetia [ezetimibe], latex, and neosporin [neomycin-bacitracin zn-polymyx].  MEDICATIONS:  Current Outpatient Medications  Medication Sig Dispense Refill   acetaminophen (TYLENOL) 325 MG tablet Take 650 mg by mouth every 6 (six) hours as needed.     afatinib dimaleate (GILOTRIF) 30 MG tablet Take 1 tablet (30 mg total) by mouth daily. Take on an empty stomach 1hr before or 2hrs after meals. 30 tablet 0   Apoaequorin (PREVAGEN PO) Take by mouth daily.     aspirin EC 81 MG tablet Take 81 mg by mouth daily. Swallow whole. (Patient not taking: Reported on 08/16/2022)     benzonatate (TESSALON) 200 MG capsule Take 1 capsule (200 mg total) by mouth 3 (three) times daily as needed for cough. 20 capsule 0   Calcium Citrate-Vitamin D (CITRACAL + D PO) Take by mouth 2 (two) times daily.     cyanocobalamin (VITAMIN B12) 1000 MCG/ML injection Inject 1,000 mcg into the muscle every 30 (thirty) days.     donepezil (ARICEPT) 5 MG tablet Take 5 mg by mouth in the morning.     finasteride (PROSCAR) 5 MG tablet Take 5 mg daily by mouth.     finasteride (PROSCAR) 5 MG tablet Take 1 tablet by mouth daily.     KRILL OIL PO Take by mouth daily.     lidocaine-prilocaine (EMLA) cream Apply to affected area once (Patient not taking: Reported on 09/02/2022) 30 g 3   loperamide (IMODIUM) 2 MG capsule Take 1 capsule (2 mg total) by mouth See admin instructions. Initial: 4 mg,the 2 mg every 2 hours (4 mg every 4 hours at night)  maximum: 16 mg/day 60 capsule 2   losartan (COZAAR) 50 MG tablet Take 50 mg by mouth daily.     ondansetron (ZOFRAN) 8 MG tablet Take 1 tablet (8 mg total) by mouth every 8 (eight) hours as needed for nausea or vomiting. Start on the third day after chemotherapy. (Patient not taking: Reported on 08/16/2022) 30 tablet 1   polyethylene glycol (MIRALAX / GLYCOLAX) 17 g packet Take 17 g by mouth daily as needed.     pramipexole (MIRAPEX) 0.5 MG tablet Take 0.5 mg by mouth at  bedtime.     Probiotic Product (ALIGN PO) Take daily by mouth.     prochlorperazine (COMPAZINE) 10 MG tablet Take 1 tablet (10 mg total) by mouth every 6 (six) hours as needed for nausea or vomiting. 30 tablet 1   tamsulosin (FLOMAX) 0.4 MG CAPS capsule Take 0.4 mg by mouth daily.     Thiamine HCl (VITAMIN B1 PO) Take 1 tablet by mouth daily. (Patient not taking: Reported on 08/18/2022)  traMADol (ULTRAM) 50 MG tablet Take 50 mg every 6 (six) hours as needed by mouth.     No current facility-administered medications for this visit.    VITAL SIGNS: Wt 175 lb 8 oz (79.6 kg)   BMI 23.80 kg/m  Filed Weights   09/08/22 1444  Weight: 175 lb 8 oz (79.6 kg)    Estimated body mass index is 23.8 kg/m as calculated from the following:   Height as of 08/05/22: 6' (1.829 m).   Weight as of this encounter: 175 lb 8 oz (79.6 kg).  LABS: CBC:    Component Value Date/Time   WBC 8.9 09/02/2022 1256   WBC 7.7 08/04/2022 0429   HGB 11.1 (L) 09/02/2022 1256   HCT 34.2 (L) 09/02/2022 1256   PLT 281 09/02/2022 1256   MCV 91.9 09/02/2022 1256   NEUTROABS 7.0 09/02/2022 1256   LYMPHSABS 0.7 09/02/2022 1256   MONOABS 0.7 09/02/2022 1256   EOSABS 0.3 09/02/2022 1256   BASOSABS 0.1 09/02/2022 1256   Comprehensive Metabolic Panel:    Component Value Date/Time   NA 133 (L) 09/02/2022 1256   K 4.2 09/02/2022 1256   CL 100 09/02/2022 1256   CO2 25 09/02/2022 1256   BUN 18 09/02/2022 1256   CREATININE 0.54 (L) 09/02/2022 1256   GLUCOSE 96 09/02/2022 1256   CALCIUM 9.4 09/02/2022 1256   AST 15 09/02/2022 1256   ALT 22 09/02/2022 1256   ALKPHOS 114 09/02/2022 1256   BILITOT 0.3 09/02/2022 1256   PROT 6.6 09/02/2022 1256   ALBUMIN 3.6 09/02/2022 1256    RADIOGRAPHIC STUDIES: MR Brain W Wo Contrast  Result Date: 08/21/2022 CLINICAL DATA:  Non-small cell lung cancer. EXAM: MRI HEAD WITHOUT AND WITH CONTRAST TECHNIQUE: Multiplanar, multiecho pulse sequences of the brain and surrounding  structures were obtained without and with intravenous contrast. CONTRAST:  7mL GADAVIST GADOBUTROL 1 MMOL/ML IV SOLN COMPARISON:  MR head without contrast 05/25/2022 FINDINGS: Brain: Moderate generalized atrophy is again noted. Mild periventricular white matter changes are stable. A remote lacunar infarct of the right lentiform nucleus is stable. T2 hyperintensities within the thalami bilaterally are stable. White matter changes extend into the brainstem, similar the prior exam. The cerebellum is within normal limits. The internal auditory canals are within normal limits. Postcontrast images demonstrate a ring-enhancing 6.5 mm metastasis along the medial left frontal lobe on image 134 of series 18. The 4 mm peripherally enhancing metastasis is present in the posterior right occipital lobe on image 76 of series 18. T2 hyperintensities surround both lesions without significant mass effect. Vascular: Flow is present in the major intracranial arteries. Skull and upper cervical spine: The craniocervical junction is normal. Upper cervical spine is within normal limits. Marrow signal is unremarkable. Sinuses/Orbits: A mild left mastoid effusion is noted. No obstructing nasopharyngeal lesion is present. The paranasal sinuses and mastoid air cells are otherwise clear. Bilateral lens replacements are noted. Globes and orbits are otherwise unremarkable. IMPRESSION: 1. 6.5 mm ring-enhancing metastasis along the medial left frontal lobe. 2. 4 mm peripherally enhancing metastasis in the posterior right occipital lobe. 3. Stable moderate generalized atrophy and white matter disease likely reflects the sequela of chronic microvascular ischemia. 4. Mild left mastoid effusion. No obstructing nasopharyngeal lesion is present. Electronically Signed   By: Marin Roberts M.D.   On: 08/21/2022 16:03   NM PET Image Initial (PI) Skull Base To Thigh  Result Date: 08/21/2022 CLINICAL DATA:  Initial treatment strategy for lung cancer  . EXAM: NUCLEAR  MEDICINE PET SKULL BASE TO THIGH TECHNIQUE: 9.9 mCi F-18 FDG was injected intravenously. Full-ring PET imaging was performed from the skull base to thigh after the radiotracer. CT data was obtained and used for attenuation correction and anatomic localization. Fasting blood glucose: 95 mg/dl COMPARISON:  CT chest 2/59/56 FINDINGS: Mediastinal blood pool activity: SUV max 2.27 Liver activity: SUV max NA NECK: No hypermetabolic lymph nodes in the neck. Incidental CT findings: None. CHEST: Central left lung subpleural mass within the perihilar and paravertebral left lung, which crosses the oblique fissure. This has a maximum diameter 7.06 cm within SUV max of 20.51, image 60/4. The mass surrounds the proximal left lower lobe bronchus, image 66/4. Mild postobstructive pneumonitis identified within the posterior and medial superior segment of the left lower lobe. There are no additional tracer avid pulmonary nodules or masses identified. No tracer avid mediastinal, right hilar, axillary or supraclavicular lymph nodes. Incidental CT findings: Aortic atherosclerosis. Coronary artery calcifications. Cardiac enlargement. ABDOMEN/PELVIS: No abnormal hypermetabolic activity within the liver, pancreas, adrenal glands, or spleen. No hypermetabolic lymph nodes in the abdomen or pelvis. 1.3 cm right inguinal lymph node is identified with SUV max of 2.30, image 160/4. Incidental CT findings: Calcifications identified within the pancreas suggestive of chronic pancreatitis. Aortic atherosclerosis. Large cyst off the lateral cortex of the left kidney measures 6.5 x 5.8 cm, image 101/4. No follow-up imaging recommended. SKELETON: No focal hypermetabolic activity to suggest skeletal metastasis. Incidental CT findings: None. IMPRESSION: 1. Large left lung mass is identified which crosses the oblique fissure and surrounds the proximal left lower lobe bronchus. This is consistent with primary bronchogenic carcinoma. Change  identified within the superior segment of the left lower lobe. No signs of mild postobstructive 2. No signs nodal or distant metastatic disease within the abdomen, pelvis or skeletal structures. 3. Coronary artery calcifications. 4. Signs of chronic pancreatitis. 5. Aortic Atherosclerosis (ICD10-I70.0). Electronically Signed   By: Signa Kell M.D.   On: 08/21/2022 16:01   PERIPHERAL VASCULAR CATHETERIZATION  Result Date: 08/18/2022 See surgical note for result.   PERFORMANCE STATUS (ECOG) : 2 - Symptomatic, <50% confined to bed  Review of Systems Unless otherwise noted, a complete review of systems is negative.  Physical Exam General: NAD Pulmonary: Unlabored Extremities: no edema, no joint deformities Skin: no rashes Neurological: Weakness but otherwise nonfocal  IMPRESSION: Patient with stage IV squamous cell carcinoma lung.  He is currently on XRT and was started on afatinib.  CT of the chest showed a large partially necrotic central left lung mass with extensive hilar adenopathy.  No evidence of metastatic disease in abdomen or pelvis.  I met with patient and wife today.  I introduced palliative care services and attempted to establish therapeutic rapport.  Patient in agreement with current scope of treatment.  Treatment is with palliative intent.  Symptomatically, he endorses chronic back pain for which she takes tramadol 50 mg every 6 hours and has done so for many years.  He has had occasional dull headaches, likely stemming from his brain metastasis.  No significant headaches currently.  No recurrent hemoptysis.  Patient is a recovering alcoholic, sober for 14 years.  He has declined stronger opioids in the past due to fear of relapse.  Patient reports his appetite has been good so far.  He is interested in nutrition consult.  Functionally, he ambulates with use of a walker due to severe peripheral neuropathy and weakness at baseline.  He is independent with ADLs.  He likely  would  benefit from future consultation with Marisue Humble.   PLAN: -Continue current scope of treatment -Referral to nutrition -Consider referral for rehab screening -Continue tramadol as needed -Will benefit from ACP conversation -Follow-up telephone visit 1 month   Patient expressed understanding and was in agreement with this plan. He also understands that He can call the clinic at any time with any questions, concerns, or complaints.     Time Total: 45 minutes  Visit consisted of counseling and education dealing with the complex and emotionally intense issues of symptom management and palliative care in the setting of serious and potentially life-threatening illness.Greater than 50%  of this time was spent counseling and coordinating care related to the above assessment and plan.  Signed by: Laurette Schimke, PhD, NP-C

## 2022-09-09 ENCOUNTER — Other Ambulatory Visit: Payer: Self-pay

## 2022-09-09 ENCOUNTER — Ambulatory Visit
Admission: RE | Admit: 2022-09-09 | Discharge: 2022-09-09 | Disposition: A | Discharge status: Home / Self Care | Payer: Medicare Other | Source: Ambulatory Visit | Attending: Radiation Oncology | Admitting: Radiation Oncology

## 2022-09-09 DIAGNOSIS — Z510 Encounter for antineoplastic radiation therapy: Secondary | ICD-10-CM | POA: Diagnosis not present

## 2022-09-09 LAB — RAD ONC ARIA SESSION SUMMARY
Course Elapsed Days: 8
Plan Fractions Treated to Date: 7
Plan Prescribed Dose Per Fraction: 2 Gy
Plan Total Fractions Prescribed: 35
Plan Total Prescribed Dose: 70 Gy
Reference Point Dosage Given to Date: 14 Gy
Reference Point Session Dosage Given: 2 Gy
Session Number: 7

## 2022-09-12 ENCOUNTER — Other Ambulatory Visit: Payer: Self-pay

## 2022-09-12 ENCOUNTER — Ambulatory Visit
Admission: RE | Admit: 2022-09-12 | Discharge: 2022-09-12 | Disposition: A | Discharge status: Home / Self Care | Payer: Medicare Other | Source: Ambulatory Visit | Attending: Radiation Oncology | Admitting: Radiation Oncology

## 2022-09-12 ENCOUNTER — Telehealth: Payer: Self-pay | Admitting: *Deleted

## 2022-09-12 DIAGNOSIS — Z510 Encounter for antineoplastic radiation therapy: Secondary | ICD-10-CM | POA: Diagnosis not present

## 2022-09-12 LAB — RAD ONC ARIA SESSION SUMMARY
Course Elapsed Days: 11
Plan Fractions Treated to Date: 8
Plan Prescribed Dose Per Fraction: 2 Gy
Plan Total Fractions Prescribed: 35
Plan Total Prescribed Dose: 70 Gy
Reference Point Dosage Given to Date: 16 Gy
Reference Point Session Dosage Given: 2 Gy
Session Number: 8

## 2022-09-12 NOTE — Telephone Encounter (Signed)
Patient called and states that he got his medicine but does not remember when he is to start taking it. Please advise

## 2022-09-12 NOTE — Telephone Encounter (Signed)
Call returned to wife and advised that he is to start it when he gets it so he can go ahead and start it. She repeated back to me and thanked me for calling

## 2022-09-13 ENCOUNTER — Ambulatory Visit: Payer: Medicare Other

## 2022-09-13 ENCOUNTER — Other Ambulatory Visit (HOSPITAL_COMMUNITY): Payer: Self-pay

## 2022-09-13 ENCOUNTER — Other Ambulatory Visit: Payer: Self-pay

## 2022-09-13 DIAGNOSIS — Z510 Encounter for antineoplastic radiation therapy: Secondary | ICD-10-CM | POA: Diagnosis not present

## 2022-09-13 LAB — RAD ONC ARIA SESSION SUMMARY
Course Elapsed Days: 12
Plan Fractions Treated to Date: 9
Plan Prescribed Dose Per Fraction: 2 Gy
Plan Total Fractions Prescribed: 35
Plan Total Prescribed Dose: 70 Gy
Reference Point Dosage Given to Date: 18 Gy
Reference Point Session Dosage Given: 2 Gy
Session Number: 9

## 2022-09-14 ENCOUNTER — Ambulatory Visit: Admission: RE | Admit: 2022-09-14 | Payer: Medicare Other | Source: Ambulatory Visit

## 2022-09-14 ENCOUNTER — Other Ambulatory Visit: Payer: Self-pay

## 2022-09-14 DIAGNOSIS — Z510 Encounter for antineoplastic radiation therapy: Secondary | ICD-10-CM | POA: Diagnosis not present

## 2022-09-14 LAB — RAD ONC ARIA SESSION SUMMARY
Course Elapsed Days: 13
Plan Fractions Treated to Date: 10
Plan Prescribed Dose Per Fraction: 2 Gy
Plan Total Fractions Prescribed: 35
Plan Total Prescribed Dose: 70 Gy
Reference Point Dosage Given to Date: 20 Gy
Reference Point Session Dosage Given: 2 Gy
Session Number: 10

## 2022-09-15 ENCOUNTER — Other Ambulatory Visit: Payer: Self-pay

## 2022-09-15 ENCOUNTER — Ambulatory Visit
Admission: RE | Admit: 2022-09-15 | Discharge: 2022-09-15 | Disposition: A | Discharge status: Home / Self Care | Payer: Medicare Other | Source: Ambulatory Visit | Attending: Radiation Oncology | Admitting: Radiation Oncology

## 2022-09-15 DIAGNOSIS — Z510 Encounter for antineoplastic radiation therapy: Secondary | ICD-10-CM | POA: Diagnosis not present

## 2022-09-15 LAB — RAD ONC ARIA SESSION SUMMARY
Course Elapsed Days: 14
Plan Fractions Treated to Date: 11
Plan Prescribed Dose Per Fraction: 2 Gy
Plan Total Fractions Prescribed: 35
Plan Total Prescribed Dose: 70 Gy
Reference Point Dosage Given to Date: 22 Gy
Reference Point Session Dosage Given: 2 Gy
Session Number: 11

## 2022-09-16 ENCOUNTER — Other Ambulatory Visit: Payer: Self-pay

## 2022-09-16 ENCOUNTER — Ambulatory Visit
Admission: RE | Admit: 2022-09-16 | Discharge: 2022-09-16 | Disposition: A | Discharge status: Home / Self Care | Payer: Medicare Other | Source: Ambulatory Visit | Attending: Radiation Oncology | Admitting: Radiation Oncology

## 2022-09-16 DIAGNOSIS — Z510 Encounter for antineoplastic radiation therapy: Secondary | ICD-10-CM | POA: Diagnosis not present

## 2022-09-16 LAB — RAD ONC ARIA SESSION SUMMARY
Course Elapsed Days: 15
Plan Fractions Treated to Date: 12
Plan Prescribed Dose Per Fraction: 2 Gy
Plan Total Fractions Prescribed: 35
Plan Total Prescribed Dose: 70 Gy
Reference Point Dosage Given to Date: 24 Gy
Reference Point Session Dosage Given: 2 Gy
Session Number: 12

## 2022-09-19 ENCOUNTER — Ambulatory Visit: Payer: Medicare Other

## 2022-09-19 ENCOUNTER — Other Ambulatory Visit: Payer: Medicare Other

## 2022-09-19 ENCOUNTER — Ambulatory Visit: Admission: RE | Admit: 2022-09-19 | Payer: Medicare Other | Source: Ambulatory Visit

## 2022-09-19 ENCOUNTER — Ambulatory Visit: Payer: Medicare Other | Admitting: Oncology

## 2022-09-19 ENCOUNTER — Other Ambulatory Visit: Payer: Self-pay

## 2022-09-19 ENCOUNTER — Ambulatory Visit: Payer: Medicare Other | Admitting: Pharmacist

## 2022-09-19 DIAGNOSIS — Z510 Encounter for antineoplastic radiation therapy: Secondary | ICD-10-CM | POA: Diagnosis not present

## 2022-09-19 LAB — RAD ONC ARIA SESSION SUMMARY
Course Elapsed Days: 18
Plan Fractions Treated to Date: 13
Plan Prescribed Dose Per Fraction: 2 Gy
Plan Total Fractions Prescribed: 35
Plan Total Prescribed Dose: 70 Gy
Reference Point Dosage Given to Date: 26 Gy
Reference Point Session Dosage Given: 2 Gy
Session Number: 13

## 2022-09-20 ENCOUNTER — Inpatient Hospital Stay: Payer: Medicare Other

## 2022-09-20 ENCOUNTER — Ambulatory Visit
Admission: RE | Admit: 2022-09-20 | Discharge: 2022-09-20 | Disposition: A | Discharge status: Home / Self Care | Payer: Medicare Other | Source: Ambulatory Visit | Attending: Radiation Oncology | Admitting: Radiation Oncology

## 2022-09-20 ENCOUNTER — Inpatient Hospital Stay (HOSPITAL_BASED_OUTPATIENT_CLINIC_OR_DEPARTMENT_OTHER): Payer: Medicare Other | Admitting: Oncology

## 2022-09-20 ENCOUNTER — Encounter: Payer: Self-pay | Admitting: *Deleted

## 2022-09-20 ENCOUNTER — Inpatient Hospital Stay: Payer: Medicare Other | Admitting: Pharmacist

## 2022-09-20 ENCOUNTER — Encounter: Payer: Self-pay | Admitting: Oncology

## 2022-09-20 ENCOUNTER — Other Ambulatory Visit: Payer: Self-pay

## 2022-09-20 VITALS — BP 136/67 | HR 63 | Temp 97.9°F | Resp 18 | Wt 171.2 lb

## 2022-09-20 DIAGNOSIS — C7931 Secondary malignant neoplasm of brain: Secondary | ICD-10-CM

## 2022-09-20 DIAGNOSIS — L708 Other acne: Secondary | ICD-10-CM | POA: Diagnosis not present

## 2022-09-20 DIAGNOSIS — C3492 Malignant neoplasm of unspecified part of left bronchus or lung: Secondary | ICD-10-CM | POA: Diagnosis not present

## 2022-09-20 DIAGNOSIS — Z95828 Presence of other vascular implants and grafts: Secondary | ICD-10-CM

## 2022-09-20 DIAGNOSIS — Z5111 Encounter for antineoplastic chemotherapy: Secondary | ICD-10-CM | POA: Insufficient documentation

## 2022-09-20 DIAGNOSIS — Z510 Encounter for antineoplastic radiation therapy: Secondary | ICD-10-CM | POA: Diagnosis not present

## 2022-09-20 LAB — CBC WITH DIFFERENTIAL (CANCER CENTER ONLY)
Abs Immature Granulocytes: 0.05 10*3/uL (ref 0.00–0.07)
Basophils Absolute: 0.1 10*3/uL (ref 0.0–0.1)
Basophils Relative: 1 %
Eosinophils Absolute: 0.1 10*3/uL (ref 0.0–0.5)
Eosinophils Relative: 1 %
HCT: 33.4 % — ABNORMAL LOW (ref 39.0–52.0)
Hemoglobin: 11 g/dL — ABNORMAL LOW (ref 13.0–17.0)
Immature Granulocytes: 1 %
Lymphocytes Relative: 4 %
Lymphs Abs: 0.4 10*3/uL — ABNORMAL LOW (ref 0.7–4.0)
MCH: 29.6 pg (ref 26.0–34.0)
MCHC: 32.9 g/dL (ref 30.0–36.0)
MCV: 90 fL (ref 80.0–100.0)
Monocytes Absolute: 0.8 10*3/uL (ref 0.1–1.0)
Monocytes Relative: 8 %
Neutro Abs: 8.6 10*3/uL — ABNORMAL HIGH (ref 1.7–7.7)
Neutrophils Relative %: 85 %
Platelet Count: 342 10*3/uL (ref 150–400)
RBC: 3.71 MIL/uL — ABNORMAL LOW (ref 4.22–5.81)
RDW: 12.6 % (ref 11.5–15.5)
WBC Count: 10 10*3/uL (ref 4.0–10.5)
nRBC: 0 % (ref 0.0–0.2)

## 2022-09-20 LAB — CMP (CANCER CENTER ONLY)
ALT: 18 U/L (ref 0–44)
AST: 15 U/L (ref 15–41)
Albumin: 3.4 g/dL — ABNORMAL LOW (ref 3.5–5.0)
Alkaline Phosphatase: 87 U/L (ref 38–126)
Anion gap: 6 (ref 5–15)
BUN: 14 mg/dL (ref 8–23)
CO2: 24 mmol/L (ref 22–32)
Calcium: 9.3 mg/dL (ref 8.9–10.3)
Chloride: 103 mmol/L (ref 98–111)
Creatinine: 0.58 mg/dL — ABNORMAL LOW (ref 0.61–1.24)
GFR, Estimated: >60 mL/min (ref >60)
Glucose, Bld: 110 mg/dL — ABNORMAL HIGH (ref 70–99)
Potassium: 4.1 mmol/L (ref 3.5–5.1)
Sodium: 133 mmol/L — ABNORMAL LOW (ref 135–145)
Total Bilirubin: 0.5 mg/dL (ref 0.3–1.2)
Total Protein: 6.6 g/dL (ref 6.5–8.1)

## 2022-09-20 LAB — RAD ONC ARIA SESSION SUMMARY
Course Elapsed Days: 19
Plan Fractions Treated to Date: 14
Plan Prescribed Dose Per Fraction: 2 Gy
Plan Total Fractions Prescribed: 35
Plan Total Prescribed Dose: 70 Gy
Reference Point Dosage Given to Date: 28 Gy
Reference Point Session Dosage Given: 2 Gy
Session Number: 14

## 2022-09-20 MED ORDER — CLINDAMYCIN PHOSPHATE 1 % EX GEL: Freq: Two times a day (BID) | CUTANEOUS | 1 refills | Status: DC

## 2022-09-20 MED ORDER — TRIAMCINOLONE ACETONIDE 0.1 % EX CREA: 1.0000 | TOPICAL_CREAM | Freq: Two times a day (BID) | CUTANEOUS | 1 refills | Status: DC

## 2022-09-20 MED ORDER — HEPARIN SOD (PORK) LOCK FLUSH 100 UNIT/ML IV SOLN
500.0000 [IU] | Freq: Once | INTRAVENOUS | Status: AC
  Administered 2022-09-20: 500 [IU] via INTRAVENOUS
  Filled 2022-09-20: qty 5

## 2022-09-20 MED ORDER — SODIUM CHLORIDE 0.9% FLUSH
10.0000 mL | Freq: Once | INTRAVENOUS | Status: AC
  Administered 2022-09-20: 10 mL via INTRAVENOUS
  Filled 2022-09-20: qty 10

## 2022-09-20 NOTE — Assessment & Plan Note (Signed)
Likely secondary to Afatinib Recommend patient to apply topical steroid cream as well as clindamycin gel

## 2022-09-20 NOTE — Assessment & Plan Note (Signed)
Chemotherapy plan as listed above 

## 2022-09-20 NOTE — Progress Notes (Signed)
Hematology/Oncology Progress note [phone] [phone]        REFERRING PROVIDER: Lauro Regulus, MD    CHIEF COMPLAINTS/PURPOSE OF CONSULTATION:  Squamous cell lung cancer   ASSESSMENT & PLAN:   Cancer Staging  Squamous cell carcinoma lung (HCC) Staging form: Lung, AJCC 8th Edition - Clinical stage from 08/11/2022: Stage IIIA (cT4, cN1, cM0) - Signed by Rickard Patience, MD on 08/11/2022   Squamous cell carcinoma lung (HCC) MRI brain result was discussed with patient. Small brain metastatic disease.  stage IV squamous cell carcinoma Labs reviewed and discussed with patient He tolerates afatinib 30 mg daily.  Continue. Continue follow-up with radiation oncology for radiation.  Metastasis to brain [facility]) Afatinib and Osimertinib both can cross blood brain barrier.  I recommend to hold off brain radiation and close monitor with MRI brain.  Repeat MRI in 2 to 3 months.  Acneiform rash Likely secondary to Afatinib Recommend patient to apply topical steroid cream as well as clindamycin gel  Encounter for antineoplastic chemotherapy Chemotherapy plan as listed above.  Port-A-Cath in place Recommend port flush every 6-8 weeks if not actively using Mediport   Orders Placed This Encounter  Procedures   CBC with Differential (Cancer Center Only)    Standing Status:   Future    Standing Expiration Date:   09/20/2023   CMP (Cancer Center only)    Standing Status:   Future    Standing Expiration Date:   09/20/2023   Follow-up 2 to 3 weeks To start chemotherapy when patient starts radiation. All questions were answered. The patient knows to call the clinic with any problems, questions or concerns.  Rickard Patience, MD, PhD [facility] Hematology Oncology 09/20/2022    HISTORY OF PRESENTING ILLNESS:  Wesley Young 82 y.o. 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  I have reviewed his chart and materials  related to his cancer extensively and collaborated history with the patient. Summary of oncologic history is as follows: Oncology History  Squamous cell carcinoma lung (HCC)  08/02/2022 Initial Diagnosis   Squamous cell carcinoma lung (HCC)  07/18/2022, patient developed hemoptysis nitially very small amount. Stopped for 3 days and again patient experienced hemoptysis which prompted him to go to emergency room on 08/02/2022 for further evaluation. CT findings showed 7.1 x 6.0 x 4.4 cm partially necrotic central left lung mass, extensive left hilar adenopathy and borderline enlarged prevascular and subcarinal lymph nodes.  08/05/2022 patient underwent biopsy via bronchoscopy by Dr. Karna Christmas. Left lower lobe ENB assisted biopsy showed squamous cell carcinoma. Left lower lobe lavage-suspicious for malignancy Station 10 R lymph node FNA negative for malignancy Left lower lobe bronchoscopy with brushing-positive for malignancy-non-small cell carcinoma Left lower lobe bronchoscopy with FNA positive for malignancy-non-small cell carcinoma Station 7 lymph node FNA-negative Station 10 L lymph node FNA suspicious for malignancy     08/02/2022 Imaging   CT chest with contrast showed 1. 7.1 x 6.0 x 4.4 cm partially necrotic central left lung mass involving both the inferior aspect of the left upper lobe and the superior aspect of the left lower lobe. Findings consistent with a primary neoplasm. Recommend bronchoscopic biopsy. 2. Extensive left hilar adenopathy and borderline enlarged prevascular and subcarinal lymph nodes. 3. No worrisome pulmonary nodules to suggest pulmonary metastatic disease. 4. No findings for upper abdominal metastatic disease or osseous metastatic disease. 5. Age related atherosclerotic calcifications involving the thoracic and abdominal aorta and branch vessels including the coronary  arteries. 6. Aortic atherosclerosis.  CT abdomen pelvis with contrast showed no evidence of  primary malignancy or metastatic disease in the abdomen/pelvis.  Enlarged prostate.  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  Chronic pancreatitis.      08/11/2022 Cancer Staging   Staging form: Lung, AJCC 8th Edition - Clinical stage from 08/11/2022: Stage IIIA (cT4, cN1, cM0) - Signed by Rickard Patience, MD on 08/11/2022 Stage prefix: Initial diagnosis   08/18/2022 Procedure   Medi port placed by Dr. Wyn Quaker.   09/01/2022 -  Radiation Therapy   Radiation to lung    09/03/2022 -  Chemotherapy   Patient is on Treatment Plan : LUNG NSCLC Afatinib     09/07/2022 -  Chemotherapy   Started afatinib 30 mg daily.   Metastasis to brain [facility])  09/03/2022 -  Chemotherapy   Patient is on Treatment Plan : LUNG NSCLC Afatinib     09/03/2022 Initial Diagnosis   Metastasis to brain [facility])    Patient is a former smoker.  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  Patient had a history of mustard gas exposure of right extremity during service.  In 1977, patient received cobalt radiation and surgery and since then has remained in remission. 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  He has history of uncontrolled bowel and family history of lumbar radiculopathy.  Longstanding general severe sensorimotor peripheral neuropathy.  He walks with a walker at baseline.  [name] is a 82 y.o. male who has above history reviewed by me today presents for follow up visit for Stage IV squamous cell carcinoma.  Patient has tolerated afatinib 30 mg well.  Denies any nausea vomiting diarrhea. He is getting lung radiation. + cough.  Symptom has improved. + Acneform rash, mostly around his neck, some on his face and behind his ears.  MEDICAL HISTORY:  Past Medical History:   Diagnosis Date   Abnormal PSA    s/p post prostate biopsy in the past which was negative   Actinic keratosis 03/14/2007   L ant lat neck at base of neck - bx proven    Actinic keratosis 01/08/2014   R calf - bx proven    Alcoholism (HCC)    with prolonged hospitalization 2009 for withdrawal c/b aspiration pneumonia requiring vent   Anxiety    Atrial fibrillation and flutter (HCC)    pt denies afib.   B12 deficiency    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/2015   L upper back paraspinal - excision    Hx of squamous cell carcinoma of skin 2014   multiple sites   Hypertension    Impotence    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HCC)    Sleep apnea    Spinal stenosis    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   L forearm - excision    Squamous cell carcinoma of skin 12/16/2015   R dorsum hand    Squamous cell carcinoma of skin 10/02/2017   R dorsum hand    Varicose veins of both lower extremities     SURGICAL HISTORY: Past Surgical History:  Procedure Laterality Date   CATARACT EXTRACTION W/PHACO Left 03/12/2019   Procedure: CATARACT EXTRACTION PHACO AND INTRAOCULAR LENS PLACEMENT (IOC) LEFT 5.27 00:38.6;  Surgeon: Galen Manila, MD;   Location: [facility];  Service: Ophthalmology;  Laterality: Left;  sleep apnea   CATARACT EXTRACTION W/PHACO Right 04/09/2019   Procedure: CATARACT EXTRACTION PHACO AND INTRAOCULAR LENS PLACEMENT (IOC) RIGHT 4.84 00:52.4;  Surgeon: Galen Manila, MD;  Location: [facility];  Service: Ophthalmology;  Laterality: Right;   colonoscopy with polypectomy  12/2016   COLONOSCOPY WITH PROPOFOL N/A 12/27/2016   Procedure: COLONOSCOPY WITH PROPOFOL;  Surgeon: Toledo, Boykin Nearing, MD;  Location: [facility];  Service: Endoscopy;  Laterality: N/A;   COLONOSCOPY WITH PROPOFOL N/A 03/23/2020   Procedure: COLONOSCOPY WITH PROPOFOL;  Surgeon: Toledo, Boykin Nearing, MD;  Location: [facility];  Service: Gastroenterology;  Laterality: N/A;   ESOPHAGOGASTRODUODENOSCOPY (EGD) WITH PROPOFOL N/A 09/21/2017   Procedure: ESOPHAGOGASTRODUODENOSCOPY (EGD) WITH PROPOFOL;  Surgeon: Christena Deem, MD;  Location: [facility];  Service: Endoscopy;  Laterality: N/A;   ESOPHAGOGASTRODUODENOSCOPY (EGD) WITH PROPOFOL N/A 03/23/2020   Procedure: ESOPHAGOGASTRODUODENOSCOPY (EGD) WITH PROPOFOL;  Surgeon: Toledo, Boykin Nearing, MD;  Location: [facility];  Service: Gastroenterology;  Laterality: N/A;   melonoma removal     MOHS SURGERY     on top of head   PORTA CATH INSERTION N/A 08/18/2022   Procedure: PORTA CATH INSERTION;  Surgeon: Annice Needy, MD;  Location: [facility];  Service: Cardiovascular;  Laterality: N/A;   prostat biopsy x2     right deltoid resection in 1977     s/p resection of his right deltoid muscle in 1977     TONSILLECTOMY     TONSILLECTOMY AND ADENOIDECTOMY     VIDEO BRONCHOSCOPY WITH ENDOBRONCHIAL ULTRASOUND N/A 08/05/2022   Procedure: VIDEO BRONCHOSCOPY WITH ENDOBRONCHIAL ULTRASOUND;  Surgeon: Vida Rigger, MD;  Location: [facility];  Service: Thoracic;  Laterality: N/A;    SOCIAL HISTORY: Social History   Socioeconomic History   Marital status: Married    Spouse  name: Not on file   Number of children: Not on file   Years of education: Not on file   Highest education level: Not on file  Occupational History   Not on file  Tobacco Use   Smoking status: Former    Current packs/day: 0.00    Types: Pipe, Cigarettes    Start date: 44    Quit date: 1977    Years since quitting: 47.6   Smokeless tobacco: Never  Vaping Use   Vaping status: Never Used  Substance and Sexual Activity   Alcohol use: Not Currently    Comment: quit 03/26/2008   Drug use: Not Currently   Sexual activity: Not Currently  Other Topics Concern   Not on file  Social History  Narrative   Not on file   Social Determinants of Health   Financial Resource Strain: Low Risk  (08/10/2022)   Received from [facility], [facility]   Overall Financial Resource Strain (CARDIA)    Difficulty of Paying Living Expenses: Not hard at all  Food Insecurity: No Food Insecurity (08/10/2022)   Received from [facility], [facility]   Hunger Vital Sign    Worried About Running Out of Food in the Last Year: Never true    Ran Out of Food in the Last Year: Never true  Transportation Needs: No Transportation Needs (08/10/2022)   Received from [facility], [facility]   [facility] - Transportation    In the past 12 months, has lack of transportation kept you from medical appointments or from getting medications?: No    Lack of Transportation (Non-Medical): No  Physical Activity: Not on file  Stress: Not on file  Social Connections: Not on file  Intimate Partner Violence: Not At Risk (08/02/2022)   Humiliation, Afraid, Rape, and Kick questionnaire    Fear of Current or Ex-Partner: No    Emotionally Abused: No    Physically Abused: No    Sexually Abused: No    FAMILY HISTORY: Family History  Problem Relation Age of Onset   Ovarian cancer Mother    Heart attack Father    Emphysema  Sister    Obesity Paternal Uncle     ALLERGIES:  is allergic to ativan [lorazepam], benadryl [diphenhydramine], crestor [rosuvastatin calcium], nsaids, rapaflo [silodosin], statins, zetia [ezetimibe], latex, and neosporin [neomycin-bacitracin zn-polymyx].  MEDICATIONS:  Current Outpatient Medications  Medication Sig Dispense Refill   acetaminophen (TYLENOL) 325 MG tablet Take 650 mg by mouth every 6 (six) hours as needed.     afatinib dimaleate (GILOTRIF) 30 MG tablet Take 1 tablet (30 mg total) by mouth daily. Take on an empty stomach 1hr before or 2hrs after meals. 30 tablet 0   Apoaequorin (PREVAGEN PO) Take by mouth daily.     benzonatate (TESSALON) 200 MG capsule Take 1 capsule (200 mg total) by mouth 3 (three) times daily as needed for cough. 20 capsule 0   Calcium Citrate-Vitamin D (CITRACAL + D PO) Take by mouth 2 (two) times daily.     clindamycin (CLINDAGEL) 1 % gel Apply topically 2 (two) times daily. 30 g 1   cyanocobalamin (VITAMIN B12) 1000 MCG/ML injection Inject 1,000 mcg into the muscle every 30 (thirty) days.     donepezil (ARICEPT) 5 MG tablet Take 5 mg by mouth in the morning.     finasteride (PROSCAR) 5 MG tablet Take 1 tablet by mouth daily.     KRILL OIL PO Take by mouth daily.     loperamide (IMODIUM) 2 MG capsule Take 1 capsule (2 mg total) by mouth See admin instructions. Initial: 4 mg,the 2 mg every 2 hours (4 mg every 4 hours at night)  maximum: 16 mg/day 60 capsule 2   losartan (COZAAR) 50 MG tablet Take 50 mg by mouth daily.     polyethylene glycol (MIRALAX / GLYCOLAX) 17 g packet Take 17 g by mouth daily as needed.     pramipexole (MIRAPEX) 0.5 MG tablet Take 0.5 mg by mouth at bedtime.     Probiotic Product (ALIGN PO) Take daily by mouth.     prochlorperazine (COMPAZINE) 10 MG tablet Take 1 tablet (10 mg total) by mouth every 6 (six) hours as  needed for nausea or vomiting. 30 tablet 1   tamsulosin (FLOMAX) 0.4 MG CAPS capsule Take 0.4 mg by mouth daily.      traMADol (ULTRAM) 50 MG tablet Take 50 mg every 6 (six) hours as needed by mouth.     triamcinolone cream (KENALOG) 0.1 % Apply 1 Application topically 2 (two) times daily. 30 g 1   lidocaine-prilocaine (EMLA) cream Apply to affected area once (Patient not taking: Reported on 09/02/2022) 30 g 3   ondansetron (ZOFRAN) 8 MG tablet Take 1 tablet (8 mg total) by mouth every 8 (eight) hours as needed for nausea or vomiting. Start on the third day after chemotherapy. (Patient not taking: Reported on 08/16/2022) 30 tablet 1   Thiamine HCl (VITAMIN B1 PO) Take 1 tablet by mouth daily. (Patient not taking: Reported on 08/18/2022)     No current facility-administered medications for this visit.    Review of Systems  Constitutional:  Positive for fatigue. Negative for appetite change, chills, fever and unexpected weight change.  HENT:   Negative for hearing loss and voice change.   Eyes:  Negative for eye problems and icterus.  Respiratory:  Positive for cough. Negative for chest tightness and shortness of breath.   Cardiovascular:  Negative for chest pain and leg swelling.  Gastrointestinal:  Negative for abdominal distention and abdominal pain.  Endocrine: Negative for hot flashes.  Genitourinary:  Negative for difficulty urinating, dysuria and frequency.   Musculoskeletal:  Positive for arthralgias.  Skin:  Positive for rash. Negative for itching.  Neurological:  Positive for extremity weakness (Chronic). Negative for light-headedness and numbness.  Hematological:  Negative for adenopathy. Does not bruise/bleed easily.  Psychiatric/Behavioral:  Negative for confusion.      PHYSICAL EXAMINATION: ECOG PERFORMANCE STATUS: 1 - Symptomatic but completely ambulatory  Vitals:   09/20/22 0956  BP: 136/67  Pulse: 63  Resp: 18  Temp: 97.9 F (36.6 C)  SpO2: 100%   Filed Weights   09/20/22 0956  Weight: 171 lb 3.2 oz (77.7 kg)    Physical Exam Constitutional:      General: He is not in acute  distress.    Appearance: He is not diaphoretic.  HENT:     Head: Normocephalic and atraumatic.     Nose: Nose normal.     Mouth/Throat:     Pharynx: No oropharyngeal exudate.  Eyes:     General: No scleral icterus.    Pupils: Pupils are equal, round, and reactive to light.  Cardiovascular:     Rate and Rhythm: Normal rate.     Heart sounds: No murmur heard. Pulmonary:     Effort: Pulmonary effort is normal. No respiratory distress.  Abdominal:     General: There is no distension.  Musculoskeletal:        General: Normal range of motion.     Cervical back: Normal range of motion and neck supple.  Skin:    General: Skin is warm and dry.  Neurological:     Mental Status: He is alert and oriented to person, place, and time. Mental status is at baseline.     Cranial Nerves: No cranial nerve deficit.     Motor: No abnormal muscle tone.  Psychiatric:        Mood and Affect: Mood and affect normal.      LABORATORY DATA:  I have reviewed the data as listed    Latest Ref Rng & Units 09/20/2022    9:34 AM 09/02/2022   12:56  PM 08/04/2022    4:29 AM  CBC  WBC 4.0 - 10.5 K/uL 10.0  8.9  7.7   Hemoglobin 13.0 - 17.0 g/dL 33.2  95.1  88.4   Hematocrit 39.0 - 52.0 % 33.4  34.2  35.2   Platelets 150 - 400 K/uL 342  281  270       Latest Ref Rng & Units 09/20/2022    9:34 AM 09/02/2022   12:56 PM 08/03/2022    4:35 AM  CMP  Glucose 70 - 99 mg/dL 166  96  063   BUN 8 - 23 mg/dL 14  18  19    Creatinine 0.61 - 1.24 mg/dL 0.16  0.10  9.32   Sodium 135 - 145 mmol/L 133  133  137   Potassium 3.5 - 5.1 mmol/L 4.1  4.2  4.0   Chloride 98 - 111 mmol/L 103  100  104   CO2 22 - 32 mmol/L 24  25  24    Calcium 8.9 - 10.3 mg/dL 9.3  9.4  9.1   Total Protein 6.5 - 8.1 g/dL 6.6  6.6    Total Bilirubin 0.3 - 1.2 mg/dL 0.5  0.3    Alkaline Phos 38 - 126 U/L 87  114    AST 15 - 41 U/L 15  15    ALT 0 - 44 U/L 18  22       RADIOGRAPHIC STUDIES: I have personally reviewed the radiological  images as listed and agreed with the findings in the report. No results found.

## 2022-09-20 NOTE — Assessment & Plan Note (Signed)
Recommend port flush every 6-8 weeks if not actively using Mediport

## 2022-09-20 NOTE — Assessment & Plan Note (Addendum)
Afatinib and Osimertinib both can cross blood brain barrier.  I recommend to hold off brain radiation and close monitor with MRI brain.  Repeat MRI in 2 to 3 months.

## 2022-09-20 NOTE — Assessment & Plan Note (Addendum)
MRI brain result was discussed with patient. Small brain metastatic disease.  stage IV squamous cell carcinoma Labs reviewed and discussed with patient He tolerates afatinib 30 mg daily.  Continue. Continue follow-up with radiation oncology for radiation.

## 2022-09-20 NOTE — Progress Notes (Signed)
Clinical Pharmacist Practitioner Clinic [facility]  [phone] [phone]  Patient Care Team: Lauro Regulus, MD as PCP - General (Internal Medicine) Glory Buff, RN as Oncology Nurse Navigator Rickard Patience, MD as Consulting Physician (Oncology)   Name of the patient: Wesley Young  [id number]  07-31-1940   Date of visit: 09/20/22  HPI: Patient is a 82 y.o. male with metastatic NSCLC, EGFR mutation positive. Patient started afatinib on 09/12/22.   Reason for Consult: Oral chemotherapy follow-up for afatinib therapy.   PAST MEDICAL HISTORY: Past Medical History:  Diagnosis Date   Abnormal PSA    s/p post prostate biopsy in the past which was negative   Actinic keratosis 03/14/2007   L ant lat neck at base of neck - bx proven    Actinic keratosis 01/08/2014   R calf - bx proven    Alcoholism (HCC)    with prolonged hospitalization 2009 for withdrawal c/b aspiration pneumonia requiring vent   Anxiety    Atrial fibrillation and flutter (HCC)    pt denies afib.   B12 deficiency    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/2015   L upper back paraspinal - excision    Hx of squamous cell carcinoma of skin  2014   multiple sites   Hypertension    Impotence    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HCC)    Sleep apnea    Spinal stenosis    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 Stopped for 3 days and again patient experienced hemoptysis which prompted him to go to emergency room on 08/02/2022 for further evaluation. CT findings showed 7.1 x 6.0 x 4.4 cm partially necrotic central left lung mass, extensive left hilar adenopathy and borderline enlarged prevascular and subcarinal lymph nodes.  08/05/2022 patient underwent biopsy via bronchoscopy by Dr. Karna Christmas. Left lower lobe ENB assisted biopsy showed squamous cell carcinoma. Left lower lobe lavage-suspicious for malignancy Station 10 R lymph node FNA negative for malignancy Left lower lobe bronchoscopy with brushing-positive for malignancy-non-small cell carcinoma Left lower lobe bronchoscopy with FNA positive for malignancy-non-small cell carcinoma Station 7 lymph node FNA-negative Station 10 L  lymph node FNA suspicious for malignancy     08/02/2022 Imaging   CT chest with contrast showed 1. 7.1 x 6.0 x 4.4 cm partially necrotic central left lung mass involving both the inferior aspect of the left upper lobe and the superior aspect of the left lower lobe. Findings consistent with a primary neoplasm. Recommend bronchoscopic biopsy. 2. Extensive left hilar adenopathy and borderline enlarged prevascular and subcarinal lymph nodes. 3. No worrisome pulmonary nodules to suggest pulmonary metastatic disease. 4. No findings for upper  abdominal metastatic disease or osseous metastatic disease. 5. Age related atherosclerotic calcifications involving the thoracic and abdominal aorta and branch vessels including the coronary arteries. 6. Aortic atherosclerosis.  CT abdomen pelvis with contrast showed no evidence of primary malignancy or metastatic disease in the abdomen/pelvis.  Enlarged prostate.  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  Chronic pancreatitis.      08/11/2022 Cancer Staging   Staging form: Lung, AJCC 8th Edition - Clinical stage from 08/11/2022: Stage IIIA (cT4, cN1, cM0) - Signed by Rickard Patience, MD on 08/11/2022 Stage prefix: Initial diagnosis   08/18/2022 Procedure   Medi port placed by Dr. Wyn Quaker.   09/01/2022 -  Radiation Therapy   Radiation to lung    09/03/2022 -  Chemotherapy   Patient is on Treatment Plan : LUNG NSCLC Afatinib     Metastasis to brain (HCC)  09/03/2022 -  Chemotherapy   Patient is on Treatment Plan : LUNG NSCLC Afatinib     09/03/2022 Initial Diagnosis   Metastasis to brain [facility])     ALLERGIES:  is allergic to ativan [lorazepam], benadryl [diphenhydramine], crestor [rosuvastatin calcium], nsaids, rapaflo [silodosin], statins, zetia [ezetimibe], latex, and neosporin [neomycin-bacitracin zn-polymyx].  MEDICATIONS:  Current Outpatient Medications  Medication Sig Dispense Refill   acetaminophen (TYLENOL) 325 MG tablet Take 650 mg by mouth every 6 (six) hours as needed.     afatinib dimaleate (GILOTRIF) 30 MG tablet Take 1 tablet (30 mg total) by mouth daily. Take on an empty stomach 1hr before or 2hrs after meals. 30 tablet 0   Apoaequorin (PREVAGEN PO) Take by mouth daily.     benzonatate (TESSALON) 200 MG capsule Take 1 capsule (200 mg total) by mouth 3 (three) times daily as needed for cough. 20 capsule 0   Calcium Citrate-Vitamin D (CITRACAL + D PO) Take by mouth 2 (two) times daily.     clindamycin (CLINDAGEL) 1 % gel Apply topically 2 (two) times daily. 30 g 1    cyanocobalamin (VITAMIN B12) 1000 MCG/ML injection Inject 1,000 mcg into the muscle every 30 (thirty) days.     donepezil (ARICEPT) 5 MG tablet Take 5 mg by mouth in the morning.     finasteride (PROSCAR) 5 MG tablet Take 1 tablet by mouth daily.     KRILL OIL PO Take by mouth daily.     lidocaine-prilocaine (EMLA) cream Apply to affected area once (Patient not taking: Reported on 09/02/2022) 30 g 3   loperamide (IMODIUM) 2 MG capsule Take 1 capsule (2 mg total) by mouth See admin instructions. Initial: 4 mg,the 2 mg every 2 hours (4 mg every 4 hours at night)  maximum: 16 mg/day 60 capsule 2   losartan (COZAAR) 50 MG tablet Take 50 mg by mouth daily.     ondansetron (ZOFRAN) 8 MG tablet Take 1 tablet (8 mg total) by mouth every 8 (eight) hours as needed for nausea or vomiting. Start on the third day after chemotherapy. (Patient not taking: Reported on 08/16/2022) 30  tablet 1   polyethylene glycol (MIRALAX / GLYCOLAX) 17 g packet Take 17 g by mouth daily as needed.     pramipexole (MIRAPEX) 0.5 MG tablet Take 0.5 mg by mouth at bedtime.     Probiotic Product (ALIGN PO) Take daily by mouth.     prochlorperazine (COMPAZINE) 10 MG tablet Take 1 tablet (10 mg total) by mouth every 6 (six) hours as needed for nausea or vomiting. 30 tablet 1   tamsulosin (FLOMAX) 0.4 MG CAPS capsule Take 0.4 mg by mouth daily.     Thiamine HCl (VITAMIN B1 PO) Take 1 tablet by mouth daily. (Patient not taking: Reported on 08/18/2022)     traMADol (ULTRAM) 50 MG tablet Take 50 mg every 6 (six) hours as needed by mouth.     triamcinolone cream (KENALOG) 0.1 % Apply 1 Application topically 2 (two) times daily. 30 g 1   No current facility-administered medications for this visit.    VITAL SIGNS: There were no vitals taken for this visit. There were no vitals filed for this visit.  Estimated body mass index is 23.22 kg/m as calculated from the following:   Height as of 08/05/22: 6' (1.829 m).   Weight as of an earlier  encounter on 09/20/22: 77.7 kg (171 lb 3.2 oz).  LABS: CBC:    Component Value Date/Time   WBC 10.0 09/20/2022 0934   WBC 7.7 08/04/2022 0429   HGB 11.0 (L) 09/20/2022 0934   HCT 33.4 (L) 09/20/2022 0934   PLT 342 09/20/2022 0934   MCV 90.0 09/20/2022 0934   NEUTROABS 8.6 (H) 09/20/2022 0934   LYMPHSABS 0.4 (L) 09/20/2022 0934   MONOABS 0.8 09/20/2022 0934   EOSABS 0.1 09/20/2022 0934   BASOSABS 0.1 09/20/2022 0934   Comprehensive Metabolic Panel:    Component Value Date/Time   NA 133 (L) 09/20/2022 0934   K 4.1 09/20/2022 0934   CL 103 09/20/2022 0934   CO2 24 09/20/2022 0934   BUN 14 09/20/2022 0934   CREATININE 0.58 (L) 09/20/2022 0934   GLUCOSE 110 (H) 09/20/2022 0934   CALCIUM 9.3 09/20/2022 0934   AST 15 09/20/2022 0934   ALT 18 09/20/2022 0934   ALKPHOS 87 09/20/2022 0934   BILITOT 0.5 09/20/2022 0934   PROT 6.6 09/20/2022 0934   ALBUMIN 3.4 (L) 09/20/2022 0934     Present during today's visit: patient and his wife  Assessment and Plan: Continue afatinib 30mg  daily Start using topical clindamycin and triamcinolone on  neck rash   Oral Chemotherapy Side Effect/Intolerance:  Rash: see above, patient presented with rash on the sides and back of his neck. Reports skin irritation from the rash. (See above management) Fatigue: increased since starting afatinib, will continue to monitor  Oral Chemotherapy Adherence: No missed doses reported No patient barriers to medication adherence identified.   New medications: None reported  Medication Access Issues: No issues, fills at [facility])  Patient expressed understanding and was in agreement with this plan. He also understands that He can call clinic at any time with any questions, concerns, or complaints.   Follow-up plan: RTC as scheduled  Thank you for allowing me to participate in the care of this very pleasant patient.   Time Total: 15 mins  Visit consisted of counseling and  education on dealing with issues of symptom management in the setting of serious and potentially life-threatening illness.Greater than 50%  of this time was spent counseling and coordinating care related to the above  assessment and plan.  Signed by: Remi Haggard, PharmD, BCPS, Nolon Bussing, CPP Hematology/Oncology Clinical Pharmacist Practitioner [place] [phone]  09/20/2022 4:37 PM

## 2022-09-21 ENCOUNTER — Ambulatory Visit: Admission: RE | Admit: 2022-09-21 | Payer: Medicare Other | Source: Ambulatory Visit

## 2022-09-21 ENCOUNTER — Ambulatory Visit
Admission: RE | Admit: 2022-09-21 | Discharge: 2022-09-21 | Disposition: A | Discharge status: Home / Self Care | Payer: Medicare Other | Source: Ambulatory Visit | Attending: Oncology | Admitting: Oncology

## 2022-09-21 ENCOUNTER — Other Ambulatory Visit: Payer: Self-pay

## 2022-09-21 DIAGNOSIS — Z510 Encounter for antineoplastic radiation therapy: Secondary | ICD-10-CM | POA: Diagnosis not present

## 2022-09-21 DIAGNOSIS — I110 Hypertensive heart disease with heart failure: Secondary | ICD-10-CM | POA: Diagnosis not present

## 2022-09-21 DIAGNOSIS — I5020 Unspecified systolic (congestive) heart failure: Secondary | ICD-10-CM

## 2022-09-21 DIAGNOSIS — I080 Rheumatic disorders of both mitral and aortic valves: Secondary | ICD-10-CM | POA: Diagnosis not present

## 2022-09-21 DIAGNOSIS — I5031 Acute diastolic (congestive) heart failure: Secondary | ICD-10-CM | POA: Diagnosis present

## 2022-09-21 LAB — RAD ONC ARIA SESSION SUMMARY
Course Elapsed Days: 20
Plan Fractions Treated to Date: 15
Plan Prescribed Dose Per Fraction: 2 Gy
Plan Total Fractions Prescribed: 35
Plan Total Prescribed Dose: 70 Gy
Reference Point Dosage Given to Date: 30 Gy
Reference Point Session Dosage Given: 2 Gy
Session Number: 15

## 2022-09-21 LAB — ECHOCARDIOGRAM COMPLETE
AR max vel: 2.79 cm2
AV Area VTI: 2.88 cm2
AV Area mean vel: 2.81 cm2
AV Mean grad: 5.5 mmHg
AV Peak grad: 10 mmHg
Ao pk vel: 1.59 m/s
Area-P 1/2: 3.15 cm2
MV VTI: 3.64 cm2
S' Lateral: 2.7 cm

## 2022-09-21 NOTE — Progress Notes (Signed)
*  PRELIMINARY RESULTS* Echocardiogram 2D Echocardiogram has been performed.  Cristela Blue 09/21/2022, 11:39 AM

## 2022-09-22 ENCOUNTER — Inpatient Hospital Stay: Payer: Medicare Other | Attending: Oncology

## 2022-09-22 ENCOUNTER — Ambulatory Visit
Admission: RE | Admit: 2022-09-22 | Discharge: 2022-09-22 | Disposition: A | Discharge status: Home / Self Care | Payer: Medicare Other | Source: Ambulatory Visit | Attending: Radiation Oncology | Admitting: Radiation Oncology

## 2022-09-22 ENCOUNTER — Other Ambulatory Visit: Payer: Self-pay

## 2022-09-22 DIAGNOSIS — D649 Anemia, unspecified: Secondary | ICD-10-CM | POA: Insufficient documentation

## 2022-09-22 DIAGNOSIS — R21 Rash and other nonspecific skin eruption: Secondary | ICD-10-CM | POA: Insufficient documentation

## 2022-09-22 DIAGNOSIS — I509 Heart failure, unspecified: Secondary | ICD-10-CM | POA: Insufficient documentation

## 2022-09-22 DIAGNOSIS — C7931 Secondary malignant neoplasm of brain: Secondary | ICD-10-CM | POA: Insufficient documentation

## 2022-09-22 DIAGNOSIS — R590 Localized enlarged lymph nodes: Secondary | ICD-10-CM | POA: Insufficient documentation

## 2022-09-22 DIAGNOSIS — Z7982 Long term (current) use of aspirin: Secondary | ICD-10-CM | POA: Insufficient documentation

## 2022-09-22 DIAGNOSIS — Z79899 Other long term (current) drug therapy: Secondary | ICD-10-CM | POA: Insufficient documentation

## 2022-09-22 DIAGNOSIS — F1010 Alcohol abuse, uncomplicated: Secondary | ICD-10-CM | POA: Insufficient documentation

## 2022-09-22 DIAGNOSIS — Z85820 Personal history of malignant melanoma of skin: Secondary | ICD-10-CM | POA: Insufficient documentation

## 2022-09-22 DIAGNOSIS — G4730 Sleep apnea, unspecified: Secondary | ICD-10-CM | POA: Insufficient documentation

## 2022-09-22 DIAGNOSIS — Z85828 Personal history of other malignant neoplasm of skin: Secondary | ICD-10-CM | POA: Diagnosis not present

## 2022-09-22 DIAGNOSIS — I4891 Unspecified atrial fibrillation: Secondary | ICD-10-CM | POA: Insufficient documentation

## 2022-09-22 DIAGNOSIS — L570 Actinic keratosis: Secondary | ICD-10-CM | POA: Insufficient documentation

## 2022-09-22 DIAGNOSIS — Z792 Long term (current) use of antibiotics: Secondary | ICD-10-CM | POA: Insufficient documentation

## 2022-09-22 DIAGNOSIS — Z510 Encounter for antineoplastic radiation therapy: Secondary | ICD-10-CM | POA: Diagnosis present

## 2022-09-22 DIAGNOSIS — C3492 Malignant neoplasm of unspecified part of left bronchus or lung: Secondary | ICD-10-CM | POA: Insufficient documentation

## 2022-09-22 DIAGNOSIS — Z8041 Family history of malignant neoplasm of ovary: Secondary | ICD-10-CM | POA: Insufficient documentation

## 2022-09-22 DIAGNOSIS — I10 Essential (primary) hypertension: Secondary | ICD-10-CM | POA: Insufficient documentation

## 2022-09-22 DIAGNOSIS — Z87891 Personal history of nicotine dependence: Secondary | ICD-10-CM | POA: Diagnosis not present

## 2022-09-22 DIAGNOSIS — C3432 Malignant neoplasm of lower lobe, left bronchus or lung: Secondary | ICD-10-CM | POA: Insufficient documentation

## 2022-09-22 DIAGNOSIS — E538 Deficiency of other specified B group vitamins: Secondary | ICD-10-CM | POA: Insufficient documentation

## 2022-09-22 LAB — RAD ONC ARIA SESSION SUMMARY
Course Elapsed Days: 21
Plan Fractions Treated to Date: 16
Plan Prescribed Dose Per Fraction: 2 Gy
Plan Total Fractions Prescribed: 35
Plan Total Prescribed Dose: 70 Gy
Reference Point Dosage Given to Date: 32 Gy
Reference Point Session Dosage Given: 2 Gy
Session Number: 16

## 2022-09-22 NOTE — Progress Notes (Signed)
Nutrition Assessment   Reason for Assessment:  Decreased appetite, pt requested   ASSESSMENT:  82 year old male with SCC of lung, stage IV.  Past medical history of HTN, CHF, Vit B 12 deficiency, cardiomyopathy, fibrosarcoma of right upper extremity.  Receiving radiation and taking afatinib.  Met with patient and wife in clinic.  Reports that appetite is decreased for the last 6 months. Wife has noted decreased intake for the last 1-2 weeks.  Has been snacking more.  Over the last couple of days noticed some discomfort when swallowing foods (radiation esophagitis).  This morning was able to drink premier protein shake and eat banana.  Has also had some potato chips and root beer before coming to the cancer center.  Dinner last night was chicken casserole (chicken, rice, almonds/chestnuts) and mashed potatoes.  Likes to snack on Kind bars, peanut butter.    Denies diarrhea or nausea.     Nutrition Focused Physical Exam:   Orbital Region: mild Buccal Region: mild Upper Arm Region: mild Thoracic and Lumbar Region: normal Temple Region: mild Clavicle Bone Region: normal Shoulder and Acromion Bone Region: normal Scapular Bone Region: normal Dorsal Hand: normal Patellar Region: mild Anterior Thigh Region: mild Posterior Calf Region: normal Edema (RD assessment): none    Medications: Vit B 12, imodium, compazine, zofran, miralax, calcium/Vit D   Labs: Na 133, glucose 110   Anthropometrics:   Height: 72 inches Weight: 171 lb 3.2 oz 178 lb on 01/25/22 BMI: 23  4% weight loss in the last 8 months   Estimated Energy Needs  Kcals: 1950-2300 Protein: 98-115 g Fluid: 1950-2300 ml   NUTRITION DIAGNOSIS: Unintentional weight loss related to cancer and related treatment as evidenced by 4% weight loss in the last 8 months and decreased intake    INTERVENTION:  Discussed importance of good nutrition and maintaining weight Discussed soft, moist protein foods for ease of  swallowing. Handout provided Encouraged patient to continue drinking premier protein shakes. Can increase to 2 a day.  If weight continues to drop would recommend switching to a 350 calorie shake. Questions answered regarding foods allowed to eat.  Encouraged high calorie and high protein foods to help maintain weight.  Contact information provided   MONITORING, EVALUATION, GOAL: weight trends, intake   Next Visit: Thursday, August 22 after radaition  Dinara Lupu B. Freida Busman, RD, LDN Registered Dietitian [phone]

## 2022-09-23 ENCOUNTER — Other Ambulatory Visit: Payer: Self-pay

## 2022-09-23 ENCOUNTER — Ambulatory Visit: Admission: RE | Admit: 2022-09-23 | Payer: Medicare Other | Source: Ambulatory Visit

## 2022-09-23 DIAGNOSIS — Z510 Encounter for antineoplastic radiation therapy: Secondary | ICD-10-CM | POA: Diagnosis not present

## 2022-09-23 LAB — RAD ONC ARIA SESSION SUMMARY
Course Elapsed Days: 22
Plan Fractions Treated to Date: 17
Plan Prescribed Dose Per Fraction: 2 Gy
Plan Total Fractions Prescribed: 35
Plan Total Prescribed Dose: 70 Gy
Reference Point Dosage Given to Date: 34 Gy
Reference Point Session Dosage Given: 2 Gy
Session Number: 17

## 2022-09-26 ENCOUNTER — Ambulatory Visit
Admission: RE | Admit: 2022-09-26 | Discharge: 2022-09-26 | Disposition: A | Discharge status: Home / Self Care | Payer: Medicare Other | Source: Ambulatory Visit | Attending: Radiation Oncology | Admitting: Radiation Oncology

## 2022-09-26 ENCOUNTER — Other Ambulatory Visit: Payer: Self-pay

## 2022-09-26 DIAGNOSIS — Z510 Encounter for antineoplastic radiation therapy: Secondary | ICD-10-CM | POA: Diagnosis not present

## 2022-09-26 LAB — RAD ONC ARIA SESSION SUMMARY
Course Elapsed Days: 25
Plan Fractions Treated to Date: 18
Plan Prescribed Dose Per Fraction: 2 Gy
Plan Total Fractions Prescribed: 35
Plan Total Prescribed Dose: 70 Gy
Reference Point Dosage Given to Date: 36 Gy
Reference Point Session Dosage Given: 2 Gy
Session Number: 18

## 2022-09-27 ENCOUNTER — Other Ambulatory Visit: Payer: Self-pay

## 2022-09-27 ENCOUNTER — Ambulatory Visit
Admission: RE | Admit: 2022-09-27 | Discharge: 2022-09-27 | Disposition: A | Discharge status: Home / Self Care | Payer: Medicare Other | Source: Ambulatory Visit | Attending: Radiation Oncology | Admitting: Radiation Oncology

## 2022-09-27 DIAGNOSIS — Z510 Encounter for antineoplastic radiation therapy: Secondary | ICD-10-CM | POA: Diagnosis not present

## 2022-09-27 LAB — RAD ONC ARIA SESSION SUMMARY
Course Elapsed Days: 26
Plan Fractions Treated to Date: 19
Plan Prescribed Dose Per Fraction: 2 Gy
Plan Total Fractions Prescribed: 35
Plan Total Prescribed Dose: 70 Gy
Reference Point Dosage Given to Date: 38 Gy
Reference Point Session Dosage Given: 2 Gy
Session Number: 19

## 2022-09-28 ENCOUNTER — Other Ambulatory Visit: Payer: Self-pay

## 2022-09-28 ENCOUNTER — Encounter: Payer: Self-pay | Admitting: Oncology

## 2022-09-28 ENCOUNTER — Ambulatory Visit
Admission: RE | Admit: 2022-09-28 | Discharge: 2022-09-28 | Disposition: A | Discharge status: Home / Self Care | Payer: Medicare Other | Source: Ambulatory Visit | Attending: Radiation Oncology | Admitting: Radiation Oncology

## 2022-09-28 DIAGNOSIS — Z510 Encounter for antineoplastic radiation therapy: Secondary | ICD-10-CM | POA: Diagnosis not present

## 2022-09-28 LAB — RAD ONC ARIA SESSION SUMMARY
Course Elapsed Days: 27
Plan Fractions Treated to Date: 20
Plan Prescribed Dose Per Fraction: 2 Gy
Plan Total Fractions Prescribed: 35
Plan Total Prescribed Dose: 70 Gy
Reference Point Dosage Given to Date: 40 Gy
Reference Point Session Dosage Given: 2 Gy
Session Number: 20

## 2022-09-29 ENCOUNTER — Other Ambulatory Visit: Payer: Self-pay | Admitting: Oncology

## 2022-09-29 ENCOUNTER — Other Ambulatory Visit: Payer: Self-pay

## 2022-09-29 ENCOUNTER — Other Ambulatory Visit (HOSPITAL_COMMUNITY): Payer: Self-pay

## 2022-09-29 ENCOUNTER — Ambulatory Visit
Admission: RE | Admit: 2022-09-29 | Discharge: 2022-09-29 | Disposition: A | Discharge status: Home / Self Care | Payer: Medicare Other | Source: Ambulatory Visit | Attending: Radiation Oncology | Admitting: Radiation Oncology

## 2022-09-29 DIAGNOSIS — C3492 Malignant neoplasm of unspecified part of left bronchus or lung: Secondary | ICD-10-CM

## 2022-09-29 DIAGNOSIS — C7931 Secondary malignant neoplasm of brain: Secondary | ICD-10-CM

## 2022-09-29 DIAGNOSIS — Z510 Encounter for antineoplastic radiation therapy: Secondary | ICD-10-CM | POA: Diagnosis not present

## 2022-09-29 LAB — RAD ONC ARIA SESSION SUMMARY
Course Elapsed Days: 28
Plan Fractions Treated to Date: 21
Plan Prescribed Dose Per Fraction: 2 Gy
Plan Total Fractions Prescribed: 35
Plan Total Prescribed Dose: 70 Gy
Reference Point Dosage Given to Date: 42 Gy
Reference Point Session Dosage Given: 2 Gy
Session Number: 21

## 2022-09-29 MED ORDER — AFATINIB DIMALEATE 30 MG PO TABS
30.0000 mg | ORAL_TABLET | Freq: Every day | ORAL | 0 refills | Status: DC
Start: 2022-09-29 — End: 2022-10-06
  Filled 2022-09-29: qty 30, 30d supply, fill #0

## 2022-09-30 ENCOUNTER — Other Ambulatory Visit (HOSPITAL_COMMUNITY): Payer: Self-pay

## 2022-09-30 ENCOUNTER — Ambulatory Visit
Admission: RE | Admit: 2022-09-30 | Discharge: 2022-09-30 | Disposition: A | Discharge status: Home / Self Care | Payer: Medicare Other | Source: Ambulatory Visit | Attending: Radiation Oncology | Admitting: Radiation Oncology

## 2022-09-30 ENCOUNTER — Other Ambulatory Visit: Payer: Self-pay

## 2022-09-30 DIAGNOSIS — Z510 Encounter for antineoplastic radiation therapy: Secondary | ICD-10-CM | POA: Diagnosis not present

## 2022-09-30 LAB — RAD ONC ARIA SESSION SUMMARY
Course Elapsed Days: 29
Plan Fractions Treated to Date: 22
Plan Prescribed Dose Per Fraction: 2 Gy
Plan Total Fractions Prescribed: 35
Plan Total Prescribed Dose: 70 Gy
Reference Point Dosage Given to Date: 44 Gy
Reference Point Session Dosage Given: 2 Gy
Session Number: 22

## 2022-10-03 ENCOUNTER — Other Ambulatory Visit (HOSPITAL_COMMUNITY): Payer: Self-pay

## 2022-10-03 ENCOUNTER — Other Ambulatory Visit: Payer: Self-pay

## 2022-10-03 ENCOUNTER — Ambulatory Visit
Admission: RE | Admit: 2022-10-03 | Discharge: 2022-10-03 | Disposition: A | Discharge status: Home / Self Care | Payer: Medicare Other | Source: Ambulatory Visit | Attending: Radiation Oncology | Admitting: Radiation Oncology

## 2022-10-03 DIAGNOSIS — Z510 Encounter for antineoplastic radiation therapy: Secondary | ICD-10-CM | POA: Diagnosis not present

## 2022-10-03 LAB — RAD ONC ARIA SESSION SUMMARY
Course Elapsed Days: 32
Plan Fractions Treated to Date: 23
Plan Prescribed Dose Per Fraction: 2 Gy
Plan Total Fractions Prescribed: 35
Plan Total Prescribed Dose: 70 Gy
Reference Point Dosage Given to Date: 46 Gy
Reference Point Session Dosage Given: 2 Gy
Session Number: 23

## 2022-10-04 ENCOUNTER — Other Ambulatory Visit: Payer: Self-pay

## 2022-10-04 ENCOUNTER — Ambulatory Visit
Admission: RE | Admit: 2022-10-04 | Discharge: 2022-10-04 | Disposition: A | Discharge status: Home / Self Care | Payer: Medicare Other | Source: Ambulatory Visit | Attending: Radiation Oncology | Admitting: Radiation Oncology

## 2022-10-04 DIAGNOSIS — Z510 Encounter for antineoplastic radiation therapy: Secondary | ICD-10-CM | POA: Diagnosis not present

## 2022-10-04 LAB — RAD ONC ARIA SESSION SUMMARY
Course Elapsed Days: 33
Plan Fractions Treated to Date: 24
Plan Prescribed Dose Per Fraction: 2 Gy
Plan Total Fractions Prescribed: 35
Plan Total Prescribed Dose: 70 Gy
Reference Point Dosage Given to Date: 48 Gy
Reference Point Session Dosage Given: 2 Gy
Session Number: 24

## 2022-10-05 ENCOUNTER — Ambulatory Visit
Admission: RE | Admit: 2022-10-05 | Discharge: 2022-10-05 | Disposition: A | Discharge status: Home / Self Care | Payer: Medicare Other | Source: Ambulatory Visit | Attending: Radiation Oncology | Admitting: Radiation Oncology

## 2022-10-05 ENCOUNTER — Other Ambulatory Visit: Payer: Self-pay

## 2022-10-05 DIAGNOSIS — Z510 Encounter for antineoplastic radiation therapy: Secondary | ICD-10-CM | POA: Diagnosis not present

## 2022-10-05 LAB — RAD ONC ARIA SESSION SUMMARY
Course Elapsed Days: 34
Plan Fractions Treated to Date: 25
Plan Prescribed Dose Per Fraction: 2 Gy
Plan Total Fractions Prescribed: 35
Plan Total Prescribed Dose: 70 Gy
Reference Point Dosage Given to Date: 50 Gy
Reference Point Session Dosage Given: 2 Gy
Session Number: 25

## 2022-10-06 ENCOUNTER — Encounter: Payer: Self-pay | Admitting: Oncology

## 2022-10-06 ENCOUNTER — Inpatient Hospital Stay: Payer: Medicare Other

## 2022-10-06 ENCOUNTER — Other Ambulatory Visit: Payer: Self-pay

## 2022-10-06 ENCOUNTER — Inpatient Hospital Stay (HOSPITAL_BASED_OUTPATIENT_CLINIC_OR_DEPARTMENT_OTHER): Payer: Medicare Other | Admitting: Oncology

## 2022-10-06 ENCOUNTER — Other Ambulatory Visit (HOSPITAL_COMMUNITY): Payer: Self-pay

## 2022-10-06 ENCOUNTER — Ambulatory Visit
Admission: RE | Admit: 2022-10-06 | Discharge: 2022-10-06 | Disposition: A | Discharge status: Home / Self Care | Payer: Medicare Other | Source: Ambulatory Visit | Attending: Radiation Oncology | Admitting: Radiation Oncology

## 2022-10-06 VITALS — BP 115/56 | HR 96 | Temp 97.7°F | Resp 18 | Wt 170.2 lb

## 2022-10-06 DIAGNOSIS — D649 Anemia, unspecified: Secondary | ICD-10-CM

## 2022-10-06 DIAGNOSIS — L708 Other acne: Secondary | ICD-10-CM

## 2022-10-06 DIAGNOSIS — Z95828 Presence of other vascular implants and grafts: Secondary | ICD-10-CM

## 2022-10-06 DIAGNOSIS — C7931 Secondary malignant neoplasm of brain: Secondary | ICD-10-CM | POA: Diagnosis not present

## 2022-10-06 DIAGNOSIS — C3492 Malignant neoplasm of unspecified part of left bronchus or lung: Secondary | ICD-10-CM

## 2022-10-06 DIAGNOSIS — Z5111 Encounter for antineoplastic chemotherapy: Secondary | ICD-10-CM | POA: Diagnosis not present

## 2022-10-06 DIAGNOSIS — Z510 Encounter for antineoplastic radiation therapy: Secondary | ICD-10-CM | POA: Diagnosis not present

## 2022-10-06 LAB — FOLATE: Folate: 23 ng/mL (ref >5.9)

## 2022-10-06 LAB — CMP (CANCER CENTER ONLY)
ALT: 22 U/L (ref 0–44)
AST: 19 U/L (ref 15–41)
Albumin: 3.3 g/dL — ABNORMAL LOW (ref 3.5–5.0)
Alkaline Phosphatase: 87 U/L (ref 38–126)
Anion gap: 6 (ref 5–15)
BUN: 14 mg/dL (ref 8–23)
CO2: 25 mmol/L (ref 22–32)
Calcium: 9.3 mg/dL (ref 8.9–10.3)
Chloride: 103 mmol/L (ref 98–111)
Creatinine: 0.69 mg/dL (ref 0.61–1.24)
GFR, Estimated: >60 mL/min (ref >60)
Glucose, Bld: 103 mg/dL — ABNORMAL HIGH (ref 70–99)
Potassium: 4 mmol/L (ref 3.5–5.1)
Sodium: 134 mmol/L — ABNORMAL LOW (ref 135–145)
Total Bilirubin: 0.5 mg/dL (ref 0.3–1.2)
Total Protein: 6.3 g/dL — ABNORMAL LOW (ref 6.5–8.1)

## 2022-10-06 LAB — FERRITIN: Ferritin: 469 ng/mL — ABNORMAL HIGH (ref 24–336)

## 2022-10-06 LAB — RAD ONC ARIA SESSION SUMMARY
Course Elapsed Days: 35
Plan Fractions Treated to Date: 26
Plan Prescribed Dose Per Fraction: 2 Gy
Plan Total Fractions Prescribed: 35
Plan Total Prescribed Dose: 70 Gy
Reference Point Dosage Given to Date: 52 Gy
Reference Point Session Dosage Given: 2 Gy
Session Number: 26

## 2022-10-06 LAB — IRON AND TIBC
Iron: 29 ug/dL — ABNORMAL LOW (ref 45–182)
Saturation Ratios: 12 % — ABNORMAL LOW (ref 17.9–39.5)
TIBC: 234 ug/dL — ABNORMAL LOW (ref 250–450)
UIBC: 205 ug/dL

## 2022-10-06 LAB — CBC WITH DIFFERENTIAL (CANCER CENTER ONLY)
Abs Immature Granulocytes: 0.02 10*3/uL (ref 0.00–0.07)
Basophils Absolute: 0 10*3/uL (ref 0.0–0.1)
Basophils Relative: 0 %
Eosinophils Absolute: 0.2 10*3/uL (ref 0.0–0.5)
Eosinophils Relative: 2 %
HCT: 30.2 % — ABNORMAL LOW (ref 39.0–52.0)
Hemoglobin: 9.9 g/dL — ABNORMAL LOW (ref 13.0–17.0)
Immature Granulocytes: 0 %
Lymphocytes Relative: 3 %
Lymphs Abs: 0.2 10*3/uL — ABNORMAL LOW (ref 0.7–4.0)
MCH: 29.6 pg (ref 26.0–34.0)
MCHC: 32.8 g/dL (ref 30.0–36.0)
MCV: 90.4 fL (ref 80.0–100.0)
Monocytes Absolute: 0.8 10*3/uL (ref 0.1–1.0)
Monocytes Relative: 11 %
Neutro Abs: 6.3 10*3/uL (ref 1.7–7.7)
Neutrophils Relative %: 84 %
Platelet Count: 283 10*3/uL (ref 150–400)
RBC: 3.34 MIL/uL — ABNORMAL LOW (ref 4.22–5.81)
RDW: 13 % (ref 11.5–15.5)
WBC Count: 7.5 10*3/uL (ref 4.0–10.5)
nRBC: 0 % (ref 0.0–0.2)

## 2022-10-06 LAB — RETIC PANEL
Immature Retic Fract: 10.8 % (ref 2.3–15.9)
RBC.: 3.34 MIL/uL — ABNORMAL LOW (ref 4.22–5.81)
Retic Count, Absolute: 51.4 10*3/uL (ref 19.0–186.0)
Retic Ct Pct: 1.5 % (ref 0.4–3.1)
Reticulocyte Hemoglobin: 30 pg (ref >27.9)

## 2022-10-06 MED ORDER — HEPARIN SOD (PORK) LOCK FLUSH 100 UNIT/ML IV SOLN
500.0000 [IU] | Freq: Once | INTRAVENOUS | Status: AC
  Administered 2022-10-06: 500 [IU] via INTRAVENOUS
  Filled 2022-10-06: qty 5

## 2022-10-06 MED ORDER — TRIAMCINOLONE ACETONIDE 0.1 % EX CREA: 1.0000 | TOPICAL_CREAM | Freq: Two times a day (BID) | CUTANEOUS | 6 refills | Status: DC

## 2022-10-06 MED ORDER — AFATINIB DIMALEATE 30 MG PO TABS
30.0000 mg | ORAL_TABLET | Freq: Every day | ORAL | 0 refills | Status: DC
  Filled 2022-10-06 – 2022-10-26 (×2): qty 30, 30d supply, fill #0

## 2022-10-06 MED ORDER — CLINDAMYCIN PHOSPHATE 1 % EX GEL: Freq: Two times a day (BID) | CUTANEOUS | 6 refills | Status: DC

## 2022-10-06 MED ORDER — SODIUM CHLORIDE 0.9% FLUSH
10.0000 mL | Freq: Once | INTRAVENOUS | Status: AC
  Filled 2022-10-06: qty 10

## 2022-10-06 NOTE — Assessment & Plan Note (Signed)
 Recommend port flush every 6-8 weeks if not actively using Mediport

## 2022-10-06 NOTE — Assessment & Plan Note (Signed)
Likely due to radiation.  Monitor levels.

## 2022-10-06 NOTE — Assessment & Plan Note (Signed)
 Afatinib and Osimertinib both can cross blood brain barrier.  I recommend to hold off brain radiation and close monitor with MRI brain.  Repeat MRI in 2 to 3 months.

## 2022-10-06 NOTE — Progress Notes (Signed)
Hematology/Oncology Progress note [phone] [phone]        REFERRING PROVIDER: Lauro Regulus, MD    CHIEF COMPLAINTS/PURPOSE OF CONSULTATION:  Squamous cell lung cancer   ASSESSMENT & PLAN:   Cancer Staging  Squamous cell carcinoma lung (HCC) Staging form: Lung, AJCC 8th Edition - Clinical stage from 08/11/2022: Stage IIIA (cT4, cN1, cM0) - Signed by Rickard Patience, MD on 08/11/2022   Squamous cell carcinoma lung (HCC) MRI brain result was discussed with patient. Small brain metastatic disease.  stage IV squamous cell carcinoma Labs reviewed and discussed with patient He tolerates Afatinib 30 mg daily.  Continue. Continue follow-up with radiation oncology for radiation.  Metastasis to brain [facility]) Afatinib and Osimertinib both can cross blood brain barrier.  I recommend to hold off brain radiation and close monitor with MRI brain.  Repeat MRI in 2 to 3 months.  Acneiform rash Likely secondary to Afatinib Recommend patient to apply topical steroid cream as well as clindamycin gel  Encounter for antineoplastic chemotherapy Chemotherapy plan as listed above.  Port-A-Cath in place Recommend port flush every 6-8 weeks if not actively using Mediport  Normocytic anemia Likely due to radiation.  Monitor levels.    Orders Placed This Encounter  Procedures   Ferritin    Standing Status:   Future    Number of Occurrences:   1    Standing Expiration Date:   10/06/2023   Iron and TIBC    Standing Status:   Future    Number of Occurrences:   1    Standing Expiration Date:   10/06/2023   Folate    Standing Status:   Future    Number of Occurrences:   1    Standing Expiration Date:   10/06/2023   Retic Panel    Standing Status:   Future    Number of Occurrences:   1    Standing Expiration Date:   10/06/2023   CMP (Cancer Center only)    Standing Status:   Future    Standing Expiration Date:   10/06/2023   CBC with Differential (Cancer Center  Only)    Standing Status:   Future    Standing Expiration Date:   10/06/2023   Ferritin    Standing Status:   Future    Number of Occurrences:   1    Standing Expiration Date:   10/06/2023   Iron and TIBC    Standing Status:   Future    Number of Occurrences:   1    Standing Expiration Date:   10/06/2023   Folate    Standing Status:   Future    Number of Occurrences:   1    Standing Expiration Date:   10/06/2023   Retic Panel    Standing Status:   Future    Number of Occurrences:   1    Standing Expiration Date:   10/06/2023   Follow-up 2 to 3 weeks To start chemotherapy when patient starts radiation. All questions were answered. The patient knows to call the clinic with any problems, questions or concerns.  Rickard Patience, MD, PhD [facility] Hematology Oncology 10/06/2022    HISTORY OF PRESENTING ILLNESS:  Wesley Young 82 y.o. 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  I have reviewed his chart and materials related to his cancer extensively and collaborated history with the patient. Summary of oncologic history is as follows: Oncology History  Squamous  cell carcinoma lung (HCC)  08/02/2022 Initial Diagnosis   Squamous cell carcinoma lung (HCC)  07/18/2022, patient developed hemoptysis nitially very small amount. Stopped for 3 days and again patient experienced hemoptysis which prompted him to go to emergency room on 08/02/2022 for further evaluation. CT findings showed 7.1 x 6.0 x 4.4 cm partially necrotic central left lung mass, extensive left hilar adenopathy and borderline enlarged prevascular and subcarinal lymph nodes.  08/05/2022 patient underwent biopsy via bronchoscopy by Dr. Karna Christmas. Left lower lobe ENB assisted biopsy showed squamous cell carcinoma. Left lower lobe lavage-suspicious for malignancy Station 10 R lymph node FNA negative for malignancy Left lower lobe bronchoscopy with brushing-positive for  malignancy-non-small cell carcinoma Left lower lobe bronchoscopy with FNA positive for malignancy-non-small cell carcinoma Station 7 lymph node FNA-negative Station 10 L lymph node FNA suspicious for malignancy     08/02/2022 Imaging   CT chest with contrast showed 1. 7.1 x 6.0 x 4.4 cm partially necrotic central left lung mass involving both the inferior aspect of the left upper lobe and the superior aspect of the left lower lobe. Findings consistent with a primary neoplasm. Recommend bronchoscopic biopsy. 2. Extensive left hilar adenopathy and borderline enlarged prevascular and subcarinal lymph nodes. 3. No worrisome pulmonary nodules to suggest pulmonary metastatic disease. 4. No findings for upper abdominal metastatic disease or osseous metastatic disease. 5. Age related atherosclerotic calcifications involving the thoracic and abdominal aorta and branch vessels including the coronary arteries. 6. Aortic atherosclerosis.  CT abdomen pelvis with contrast showed no evidence of primary malignancy or metastatic disease in the abdomen/pelvis.  Enlarged prostate.  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  Chronic pancreatitis.      08/11/2022 Cancer Staging   Staging form: Lung, AJCC 8th Edition - Clinical stage from 08/11/2022: Stage IIIA (cT4, cN1, cM0) - Signed by Rickard Patience, MD on 08/11/2022 Stage prefix: Initial diagnosis   08/18/2022 Procedure   Medi port placed by Dr. Wyn Quaker.   09/01/2022 -  Radiation Therapy   Radiation to lung    09/03/2022 -  Chemotherapy   Patient is on Treatment Plan : LUNG NSCLC Afatinib     09/07/2022 -  Chemotherapy   Started afatinib 30 mg daily.   Metastasis to brain [facility])  09/03/2022 -  Chemotherapy   Patient is on Treatment Plan : LUNG NSCLC Afatinib     09/03/2022 Initial Diagnosis   Metastasis to brain [facility])    Patient is a former smoker.  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  Patient had a history of  mustard gas exposure of right extremity during service.  In 1977, patient received cobalt radiation and surgery and since then has remained in remission. 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  He has history of uncontrolled bowel and family history of lumbar radiculopathy.  Longstanding general severe sensorimotor peripheral neuropathy.  He walks with a walker at baseline.  [name] is a 82 y.o. male who has above history reviewed by me today presents for follow up visit for Stage IV squamous cell carcinoma.  Patient has tolerated afatinib 30 mg well.  Denies any nausea vomiting diarrhea. He is getting lung radiation. + cough.  Symptom has improved. + Acneform rash, mostly around his neck, some on his face and behind his ears,improved with topical steroid  and clindamycin gel.   MEDICAL HISTORY:  Past Medical History:  Diagnosis Date   Abnormal PSA    s/p post prostate biopsy in the past which was negative   Actinic keratosis 03/14/2007   L ant lat neck at base of neck - bx proven    Actinic keratosis 01/08/2014   R calf - bx proven    Alcoholism (HCC)    with prolonged hospitalization 2009 for withdrawal c/b aspiration pneumonia requiring vent   Anxiety    Atrial fibrillation and flutter (HCC)    pt denies afib.   B12 deficiency    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/2015   L upper back paraspinal - excision    Hx of squamous cell carcinoma of skin 2014   multiple sites   Hypertension    Impotence    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HCC)    Sleep apnea    Spinal stenosis    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   L forearm - excision    Squamous cell carcinoma of skin 12/16/2015   R dorsum hand    Squamous cell carcinoma of skin 10/02/2017   R dorsum hand    Varicose veins of both lower extremities     SURGICAL HISTORY: Past Surgical History:  Procedure Laterality Date   CATARACT EXTRACTION W/PHACO Left 03/12/2019   Procedure: CATARACT EXTRACTION PHACO AND INTRAOCULAR LENS PLACEMENT (IOC) LEFT 5.27 00:38.6;  Surgeon: Galen Manila, MD;  Location: [facility];  Service: Ophthalmology;  Laterality: Left;  sleep apnea   CATARACT EXTRACTION W/PHACO Right 04/09/2019   Procedure: CATARACT EXTRACTION PHACO AND INTRAOCULAR LENS PLACEMENT (IOC) RIGHT 4.84 00:52.4;  Surgeon: Galen Manila, MD;  Location: [facility];  Service: Ophthalmology;  Laterality: Right;   colonoscopy with polypectomy  12/2016   COLONOSCOPY WITH PROPOFOL N/A 12/27/2016   Procedure: COLONOSCOPY WITH PROPOFOL;  Surgeon: Toledo, Boykin Nearing, MD;  Location: [facility];  Service: Endoscopy;  Laterality: N/A;   COLONOSCOPY WITH PROPOFOL N/A 03/23/2020   Procedure: COLONOSCOPY WITH PROPOFOL;  Surgeon: Toledo, Boykin Nearing, MD;  Location: [facility] ENDOSCOPY;  Service: Gastroenterology;  Laterality: N/A;   ESOPHAGOGASTRODUODENOSCOPY (EGD) WITH PROPOFOL N/A 09/21/2017   Procedure: ESOPHAGOGASTRODUODENOSCOPY (EGD) WITH PROPOFOL;  Surgeon:  Christena Deem, MD;  Location: [facility] END[facility]COPY;  Service: Endoscopy;  Laterality: N/A;   ESOPHAGOGASTRODUODENOSCOPY (EGD) WITH PROPOFOL N/A 03/23/2020   Procedure: ESOPHAGOGASTRODUODENOSCOPY (EGD) WITH PROPOFOL;  Surgeon: Toledo, Boykin Nearing, MD;  Location: [facility] ENDOSCOPY;  Service: Gastroenterology;  Laterality: N/A;   melonoma removal     MOHS SURGERY     on top  of head   PORTA CATH INSERTION N/A 08/18/2022   Procedure: PORTA CATH INSERTION;  Surgeon: Annice Needy, MD;  Location: [facility]AS[facility];  Service: Cardiovascular;  Laterality: N/A;   prostat biopsy x2     right deltoid resection in 1977     s/p resection of his right deltoid muscle in 1977     TONSILLECTOMY     TONSILLECTOMY AND ADENOIDECTOMY     VIDEO BRONCHOSCOPY WITH ENDOBRONCHIAL ULTRASOUND N/A 08/05/2022   Procedure: VIDEO BRONCHOSCOPY WITH ENDOBRONCHIAL ULTRASOUND;  Surgeon: Vida Rigger, MD;  Location: [facility] ORS;  Service: Thoracic;  Laterality: N/A;    SOCIAL HISTORY: Social History   Socioeconomic History   Marital status: Married    Spouse name: Not on file   Number of children: Not on file   Years of education: Not on file   Highest education level: Not on file  Occupational History   Not on file  Tobacco Use   Smoking status: Former    Current packs/day: 0.00    Types: Pipe, Cigarettes    Start date: 7    Quit date: 1977    Years since quitting: 47.6   Smokeless tobacco: Never  Vaping Use   Vaping status: Never Used  Substance and Sexual Activity   Alcohol use: Not Currently    Comment: quit 03/26/2008   Drug use: Not Currently   Sexual activity: Not Currently  Other Topics Concern   Not on file  Social History Narrative   Not on file   Social Determinants of Health   Financial Resource Strain: Low Risk  (08/10/2022)   Received from [facility], [facility]   Overall Financial Resource Strain (CARDIA)    Difficulty of Paying Living Expenses: Not hard  at all  Food Insecurity: No Food Insecurity (08/10/2022)   Received from [facility], [facility]   Hunger Vital Sign    Worried About Running Out of Food in the Last Year: Never true    Ran Out of Food in the Last Year: Never true  Transportation Needs: No Transportation Needs (08/10/2022)   Received from [facility], [facility]   PRAPARE - Transportation    In the past 12 months, has lack of transportation kept you from medical appointments or from getting medications?: No    Lack of Transportation (Non-Medical): No  Physical Activity: Not on file  Stress: Not on file  Social Connections: Not on file  Intimate Partner Violence: Not At Risk (08/02/2022)   Humiliation, Afraid, Rape, and Kick questionnaire    Fear of Current or Ex-Partner: No    Emotionally Abused: No    Physically Abused: No    Sexually Abused: No    FAMILY HISTORY: Family History  Problem Relation Age of Onset   Ovarian cancer Mother    Heart attack Father    Emphysema Sister    Obesity Paternal Uncle     ALLERGIES:  is allergic to ativan [lorazepam], benadryl [diphenhydramine], crestor [rosuvastatin calcium], nsaids, rapaflo [silodosin], statins, zetia [ezetimibe], latex, and neosporin [neomycin-bacitracin zn-polymyx].  MEDICATIONS:  Current Outpatient Medications  Medication Sig Dispense Refill   acetaminophen (TYLENOL) 325 MG tablet Take 650 mg by mouth every 6 (six) hours as needed.     Apoaequorin (PREVAGEN PO) Take by mouth daily.     benzonatate (TESSALON) 200 MG capsule Take 1 capsule (200 mg total) by mouth 3 (three) times daily as needed  for cough. 20 capsule 0   Calcium Citrate-Vitamin D (CITRACAL + D PO) Take by mouth 2 (two) times daily.     cyanocobalamin (VITAMIN B12) 1000 MCG/ML injection Inject 1,000 mcg into the muscle every 30 (thirty) days.     donepezil (ARICEPT) 5 MG tablet Take 5 mg by mouth in the morning.      finasteride (PROSCAR) 5 MG tablet Take 1 tablet by mouth daily.     KRILL OIL PO Take by mouth daily.     lidocaine-prilocaine (EMLA) cream Apply to affected area once 30 g 3   loperamide (IMODIUM) 2 MG capsule Take 1 capsule (2 mg total) by mouth See admin instructions. Initial: 4 mg,the 2 mg every 2 hours (4 mg every 4 hours at night)  maximum: 16 mg/day 60 capsule 2   losartan (COZAAR) 50 MG tablet Take 50 mg by mouth daily.     pramipexole (MIRAPEX) 0.5 MG tablet Take 0.5 mg by mouth at bedtime.     Probiotic Product (ALIGN PO) Take daily by mouth.     tamsulosin (FLOMAX) 0.4 MG CAPS capsule Take 0.4 mg by mouth daily.     Thiamine HCl (VITAMIN B1 PO) Take 1 tablet by mouth daily.     traMADol (ULTRAM) 50 MG tablet Take 50 mg every 6 (six) hours as needed by mouth.     afatinib dimaleate (GILOTRIF) 30 MG tablet Take 1 tablet (30 mg total) by mouth daily. Take on an empty stomach 1hr before or 2hrs after meals. 30 tablet 0   clindamycin (CLINDAGEL) 1 % gel Apply topically 2 (two) times daily. 60 g 6   ondansetron (ZOFRAN) 8 MG tablet Take 1 tablet (8 mg total) by mouth every 8 (eight) hours as needed for nausea or vomiting. Start on the third day after chemotherapy. (Patient not taking: Reported on 08/16/2022) 30 tablet 1   prochlorperazine (COMPAZINE) 10 MG tablet Take 1 tablet (10 mg total) by mouth every 6 (six) hours as needed for nausea or vomiting. (Patient not taking: Reported on 10/06/2022) 30 tablet 1   triamcinolone cream (KENALOG) 0.1 % Apply 1 Application topically 2 (two) times daily. 80 g 6   No current facility-administered medications for this visit.   Facility-Administered Medications Ordered in Other Visits  Medication Dose Route Frequency Provider Last Rate Last Admin   sodium chloride flush (NS) 0.9 % injection 10 mL  10 mL Intravenous Once Rickard Patience, MD        Review of Systems  Constitutional:  Positive for fatigue. Negative for appetite change, chills, fever and  unexpected weight change.  HENT:   Negative for hearing loss and voice change.   Eyes:  Negative for eye problems and icterus.  Respiratory:  Positive for cough. Negative for chest tightness and shortness of breath.   Cardiovascular:  Negative for chest pain and leg swelling.  Gastrointestinal:  Positive for constipation. Negative for abdominal distention and abdominal pain.  Endocrine: Negative for hot flashes.  Genitourinary:  Negative for difficulty urinating, dysuria and frequency.   Musculoskeletal:  Positive for arthralgias.  Skin:  Positive for rash. Negative for itching.  Neurological:  Positive for extremity weakness (Chronic). Negative for light-headedness and numbness.  Hematological:  Negative for adenopathy. Does not bruise/bleed easily.  Psychiatric/Behavioral:  Negative for confusion.      PHYSICAL EXAMINATION: ECOG PERFORMANCE STATUS: 1 - Symptomatic but completely ambulatory  Vitals:   10/06/22 1047  BP: (!) 115/56  Pulse: 96  Resp: 18  Temp: 97.7 F (36.5 C)  SpO2: 98%   Filed Weights   10/06/22 1047  Weight: 170 lb 3.2 oz (77.2 kg)    Physical Exam Constitutional:      General: He is not in acute distress.    Appearance: He is not diaphoretic.  HENT:     Head: Normocephalic and atraumatic.     Nose: Nose normal.     Mouth/Throat:     Pharynx: No oropharyngeal exudate.  Eyes:     General: No scleral icterus.    Pupils: Pupils are equal, round, and reactive to light.  Cardiovascular:     Rate and Rhythm: Normal rate.     Heart sounds: No murmur heard. Pulmonary:     Effort: Pulmonary effort is normal. No respiratory distress.  Abdominal:     General: There is no distension.  Musculoskeletal:        General: Normal range of motion.     Cervical back: Normal range of motion and neck supple.  Skin:    General: Skin is warm and dry.  Neurological:     Mental Status: He is alert and oriented to person, place, and time. Mental status is at  baseline.     Cranial Nerves: No cranial nerve deficit.     Motor: No abnormal muscle tone.  Psychiatric:        Mood and Affect: Mood and affect normal.      LABORATORY DATA:  I have reviewed the data as listed    Latest Ref Rng & Units 10/06/2022   10:26 AM 09/20/2022    9:34 AM 09/02/2022   12:56 PM  CBC  WBC 4.0 - 10.5 K/uL 7.5  10.0  8.9   Hemoglobin 13.0 - 17.0 g/dL 9.9  53.6  64.4   Hematocrit 39.0 - 52.0 % 30.2  33.4  34.2   Platelets 150 - 400 K/uL 283  342  281       Latest Ref Rng & Units 10/06/2022   10:26 AM 09/20/2022    9:34 AM 09/02/2022   12:56 PM  CMP  Glucose 70 - 99 mg/dL 034  742  96   BUN 8 - 23 mg/dL 14  14  18    Creatinine 0.61 - 1.24 mg/dL 5.95  6.38  7.56   Sodium 135 - 145 mmol/L 134  133  133   Potassium 3.5 - 5.1 mmol/L 4.0  4.1  4.2   Chloride 98 - 111 mmol/L 103  103  100   CO2 22 - 32 mmol/L 25  24  25    Calcium 8.9 - 10.3 mg/dL 9.3  9.3  9.4   Total Protein 6.5 - 8.1 g/dL 6.3  6.6  6.6   Total Bilirubin 0.3 - 1.2 mg/dL 0.5  0.5  0.3   Alkaline Phos 38 - 126 U/L 87  87  114   AST 15 - 41 U/L 19  15  15    ALT 0 - 44 U/L 22  18  22       RADIOGRAPHIC STUDIES: I have personally reviewed the radiological images as listed and agreed with the findings in the report. ECHOCARDIOGRAM COMPLETE  Result Date: 09/21/2022    ECHOCARDIOGRAM REPORT   Patient Name:   Wesley Young [place] Date of Exam: 09/21/2022 Medical Rec #:  [id number]      Height:       72.0 in Accession #:    [id number]     Weight:  171.2 lb Date of Birth:  02-06-1941      BSA:          1.994 m Patient Age:    82 years       BP:           136/67 mmHg Patient Gender: M              HR:           63 bpm. Exam Location:  [facility] Procedure: 2D Echo, Color Doppler and Cardiac Doppler Indications:     CHF-acute diastolic I50.31  History:         Patient has no prior history of Echocardiogram examinations.                  CHF and Cardiomyopathy; Risk Factors:Hypertension.  Sonographer:     Cristela Blue  Referring Phys:  [id number]   Diagnosing Phys: Marcina Millard MD IMPRESSIONS  1. Left ventricular ejection fraction, by estimation, is 65 to 70%. The left ventricle has normal function. The left ventricle has no regional wall motion abnormalities. Left ventricular diastolic parameters were normal.  2. Right ventricular systolic function is normal. The right ventricular size is normal.  3. The mitral valve is normal in structure. Mild mitral valve regurgitation. No evidence of mitral stenosis.  4. The aortic valve is normal in structure. Aortic valve regurgitation is mild. No aortic stenosis is present.  5. The inferior vena cava is normal in size with greater than 50% respiratory variability, suggesting right atrial pressure of 3 mmHg. FINDINGS  Left Ventricle: Left ventricular ejection fraction, by estimation, is 65 to 70%. The left ventricle has normal function. The left ventricle has no regional wall motion abnormalities. The left ventricular internal cavity size was normal in size. There is  no left ventricular hypertrophy. Left ventricular diastolic parameters were normal. Right Ventricle: The right ventricular size is normal. No increase in right ventricular wall thickness. Right ventricular systolic function is normal. Left Atrium: Left atrial size was normal in size. Right Atrium: Right atrial size was normal in size. Pericardium: There is no evidence of pericardial effusion. Mitral Valve: The mitral valve is normal in structure. Mild mitral valve regurgitation. No evidence of mitral valve stenosis. MV peak gradient, 3.4 mmHg. The mean mitral valve gradient is 2.0 mmHg. Tricuspid Valve: The tricuspid valve is normal in structure. Tricuspid valve regurgitation is mild . No evidence of tricuspid stenosis. Aortic Valve: The aortic valve is normal in structure. Aortic valve regurgitation is mild. No aortic stenosis is present. Aortic valve mean gradient measures 5.5 mmHg. Aortic valve peak gradient  measures 10.0 mmHg. Aortic valve area, by VTI measures 2.88  cm. Pulmonic Valve: The pulmonic valve was normal in structure. Pulmonic valve regurgitation is not visualized. No evidence of pulmonic stenosis. Aorta: The aortic root is normal in size and structure. Venous: The inferior vena cava is normal in size with greater than 50% respiratory variability, suggesting right atrial pressure of 3 mmHg. IAS/Shunts: No atrial level shunt detected by color flow Doppler.  LEFT VENTRICLE PLAX 2D LVIDd:         4.80 cm   Diastology LVIDs:         2.70 cm   LV e' medial:    14.50 cm/s LV PW:         0.90 cm   LV E/e' medial:  5.6 LV IVS:        1.50 cm   LV e' lateral:  10.30 cm/s LVOT diam:     2.00 cm   LV E/e' lateral: 7.9 LV SV:         85 LV SV Index:   42 LVOT Area:     3.14 cm  RIGHT VENTRICLE RV Basal diam:  3.50 cm RV Mid diam:    2.70 cm RV S prime:     13.50 cm/s TAPSE (M-mode): 2.7 cm LEFT ATRIUM           Index        RIGHT ATRIUM           Index LA diam:      3.00 cm 1.50 cm/m   RA Area:     15.80 cm LA Vol (A2C): 47.4 ml 23.77 ml/m  RA Volume:   35.70 ml  17.90 ml/m LA Vol (A4C): 21.2 ml 10.63 ml/m  AORTIC VALVE AV Area (Vmax):    2.79 cm AV Area (Vmean):   2.81 cm AV Area (VTI):     2.88 cm AV Vmax:           158.50 cm/s AV Vmean:          111.500 cm/s AV VTI:            0.294 m AV Peak Grad:      10.0 mmHg AV Mean Grad:      5.5 mmHg LVOT Vmax:         141.00 cm/s LVOT Vmean:        99.800 cm/s LVOT VTI:          0.269 m LVOT/AV VTI ratio: 0.92  AORTA Ao Root diam: 3.70 cm MITRAL VALVE               TRICUSPID VALVE MV Area (PHT): 3.15 cm    TR Peak grad:   30.0 mmHg MV Area VTI:   3.64 cm    TR Vmax:        274.00 cm/s MV Peak grad:  3.4 mmHg MV Mean grad:  2.0 mmHg    SHUNTS MV Vmax:       0.92 m/s    Systemic VTI:  0.27 m MV Vmean:      61.6 cm/s   Systemic Diam: 2.00 cm MV Decel Time: 241 msec MV E velocity: 81.20 cm/s MV A velocity: 72.30 cm/s MV E/A ratio:  1.12 Marcina Millard MD  Electronically signed by Marcina Millard MD Signature Date/Time: 09/21/2022/12:59:08 PM    Final

## 2022-10-06 NOTE — Progress Notes (Signed)
Pt here for follow up. Pt reports that rash has improved. Pt also reports that he has constipation and would like something other than Miralax.

## 2022-10-06 NOTE — Assessment & Plan Note (Signed)
Chemotherapy plan as listed above 

## 2022-10-06 NOTE — Assessment & Plan Note (Addendum)
MRI brain result was discussed with patient. Small brain metastatic disease.  stage IV squamous cell carcinoma Labs reviewed and discussed with patient He tolerates Afatinib 30 mg daily.  Continue. Continue follow-up with radiation oncology for radiation.

## 2022-10-06 NOTE — Assessment & Plan Note (Signed)
 Likely secondary to Afatinib Recommend patient to apply topical steroid cream as well as clindamycin gel

## 2022-10-07 ENCOUNTER — Other Ambulatory Visit: Payer: Self-pay

## 2022-10-07 ENCOUNTER — Ambulatory Visit
Admission: RE | Admit: 2022-10-07 | Discharge: 2022-10-07 | Disposition: A | Discharge status: Home / Self Care | Payer: Medicare Other | Source: Ambulatory Visit | Attending: Radiation Oncology | Admitting: Radiation Oncology

## 2022-10-07 DIAGNOSIS — Z510 Encounter for antineoplastic radiation therapy: Secondary | ICD-10-CM | POA: Diagnosis not present

## 2022-10-07 LAB — RAD ONC ARIA SESSION SUMMARY
Course Elapsed Days: 36
Plan Fractions Treated to Date: 27
Plan Prescribed Dose Per Fraction: 2 Gy
Plan Total Fractions Prescribed: 35
Plan Total Prescribed Dose: 70 Gy
Reference Point Dosage Given to Date: 54 Gy
Reference Point Session Dosage Given: 2 Gy
Session Number: 27

## 2022-10-10 ENCOUNTER — Other Ambulatory Visit: Payer: Self-pay

## 2022-10-10 ENCOUNTER — Ambulatory Visit
Admission: RE | Admit: 2022-10-10 | Discharge: 2022-10-10 | Disposition: A | Discharge status: Home / Self Care | Payer: Medicare Other | Source: Ambulatory Visit | Attending: Radiation Oncology | Admitting: Radiation Oncology

## 2022-10-10 DIAGNOSIS — Z510 Encounter for antineoplastic radiation therapy: Secondary | ICD-10-CM | POA: Diagnosis not present

## 2022-10-10 LAB — RAD ONC ARIA SESSION SUMMARY
Course Elapsed Days: 39
Plan Fractions Treated to Date: 28
Plan Prescribed Dose Per Fraction: 2 Gy
Plan Total Fractions Prescribed: 35
Plan Total Prescribed Dose: 70 Gy
Reference Point Dosage Given to Date: 56 Gy
Reference Point Session Dosage Given: 2 Gy
Session Number: 28

## 2022-10-11 ENCOUNTER — Other Ambulatory Visit: Payer: Self-pay

## 2022-10-11 ENCOUNTER — Ambulatory Visit
Admission: RE | Admit: 2022-10-11 | Discharge: 2022-10-11 | Disposition: A | Discharge status: Home / Self Care | Payer: Medicare Other | Source: Ambulatory Visit | Attending: Radiation Oncology | Admitting: Radiation Oncology

## 2022-10-11 DIAGNOSIS — Z510 Encounter for antineoplastic radiation therapy: Secondary | ICD-10-CM | POA: Diagnosis not present

## 2022-10-11 LAB — RAD ONC ARIA SESSION SUMMARY
Course Elapsed Days: 40
Plan Fractions Treated to Date: 29
Plan Prescribed Dose Per Fraction: 2 Gy
Plan Total Fractions Prescribed: 35
Plan Total Prescribed Dose: 70 Gy
Reference Point Dosage Given to Date: 58 Gy
Reference Point Session Dosage Given: 2 Gy
Session Number: 29

## 2022-10-12 ENCOUNTER — Other Ambulatory Visit: Payer: Self-pay

## 2022-10-12 ENCOUNTER — Ambulatory Visit
Admission: RE | Admit: 2022-10-12 | Discharge: 2022-10-12 | Disposition: A | Discharge status: Home / Self Care | Payer: Medicare Other | Source: Ambulatory Visit | Attending: Radiation Oncology | Admitting: Radiation Oncology

## 2022-10-12 DIAGNOSIS — Z510 Encounter for antineoplastic radiation therapy: Secondary | ICD-10-CM | POA: Diagnosis not present

## 2022-10-12 LAB — RAD ONC ARIA SESSION SUMMARY
Course Elapsed Days: 41
Plan Fractions Treated to Date: 30
Plan Prescribed Dose Per Fraction: 2 Gy
Plan Total Fractions Prescribed: 35
Plan Total Prescribed Dose: 70 Gy
Reference Point Dosage Given to Date: 60 Gy
Reference Point Session Dosage Given: 2 Gy
Session Number: 30

## 2022-10-13 ENCOUNTER — Ambulatory Visit
Admission: RE | Admit: 2022-10-13 | Discharge: 2022-10-13 | Disposition: A | Discharge status: Home / Self Care | Payer: Medicare Other | Source: Ambulatory Visit | Attending: Radiation Oncology | Admitting: Radiation Oncology

## 2022-10-13 ENCOUNTER — Other Ambulatory Visit: Payer: Self-pay

## 2022-10-13 ENCOUNTER — Inpatient Hospital Stay: Payer: Medicare Other

## 2022-10-13 DIAGNOSIS — Z510 Encounter for antineoplastic radiation therapy: Secondary | ICD-10-CM | POA: Diagnosis not present

## 2022-10-13 LAB — RAD ONC ARIA SESSION SUMMARY
Course Elapsed Days: 42
Plan Fractions Treated to Date: 31
Plan Prescribed Dose Per Fraction: 2 Gy
Plan Total Fractions Prescribed: 35
Plan Total Prescribed Dose: 70 Gy
Reference Point Dosage Given to Date: 62 Gy
Reference Point Session Dosage Given: 2 Gy
Session Number: 31

## 2022-10-13 NOTE — Progress Notes (Signed)
Nutrition Follow-up:  Patient with SCC of lung, stage IV.  Patient receiving radiation and afatinib.  Last radiation treatment planned for 8/28  Met with patient and wife following radiation.  Patient reports increased gas in stomach with bouts of liquid stool.  Was previously constipated about 1 week ago and took suppository.  Has been using gas x and imodium.  Wife with questions regarding medications.  Appetite has been doing fairly well.  Had hot dog with cheese recently.  Drinking premier protein shakes usually daily.      Medications: imodium  Labs: reviewed  Anthropometrics:   Weight 168 lb 3 oz 171 lb 3.2 oz on 8/1 178 lb on 01/25/22   NUTRITION DIAGNOSIS: Unintentional weight loss continues    INTERVENTION:  Message sent to Oral Chemo Pharmacist to answer wife's questions regarding medication Reviewed foods to choose with diarrhea. Handout provided.  Would like to keep diet liberalized as possible.   Encouraged increase in oral nutrition supplement if possible Encouraged increased fluid (1 cup after each loose bowel movement).      MONITORING, EVALUATION, GOAL: weight trends, intake   NEXT VISIT: phone call Sept Tuesday, 17  Almond Fitzgibbon B. Freida Busman, RD, LDN Registered Dietitian [phone]

## 2022-10-14 ENCOUNTER — Other Ambulatory Visit: Payer: Self-pay

## 2022-10-14 ENCOUNTER — Ambulatory Visit
Admission: RE | Admit: 2022-10-14 | Discharge: 2022-10-14 | Disposition: A | Discharge status: Home / Self Care | Payer: Medicare Other | Source: Ambulatory Visit | Attending: Radiation Oncology | Admitting: Radiation Oncology

## 2022-10-14 DIAGNOSIS — Z510 Encounter for antineoplastic radiation therapy: Secondary | ICD-10-CM | POA: Diagnosis not present

## 2022-10-14 LAB — RAD ONC ARIA SESSION SUMMARY
Course Elapsed Days: 43
Plan Fractions Treated to Date: 32
Plan Prescribed Dose Per Fraction: 2 Gy
Plan Total Fractions Prescribed: 35
Plan Total Prescribed Dose: 70 Gy
Reference Point Dosage Given to Date: 64 Gy
Reference Point Session Dosage Given: 2 Gy
Session Number: 32

## 2022-10-17 ENCOUNTER — Other Ambulatory Visit: Payer: Self-pay

## 2022-10-17 ENCOUNTER — Ambulatory Visit: Admission: RE | Admit: 2022-10-17 | Payer: Medicare Other | Source: Ambulatory Visit

## 2022-10-17 DIAGNOSIS — Z510 Encounter for antineoplastic radiation therapy: Secondary | ICD-10-CM | POA: Diagnosis not present

## 2022-10-17 LAB — RAD ONC ARIA SESSION SUMMARY
Course Elapsed Days: 46
Plan Fractions Treated to Date: 33
Plan Prescribed Dose Per Fraction: 2 Gy
Plan Total Fractions Prescribed: 35
Plan Total Prescribed Dose: 70 Gy
Reference Point Dosage Given to Date: 66 Gy
Reference Point Session Dosage Given: 2 Gy
Session Number: 33

## 2022-10-18 ENCOUNTER — Other Ambulatory Visit: Payer: Self-pay

## 2022-10-18 ENCOUNTER — Ambulatory Visit
Admission: RE | Admit: 2022-10-18 | Discharge: 2022-10-18 | Disposition: A | Discharge status: Home / Self Care | Payer: Medicare Other | Source: Ambulatory Visit | Attending: Radiation Oncology | Admitting: Radiation Oncology

## 2022-10-18 DIAGNOSIS — Z510 Encounter for antineoplastic radiation therapy: Secondary | ICD-10-CM | POA: Diagnosis not present

## 2022-10-18 LAB — RAD ONC ARIA SESSION SUMMARY
Course Elapsed Days: 47
Plan Fractions Treated to Date: 34
Plan Prescribed Dose Per Fraction: 2 Gy
Plan Total Fractions Prescribed: 35
Plan Total Prescribed Dose: 70 Gy
Reference Point Dosage Given to Date: 68 Gy
Reference Point Session Dosage Given: 2 Gy
Session Number: 34

## 2022-10-19 ENCOUNTER — Ambulatory Visit
Admission: RE | Admit: 2022-10-19 | Discharge: 2022-10-19 | Disposition: A | Discharge status: Home / Self Care | Payer: Medicare Other | Source: Ambulatory Visit | Attending: Radiation Oncology | Admitting: Radiation Oncology

## 2022-10-19 ENCOUNTER — Other Ambulatory Visit: Payer: Self-pay

## 2022-10-19 DIAGNOSIS — Z510 Encounter for antineoplastic radiation therapy: Secondary | ICD-10-CM | POA: Diagnosis not present

## 2022-10-19 LAB — RAD ONC ARIA SESSION SUMMARY
Course Elapsed Days: 48
Plan Fractions Treated to Date: 35
Plan Prescribed Dose Per Fraction: 2 Gy
Plan Total Fractions Prescribed: 35
Plan Total Prescribed Dose: 70 Gy
Reference Point Dosage Given to Date: 70 Gy
Reference Point Session Dosage Given: 2 Gy
Session Number: 35

## 2022-10-25 ENCOUNTER — Inpatient Hospital Stay: Payer: Medicare Other | Admitting: Oncology

## 2022-10-25 ENCOUNTER — Inpatient Hospital Stay
Admit: 2022-10-25 | Discharge: 2022-10-25 | Disposition: A | Discharge status: Home / Self Care | Payer: Medicare Other | Attending: Internal Medicine | Admitting: Internal Medicine

## 2022-10-25 ENCOUNTER — Telehealth: Payer: Self-pay | Admitting: *Deleted

## 2022-10-25 ENCOUNTER — Emergency Department: Payer: Medicare Other

## 2022-10-25 ENCOUNTER — Inpatient Hospital Stay: Payer: Medicare Other

## 2022-10-25 ENCOUNTER — Other Ambulatory Visit: Payer: Self-pay

## 2022-10-25 ENCOUNTER — Inpatient Hospital Stay
Admission: EM | Admit: 2022-10-25 | Discharge: 2022-11-22 | DRG: 163 | Disposition: E | Payer: Medicare Other | Attending: Internal Medicine | Admitting: Internal Medicine

## 2022-10-25 ENCOUNTER — Encounter: Payer: Self-pay | Admitting: Emergency Medicine

## 2022-10-25 DIAGNOSIS — C3492 Malignant neoplasm of unspecified part of left bronchus or lung: Secondary | ICD-10-CM | POA: Diagnosis not present

## 2022-10-25 DIAGNOSIS — Z79899 Other long term (current) drug therapy: Secondary | ICD-10-CM

## 2022-10-25 DIAGNOSIS — Z515 Encounter for palliative care: Secondary | ICD-10-CM | POA: Diagnosis not present

## 2022-10-25 DIAGNOSIS — L89151 Pressure ulcer of sacral region, stage 1: Secondary | ICD-10-CM | POA: Diagnosis not present

## 2022-10-25 DIAGNOSIS — G9341 Metabolic encephalopathy: Secondary | ICD-10-CM | POA: Diagnosis not present

## 2022-10-25 DIAGNOSIS — D649 Anemia, unspecified: Secondary | ICD-10-CM

## 2022-10-25 DIAGNOSIS — I10 Essential (primary) hypertension: Secondary | ICD-10-CM | POA: Diagnosis present

## 2022-10-25 DIAGNOSIS — Z7901 Long term (current) use of anticoagulants: Secondary | ICD-10-CM

## 2022-10-25 DIAGNOSIS — G8929 Other chronic pain: Secondary | ICD-10-CM | POA: Diagnosis present

## 2022-10-25 DIAGNOSIS — C3412 Malignant neoplasm of upper lobe, left bronchus or lung: Secondary | ICD-10-CM | POA: Diagnosis present

## 2022-10-25 DIAGNOSIS — C7931 Secondary malignant neoplasm of brain: Secondary | ICD-10-CM | POA: Diagnosis present

## 2022-10-25 DIAGNOSIS — F0390 Unspecified dementia without behavioral disturbance: Secondary | ICD-10-CM | POA: Diagnosis present

## 2022-10-25 DIAGNOSIS — I639 Cerebral infarction, unspecified: Secondary | ICD-10-CM

## 2022-10-25 DIAGNOSIS — I110 Hypertensive heart disease with heart failure: Secondary | ICD-10-CM | POA: Diagnosis present

## 2022-10-25 DIAGNOSIS — F32A Depression, unspecified: Secondary | ICD-10-CM | POA: Diagnosis present

## 2022-10-25 DIAGNOSIS — E7800 Pure hypercholesterolemia, unspecified: Secondary | ICD-10-CM | POA: Diagnosis present

## 2022-10-25 DIAGNOSIS — Z87891 Personal history of nicotine dependence: Secondary | ICD-10-CM

## 2022-10-25 DIAGNOSIS — C3490 Malignant neoplasm of unspecified part of unspecified bronchus or lung: Secondary | ICD-10-CM | POA: Diagnosis present

## 2022-10-25 DIAGNOSIS — R5381 Other malaise: Secondary | ICD-10-CM | POA: Diagnosis present

## 2022-10-25 DIAGNOSIS — I2699 Other pulmonary embolism without acute cor pulmonale: Secondary | ICD-10-CM | POA: Diagnosis present

## 2022-10-25 DIAGNOSIS — D6869 Other thrombophilia: Secondary | ICD-10-CM | POA: Diagnosis present

## 2022-10-25 DIAGNOSIS — R471 Dysarthria and anarthria: Secondary | ICD-10-CM | POA: Diagnosis not present

## 2022-10-25 DIAGNOSIS — Z8041 Family history of malignant neoplasm of ovary: Secondary | ICD-10-CM

## 2022-10-25 DIAGNOSIS — I429 Cardiomyopathy, unspecified: Secondary | ICD-10-CM | POA: Diagnosis present

## 2022-10-25 DIAGNOSIS — Z9841 Cataract extraction status, right eye: Secondary | ICD-10-CM

## 2022-10-25 DIAGNOSIS — J189 Pneumonia, unspecified organism: Secondary | ICD-10-CM | POA: Diagnosis present

## 2022-10-25 DIAGNOSIS — F419 Anxiety disorder, unspecified: Secondary | ICD-10-CM | POA: Diagnosis present

## 2022-10-25 DIAGNOSIS — I351 Nonrheumatic aortic (valve) insufficiency: Secondary | ICD-10-CM | POA: Diagnosis present

## 2022-10-25 DIAGNOSIS — R4701 Aphasia: Secondary | ICD-10-CM | POA: Diagnosis not present

## 2022-10-25 DIAGNOSIS — F418 Other specified anxiety disorders: Secondary | ICD-10-CM | POA: Diagnosis present

## 2022-10-25 DIAGNOSIS — R0602 Shortness of breath: Secondary | ICD-10-CM

## 2022-10-25 DIAGNOSIS — R000 Tachycardia, unspecified: Secondary | ICD-10-CM | POA: Diagnosis not present

## 2022-10-25 DIAGNOSIS — R110 Nausea: Secondary | ICD-10-CM | POA: Diagnosis present

## 2022-10-25 DIAGNOSIS — I6340 Cerebral infarction due to embolism of unspecified cerebral artery: Secondary | ICD-10-CM | POA: Diagnosis not present

## 2022-10-25 DIAGNOSIS — Z66 Do not resuscitate: Secondary | ICD-10-CM | POA: Diagnosis not present

## 2022-10-25 DIAGNOSIS — I82441 Acute embolism and thrombosis of right tibial vein: Secondary | ICD-10-CM | POA: Diagnosis present

## 2022-10-25 DIAGNOSIS — Z825 Family history of asthma and other chronic lower respiratory diseases: Secondary | ICD-10-CM

## 2022-10-25 DIAGNOSIS — L8990 Pressure ulcer of unspecified site, unspecified stage: Secondary | ICD-10-CM | POA: Insufficient documentation

## 2022-10-25 DIAGNOSIS — Z9221 Personal history of antineoplastic chemotherapy: Secondary | ICD-10-CM

## 2022-10-25 DIAGNOSIS — E440 Moderate protein-calorie malnutrition: Secondary | ICD-10-CM | POA: Diagnosis present

## 2022-10-25 DIAGNOSIS — Z6823 Body mass index (BMI) 23.0-23.9, adult: Secondary | ICD-10-CM

## 2022-10-25 DIAGNOSIS — N400 Enlarged prostate without lower urinary tract symptoms: Secondary | ICD-10-CM | POA: Diagnosis present

## 2022-10-25 DIAGNOSIS — G4733 Obstructive sleep apnea (adult) (pediatric): Secondary | ICD-10-CM | POA: Diagnosis present

## 2022-10-25 DIAGNOSIS — Z888 Allergy status to other drugs, medicaments and biological substances status: Secondary | ICD-10-CM

## 2022-10-25 DIAGNOSIS — D491 Neoplasm of unspecified behavior of respiratory system: Secondary | ICD-10-CM | POA: Diagnosis present

## 2022-10-25 DIAGNOSIS — I480 Paroxysmal atrial fibrillation: Secondary | ICD-10-CM | POA: Diagnosis present

## 2022-10-25 DIAGNOSIS — I509 Heart failure, unspecified: Secondary | ICD-10-CM | POA: Diagnosis present

## 2022-10-25 DIAGNOSIS — J9601 Acute respiratory failure with hypoxia: Secondary | ICD-10-CM | POA: Diagnosis present

## 2022-10-25 DIAGNOSIS — Z85828 Personal history of other malignant neoplasm of skin: Secondary | ICD-10-CM

## 2022-10-25 DIAGNOSIS — Z9842 Cataract extraction status, left eye: Secondary | ICD-10-CM

## 2022-10-25 DIAGNOSIS — Z881 Allergy status to other antibiotic agents status: Secondary | ICD-10-CM

## 2022-10-25 DIAGNOSIS — R1310 Dysphagia, unspecified: Secondary | ICD-10-CM | POA: Diagnosis not present

## 2022-10-25 DIAGNOSIS — Z923 Personal history of irradiation: Secondary | ICD-10-CM

## 2022-10-25 DIAGNOSIS — G629 Polyneuropathy, unspecified: Secondary | ICD-10-CM | POA: Diagnosis present

## 2022-10-25 DIAGNOSIS — I358 Other nonrheumatic aortic valve disorders: Secondary | ICD-10-CM | POA: Diagnosis present

## 2022-10-25 DIAGNOSIS — Z95828 Presence of other vascular implants and grafts: Secondary | ICD-10-CM

## 2022-10-25 DIAGNOSIS — Z7189 Other specified counseling: Secondary | ICD-10-CM | POA: Diagnosis not present

## 2022-10-25 DIAGNOSIS — R82998 Other abnormal findings in urine: Secondary | ICD-10-CM

## 2022-10-25 DIAGNOSIS — R451 Restlessness and agitation: Secondary | ICD-10-CM | POA: Diagnosis not present

## 2022-10-25 DIAGNOSIS — Z961 Presence of intraocular lens: Secondary | ICD-10-CM | POA: Diagnosis present

## 2022-10-25 DIAGNOSIS — K219 Gastro-esophageal reflux disease without esophagitis: Secondary | ICD-10-CM | POA: Diagnosis present

## 2022-10-25 DIAGNOSIS — Z86006 Personal history of melanoma in-situ: Secondary | ICD-10-CM

## 2022-10-25 DIAGNOSIS — Z91040 Latex allergy status: Secondary | ICD-10-CM

## 2022-10-25 DIAGNOSIS — Z8249 Family history of ischemic heart disease and other diseases of the circulatory system: Secondary | ICD-10-CM

## 2022-10-25 DIAGNOSIS — F05 Delirium due to known physiological condition: Secondary | ICD-10-CM | POA: Diagnosis not present

## 2022-10-25 LAB — URINALYSIS, W/ REFLEX TO CULTURE (INFECTION SUSPECTED)
Bacteria, UA: NONE SEEN
Bilirubin Urine: NEGATIVE
Glucose, UA: NEGATIVE mg/dL
Ketones, ur: NEGATIVE mg/dL
Leukocytes,Ua: NEGATIVE
Nitrite: NEGATIVE
Protein, ur: NEGATIVE mg/dL
Specific Gravity, Urine: 1.023 (ref 1.005–1.030)
Squamous Epithelial / HPF: NONE SEEN /HPF (ref 0–5)
pH: 6 (ref 5.0–8.0)

## 2022-10-25 LAB — CBC WITH DIFFERENTIAL/PLATELET
Abs Immature Granulocytes: 0.07 10*3/uL (ref 0.00–0.07)
Basophils Absolute: 0 10*3/uL (ref 0.0–0.1)
Basophils Relative: 0 %
Eosinophils Absolute: 0.3 10*3/uL (ref 0.0–0.5)
Eosinophils Relative: 2 %
HCT: 27.5 % — ABNORMAL LOW (ref 39.0–52.0)
Hemoglobin: 8.9 g/dL — ABNORMAL LOW (ref 13.0–17.0)
Immature Granulocytes: 1 %
Lymphocytes Relative: 1 %
Lymphs Abs: 0.1 10*3/uL — ABNORMAL LOW (ref 0.7–4.0)
MCH: 28.8 pg (ref 26.0–34.0)
MCHC: 32.4 g/dL (ref 30.0–36.0)
MCV: 89 fL (ref 80.0–100.0)
Monocytes Absolute: 1 10*3/uL (ref 0.1–1.0)
Monocytes Relative: 8 %
Neutro Abs: 10.6 10*3/uL — ABNORMAL HIGH (ref 1.7–7.7)
Neutrophils Relative %: 88 %
Platelets: 261 10*3/uL (ref 150–400)
RBC: 3.09 MIL/uL — ABNORMAL LOW (ref 4.22–5.81)
RDW: 13.2 % (ref 11.5–15.5)
WBC: 12.1 10*3/uL — ABNORMAL HIGH (ref 4.0–10.5)
nRBC: 0 % (ref 0.0–0.2)

## 2022-10-25 LAB — PROTIME-INR
INR: 1.6 — ABNORMAL HIGH (ref 0.8–1.2)
Prothrombin Time: 18.9 s — ABNORMAL HIGH (ref 11.4–15.2)

## 2022-10-25 LAB — COMPREHENSIVE METABOLIC PANEL
ALT: 37 U/L (ref 0–44)
AST: 33 U/L (ref 15–41)
Albumin: 2.7 g/dL — ABNORMAL LOW (ref 3.5–5.0)
Alkaline Phosphatase: 83 U/L (ref 38–126)
Anion gap: 13 (ref 5–15)
BUN: 17 mg/dL (ref 8–23)
CO2: 21 mmol/L — ABNORMAL LOW (ref 22–32)
Calcium: 9.1 mg/dL (ref 8.9–10.3)
Chloride: 99 mmol/L (ref 98–111)
Creatinine, Ser: 0.93 mg/dL (ref 0.61–1.24)
GFR, Estimated: >60 mL/min (ref >60)
Glucose, Bld: 212 mg/dL — ABNORMAL HIGH (ref 70–99)
Potassium: 3.7 mmol/L (ref 3.5–5.1)
Sodium: 133 mmol/L — ABNORMAL LOW (ref 135–145)
Total Bilirubin: 0.7 mg/dL (ref 0.3–1.2)
Total Protein: 5.9 g/dL — ABNORMAL LOW (ref 6.5–8.1)

## 2022-10-25 LAB — LACTIC ACID, PLASMA
Lactic Acid, Venous: 3.1 mmol/L (ref 0.5–1.9)
Lactic Acid, Venous: 1.3 mmol/L (ref 0.5–1.9)

## 2022-10-25 LAB — PROCALCITONIN: Procalcitonin: 0.14 ng/mL

## 2022-10-25 MED ORDER — LACTATED RINGERS IV BOLUS
1000.0000 mL | Freq: Once | INTRAVENOUS | Status: AC
  Administered 2022-10-25: 1000 mL via INTRAVENOUS

## 2022-10-25 MED ORDER — TRAMADOL HCL 50 MG PO TABS
50.0000 mg | ORAL_TABLET | Freq: Once | ORAL | Status: AC
  Administered 2022-10-25: 50 mg via ORAL
  Filled 2022-10-25: qty 1

## 2022-10-25 MED ORDER — ACETAMINOPHEN 325 MG PO TABS
650.0000 mg | ORAL_TABLET | Freq: Four times a day (QID) | ORAL | Status: AC | PRN
  Administered 2022-10-28: 650 mg via ORAL
  Filled 2022-10-25: qty 2

## 2022-10-25 MED ORDER — IOHEXOL 350 MG/ML SOLN
80.0000 mL | Freq: Once | INTRAVENOUS | Status: AC | PRN
  Administered 2022-10-25: 75 mL via INTRAVENOUS

## 2022-10-25 MED ORDER — ACETAMINOPHEN 650 MG RE SUPP: 650.0000 mg | Freq: Four times a day (QID) | RECTAL | Status: AC | PRN

## 2022-10-25 MED ORDER — TRAMADOL HCL 50 MG PO TABS
50.0000 mg | ORAL_TABLET | Freq: Four times a day (QID) | ORAL | Status: DC
  Administered 2022-10-25 – 2022-10-29 (×16): 50 mg via ORAL
  Filled 2022-10-25 (×16): qty 1

## 2022-10-25 MED ORDER — SODIUM CHLORIDE 0.9 % IV SOLN
2.0000 g | Freq: Once | INTRAVENOUS | Status: AC
  Administered 2022-10-25: 2 g via INTRAVENOUS
  Filled 2022-10-25: qty 12.5

## 2022-10-25 MED ORDER — PRAMIPEXOLE DIHYDROCHLORIDE 0.25 MG PO TABS
0.5000 mg | ORAL_TABLET | Freq: Every day | ORAL | Status: DC
  Administered 2022-10-25 – 2022-10-29 (×5): 0.5 mg via ORAL
  Filled 2022-10-25 (×5): qty 2

## 2022-10-25 MED ORDER — TRAMADOL HCL 50 MG PO TABS: 50.0000 mg | ORAL_TABLET | Freq: Four times a day (QID) | ORAL | Status: DC

## 2022-10-25 MED ORDER — SENNOSIDES-DOCUSATE SODIUM 8.6-50 MG PO TABS: 1.0000 | ORAL_TABLET | Freq: Every evening | ORAL | Status: DC | PRN

## 2022-10-25 MED ORDER — ONDANSETRON HCL 4 MG/2ML IJ SOLN: 4.0000 mg | Freq: Four times a day (QID) | INTRAMUSCULAR | Status: AC | PRN

## 2022-10-25 MED ORDER — MORPHINE SULFATE (PF) 4 MG/ML IV SOLN
4.0000 mg | Freq: Once | INTRAVENOUS | Status: DC
  Filled 2022-10-25: qty 1

## 2022-10-25 MED ORDER — HEPARIN (PORCINE) 25000 UT/250ML-% IV SOLN
1850.0000 [IU]/h | INTRAVENOUS | Status: DC
  Administered 2022-10-25: 1100 [IU]/h via INTRAVENOUS
  Administered 2022-10-26: 1350 [IU]/h via INTRAVENOUS
  Administered 2022-10-27: 1750 [IU]/h via INTRAVENOUS
  Administered 2022-10-28: 1850 [IU]/h via INTRAVENOUS
  Filled 2022-10-25 (×5): qty 250

## 2022-10-25 MED ORDER — VANCOMYCIN HCL 1500 MG/300ML IV SOLN
1500.0000 mg | Freq: Once | INTRAVENOUS | Status: AC
  Administered 2022-10-25: 1500 mg via INTRAVENOUS
  Filled 2022-10-25: qty 300

## 2022-10-25 MED ORDER — ONDANSETRON HCL 4 MG PO TABS: 4.0000 mg | ORAL_TABLET | Freq: Four times a day (QID) | ORAL | Status: AC | PRN

## 2022-10-25 MED ORDER — HEPARIN BOLUS VIA INFUSION
5000.0000 [IU] | Freq: Once | INTRAVENOUS | Status: AC
  Administered 2022-10-25: 5000 [IU] via INTRAVENOUS
  Filled 2022-10-25: qty 5000

## 2022-10-25 MED ORDER — VANCOMYCIN HCL 10 G IV SOLR
20.0000 mg/kg | Freq: Once | INTRAVENOUS | Status: DC
Start: 2022-10-25 — End: 2022-10-25

## 2022-10-25 NOTE — Assessment & Plan Note (Addendum)
Heparin per pharmacy ordered Complete echo ordered Vascular specialist has been consulted for consideration of thrombectomy N.p.o. after midnight

## 2022-10-25 NOTE — Telephone Encounter (Signed)
Patient currently in the ER.

## 2022-10-25 NOTE — Hospital Course (Addendum)
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  He was found to have L-sided PNA (on Cefepime) and B PE and underwent thrombectomy on 9/4.  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  He continued to have progressive respiratory failure with dysphagia and ultimately family transitioned to comfort measures and he died within a couple of hours.

## 2022-10-25 NOTE — ED Provider Notes (Addendum)
.  Critical Care  Performed by: Sharman Cheek, MD Authorized by: Sharman Cheek, MD   Critical care provider statement:    Critical care time (minutes):  35   Critical care time was exclusive of:  Separately billable procedures and treating other patients   Critical care was necessary to treat or prevent imminent or life-threatening deterioration of the following conditions:  Respiratory failure and circulatory failure   Critical care was time spent personally by me on the following activities:  Development of treatment plan with patient or surrogate, discussions with consultants, evaluation of patient's response to treatment, examination of patient, obtaining history from patient or surrogate, ordering and performing treatments and interventions, ordering and review of laboratory studies, ordering and review of radiographic studies, pulse oximetry, re-evaluation of patient's condition and review of old charts   Care discussed with: admitting provider     Clinical Course as of 10/25/22 1620  Tue Oct 25, 2022  1415 Comprehensive metabolic panel(!) Mild hypocarbia present [DW]  1420 Lactic Acid, Venous(!!): 3.1 [DW]  1532 Spoke with patient's oncologist Dr. Cathie Hoops who recommends if pneumonia confirmed on CT then would recommending stopping Afatinib. Patient signed out pending results of CTA chest before admission [DW]    Clinical Course User Index [DW] Janith Lima, MD    ----------------------------------------- 4:20 PM on 10/25/2022 ----------------------------------------- CT chest findings discussed with radiology, noting multiple lobar and segmental PEs, not central, no significant heart strain.  Also some airspace opacity which may be postradiation change.  Will add on procalcitonin.  Lower extremity ultrasound also shows DVT.  Will start heparin and admit  ----------------------------------------- 4:31 PM on 10/25/2022 ----------------------------------------- Case discussed  with hospitalist.   Final diagnoses:  Acute respiratory failure with hypoxia (HCC)  Other acute pulmonary embolism without acute cor pulmonale (HCC)      Sharman Cheek, MD 10/25/22 1621    Sharman Cheek, MD 10/25/22 [phone]

## 2022-10-25 NOTE — Telephone Encounter (Signed)
Spoke to pt's wife Johnny Bridge who states that pt has been progressively feeling worse and has refused to go to ER. Per wife, pt is stable enough to come to clinic. Advised her that Dr. Cathie Hoops recommends Covid testing. She does not think that he has Covid because he has not been around anyone but she will test him with a home Kit.  Informed her that if test is positive he will need to go to ER.  If test is negative they will come in to clinic for lab /MD/ IVF after STAT chest xray.  Johnny Bridge verbalized understading and will call back to let me know if result is Positive.

## 2022-10-25 NOTE — Assessment & Plan Note (Signed)
Per ED report, medical oncologist recommended to hold afatinib on admission

## 2022-10-25 NOTE — Consult Note (Signed)
Start on the third day after chemotherapy. 08/11/22  Yes Wesley Patience, MD  pramipexole (MIRAPEX) 0.5 MG tablet Take 0.5 mg by mouth at bedtime.   Yes [provider]  Probiotic Product (ALIGN PO) Take daily by mouth.   Yes [provider]  prochlorperazine (COMPAZINE) 10 MG tablet Take 1 tablet (10 mg total) by mouth every 6 (six) hours as needed for nausea or vomiting. 08/11/22  Yes Wesley Patience, MD  tamsulosin (FLOMAX) 0.4 MG CAPS capsule Take 0.4 mg by mouth daily.   Yes [provider]  Thiamine HCl (VITAMIN B1 PO) Take 1 tablet by mouth daily.   Yes [provider]  traMADol (ULTRAM) 50 MG tablet Take 50 mg by mouth 4 (four) times daily. SCHEDULED DOSES   Yes [provider]  triamcinolone cream (KENALOG) 0.1 % Apply 1 Application topically 2 (two) times daily. 10/06/22  Yes Wesley Patience, MD    Social History   Socioeconomic History   Marital status: Married    Spouse name: Not on file   Number of children: Not on file   Years of education: Not on file   Highest education level: Not on file  Occupational History   Not on file  Tobacco Use   Smoking status: Former    Current packs/day: 0.00    Types: Pipe, Cigarettes    Start date: 20    Quit date: 1977    Years since  quitting: 47.7   Smokeless tobacco: Never  Vaping Use   Vaping status: Never Used  Substance and Sexual Activity   Alcohol use: Not Currently    Comment: quit 03/26/2008   Drug use: Not Currently   Sexual activity: Not Currently  Other Topics Concern   Not on file  Social History Narrative   Not on file   Social Determinants of Health   Financial Resource Strain: Low Risk  (08/10/2022)   Received from [facility], [facility]   Overall Financial Resource Strain (CARDIA)    Difficulty of Paying Living Expenses: Not hard at all  Food Insecurity: No Food Insecurity (08/10/2022)   Received from [facility], [facility]   Hunger Vital Sign    Worried About Running Out of Food in the Last Year: Never true    Ran Out of Food in the Last Year: Never true  Transportation Needs: No Transportation Needs (08/10/2022)   Received from [facility], [facility]   [facility] - Transportation    In the past 12 months, has lack of transportation kept you from medical appointments or from getting medications?: No    Lack of Transportation (Non-Medical): No  Physical Activity: Not on file  Stress: Not on file  Social Connections: Not on file  Intimate Partner Violence: Not At Risk (08/02/2022)   Humiliation, Afraid, Rape, and Kick questionnaire    Fear of Current or Ex-Partner: No    Emotionally Abused: No    Physically Abused: No    Sexually Abused: No     Family History  Problem Relation Age of Onset   Ovarian cancer Mother    Heart attack Father    Emphysema Sister    Obesity Paternal Uncle     ROS: Otherwise negative unless mentioned in HPI  Physical Examination  Vitals:   11/01/2022 1830 11/18/2022 1853  BP: 135/83   Pulse: 85   Resp: (!) 24   Temp:  99 F (37.2 C)  Start on the third day after chemotherapy. 08/11/22  Yes Wesley Patience, MD  pramipexole (MIRAPEX) 0.5 MG tablet Take 0.5 mg by mouth at bedtime.   Yes [provider]  Probiotic Product (ALIGN PO) Take daily by mouth.   Yes [provider]  prochlorperazine (COMPAZINE) 10 MG tablet Take 1 tablet (10 mg total) by mouth every 6 (six) hours as needed for nausea or vomiting. 08/11/22  Yes Wesley Patience, MD  tamsulosin (FLOMAX) 0.4 MG CAPS capsule Take 0.4 mg by mouth daily.   Yes [provider]  Thiamine HCl (VITAMIN B1 PO) Take 1 tablet by mouth daily.   Yes [provider]  traMADol (ULTRAM) 50 MG tablet Take 50 mg by mouth 4 (four) times daily. SCHEDULED DOSES   Yes [provider]  triamcinolone cream (KENALOG) 0.1 % Apply 1 Application topically 2 (two) times daily. 10/06/22  Yes Wesley Patience, MD    Social History   Socioeconomic History   Marital status: Married    Spouse name: Not on file   Number of children: Not on file   Years of education: Not on file   Highest education level: Not on file  Occupational History   Not on file  Tobacco Use   Smoking status: Former    Current packs/day: 0.00    Types: Pipe, Cigarettes    Start date: 20    Quit date: 1977    Years since  quitting: 47.7   Smokeless tobacco: Never  Vaping Use   Vaping status: Never Used  Substance and Sexual Activity   Alcohol use: Not Currently    Comment: quit 03/26/2008   Drug use: Not Currently   Sexual activity: Not Currently  Other Topics Concern   Not on file  Social History Narrative   Not on file   Social Determinants of Health   Financial Resource Strain: Low Risk  (08/10/2022)   Received from [facility], [facility]   Overall Financial Resource Strain (CARDIA)    Difficulty of Paying Living Expenses: Not hard at all  Food Insecurity: No Food Insecurity (08/10/2022)   Received from [facility], [facility]   Hunger Vital Sign    Worried About Running Out of Food in the Last Year: Never true    Ran Out of Food in the Last Year: Never true  Transportation Needs: No Transportation Needs (08/10/2022)   Received from [facility], [facility]   [facility] - Transportation    In the past 12 months, has lack of transportation kept you from medical appointments or from getting medications?: No    Lack of Transportation (Non-Medical): No  Physical Activity: Not on file  Stress: Not on file  Social Connections: Not on file  Intimate Partner Violence: Not At Risk (08/02/2022)   Humiliation, Afraid, Rape, and Kick questionnaire    Fear of Current or Ex-Partner: No    Emotionally Abused: No    Physically Abused: No    Sexually Abused: No     Family History  Problem Relation Age of Onset   Ovarian cancer Mother    Heart attack Father    Emphysema Sister    Obesity Paternal Uncle     ROS: Otherwise negative unless mentioned in HPI  Physical Examination  Vitals:   11/01/2022 1830 11/18/2022 1853  BP: 135/83   Pulse: 85   Resp: (!) 24   Temp:  99 F (37.2 C)  Start on the third day after chemotherapy. 08/11/22  Yes Wesley Patience, MD  pramipexole (MIRAPEX) 0.5 MG tablet Take 0.5 mg by mouth at bedtime.   Yes [provider]  Probiotic Product (ALIGN PO) Take daily by mouth.   Yes [provider]  prochlorperazine (COMPAZINE) 10 MG tablet Take 1 tablet (10 mg total) by mouth every 6 (six) hours as needed for nausea or vomiting. 08/11/22  Yes Wesley Patience, MD  tamsulosin (FLOMAX) 0.4 MG CAPS capsule Take 0.4 mg by mouth daily.   Yes [provider]  Thiamine HCl (VITAMIN B1 PO) Take 1 tablet by mouth daily.   Yes [provider]  traMADol (ULTRAM) 50 MG tablet Take 50 mg by mouth 4 (four) times daily. SCHEDULED DOSES   Yes [provider]  triamcinolone cream (KENALOG) 0.1 % Apply 1 Application topically 2 (two) times daily. 10/06/22  Yes Wesley Patience, MD    Social History   Socioeconomic History   Marital status: Married    Spouse name: Not on file   Number of children: Not on file   Years of education: Not on file   Highest education level: Not on file  Occupational History   Not on file  Tobacco Use   Smoking status: Former    Current packs/day: 0.00    Types: Pipe, Cigarettes    Start date: 20    Quit date: 1977    Years since  quitting: 47.7   Smokeless tobacco: Never  Vaping Use   Vaping status: Never Used  Substance and Sexual Activity   Alcohol use: Not Currently    Comment: quit 03/26/2008   Drug use: Not Currently   Sexual activity: Not Currently  Other Topics Concern   Not on file  Social History Narrative   Not on file   Social Determinants of Health   Financial Resource Strain: Low Risk  (08/10/2022)   Received from [facility], [facility]   Overall Financial Resource Strain (CARDIA)    Difficulty of Paying Living Expenses: Not hard at all  Food Insecurity: No Food Insecurity (08/10/2022)   Received from [facility], [facility]   Hunger Vital Sign    Worried About Running Out of Food in the Last Year: Never true    Ran Out of Food in the Last Year: Never true  Transportation Needs: No Transportation Needs (08/10/2022)   Received from [facility], [facility]   [facility] - Transportation    In the past 12 months, has lack of transportation kept you from medical appointments or from getting medications?: No    Lack of Transportation (Non-Medical): No  Physical Activity: Not on file  Stress: Not on file  Social Connections: Not on file  Intimate Partner Violence: Not At Risk (08/02/2022)   Humiliation, Afraid, Rape, and Kick questionnaire    Fear of Current or Ex-Partner: No    Emotionally Abused: No    Physically Abused: No    Sexually Abused: No     Family History  Problem Relation Age of Onset   Ovarian cancer Mother    Heart attack Father    Emphysema Sister    Obesity Paternal Uncle     ROS: Otherwise negative unless mentioned in HPI  Physical Examination  Vitals:   11/01/2022 1830 11/18/2022 1853  BP: 135/83   Pulse: 85   Resp: (!) 24   Temp:  99 F (37.2 C)  Start on the third day after chemotherapy. 08/11/22  Yes Wesley Patience, MD  pramipexole (MIRAPEX) 0.5 MG tablet Take 0.5 mg by mouth at bedtime.   Yes [provider]  Probiotic Product (ALIGN PO) Take daily by mouth.   Yes [provider]  prochlorperazine (COMPAZINE) 10 MG tablet Take 1 tablet (10 mg total) by mouth every 6 (six) hours as needed for nausea or vomiting. 08/11/22  Yes Wesley Patience, MD  tamsulosin (FLOMAX) 0.4 MG CAPS capsule Take 0.4 mg by mouth daily.   Yes [provider]  Thiamine HCl (VITAMIN B1 PO) Take 1 tablet by mouth daily.   Yes [provider]  traMADol (ULTRAM) 50 MG tablet Take 50 mg by mouth 4 (four) times daily. SCHEDULED DOSES   Yes [provider]  triamcinolone cream (KENALOG) 0.1 % Apply 1 Application topically 2 (two) times daily. 10/06/22  Yes Wesley Patience, MD    Social History   Socioeconomic History   Marital status: Married    Spouse name: Not on file   Number of children: Not on file   Years of education: Not on file   Highest education level: Not on file  Occupational History   Not on file  Tobacco Use   Smoking status: Former    Current packs/day: 0.00    Types: Pipe, Cigarettes    Start date: 20    Quit date: 1977    Years since  quitting: 47.7   Smokeless tobacco: Never  Vaping Use   Vaping status: Never Used  Substance and Sexual Activity   Alcohol use: Not Currently    Comment: quit 03/26/2008   Drug use: Not Currently   Sexual activity: Not Currently  Other Topics Concern   Not on file  Social History Narrative   Not on file   Social Determinants of Health   Financial Resource Strain: Low Risk  (08/10/2022)   Received from [facility], [facility]   Overall Financial Resource Strain (CARDIA)    Difficulty of Paying Living Expenses: Not hard at all  Food Insecurity: No Food Insecurity (08/10/2022)   Received from [facility], [facility]   Hunger Vital Sign    Worried About Running Out of Food in the Last Year: Never true    Ran Out of Food in the Last Year: Never true  Transportation Needs: No Transportation Needs (08/10/2022)   Received from [facility], [facility]   [facility] - Transportation    In the past 12 months, has lack of transportation kept you from medical appointments or from getting medications?: No    Lack of Transportation (Non-Medical): No  Physical Activity: Not on file  Stress: Not on file  Social Connections: Not on file  Intimate Partner Violence: Not At Risk (08/02/2022)   Humiliation, Afraid, Rape, and Kick questionnaire    Fear of Current or Ex-Partner: No    Emotionally Abused: No    Physically Abused: No    Sexually Abused: No     Family History  Problem Relation Age of Onset   Ovarian cancer Mother    Heart attack Father    Emphysema Sister    Obesity Paternal Uncle     ROS: Otherwise negative unless mentioned in HPI  Physical Examination  Vitals:   11/06/2022 1830 11/05/2022 1853  BP: 135/83   Pulse: 85   Resp: (!) 24   Temp:  99 F (37.2 C)  Start on the third day after chemotherapy. 08/11/22  Yes Wesley Patience, MD  pramipexole (MIRAPEX) 0.5 MG tablet Take 0.5 mg by mouth at bedtime.   Yes [provider]  Probiotic Product (ALIGN PO) Take daily by mouth.   Yes [provider]  prochlorperazine (COMPAZINE) 10 MG tablet Take 1 tablet (10 mg total) by mouth every 6 (six) hours as needed for nausea or vomiting. 08/11/22  Yes Wesley Patience, MD  tamsulosin (FLOMAX) 0.4 MG CAPS capsule Take 0.4 mg by mouth daily.   Yes [provider]  Thiamine HCl (VITAMIN B1 PO) Take 1 tablet by mouth daily.   Yes [provider]  traMADol (ULTRAM) 50 MG tablet Take 50 mg by mouth 4 (four) times daily. SCHEDULED DOSES   Yes [provider]  triamcinolone cream (KENALOG) 0.1 % Apply 1 Application topically 2 (two) times daily. 10/06/22  Yes Wesley Patience, MD    Social History   Socioeconomic History   Marital status: Married    Spouse name: Not on file   Number of children: Not on file   Years of education: Not on file   Highest education level: Not on file  Occupational History   Not on file  Tobacco Use   Smoking status: Former    Current packs/day: 0.00    Types: Pipe, Cigarettes    Start date: 20    Quit date: 1977    Years since  quitting: 47.7   Smokeless tobacco: Never  Vaping Use   Vaping status: Never Used  Substance and Sexual Activity   Alcohol use: Not Currently    Comment: quit 03/26/2008   Drug use: Not Currently   Sexual activity: Not Currently  Other Topics Concern   Not on file  Social History Narrative   Not on file   Social Determinants of Health   Financial Resource Strain: Low Risk  (08/10/2022)   Received from [facility], [facility]   Overall Financial Resource Strain (CARDIA)    Difficulty of Paying Living Expenses: Not hard at all  Food Insecurity: No Food Insecurity (08/10/2022)   Received from [facility], [facility]   Hunger Vital Sign    Worried About Running Out of Food in the Last Year: Never true    Ran Out of Food in the Last Year: Never true  Transportation Needs: No Transportation Needs (08/10/2022)   Received from [facility], [facility]   [facility] - Transportation    In the past 12 months, has lack of transportation kept you from medical appointments or from getting medications?: No    Lack of Transportation (Non-Medical): No  Physical Activity: Not on file  Stress: Not on file  Social Connections: Not on file  Intimate Partner Violence: Not At Risk (08/02/2022)   Humiliation, Afraid, Rape, and Kick questionnaire    Fear of Current or Ex-Partner: No    Emotionally Abused: No    Physically Abused: No    Sexually Abused: No     Family History  Problem Relation Age of Onset   Ovarian cancer Mother    Heart attack Father    Emphysema Sister    Obesity Paternal Uncle     ROS: Otherwise negative unless mentioned in HPI  Physical Examination  Vitals:   11/06/2022 1830 11/05/2022 1853  BP: 135/83   Pulse: 85   Resp: (!) 24   Temp:  99 F (37.2 C)

## 2022-10-25 NOTE — ED Triage Notes (Signed)
Pt to ER via EMS from home.  Pt was headed to car for an appointment for CXR and to see CA MD.  Pt fell and was unable to et himself up so EMS was called.  On EMS arrival, they attempted to get pt up and he "almost went out on [place]".  EMS noted pt O2 on RA at 70%, pt does not wear O2 at baseline.  Pt arrives on 6L [place] sats inl ow 90s.  Pt has baseline dementia, hx lung CA

## 2022-10-25 NOTE — Telephone Encounter (Signed)
Wife called back and states Covid test is negative. They are on their way for chest xray and then will head over to Cancer center.

## 2022-10-25 NOTE — Plan of Care (Signed)
  Problem: Education: Goal: Knowledge of General Education information will improve Description: Including pain rating scale, medication(s)/side effects and non-pharmacologic comfort measures Outcome: Progressing   Problem: Clinical Measurements: Goal: Respiratory complications will improve Outcome: Progressing   Problem: Clinical Measurements: Goal: Cardiovascular complication will be avoided Outcome: Progressing   Problem: Activity: Goal: Risk for activity intolerance will decrease Outcome: Progressing   Problem: Elimination: Goal: Will not experience complications related to bowel motility Outcome: Progressing   Problem: Elimination: Goal: Will not experience complications related to urinary retention Outcome: Progressing   Problem: Pain Managment: Goal: General experience of comfort will improve Outcome: Progressing   Problem: Safety: Goal: Ability to remain free from injury will improve Outcome: Progressing

## 2022-10-25 NOTE — H&P (Addendum)
History and Physical   Wesley Young NWG:[id number] DOB: [birth year] DOA: 10/25/2022  PCP: Wesley Regulus, MD  Patient coming from: Home via EMS  I have personally briefly reviewed patient's old medical records in [facility] EMR.  Chief Concern: Shortness of breath  HPI: 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  Vitals in the ED showed temperature of 99.9, respiration rate of 15, heart rate of 100, blood pressure 156/85, SpO2 of 92% on 6 L nasal cannula.  Serum sodium is 133, potassium 3.7, chloride 99, bicarb 21, BUN of 17, serum creatinine 0.93, EGFR greater than 60, nonfasting blood glucose 212.  Lactic acid was 3.1 and on repeat was 1.3.  WBC 12.1, hemoglobin 8.9, platelets of 261.  ED treatment: Tramadol 50 mg p.o. one-time dose, cefepime 2 g IV, heparin per pharmacy, vancomycin per pharmacy. --------------------------- At bedside, he is able to tell me his name, age, location. He can identifiy his spoust at bedside.  In June of 2024, he reports that he coughed up blood.  Since then he has been diagnosed with lung cancer.  On Friday, 10/21/2022: Family and patient reports that patient was sleepy, progressively sleeping more. On Sunday, he kept saying to his spouse: 'I feel like I am dying'. Spouse. He started coughing more on Friday, non productive. He reports right leg pain, that started on Friday.   There is no report of fever, chills, chest pain, abdominal pain, dysuria, hematuria, diarrhea.  Social history: He lives at home with his spouse. He is a former tobacco user, quitting 40 years ago. He denies etoh, last drink was 14.5 years ago. He is retired and formerly was a Theatre stage manager.   ROS: Constitutional: no weight change, no fever ENT/Mouth: no sore throat, no  rhinorrhea Eyes: no eye pain, no vision changes Cardiovascular: no chest pain, no dyspnea,  no edema, no palpitations Respiratory: no cough, no sputum, no wheezing Gastrointestinal: no nausea, no vomiting, no diarrhea, no constipation Genitourinary: no urinary incontinence, no dysuria, no hematuria Musculoskeletal: no arthralgias, no myalgias Skin: no skin lesions, no pruritus, Neuro: + weakness, no loss of consciousness, no syncope Psych: no anxiety, no depression, + decrease appetite Heme/Lymph: no bruising, no bleeding  ED Course: Discussed with emergency medicine provider, patient requiring hospitalization for chief concerns of bilateral pulmonary embolism.  Assessment/Plan  Principal Problem:   Bilateral pulmonary embolism (HCC) Active Problems:   Squamous cell carcinoma lung (HCC)   Essential hypertension   Chronic pain   OSA on CPAP   Depression with anxiety   Lung neoplasm   Metastasis to brain (HCC)   Port-A-Cath in place   Assessment and Plan:  * Bilateral pulmonary embolism (HCC) Heparin per pharmacy ordered Complete echo ordered Vascular specialist has been consulted for consideration of thrombectomy N.p.o. after midnight  Squamous cell carcinoma lung [facility]) Outpatient follow-up with hematology/oncology as appropriate Home afatinib not resumed on admission  Essential hypertension Losartan 50 mg daily resumed  Chronic pain PDMP reviewed Tramadol regimen been resumed per discussion with spouse and patient at bedside  OSA on CPAP CPAP nightly ordered  Metastasis to brain [facility]) Per ED report, medical oncologist recommended to hold afatinib on admission  Lung neoplasm Home afatinib held on admission per oncology recommendation as patient may have superimposed pneumonia  Chart  reviewed.   DVT prophylaxis:  Code Status: None at this time Diet: N.p.o. after midnight Family Communication: Discussed with spouse at bedside Disposition Plan: Pending  course Consults called: Vascular specialist Admission status: PCU, inpatient  Past Medical History:  Diagnosis Date   Abnormal PSA    s/p post prostate biopsy in the past which was negative   Actinic keratosis 03/14/2007   L ant lat neck at base of neck - bx proven    Actinic keratosis 01/08/2014   R calf - bx proven    Alcoholism (HCC)    with prolonged hospitalization 2009 for withdrawal c/b aspiration pneumonia requiring vent   Anxiety    Atrial fibrillation and flutter (HCC)    pt denies afib.   B12 deficiency    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/2015   L upper back paraspinal - excision    Hx of squamous cell carcinoma of skin 2014   multiple sites   Hypertension    Impotence    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HCC)    Sleep apnea    Spinal stenosis    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   L forearm - excision    Squamous cell carcinoma of skin 12/16/2015   R dorsum hand    Squamous cell carcinoma of skin 10/02/2017   R dorsum hand    Varicose veins of both lower extremities    Past Surgical History:  Procedure Laterality Date   CATARACT EXTRACTION W/PHACO Left 03/12/2019   Procedure: CATARACT EXTRACTION PHACO AND  INTRAOCULAR LENS PLACEMENT (IOC) LEFT 5.27 00:38.6;  Surgeon: Galen Manila, MD;  Location: [facility];  Service: Ophthalmology;  Laterality: Left;  sleep apnea   CATARACT EXTRACTION W/PHACO Right 04/09/2019   Procedure: CATARACT EXTRACTION PHACO AND INTRAOCULAR LENS PLACEMENT (IOC) RIGHT 4.84 00:52.4;  Surgeon: Galen Manila, MD;  Location: [facility];  Service: Ophthalmology;  Laterality: Right;   colonoscopy with polypectomy  12/2016   COLONOSCOPY WITH PROPOFOL N/A 12/27/2016   Procedure: COLONOSCOPY WITH PROPOFOL;  Surgeon: Toledo, Boykin Nearing, MD;  Location: [facility];  Service: Endoscopy;  Laterality: N/A;   COLONOSCOPY WITH PROPOFOL N/A 03/23/2020   Procedure: COLONOSCOPY WITH PROPOFOL;  Surgeon: Toledo, Boykin Nearing, MD;  Location: [facility];  Service: Gastroenterology;  Laterality: N/A;   ESOPHAGOGASTRODUODENOSCOPY (EGD) WITH PROPOFOL N/A 09/21/2017   Procedure: ESOPHAGOGASTRODUODENOSCOPY (EGD) WITH PROPOFOL;  Surgeon: Christena Deem, MD;  Location: [facility];  Service: Endoscopy;  Laterality: N/A;   ESOPHAGOGASTRODUODENOSCOPY (EGD) WITH PROPOFOL N/A 03/23/2020   Procedure: ESOPHAGOGASTRODUODENOSCOPY (EGD) WITH PROPOFOL;  Surgeon: Norma Fredrickson, Boykin Nearing, MD;  Location: [facility];  Service: Gastroenterology;  Laterality: N/A;   melonoma removal     MOHS SURGERY     on top of head   PORTA CATH INSERTION N/A 08/18/2022   Procedure: PORTA CATH INSERTION;  Surgeon: Annice Needy, MD;  Location: [facility];  Service: Cardiovascular;  Laterality: N/A;   prostat biopsy x2     right deltoid resection in 1977     s/p resection of his right deltoid muscle in 1977     TONSILLECTOMY     TONSILLECTOMY AND ADENOIDECTOMY     VIDEO BRONCHOSCOPY WITH ENDOBRONCHIAL ULTRASOUND N/A 08/05/2022   Procedure: VIDEO BRONCHOSCOPY WITH ENDOBRONCHIAL ULTRASOUND;  Surgeon: Vida Rigger, MD;  Location: [facility];  Service: Thoracic;  Laterality: N/A;   Social  History:  reports that he quit smoking about 47 years ago. His smoking use included pipe and cigarettes. He started smoking about 67 years ago. He has never used smokeless tobacco. He reports that he does not currently use alcohol. He reports that he does not currently use drugs.  Allergies  Allergen Reactions   Ativan [Lorazepam]    Benadryl [Diphenhydramine]     Withdraw symptoms per patient   Crestor [Rosuvastatin Calcium]    Nsaids    Rapaflo [Silodosin]    Statins Other (See Comments)   Zetia [Ezetimibe]    Latex Rash    Bandaids only   Neosporin [Neomycin-Bacitracin Zn-Polymyx] Rash   Family History  Problem Relation Age of Onset   Ovarian cancer Mother    Heart attack Father    Emphysema Sister    Obesity Paternal Uncle    Family history: Family history reviewed and not pertinent.  Prior to Admission medications   Medication Sig Start Date End Date Taking? Authorizing Provider  acetaminophen (TYLENOL) 325 MG tablet Take 650 mg by mouth every 6 (six) hours as needed.    [provider]  afatinib dimaleate (GILOTRIF) 30 MG tablet Take 1 tablet (30 mg total) by mouth daily. Take on an empty stomach 1hr before or 2hrs after meals. 10/06/22   Rickard Patience, MD  Apoaequorin (PREVAGEN PO) Take by mouth daily.    [provider]  benzonatate (TESSALON) 200 MG capsule Take 1 capsule (200 mg total) by mouth 3 (three) times daily as needed for cough. 08/05/22   Arnetha Courser, MD  Calcium Citrate-Vitamin D (CITRACAL + D PO) Take by mouth 2 (two) times daily.    [provider]  clindamycin (CLINDAGEL) 1 % gel Apply topically 2 (two) times daily. 10/06/22   Rickard Patience, MD  cyanocobalamin (VITAMIN B12) 1000 MCG/ML injection Inject 1,000 mcg into the muscle every 30 (thirty) days. 11/18/21 11/13/22  [provider]  donepezil (ARICEPT) 5 MG tablet Take 5 mg by mouth in the morning. 07/05/22 01/01/23  [provider]  finasteride (PROSCAR) 5 MG tablet Take 1  tablet by mouth daily. 08/30/22   [provider]  KRILL OIL PO Take by mouth daily.    [provider]  lidocaine-prilocaine (EMLA) cream Apply to affected area once 08/11/22   Rickard Patience, MD  loperamide (IMODIUM) 2 MG capsule Take 1 capsule (2 mg total) by mouth See admin instructions. Initial: 4 mg,the 2 mg every 2 hours (4 mg every 4 hours at night)  maximum: 16 mg/day 09/03/22   Rickard Patience, MD  losartan (COZAAR) 50 MG tablet Take 50 mg by mouth daily. 06/21/22   [provider]  ondansetron (ZOFRAN) 8 MG tablet  Take 1 tablet (8 mg total) by mouth every 8 (eight) hours as needed for nausea or vomiting. Start on the third day after chemotherapy. Patient not taking: Reported on 08/16/2022 08/11/22   Rickard Patience, MD  pramipexole (MIRAPEX) 0.5 MG tablet Take 0.5 mg by mouth at bedtime.    [provider]  Probiotic Product (ALIGN PO) Take daily by mouth.    [provider]  prochlorperazine (COMPAZINE) 10 MG tablet Take 1 tablet (10 mg total) by mouth every 6 (six) hours as needed for nausea or vomiting. Patient not taking: Reported on 10/06/2022 08/11/22   Rickard Patience, MD  tamsulosin (FLOMAX) 0.4 MG CAPS capsule Take 0.4 mg by mouth daily.    [provider]  Thiamine HCl (VITAMIN B1 PO) Take 1 tablet by mouth daily.    [provider]  traMADol (ULTRAM) 50 MG tablet Take 50 mg every 6 (six) hours as needed by mouth.    [provider]  triamcinolone cream (KENALOG) 0.1 % Apply 1 Application topically 2 (two) times daily. 10/06/22   Rickard Patience, MD   Physical Exam: Vitals:   10/25/22 2028 10/25/22 2042 10/25/22 2311 10/25/22 2337  BP: (!) 150/82   (!) 156/90  Pulse: 76  85 89  Resp: 19  (!) 26 20  Temp: 97.7 F (36.5 C)   97.7 F (36.5 C)  TempSrc: Oral   Axillary  SpO2: 95%  98% 100%  Weight:  78.3 kg    Height:  (!) 6" (0.152 m)     Constitutional: appears frail, chronically ill, weak Eyes: PERRL, lids and conjunctivae normal ENMT:  Mucous membranes are moist. Posterior pharynx clear of any exudate or lesions. Age-appropriate dentition. Hearing appropriate Neck: normal, supple, no masses, no thyromegaly Respiratory: clear to auscultation bilaterally, no wheezing, no crackles. Normal respiratory effort. No accessory muscle use.  Cardiovascular: Regular rate and rhythm, no murmurs / rubs / gallops. No extremity edema. 2+ pedal pulses. No carotid bruits.  Abdomen: no tenderness, no masses palpated, no hepatosplenomegaly. Bowel sounds positive.  Musculoskeletal: no clubbing / cyanosis. No joint deformity upper and lower extremities. Good ROM, no contractures, no atrophy. Normal muscle tone.  Skin: no rashes, lesions, ulcers. No induration Neurologic: Sensation intact. Strength 5/5 in all 4.  Psychiatric: Patient exhibits signs of dementia, as patient requires multiple reminders and patient asked the same question over and over again. Alert and oriented x 3.  Flat affect  EKG: independently reviewed, showing sinus rhythm with rate of 98, QTc 371  Chest x-ray on Admission: I personally reviewed and I agree with radiologist reading as below.  CT Angio Chest PE W and/or Wo Contrast  Result Date: 10/25/2022 CLINICAL DATA:  active lung cancer, new shortness of breath with hypoxia, right leg swelling, concern for PE EXAM: CT ANGIOGRAPHY CHEST WITH CONTRAST TECHNIQUE: Multidetector CT imaging of the chest was performed using the standard protocol during bolus administration of intravenous contrast. Multiplanar CT image reconstructions and MIPs were obtained to evaluate the vascular anatomy. RADIATION DOSE REDUCTION: This exam was performed according to the departmental dose-optimization program which includes automated exposure control, adjustment of the mA and/or kV according to patient size and/or use of iterative reconstruction technique. CONTRAST:  75mL OMNIPAQUE IOHEXOL 350 MG/ML SOLN COMPARISON:  08/04/2022 FINDINGS: Cardiovascular:  SVC patent. Mild cardiomegaly with right-sided enlargement, mild RV dilatation. No pericardial effusion. Acute PE in right lower lobe pulmonary artery, extending into segmental branches, as well as smaller segmental and subsegmental emboli 2 right  upper and middle lobes. Subsegmental emboli in the left upper and lower lobes. Scattered coronary calcifications. Adequate contrast opacification of the thoracic aorta with no evidence of dissection, aneurysm, or stenosis. There is classic 3-vessel brachiocephalic arch anatomy without proximal stenosis. Scattered atheromatous plaque in the arch and descending thoracic segment. Mediastinum/Nodes: No mediastinal hematoma, mass, or adenopathy. Lungs/Pleura: Small left pleural effusion, new since previous. Extensive interstitial and airspace opacities throughout most of the left lung, new since previous. Cavitation in the previously identified posterior left upper lobe mass which abuts the hilum and mediastinum. Scattered ground-glass opacities in the medial and posterior right lower lobe are new since previous. Upper Abdomen: No acute findings. Musculoskeletal: No acute findings.  Right shoulder DJD. Review of the MIP images confirms the above findings. IMPRESSION: 1. Acute right lower lobar and bilateral peripheral pulmonary emboli, right greater than left. Critical Value/emergent results were called by telephone at the time of interpretation on 10/25/2022 at 4:16 pm to provider Dr Scotty Court, who verbally acknowledged these results. 2. Extensive interstitial and airspace opacities throughout most of the left lung, new since previous, with small left pleural effusion. 3. Cavitation in the previously identified posterior left upper lobe mass which abuts the hilum and mediastinum. 4.  Aortic Atherosclerosis (ICD10-I70.0). Electronically Signed   By: Corlis Leak M.D.   On: 10/25/2022 16:19   US Venous Img Lower Unilateral Right  Result Date: 10/25/2022 CLINICAL DATA:  Right lower  extremity pain and edema. History of lung cancer. History of previous DVT. Evaluate for acute or chronic DVT. EXAM: RIGHT LOWER EXTREMITY VENOUS DOPPLER ULTRASOUND TECHNIQUE: Gray-scale sonography with graded compression, as well as color Doppler and duplex ultrasound were performed to evaluate the lower extremity deep venous systems from the level of the common femoral vein and including the common femoral, femoral, profunda femoral, popliteal and calf veins including the posterior tibial, peroneal and gastrocnemius veins when visible. The superficial great saphenous vein was also interrogated. Spectral Doppler was utilized to evaluate flow at rest and with distal augmentation maneuvers in the common femoral, femoral and popliteal veins. COMPARISON:  None Available. FINDINGS: Contralateral Common Femoral Vein: Respiratory phasicity is normal and symmetric with the symptomatic side. No evidence of thrombus. Normal compressibility. Common Femoral Vein: No evidence of thrombus. Normal compressibility, respiratory phasicity and response to augmentation. Saphenofemoral Junction: There is mixed echogenic occlusive thrombus involving the saphenofemoral junction (image 14). Profunda Femoral Vein: No evidence of thrombus. Normal compressibility and flow on color Doppler imaging. Femoral Vein: No evidence of thrombus. Normal compressibility, respiratory phasicity and response to augmentation. Popliteal Vein: No evidence of thrombus. Normal compressibility, respiratory phasicity and response to augmentation. Calf Veins: There is hypoechoic occlusive thrombus involving one of the paired divisions of the right posterior tibial vein (images 35 and 36). The paired peroneal veins appear patent where imaged. Superficial Great Saphenous Vein: There is mixed echogenic occlusive thrombus throughout the interrogated course of the greater saphenous vein (images 7 through 13). Patient's palpable lump within the proximal aspect of the  right calf correlates with an occluded segment of the greater saphenous vein (image 12). Other Findings:  None. IMPRESSION: 1. The examination is positive for occlusive DVT involving one of the paired divisions of the right posterior tibial vein. There is no extension of this distal tibial DVT to the more proximal venous system of the right lower extremity. 2. Examination is positive for occlusive superficial thrombophlebitis involving the saphenofemoral junction (not extending to involve the common femoral vein) as well as  the interrogated course of the greater saphenous vein. Electronically Signed   By: Simonne Come M.D.   On: 10/25/2022 15:50   DG Chest Portable 1 View  Result Date: 10/25/2022 CLINICAL DATA:  Sepsis. EXAM: PORTABLE CHEST 1 VIEW COMPARISON:  X-ray 08/05/2022 FINDINGS: Right lung is grossly clear. Hyperinflation. Right IJ chest port with the tip along the central SVC above the right atrium. Small left effusion with increasing mid to lower lung opacities. No pneumothorax. Calcified aorta. Overlapping cardiac leads. Apical pleural thickening. Degenerative changes of the spine. Osteopenia IMPRESSION: Small left effusion with developing mid to lower lung opacity. Acute infiltrate is possible. Recommend follow-up. Chest port Electronically Signed   By: Karen Kays M.D.   On: 10/25/2022 15:01    Labs on Admission: I have personally reviewed following labs CBC: Recent Labs  Lab 10/25/22 1329  WBC 12.1*  NEUTROABS 10.6*  HGB 8.9*  HCT 27.5*  MCV 89.0  PLT 261   Basic Metabolic Panel: Recent Labs  Lab 10/25/22 1329  NA 133*  K 3.7  CL 99  CO2 21*  GLUCOSE 212*  BUN 17  CREATININE 0.93  CALCIUM 9.1   GFR: CrCl cannot be calculated (Unknown ideal weight.).  Liver Function Tests: Recent Labs  Lab 10/25/22 1329  AST 33  ALT 37  ALKPHOS 83  BILITOT 0.7  PROT 5.9*  ALBUMIN 2.7*   Coagulation Profile: Recent Labs  Lab 10/25/22 1329  INR 1.6*   Urine analysis:     Component Value Date/Time   COLORURINE YELLOW (A) 10/25/2022 1308   APPEARANCEUR CLEAR (A) 10/25/2022 1308   APPEARANCEUR Clear 11/20/2019 1137   LABSPEC 1.023 10/25/2022 1308   PHURINE 6.0 10/25/2022 1308   GLUCOSEU NEGATIVE 10/25/2022 1308   HGBUR SMALL (A) 10/25/2022 1308   BILIRUBINUR NEGATIVE 10/25/2022 1308   BILIRUBINUR Negative 11/20/2019 1137   KETONESUR NEGATIVE 10/25/2022 1308   PROTEINUR NEGATIVE 10/25/2022 1308   NITRITE NEGATIVE 10/25/2022 1308   LEUKOCYTESUR NEGATIVE 10/25/2022 1308   This document was prepared using Dragon Voice Recognition software and may include unintentional dictation errors.  Dr. Sedalia Muta [facility]  If 7PM-7AM, please contact overnight-coverage provider If 7AM-7PM, please contact day attending provider [email or web address]  10/26/2022, 12:41 AM

## 2022-10-25 NOTE — Telephone Encounter (Signed)
Wife called asking for patient to be seen today, He has not been eating or drinking for several days, is sleeping all the time, Has a very unsteady gait, having to be held up each time he is up. He complains of shortness of breath having to rest after just a few steps when up and she also reports that his breathing is very shallow and that he has been coughing. She feel he at a minimum needs IV fluids. Denies fever and states his temp has been around 94. Please advise. He completed his radiation therapy 10/19/22

## 2022-10-25 NOTE — Consult Note (Signed)
Pharmacy Consult Note - Anticoagulation  Pharmacy Consult for heparin Indication: pulmonary embolus  PATIENT MEASUREMENTS: Height: 6' (182.9 cm) Weight: 72.1 kg (159 lb) IBW/kg (Calculated) : 77.6 HEPARIN DW (KG): 72.1  VITAL SIGNS: Temp: 99.9 F (37.7 C) (09/03 1305) Temp Source: Oral (09/03 1305) BP: 156/84 (09/03 1400) Pulse Rate: 100 (09/03 1400)  Recent Labs    10/25/22 1329  HGB 8.9*  HCT 27.5*  PLT 261  LABPROT 18.9*  INR 1.6*  CREATININE 0.93    Estimated Creatinine Clearance: 60.3 mL/min (by C-G formula based on SCr of 0.93 mg/dL).  PAST MEDICAL HISTORY: Past Medical History:  Diagnosis Date   Abnormal PSA    s/p post prostate biopsy in the past which was negative   Actinic keratosis 03/14/2007   L ant lat neck at base of neck - bx proven    Actinic keratosis 01/08/2014   R calf - bx proven    Alcoholism (HCC)    with prolonged hospitalization 2009 for withdrawal c/b aspiration pneumonia requiring vent   Anxiety    Atrial fibrillation and flutter (HCC)    pt denies afib.   B12 deficiency    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 male with PMH dementia is presenting with pulmonary embolism. CT chest shows acute bilateral peripheral pulmonary embolic, right side greater than left. Also, extensive interstitial and airspace opacities in left lung, with small pleural effusion and possible cavitation in left upper lobe.  Patient is not on chronic anticoagulation per chart review. Pharmacy has been consulted to initiate and manage heparin intravenous infusion.  Hemoglobin today is 8.9, and baseline appears to be 11-12 from June 2024. Will continue to monitor.  Pertinent medications: No chronic AC PTA per chart review  Goal(s) of therapy: Heparin level 0.3 - 0.7 units/mL Monitor platelets by anticoagulation protocol: Yes   Baseline anticoagulation labs: Recent Labs    10/25/22 1329  INR 1.6*  HGB 8.9*  PLT 261  Date Time aPTT/HL Rate/Comment   PLAN: Give 5000 units bolus x1; then start heparin infusion at 1100 units/hour. Check heparin level in 8 hours, then daily once at least two levels are consecutively therapeutic. Monitor CBC daily while on heparin infusion.   Will M. Dareen Piano, PharmD Clinical Pharmacist 10/25/2022 5:01 PM

## 2022-10-25 NOTE — Assessment & Plan Note (Signed)
-  CPAP nightly ordered 

## 2022-10-25 NOTE — H&P (View-Only) (Signed)
Hospital Consult    Reason for Consult:  Pulmonary Embolism Requesting Physician:  Dr Donetta Potts wells MD MRN #:  [id number]  History of Present Illness: This is a 82 y.o. male with squamous cell carcinoma of the lung, metastatic cancer to the brain who presents today for shortness of breath. Wife notes patient has been undergoing radiation as well as medical therapy for his lung cancer with metastatic lesions to the brain. Over the past 5 days he has had worsening weakness and was unable to get himself off the ground today. He notes being short of breath over the past couple of days.   Past Medical History:  Diagnosis Date   Abnormal PSA    s/p post prostate biopsy in the past which was negative   Actinic keratosis 03/14/2007   L ant lat neck at base of neck - bx proven    Actinic keratosis 01/08/2014   R calf - bx proven    Alcoholism (HCC)    with prolonged hospitalization 2009 for withdrawal c/b aspiration pneumonia requiring vent   Anxiety    Atrial fibrillation and flutter (HCC)    pt denies afib.   B12 deficiency    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/2015   L upper back paraspinal - excision    Hx of squamous cell carcinoma of skin 2014    multiple sites   Hypertension    Impotence    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HCC)    Sleep apnea    Spinal stenosis    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   L forearm - excision    Squamous cell carcinoma of skin 12/16/2015   R dorsum hand    Squamous cell carcinoma of skin 10/02/2017   R dorsum hand    Varicose veins of both lower extremities     Past Surgical History:  Procedure Laterality Date   CATARACT EXTRACTION W/PHACO Left 03/12/2019   Procedure: CATARACT EXTRACTION PHACO AND INTRAOCULAR LENS PLACEMENT (IOC) LEFT 5.27 00:38.6;  Surgeon: Galen Manila, MD;  Location: [facility];  Service: Ophthalmology;  Laterality: Left;  sleep apnea   CATARACT EXTRACTION W/PHACO Right 04/09/2019   Procedure: CATARACT EXTRACTION PHACO AND INTRAOCULAR LENS PLACEMENT (IOC) RIGHT 4.84 00:52.4;  Surgeon: Galen Manila, MD;  Location: [facility];  Service: Ophthalmology;  Laterality: Right;   colonoscopy with polypectomy  12/2016   COLONOSCOPY WITH PROPOFOL N/A 12/27/2016   Procedure: COLONOSCOPY WITH PROPOFOL;  Surgeon: Toledo, Boykin Nearing, MD;  Location: [facility];  Service: Endoscopy;  Laterality: N/A;   COLONOSCOPY WITH PROPOFOL N/A 03/23/2020  Procedure: COLONOSCOPY WITH PROPOFOL;  Surgeon: Toledo, Boykin Nearing, MD;  Location: [facility];  Service: Gastroenterology;  Laterality: N/A;   ESOPHAGOGASTRODUODENOSCOPY (EGD) WITH PROPOFOL N/A 09/21/2017   Procedure: ESOPHAGOGASTRODUODENOSCOPY (EGD) WITH PROPOFOL;  Surgeon: Christena Deem, MD;  Location: [facility];  Service: Endoscopy;  Laterality: N/A;   ESOPHAGOGASTRODUODENOSCOPY (EGD) WITH PROPOFOL N/A 03/23/2020   Procedure: ESOPHAGOGASTRODUODENOSCOPY (EGD) WITH PROPOFOL;  Surgeon: Toledo, Boykin Nearing, MD;  Location: [facility];  Service: Gastroenterology;  Laterality: N/A;   melonoma removal     MOHS SURGERY     on top of head   PORTA CATH INSERTION N/A 08/18/2022   Procedure: PORTA CATH  INSERTION;  Surgeon: Annice Needy, MD;  Location: [facility];  Service: Cardiovascular;  Laterality: N/A;   prostat biopsy x2     right deltoid resection in 1977     s/p resection of his right deltoid muscle in 1977     TONSILLECTOMY     TONSILLECTOMY AND ADENOIDECTOMY     VIDEO BRONCHOSCOPY WITH ENDOBRONCHIAL ULTRASOUND N/A 08/05/2022   Procedure: VIDEO BRONCHOSCOPY WITH ENDOBRONCHIAL ULTRASOUND;  Surgeon: Vida Rigger, MD;  Location: [facility];  Service: Thoracic;  Laterality: N/A;    Allergies  Allergen Reactions   Ativan [Lorazepam]    Benadryl [Diphenhydramine]     Withdraw symptoms per patient   Crestor [Rosuvastatin Calcium]    Nsaids    Rapaflo [Silodosin]    Statins Other (See Comments)   Zetia [Ezetimibe]    Latex Rash    Bandaids only   Neosporin [Neomycin-Bacitracin Zn-Polymyx] Rash    Prior to Admission medications   Medication Sig Start Date End Date Taking? Authorizing Provider  acetaminophen (TYLENOL) 325 MG tablet Take 650 mg by mouth every 6 (six) hours as needed.   Yes [provider]  afatinib dimaleate (GILOTRIF) 30 MG tablet Take 1 tablet (30 mg total) by mouth daily. Take on an empty stomach 1hr before or 2hrs after meals. 10/06/22  Yes Rickard Patience, MD  Apoaequorin (PREVAGEN PO) Take by mouth daily.   Yes [provider]  benzonatate (TESSALON) 200 MG capsule Take 1 capsule (200 mg total) by mouth 3 (three) times daily as needed for cough. 08/05/22  Yes Arnetha Courser, MD  Calcium Citrate-Vitamin D (CITRACAL + D PO) Take by mouth 2 (two) times daily.   Yes [provider]  clindamycin (CLINDAGEL) 1 % gel Apply topically 2 (two) times daily. 10/06/22  Yes Rickard Patience, MD  cyanocobalamin (VITAMIN B12) 1000 MCG/ML injection Inject 1,000 mcg into the muscle every 30 (thirty) days. 11/18/21 11/13/22 Yes [provider]  donepezil (ARICEPT) 5 MG tablet Take 5 mg by mouth in the morning. 07/05/22 01/01/23 Yes [provider]  finasteride (PROSCAR) 5 MG tablet Take 1 tablet by mouth daily. 08/30/22  Yes [provider]  KRILL OIL PO Take by mouth daily.   Yes [provider]  lidocaine-prilocaine (EMLA) cream Apply to affected area once 08/11/22  Yes Rickard Patience, MD  loperamide (IMODIUM) 2 MG capsule Take 1 capsule (2 mg total) by mouth See admin instructions. Initial: 4 mg,the 2 mg every 2 hours (4 mg every 4 hours at night)  maximum: 16 mg/day 09/03/22  Yes Rickard Patience, MD  losartan (COZAAR) 50 MG tablet Take 50 mg by mouth daily. 06/21/22  Yes [provider]  ondansetron (ZOFRAN) 8 MG tablet Take 1 tablet (8 mg total) by mouth every 8 (eight) hours as needed for nausea or vomiting.  Start on the third day after chemotherapy. 08/11/22  Yes Rickard Patience, MD  pramipexole (MIRAPEX) 0.5 MG tablet Take 0.5 mg by mouth at bedtime.   Yes [provider]  Probiotic Product (ALIGN PO) Take daily by mouth.   Yes [provider]  prochlorperazine (COMPAZINE) 10 MG tablet Take 1 tablet (10 mg total) by mouth every 6 (six) hours as needed for nausea or vomiting. 08/11/22  Yes Rickard Patience, MD  tamsulosin (FLOMAX) 0.4 MG CAPS capsule Take 0.4 mg by mouth daily.   Yes [provider]  Thiamine HCl (VITAMIN B1 PO) Take 1 tablet by mouth daily.   Yes [provider]  traMADol (ULTRAM) 50 MG tablet Take 50 mg by mouth 4 (four) times daily. SCHEDULED DOSES   Yes [provider]  triamcinolone cream (KENALOG) 0.1 % Apply 1 Application topically 2 (two) times daily. 10/06/22  Yes Rickard Patience, MD    Social History   Socioeconomic History   Marital status: Married    Spouse name: Not on file   Number of children: Not on file   Years of education: Not on file   Highest education level: Not on file  Occupational History   Not on file  Tobacco Use   Smoking status: Former    Current packs/day: 0.00    Types: Pipe, Cigarettes    Start date: 20    Quit date: 1977    Years since  quitting: 47.7   Smokeless tobacco: Never  Vaping Use   Vaping status: Never Used  Substance and Sexual Activity   Alcohol use: Not Currently    Comment: quit 03/26/2008   Drug use: Not Currently   Sexual activity: Not Currently  Other Topics Concern   Not on file  Social History Narrative   Not on file   Social Determinants of Health   Financial Resource Strain: Low Risk  (08/10/2022)   Received from [facility], [facility]   Overall Financial Resource Strain (CARDIA)    Difficulty of Paying Living Expenses: Not hard at all  Food Insecurity: No Food Insecurity (08/10/2022)   Received from [facility], [facility]   Hunger Vital Sign    Worried About Running Out of Food in the Last Year: Never true    Ran Out of Food in the Last Year: Never true  Transportation Needs: No Transportation Needs (08/10/2022)   Received from [facility], [facility]   [facility] - Transportation    In the past 12 months, has lack of transportation kept you from medical appointments or from getting medications?: No    Lack of Transportation (Non-Medical): No  Physical Activity: Not on file  Stress: Not on file  Social Connections: Not on file  Intimate Partner Violence: Not At Risk (08/02/2022)   Humiliation, Afraid, Rape, and Kick questionnaire    Fear of Current or Ex-Partner: No    Emotionally Abused: No    Physically Abused: No    Sexually Abused: No     Family History  Problem Relation Age of Onset   Ovarian cancer Mother    Heart attack Father    Emphysema Sister    Obesity Paternal Uncle     ROS: Otherwise negative unless mentioned in HPI  Physical Examination  Vitals:   10/25/22 1830 10/25/22 1853  BP: 135/83   Pulse: 85   Resp: (!) 24   Temp:  99 F (37.2 C)  SpO2: 98%    Body mass index is 21.56 kg/m.  General:  WDWN in NAD Gait: Not observed HENT: WNL,  normocephalic Pulmonary: labored increased work of breathing, with rhonchi scattered throughout more noticeable on the left side.  rhonchi,  wheezing Cardiac: regular, without  Murmurs, rubs or gallops; without carotid bruits Abdomen: Positive bowel sounds throughout, soft, NT/ND, no masses Skin: without rashes Vascular Exam/Pulses: Palpable pulses throughout Extremities: without ischemic changes, without Gangrene , without cellulitis; without open wounds;  Musculoskeletal: no cyanosis or edema. Patient with full ROM  Neurologic: A&O X 3;  No focal weakness or paresthesias are detected; speech is fluent/normal Psychiatric:  The pt has Normal affect. Lymph:  Unremarkable  CBC    Component Value Date/Time   WBC 12.1 (H) 10/25/2022 1329   RBC 3.09 (L) 10/25/2022 1329   HGB 8.9 (L) 10/25/2022 1329   HGB 9.9 (L) 10/06/2022 1026   HCT 27.5 (L) 10/25/2022 1329   PLT 261 10/25/2022 1329   PLT 283 10/06/2022 1026   MCV 89.0 10/25/2022 1329   MCH 28.8 10/25/2022 1329   MCHC 32.4 10/25/2022 1329   RDW 13.2 10/25/2022 1329   LYMPHSABS 0.1 (L) 10/25/2022 1329   MONOABS 1.0 10/25/2022 1329   EOSABS 0.3 10/25/2022 1329   BASOSABS 0.0 10/25/2022 1329    BMET    Component Value Date/Time   NA 133 (L) 10/25/2022 1329   K 3.7 10/25/2022 1329   CL 99 10/25/2022 1329   CO2 21 (L) 10/25/2022 1329   GLUCOSE 212 (H) 10/25/2022 1329   BUN 17 10/25/2022 1329   CREATININE 0.93 10/25/2022 1329   CREATININE 0.69 10/06/2022 1026   CALCIUM 9.1 10/25/2022 1329   GFRNONAA >60 10/25/2022 1329   GFRNONAA >60 10/06/2022 1026   GFRAA >60 08/21/2019 1132    COAGS: Lab Results  Component Value Date   INR 1.6 (H) 10/25/2022     Non-Invasive Vascular Imaging:   EXAM:10/25/22 CT ANGIOGRAPHY CHEST WITH CONTRAST   TECHNIQUE: Multidetector CT imaging of the chest was performed using the standard protocol during bolus administration of intravenous contrast. Multiplanar CT image reconstructions and  MIPs were obtained to evaluate the vascular anatomy.   RADIATION DOSE REDUCTION: This exam was performed according to the departmental dose-optimization program which includes automated exposure control, adjustment of the mA and/or kV according to patient size and/or use of iterative reconstruction technique.   CONTRAST:  75mL OMNIPAQUE IOHEXOL 350 MG/ML SOLN   COMPARISON:  08/04/2022   FINDINGS: Cardiovascular: SVC patent. Mild cardiomegaly with right-sided enlargement, mild RV dilatation. No pericardial effusion. Acute PE in right lower lobe pulmonary artery, extending into segmental branches, as well as smaller segmental and subsegmental emboli 2 right upper and middle lobes. Subsegmental emboli in the left upper and lower lobes.   Scattered coronary calcifications. Adequate contrast opacification of the thoracic aorta with no evidence of dissection, aneurysm, or stenosis. There is classic 3-vessel brachiocephalic arch anatomy without proximal stenosis. Scattered atheromatous plaque in the arch and descending thoracic segment.   Mediastinum/Nodes: No mediastinal hematoma, mass, or adenopathy.   Lungs/Pleura: Small left pleural effusion, new since previous. Extensive interstitial and airspace opacities throughout most of the left lung, new since previous. Cavitation in the previously identified posterior left upper lobe mass which abuts the hilum and mediastinum. Scattered ground-glass opacities in the medial and posterior right lower lobe are new since previous.   Upper Abdomen: No acute findings.   Musculoskeletal: No acute findings.  Right shoulder DJD.  Review of the MIP images confirms the above findings.   IMPRESSION: 1. Acute right lower lobar and bilateral peripheral pulmonary emboli, right greater than left. Critical Value/emergent results were called by telephone at the time of interpretation on 10/25/2022 at 4:16 pm to provider Dr Scotty Court, who verbally  acknowledged these results.   2. Extensive interstitial and airspace opacities throughout most of the left lung, new since previous, with small left pleural effusion. 3. Cavitation in the previously identified posterior left upper lobe mass which abuts the hilum and mediastinum. 4.  Aortic Atherosclerosis (ICD10-I70.0).  Statin:  No. Beta Blocker:  No. Aspirin:  No. ACEI:  No. ARB:  Yes.   CCB use:  No Other antiplatelets/anticoagulants:  No.    ASSESSMENT/PLAN: This is a 82 y.o. male  with squamous cell carcinoma of the lung, metastatic cancer to the brain who presents today for shortness of breath. Wife notes patient has been undergoing radiation as well as medical therapy for his lung cancer with metastatic lesions to the brain. Upon work up patient underwent CTA of the chest revealing bilateral pulmonary emboli with a known left upper lobe carcinoma. Vascular surgery was consulted to evaluate patients pulmonary embolisms.   Vascular surgery plans on taking the patient to the vascular lab for a pulmonary thrombectomy tomorrow on 10/26/22. I discussed in detail with the patient the procedure, benefits, risks and complications. He verbalizes his understanding and wishes to proceed as soon as possible. I answered all his questions today. He will be made NPO after midnight for procedure tomorrow.    -I discussed the plan in detail with Dr. [name] MD and he agrees with the plan.    Marcie Bal [facility] 10/25/2022 7:29 PM

## 2022-10-25 NOTE — Progress Notes (Addendum)
Pt states takes Pramipexole for restless legs but not order on MAR. MD Para March made aware. Will contineu to monitor.  Update 2058: MD Para March placed order. Will continue to monitor.  Update 0336: MD Para March at bedside. Will continue to monitor.

## 2022-10-25 NOTE — ED Provider Notes (Signed)
[facility] Provider Note    Event Date/Time   First MD Initiated Contact with Patient 10/25/22 1320     (approximate)   History   Fall, Near Syncope, and hypoxia   HPI Wesley Young is a 82 y.o. male with squamous cell carcinoma of the lung, metastatic cancer to the brain who presents today for shortness of breath.  Wife notes patient has been undergoing radiation as well as medical therapy for his lung cancer with metastatic lesions to the brain.  Over the past 5 days he has had worsening weakness and was unable to get himself off the ground today.  He notes being short of breath over the past couple of days.  He was found by EMS to be hypoxic into the 70s requiring 6 L nasal cannula.  Patient notes a productive cough with clear sputum.  Denies fever or chills.  Notes nausea and poor p.o. intake as well.  Had a couple episodes of diarrhea but that is resolved.  Also noting some swelling and tenderness on his right leg.     Physical Exam   Triage Vital Signs: ED Triage Vitals  Encounter Vitals Group     BP 10/25/22 1305 125/63     Systolic BP Percentile --      Diastolic BP Percentile --      Pulse Rate 10/25/22 1305 97     Resp 10/25/22 1305 (!) 26     Temp 10/25/22 1305 99.9 F (37.7 C)     Temp Source 10/25/22 1305 Oral     SpO2 10/25/22 1255 (!) 70 %     Weight 10/25/22 1301 159 lb (72.1 kg)     Height 10/25/22 1301 6' (1.829 m)     Head Circumference --      Peak Flow --      Pain Score 10/25/22 1300 0     Pain Loc --      Pain Education --      Exclude from Growth Chart --     Most recent vital signs: Vitals:   10/25/22 1330 10/25/22 1400  BP: (!) 152/90 (!) 156/84  Pulse: (!) 103 100  Resp: (!) 25 15  Temp:    SpO2: 98% 98%   Physical Exam: I have reviewed the vital signs and nursing notes. General: Awake, alert, no acute distress.  Nontoxic appearing. Head:  Atraumatic, normocephalic.   ENT:  EOM intact, PERRL. Oral mucosa is  pink and moist with no lesions. Neck: Neck is supple with full range of motion, No meningeal signs. Cardiovascular:  RRR, No murmurs. Peripheral pulses palpable and equal bilaterally. Respiratory:  Symmetrical chest wall expansion.  Rhonchi noted along left-sided lung fields.  Mildly tachypneic.  Increased work of breathing. Musculoskeletal:  No cyanosis or edema. Moving extremities with full ROM Abdomen:  Soft, nontender, nondistended. Neuro:  GCS 15, moving all four extremities, interacting appropriately. Speech clear. Psych:  Calm, appropriate.   Skin:  Warm, dry, no rash.     ED Results / Procedures / Treatments   Labs (all labs ordered are listed, but only abnormal results are displayed) Labs Reviewed  COMPREHENSIVE METABOLIC PANEL - Abnormal; Notable for the following components:      Result Value   Sodium 133 (*)    CO2 21 (*)    Glucose, Bld 212 (*)    Total Protein 5.9 (*)    Albumin 2.7 (*)    All other components within normal limits  LACTIC ACID, PLASMA - Abnormal; Notable for the following components:   Lactic Acid, Venous 3.1 (*)    All other components within normal limits  CBC WITH DIFFERENTIAL/PLATELET - Abnormal; Notable for the following components:   WBC 12.1 (*)    RBC 3.09 (*)    Hemoglobin 8.9 (*)    HCT 27.5 (*)    Neutro Abs 10.6 (*)    Lymphs Abs 0.1 (*)    All other components within normal limits  PROTIME-INR - Abnormal; Notable for the following components:   Prothrombin Time 18.9 (*)    INR 1.6 (*)    All other components within normal limits  CULTURE, BLOOD (ROUTINE X 2)  CULTURE, BLOOD (ROUTINE X 2)  PROCALCITONIN  LACTIC ACID, PLASMA  URINALYSIS, W/ REFLEX TO CULTURE (INFECTION SUSPECTED)  HEPARIN LEVEL (UNFRACTIONATED)  BASIC METABOLIC PANEL     EKG My EKG interpretation: Rate of 98, normal sinus rhythm.  No acute ST elevations or depressions   RADIOLOGY Chest x-ray showing consolidation along the left lung concerning for acute  infection superimposed on top of known cancer.  CTA chest pending at time of signout.   PROCEDURES:  Critical Care performed: Yes, see critical care procedure note(s)  .Critical Care  Performed by: Janith Lima, MD Authorized by: Janith Lima, MD   Critical care provider statement:    Critical care time (minutes):  35   Critical care time was exclusive of:  Separately billable procedures and treating other patients   Critical care was necessary to treat or prevent imminent or life-threatening deterioration of the following conditions:  Respiratory failure   Critical care was time spent personally by me on the following activities:  Development of treatment plan with patient or surrogate, discussions with consultants, evaluation of patient's response to treatment, examination of patient, ordering and review of laboratory studies, ordering and review of radiographic studies, ordering and performing treatments and interventions, pulse oximetry, re-evaluation of patient's condition and review of old charts    MEDICATIONS ORDERED IN ED: Medications  morphine (PF) 4 MG/ML injection 4 mg (4 mg Intravenous Not Given 10/25/22 1502)  vancomycin (VANCOREADY) IVPB 1500 mg/300 mL (has no administration in time range)  heparin ADULT infusion 100 units/mL (25000 units/223mL) (has no administration in time range)  heparin bolus via infusion 5,000 Units (has no administration in time range)  acetaminophen (TYLENOL) tablet 650 mg (has no administration in time range)    Or  acetaminophen (TYLENOL) suppository 650 mg (has no administration in time range)  ondansetron (ZOFRAN) tablet 4 mg (has no administration in time range)    Or  ondansetron (ZOFRAN) injection 4 mg (has no administration in time range)  senna-docusate (Senokot-S) tablet 1 tablet (has no administration in time range)  lactated ringers bolus 1,000 mL (0 mLs Intravenous Stopped 10/25/22 1502)  iohexol (OMNIPAQUE) 350 MG/ML injection 80  mL (75 mLs Intravenous Contrast Given 10/25/22 1423)  traMADol (ULTRAM) tablet 50 mg (50 mg Oral Given 10/25/22 1531)  ceFEPIme (MAXIPIME) 2 g in sodium chloride 0.9 % 100 mL IVPB (2 g Intravenous New Bag/Given 10/25/22 1535)     IMPRESSION / MDM / ASSESSMENT AND PLAN / ED COURSE  I reviewed the triage vital signs and the nursing notes.                              Differential diagnosis includes, but is not limited to, pneumonia, COVID, flu, RSV,  pulmonary embolism, decompensated heart failure.  Patient's presentation is most consistent with acute presentation with potential threat to life or bodily function.  Patient is an 82 year old male with known lung cancer and metastatic lesions to the brain presenting today for profound fatigue and shortness of breath.  Patient hypoxic on arrival here requiring 6 L nasal cannula to get oxygen sats greater than 90%.  Lung exam notable for crackles in the left-sided lung fields.  Patient initially given 1 L of fluids for further evaluation.  Chest x-ray with concern for possible pneumonia.  Lactic elevated at 3.1.  CMP notable for slightly low bicarb but otherwise laboratory workup is reassuring at this time.  Given active cancer at this time associated with shortness of breath, will follow this up with CTA chest to rule out pulmonary embolism as well as ultrasound of the right lower extremity concerning for possible blood clot.  Patient was given initial dose of cefepime and vancomycin given concerns for sepsis secondary to pneumonia.  Patient was signed out to oncoming provider while awaiting results of CTA chest and duplex of right lower extremity.  He will then be admitted to hospitalist for further care.  The patient is on the cardiac monitor to evaluate for evidence of arrhythmia and/or significant heart rate changes. Clinical Course as of 10/25/22 1809  Tue Oct 25, 2022  1415 Comprehensive metabolic panel(!) Mild hypocarbia present [DW]  1420 Lactic  Acid, Venous(!!): 3.1 [DW]  1532 Spoke with patient's oncologist Dr. Cathie Hoops who recommends if pneumonia confirmed on CT then would recommending stopping Afatinib. Patient signed out pending results of CTA chest before admission [DW]    Clinical Course User Index [DW] Janith Lima, MD     FINAL CLINICAL IMPRESSION(S) / ED DIAGNOSES   Final diagnoses:  Acute respiratory failure with hypoxia (HCC)  Other acute pulmonary embolism without acute cor pulmonale (HCC)     Rx / DC Orders   ED Discharge Orders     None        Note:  This document was prepared using Dragon voice recognition software and may include unintentional dictation errors.   Janith Lima, MD 10/25/22 [phone]

## 2022-10-26 ENCOUNTER — Other Ambulatory Visit (HOSPITAL_COMMUNITY): Payer: Self-pay

## 2022-10-26 ENCOUNTER — Encounter: Admission: EM | Disposition: E | Payer: Medicare Other | Source: Home / Self Care | Attending: Internal Medicine

## 2022-10-26 DIAGNOSIS — I2699 Other pulmonary embolism without acute cor pulmonale: Secondary | ICD-10-CM

## 2022-10-26 HISTORY — PX: PULMONARY THROMBECTOMY: CATH118295

## 2022-10-26 LAB — BASIC METABOLIC PANEL
Anion gap: 10 (ref 5–15)
BUN: 14 mg/dL (ref 8–23)
CO2: 23 mmol/L (ref 22–32)
Calcium: 8.8 mg/dL — ABNORMAL LOW (ref 8.9–10.3)
Chloride: 102 mmol/L (ref 98–111)
Creatinine, Ser: 0.57 mg/dL — ABNORMAL LOW (ref 0.61–1.24)
GFR, Estimated: >60 mL/min (ref >60)
Glucose, Bld: 119 mg/dL — ABNORMAL HIGH (ref 70–99)
Potassium: 3.7 mmol/L (ref 3.5–5.1)
Sodium: 135 mmol/L (ref 135–145)

## 2022-10-26 LAB — HEPARIN LEVEL (UNFRACTIONATED)
Heparin Unfractionated: 0.1 [IU]/mL — ABNORMAL LOW (ref 0.30–0.70)
Heparin Unfractionated: 0.42 [IU]/mL (ref 0.30–0.70)

## 2022-10-26 LAB — ECHOCARDIOGRAM COMPLETE
Area-P 1/2: 3.85 cm2
Height: 72 in
P 1/2 time: 447 ms
S' Lateral: 3.3 cm
Weight: 2544 [oz_av]

## 2022-10-26 LAB — GLUCOSE, CAPILLARY: Glucose-Capillary: 112 mg/dL — ABNORMAL HIGH (ref 70–99)

## 2022-10-26 SURGERY — PULMONARY THROMBECTOMY
Anesthesia: Moderate Sedation | Laterality: Bilateral

## 2022-10-26 MED ORDER — CEFAZOLIN SODIUM-DEXTROSE 2-4 GM/100ML-% IV SOLN
2.0000 g | INTRAVENOUS | Status: AC
  Administered 2022-10-26: 2 g via INTRAVENOUS

## 2022-10-26 MED ORDER — ZIPRASIDONE MESYLATE 20 MG IM SOLR
10.0000 mg | Freq: Four times a day (QID) | INTRAMUSCULAR | Status: DC | PRN
  Administered 2022-10-27: 10 mg via INTRAMUSCULAR
  Filled 2022-10-26 (×2): qty 20

## 2022-10-26 MED ORDER — MIDAZOLAM HCL 2 MG/ML PO SYRP: 8.0000 mg | ORAL_SOLUTION | Freq: Once | ORAL | Status: DC | PRN

## 2022-10-26 MED ORDER — LIDOCAINE HCL (PF) 1 % IJ SOLN
INTRAMUSCULAR | Status: DC | PRN
  Administered 2022-10-26: 10 mL

## 2022-10-26 MED ORDER — SODIUM CHLORIDE 0.9 % IV SOLN
2.0000 g | Freq: Three times a day (TID) | INTRAVENOUS | Status: AC
  Administered 2022-10-26 – 2022-10-29 (×10): 2 g via INTRAVENOUS
  Filled 2022-10-26 (×11): qty 12.5

## 2022-10-26 MED ORDER — LOSARTAN POTASSIUM 50 MG PO TABS
50.0000 mg | ORAL_TABLET | Freq: Every day | ORAL | Status: DC
  Administered 2022-10-26 – 2022-10-28 (×3): 50 mg via ORAL
  Filled 2022-10-26 (×3): qty 1

## 2022-10-26 MED ORDER — FAMOTIDINE 20 MG PO TABS: 40.0000 mg | ORAL_TABLET | Freq: Once | ORAL | Status: DC | PRN

## 2022-10-26 MED ORDER — ENSURE ENLIVE PO LIQD
237.0000 mL | Freq: Two times a day (BID) | ORAL | Status: DC
  Administered 2022-10-27 – 2022-10-29 (×6): 237 mL via ORAL

## 2022-10-26 MED ORDER — DONEPEZIL HCL 5 MG PO TABS
5.0000 mg | ORAL_TABLET | Freq: Every day | ORAL | Status: DC
  Administered 2022-10-26 – 2022-10-29 (×4): 5 mg via ORAL
  Filled 2022-10-26 (×5): qty 1

## 2022-10-26 MED ORDER — DIPHENHYDRAMINE HCL 50 MG/ML IJ SOLN: 50.0000 mg | Freq: Once | INTRAMUSCULAR | Status: DC | PRN

## 2022-10-26 MED ORDER — FENTANYL CITRATE (PF) 100 MCG/2ML IJ SOLN: 12.5000 ug | Freq: Once | INTRAMUSCULAR | Status: DC | PRN

## 2022-10-26 MED ORDER — PANTOPRAZOLE SODIUM 40 MG PO TBEC
40.0000 mg | DELAYED_RELEASE_TABLET | Freq: Every day | ORAL | Status: DC
  Administered 2022-10-27 – 2022-10-29 (×3): 40 mg via ORAL
  Filled 2022-10-26 (×4): qty 1

## 2022-10-26 MED ORDER — TAMSULOSIN HCL 0.4 MG PO CAPS
0.4000 mg | ORAL_CAPSULE | Freq: Every day | ORAL | Status: DC
  Administered 2022-10-26 – 2022-10-29 (×4): 0.4 mg via ORAL
  Filled 2022-10-26 (×4): qty 1

## 2022-10-26 MED ORDER — HYDROMORPHONE HCL 1 MG/ML IJ SOLN: 1.0000 mg | Freq: Once | INTRAMUSCULAR | Status: DC | PRN

## 2022-10-26 MED ORDER — HEPARIN (PORCINE) IN NACL 2000-0.9 UNIT/L-% IV SOLN
INTRAVENOUS | Status: DC | PRN
  Administered 2022-10-26: 1000 mL

## 2022-10-26 MED ORDER — ONDANSETRON HCL 4 MG/2ML IJ SOLN: 4.0000 mg | Freq: Four times a day (QID) | INTRAMUSCULAR | Status: DC | PRN

## 2022-10-26 MED ORDER — HEPARIN BOLUS VIA INFUSION
2300.0000 [IU] | Freq: Once | INTRAVENOUS | Status: AC
  Administered 2022-10-26: 2300 [IU] via INTRAVENOUS
  Filled 2022-10-26: qty 2300

## 2022-10-26 MED ORDER — SODIUM CHLORIDE 0.9 % IV SOLN: INTRAVENOUS | Status: DC

## 2022-10-26 MED ORDER — HEPARIN SODIUM (PORCINE) 1000 UNIT/ML IJ SOLN
INTRAMUSCULAR | Status: DC | PRN
  Administered 2022-10-26: 3000 [IU] via INTRAVENOUS

## 2022-10-26 MED ORDER — FENTANYL CITRATE (PF) 100 MCG/2ML IJ SOLN
INTRAMUSCULAR | Status: AC
  Filled 2022-10-26: qty 2

## 2022-10-26 MED ORDER — BENZONATATE 100 MG PO CAPS
200.0000 mg | ORAL_CAPSULE | Freq: Three times a day (TID) | ORAL | Status: DC | PRN
  Administered 2022-10-28: 200 mg via ORAL
  Filled 2022-10-26: qty 2

## 2022-10-26 MED ORDER — FENTANYL CITRATE (PF) 100 MCG/2ML IJ SOLN
INTRAMUSCULAR | Status: DC | PRN
  Administered 2022-10-26: 25 ug via INTRAVENOUS

## 2022-10-26 MED ORDER — IODIXANOL 320 MG/ML IV SOLN
INTRAVENOUS | Status: DC | PRN
  Administered 2022-10-26: 55 mL

## 2022-10-26 MED ORDER — MIDAZOLAM HCL 2 MG/2ML IJ SOLN
INTRAMUSCULAR | Status: DC | PRN
  Administered 2022-10-26: 1 mg via INTRAVENOUS

## 2022-10-26 MED ORDER — CEFAZOLIN SODIUM-DEXTROSE 2-4 GM/100ML-% IV SOLN
INTRAVENOUS | Status: AC
  Filled 2022-10-26: qty 100

## 2022-10-26 MED ORDER — HALOPERIDOL LACTATE 5 MG/ML IJ SOLN
2.0000 mg | Freq: Once | INTRAMUSCULAR | Status: AC
  Administered 2022-10-26: 2 mg via INTRAVENOUS
  Filled 2022-10-26: qty 1

## 2022-10-26 MED ORDER — TRAZODONE HCL 50 MG PO TABS
25.0000 mg | ORAL_TABLET | Freq: Every evening | ORAL | Status: DC | PRN
  Administered 2022-10-28: 25 mg via ORAL
  Filled 2022-10-26: qty 1

## 2022-10-26 MED ORDER — METHYLPREDNISOLONE SODIUM SUCC 125 MG IJ SOLR: 125.0000 mg | Freq: Once | INTRAMUSCULAR | Status: DC | PRN

## 2022-10-26 MED ORDER — HALOPERIDOL LACTATE 5 MG/ML IJ SOLN
1.0000 mg | Freq: Two times a day (BID) | INTRAMUSCULAR | Status: DC | PRN
  Administered 2022-10-26: 1 mg via INTRAVENOUS
  Filled 2022-10-26: qty 1

## 2022-10-26 MED ORDER — MIDAZOLAM HCL 2 MG/2ML IJ SOLN
INTRAMUSCULAR | Status: AC
  Filled 2022-10-26: qty 2

## 2022-10-26 MED ORDER — FINASTERIDE 5 MG PO TABS
5.0000 mg | ORAL_TABLET | Freq: Every day | ORAL | Status: DC
  Administered 2022-10-26 – 2022-10-29 (×4): 5 mg via ORAL
  Filled 2022-10-26 (×4): qty 1

## 2022-10-26 MED ORDER — HEPARIN SODIUM (PORCINE) 1000 UNIT/ML IJ SOLN
INTRAMUSCULAR | Status: AC
  Filled 2022-10-26: qty 10

## 2022-10-26 MED ORDER — VITAMIN B-1 100 MG PO TABS
100.0000 mg | ORAL_TABLET | Freq: Every day | ORAL | Status: DC
  Administered 2022-10-27 – 2022-10-29 (×3): 100 mg via ORAL
  Filled 2022-10-26 (×7): qty 1

## 2022-10-26 SURGICAL SUPPLY — 15 items
CANISTER PENUMBRA ENGINE (MISCELLANEOUS) IMPLANT
CATH ANGIO 5F PIGTAIL 100CM (CATHETERS) IMPLANT
CATH INDIGO SEP 8 (CATHETERS) IMPLANT
CATH LIGHTNING 8 XTORQ 115 (CATHETERS) IMPLANT
CLOSURE PERCLOSE PROSTYLE (VASCULAR PRODUCTS) IMPLANT
COVER PROBE ULTRASOUND 5X96 (MISCELLANEOUS) IMPLANT
GLIDEWIRE ADV .035X180CM (WIRE) IMPLANT
PACK ANGIOGRAPHY (CUSTOM PROCEDURE TRAY) ×1 IMPLANT
SHEATH 9FRX11 (SHEATH) IMPLANT
SHEATH BRITE TIP 6FRX11 (SHEATH) IMPLANT
SUT MNCRL AB 4-0 PS2 18 (SUTURE) IMPLANT
SYR MEDRAD MARK 7 150ML (SYRINGE) IMPLANT
TUBING CONTRAST HIGH PRESS 72 (TUBING) IMPLANT
WIRE GUIDERIGHT .035X150 (WIRE) IMPLANT
WIRE SUPRACORE 300CM (WIRE) IMPLANT

## 2022-10-26 NOTE — Interval H&P Note (Signed)
History and Physical Interval Note:  10/26/2022 1:04 PM  Wesley Young  has presented today for surgery, with the diagnosis of Pulmonary Embolism.  The various methods of treatment have been discussed with the patient and family. After consideration of risks, benefits and other options for treatment, the patient has consented to  Procedure(s): PULMONARY THROMBECTOMY (Bilateral) as a surgical intervention.  The patient's history has been reviewed, patient examined, no change in status, stable for surgery.  I have reviewed the patient's chart and labs.  Questions were answered to the patient's satisfaction.     [name]

## 2022-10-26 NOTE — Assessment & Plan Note (Signed)
Losartan 50 mg daily resumed

## 2022-10-26 NOTE — Progress Notes (Addendum)
[facility]  - [place] at [facility]   PATIENT NAME: Wesley Young    MR#:  [id number]  DATE OF BIRTH:  1940-04-22  SUBJECTIVE:  patient seen earlier. Wife at bedside. Came in with increasing shortness of breath for last several days. Had some cough. Denies fever. 12C oncology and was sent to the ER for further evaluation. Found to have bilateral PEN DVT currently on heparin drip. NPO for vascular procedure. Patient denies any chest pain. Very weak and debilitated.    VITALS:  Blood pressure (!) 144/83, pulse (!) 101, temperature 97.6 F (36.4 C), temperature source Oral, resp. rate 13, height 6' (1.829 m), weight 78.3 kg, SpO2 95%.  PHYSICAL EXAMINATION:  appears weak GENERAL:  82 y.o.-year-old patient with no acute distress.  LUNGS: decreased breath sounds bilaterally, no wheezing CARDIOVASCULAR: S1, S2 normal. No murmur   ABDOMEN: Soft, nontender, nondistended. Bowel sounds present.  EXTREMITIES: No  edema b/l.    NEUROLOGIC: nonfocal  patient is alert and awake   LABORATORY PANEL:  CBC Recent Labs  Lab 10/25/22 1329  WBC 12.1*  HGB 8.9*  HCT 27.5*  PLT 261    Chemistries  Recent Labs  Lab 10/25/22 1329 10/26/22 0355  NA 133* 135  K 3.7 3.7  CL 99 102  CO2 21* 23  GLUCOSE 212* 119*  BUN 17 14  CREATININE 0.93 0.57*  CALCIUM 9.1 8.8*  AST 33  --   ALT 37  --   ALKPHOS 83  --   BILITOT 0.7  --    Cardiac Enzymes No results for input(s): "TROPONINI" in the last 168 hours. RADIOLOGY:  PERIPHERAL VASCULAR CATHETERIZATION  Result Date: 10/26/2022 See surgical note for result.  ECHOCARDIOGRAM COMPLETE  Result Date: 10/26/2022    ECHOCARDIOGRAM REPORT   Patient Name:   Wesley Young Date of Exam: 10/25/2022 Medical Rec #:  [id number]      Height:       72.0 in Accession #:    [id number]     Weight:       159.0 lb Date of Birth:  02-Aug-1940      BSA:          1.933 m Patient Age:    82 years       BP:           152/80 mmHg Patient Gender: M               HR:           85 bpm. Exam Location:  [facility] Procedure: 2D Echo, Cardiac Doppler and Color Doppler Indications:     I26.09 Pulmonary embolus  History:         Patient has prior history of Echocardiogram examinations, most                  recent 09/21/2022. CHF, Arrythmias:Atrial Fibrillation and                  Atrial Flutter, Signs/Symptoms:Dyspnea; Risk                  Factors:Hypertension, Dyslipidemia and Sleep Apnea.  Sonographer:     Daphine Deutscher RDCS Referring Phys:  [id number] AMY N COX Diagnosing Phys: [name] MD IMPRESSIONS  1. Left ventricular ejection fraction, by estimation, is 60 to 65%. The left ventricle has normal function. The left ventricle has no regional wall motion abnormalities. Left ventricular diastolic parameters are consistent with Grade I diastolic dysfunction (  impaired relaxation).  2. Right ventricular systolic function is normal. The right ventricular size is normal.  3. The mitral valve is normal in structure. Trivial mitral valve regurgitation.  4. The aortic valve is calcified. Aortic valve regurgitation is mild. Aortic valve sclerosis/calcification is present, without any evidence of aortic stenosis. FINDINGS  Left Ventricle: Left ventricular ejection fraction, by estimation, is 60 to 65%. The left ventricle has normal function. The left ventricle has no regional wall motion abnormalities. The left ventricular internal cavity size was normal in size. There is  no left ventricular hypertrophy. Left ventricular diastolic parameters are consistent with Grade I diastolic dysfunction (impaired relaxation). Right Ventricle: The right ventricular size is normal. No increase in right ventricular wall thickness. Right ventricular systolic function is normal. Left Atrium: Left atrial size was normal in size. Right Atrium: Right atrial size was normal in size. Pericardium: There is no evidence of pericardial effusion. Mitral Valve: The mitral valve is normal in  structure. Trivial mitral valve regurgitation. Tricuspid Valve: The tricuspid valve is normal in structure. Tricuspid valve regurgitation is mild. Aortic Valve: The aortic valve is calcified. Aortic valve regurgitation is mild. Aortic regurgitation PHT measures 447 msec. Aortic valve sclerosis/calcification is present, without any evidence of aortic stenosis. Pulmonic Valve: The pulmonic valve was normal in structure. Pulmonic valve regurgitation is not visualized. Aorta: The ascending aorta was not well visualized. IAS/Shunts: No atrial level shunt detected by color flow Doppler.  LEFT VENTRICLE PLAX 2D LVIDd:         5.10 cm Diastology LVIDs:         3.30 cm LV e' medial:    7.84 cm/s LV PW:         0.70 cm LV E/e' medial:  6.6 LV IVS:        0.70 cm LV e' lateral:   8.43 cm/s                        LV E/e' lateral: 6.2  RIGHT VENTRICLE             IVC RV Basal diam:  4.20 cm     IVC diam: 2.20 cm RV S prime:     10.18 cm/s TAPSE (M-mode): 2.3 cm LEFT ATRIUM             Index        RIGHT ATRIUM           Index LA diam:        3.70 cm 1.91 cm/m   RA Area:     18.50 cm LA Vol (A2C):   45.1 ml 23.34 ml/m  RA Volume:   59.70 ml  30.89 ml/m LA Vol (A4C):   65.3 ml 33.79 ml/m LA Biplane Vol: 58.1 ml 30.06 ml/m  AORTIC VALVE LVOT Vmax:   99.57 cm/s LVOT Vmean:  65.667 cm/s LVOT VTI:    0.176 m AI PHT:      447 msec  AORTA Ao Root diam: 4.00 cm Ao Asc diam:  4.10 cm MITRAL VALVE               TRICUSPID VALVE MV Area (PHT): 3.85 cm    TR Peak grad:   35.3 mmHg MV Decel Time: 197 msec    TR Vmax:        297.00 cm/s MV E velocity: 51.90 cm/s MV A velocity: 95.40 cm/s  SHUNTS MV E/A ratio:  0.54  Systemic VTI: 0.18 m [name] MD Electronically signed by [name] MD Signature Date/Time: 10/26/2022/1:28:02 PM    Final    CT Angio Chest PE W and/or Wo Contrast  Result Date: 10/25/2022 CLINICAL DATA:  active lung cancer, new shortness of breath with hypoxia, right leg swelling, concern for PE  EXAM: CT ANGIOGRAPHY CHEST WITH CONTRAST TECHNIQUE: Multidetector CT imaging of the chest was performed using the standard protocol during bolus administration of intravenous contrast. Multiplanar CT image reconstructions and MIPs were obtained to evaluate the vascular anatomy. RADIATION DOSE REDUCTION: This exam was performed according to the departmental dose-optimization program which includes automated exposure control, adjustment of the mA and/or kV according to patient size and/or use of iterative reconstruction technique. CONTRAST:  75mL OMNIPAQUE IOHEXOL 350 MG/ML SOLN COMPARISON:  08/04/2022 FINDINGS: Cardiovascular: SVC patent. Mild cardiomegaly with right-sided enlargement, mild RV dilatation. No pericardial effusion. Acute PE in right lower lobe pulmonary artery, extending into segmental branches, as well as smaller segmental and subsegmental emboli 2 right upper and middle lobes. Subsegmental emboli in the left upper and lower lobes. Scattered coronary calcifications. Adequate contrast opacification of the thoracic aorta with no evidence of dissection, aneurysm, or stenosis. There is classic 3-vessel brachiocephalic arch anatomy without proximal stenosis. Scattered atheromatous plaque in the arch and descending thoracic segment. Mediastinum/Nodes: No mediastinal hematoma, mass, or adenopathy. Lungs/Pleura: Small left pleural effusion, new since previous. Extensive interstitial and airspace opacities throughout most of the left lung, new since previous. Cavitation in the previously identified posterior left upper lobe mass which abuts the hilum and mediastinum. Scattered ground-glass opacities in the medial and posterior right lower lobe are new since previous. Upper Abdomen: No acute findings. Musculoskeletal: No acute findings.  Right shoulder DJD. Review of the MIP images confirms the above findings. IMPRESSION: 1. Acute right lower lobar and bilateral peripheral pulmonary emboli, right greater than  left. Critical Value/emergent results were called by telephone at the time of interpretation on 10/25/2022 at 4:16 pm to provider Dr Scotty Court, who verbally acknowledged these results. 2. Extensive interstitial and airspace opacities throughout most of the left lung, new since previous, with small left pleural effusion. 3. Cavitation in the previously identified posterior left upper lobe mass which abuts the hilum and mediastinum. 4.  Aortic Atherosclerosis (ICD10-I70.0). Electronically Signed   By: Corlis Leak M.D.   On: 10/25/2022 16:19   US Venous Img Lower Unilateral Right  Result Date: 10/25/2022 CLINICAL DATA:  Right lower extremity pain and edema. History of lung cancer. History of previous DVT. Evaluate for acute or chronic DVT. EXAM: RIGHT LOWER EXTREMITY VENOUS DOPPLER ULTRASOUND TECHNIQUE: Gray-scale sonography with graded compression, as well as color Doppler and duplex ultrasound were performed to evaluate the lower extremity deep venous systems from the level of the common femoral vein and including the common femoral, femoral, profunda femoral, popliteal and calf veins including the posterior tibial, peroneal and gastrocnemius veins when visible. The superficial great saphenous vein was also interrogated. Spectral Doppler was utilized to evaluate flow at rest and with distal augmentation maneuvers in the common femoral, femoral and popliteal veins. COMPARISON:  None Available. FINDINGS: Contralateral Common Femoral Vein: Respiratory phasicity is normal and symmetric with the symptomatic side. No evidence of thrombus. Normal compressibility. Common Femoral Vein: No evidence of thrombus. Normal compressibility, respiratory phasicity and response to augmentation. Saphenofemoral Junction: There is mixed echogenic occlusive thrombus involving the saphenofemoral junction (image 14). Profunda Femoral Vein: No evidence of thrombus. Normal compressibility and flow on  color Doppler imaging. Femoral Vein: No  evidence of thrombus. Normal compressibility, respiratory phasicity and response to augmentation. Popliteal Vein: No evidence of thrombus. Normal compressibility, respiratory phasicity and response to augmentation. Calf Veins: There is hypoechoic occlusive thrombus involving one of the paired divisions of the right posterior tibial vein (images 35 and 36). The paired peroneal veins appear patent where imaged. Superficial Great Saphenous Vein: There is mixed echogenic occlusive thrombus throughout the interrogated course of the greater saphenous vein (images 7 through 13). Patient's palpable lump within the proximal aspect of the right calf correlates with an occluded segment of the greater saphenous vein (image 12). Other Findings:  None. IMPRESSION: 1. The examination is positive for occlusive DVT involving one of the paired divisions of the right posterior tibial vein. There is no extension of this distal tibial DVT to the more proximal venous system of the right lower extremity. 2. Examination is positive for occlusive superficial thrombophlebitis involving the saphenofemoral junction (not extending to involve the common femoral vein) as well as the interrogated course of the greater saphenous vein. Electronically Signed   By: Simonne Come M.D.   On: 10/25/2022 15:50   DG Chest Portable 1 View  Result Date: 10/25/2022 CLINICAL DATA:  Sepsis. EXAM: PORTABLE CHEST 1 VIEW COMPARISON:  X-ray 08/05/2022 FINDINGS: Right lung is grossly clear. Hyperinflation. Right IJ chest port with the tip along the central SVC above the right atrium. Small left effusion with increasing mid to lower lung opacities. No pneumothorax. Calcified aorta. Overlapping cardiac leads. Apical pleural thickening. Degenerative changes of the spine. Osteopenia IMPRESSION: Small left effusion with developing mid to lower lung opacity. Acute infiltrate is possible. Recommend follow-up. Chest port Electronically Signed   By: Karen Kays M.D.   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   Bilateral pulmonary embolism (HCC) Right LE DVT (posterior Tibial vein) --Heparin gtt --Echo showed EF 60-65% , mild LVH (grade 1 DD) --Vascular consultation with Dr. Wyn Quaker. Patient is status post Mechanical thrombectomy using the penumbra CAT 8 device to the right lower lobe, middle lobe, and upper lobe pulmonary arteries in the left lower lobe pulmonary artery   Acute hypoxic respiratory failure secondary to write sided pneumonia and bilateral pulmonary embolism -- white count elevated, chest x-ray shows left sided infiltrate -- continue IV cefepime --  Metastatic squamous cell carcinoma lung (HCC) --Outpatient follow-up with hematology/oncology-- Dr. Cathie Hoops --Home afatinib not resumed on admission-- per oncology recommendation --pt has had brain radiation   Essential hypertension --Losartan    Chronic pain --Tramadol prn    OSA on CPAP --CPAP nightly ordered  H/o GERD, Esophageal varices --PPI   Generalized weakness with debility -- PT OT to see patient starting tomorrow      Procedures:Mechanical thrombectomy for PE Family communication : wife at bedside Consults : vascular CODE STATUS: full DVT Prophylaxis : heparin drip Level of care: Progressive Status is: Inpatient Remains inpatient appropriate because: bilateral PE, pneumonia, DVT    TOTAL TIME TAKING CARE OF THIS PATIENT: 45 minutes.  >50% time spent on counselling and coordination of care  Note: This dictation was prepared with Dragon dictation along with smaller phrase technology. Any transcriptional errors that result from this process are unintentional.  Enedina Finner  M.D    [facility]   CC: Primary care physician;  Lauro Regulus, MD

## 2022-10-26 NOTE — Assessment & Plan Note (Addendum)
Home afatinib held on admission per oncology recommendation as patient may have superimposed pneumonia

## 2022-10-26 NOTE — Consult Note (Signed)
Pharmacy Consult Note - Anticoagulation  Pharmacy Consult for heparin Indication: pulmonary embolus  PATIENT MEASUREMENTS: Height: 6' (182.9 cm) Weight: 78.3 kg (172 lb 9.9 oz) IBW/kg (Calculated) : 77.6 HEPARIN DW (KG): 72.1 9/4 Heparin DW (KG): 78.3 kg  VITAL SIGNS: Temp: 98.7 F (37.1 C) (09/04 0823) Temp Source: Oral (09/04 0823) BP: 146/85 (09/04 0823) Pulse Rate: 99 (09/04 0823)  Recent Labs    10/25/22 1329 10/26/22 0052 10/26/22 0355 10/26/22 1000  HGB 8.9*  --   --   --   HCT 27.5*  --   --   --   PLT 261  --   --   --   LABPROT 18.9*  --   --   --   INR 1.6*  --   --   --   HEPARINUNFRC  --    < >  --  0.42  CREATININE 0.93  --  0.57*  --    < > = values in this interval not displayed.    Estimated Creatinine Clearance: 75.4 mL/min (A) (by C-G formula based on SCr of 0.57 mg/dL (L)).  PAST MEDICAL HISTORY: Past Medical History:  Diagnosis Date   Abnormal PSA    s/p post prostate biopsy in the past which was negative   Actinic keratosis 03/14/2007   L ant lat neck at base of neck - bx proven    Actinic keratosis 01/08/2014   R calf - bx proven    Alcoholism (HCC)    with prolonged hospitalization 2009 for withdrawal c/b aspiration pneumonia requiring vent   Anxiety    Atrial fibrillation and flutter (HCC)    pt denies afib.   B12 deficiency    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 male with PMH dementia is presenting with pulmonary embolism. CT chest shows acute bilateral peripheral pulmonary embolic, right side greater than left. Also, extensive interstitial and airspace opacities in left lung, with small pleural effusion and possible cavitation in  left upper lobe.  Patient is not on chronic anticoagulation per chart review. Pharmacy has been consulted to initiate and manage heparin intravenous infusion.  Hemoglobin today is 8.9, and baseline appears to be 11-12 from June 2024. Will continue to monitor.  Pertinent medications: No chronic AC PTA per chart review  Goal(s) of therapy: Heparin level 0.3 - 0.7 units/mL Monitor platelets by anticoagulation protocol: Yes   Baseline anticoagulation labs: Recent Labs    10/25/22 1329  INR 1.6*  HGB 8.9*  PLT 261    Date Time aPTT/HL Rate/Comment 09/04 0052 HL 0.1  Subtherapeutic 09/04 1000 HL 0.42 Therapeutic x 1 @ 1350 un/hr  PLAN: Continue heparin infusion at 1350 units/hour. Recheck heparin level in 8 hours to confirm therapeutic rate, then daily once at least two levels are consecutively therapeutic. Monitor CBC daily while on heparin infusion.  Erek Kowal Rodriguez-Guzman PharmD, BCPS 10/26/2022 11:39 AM

## 2022-10-26 NOTE — Assessment & Plan Note (Signed)
PDMP reviewed Tramadol regimen been resumed per discussion with spouse and patient at bedside

## 2022-10-26 NOTE — Assessment & Plan Note (Addendum)
Outpatient follow-up with hematology/oncology as appropriate Home afatinib not resumed on admission

## 2022-10-26 NOTE — Op Note (Signed)
[facility]  Percutaneous Study/Intervention Procedural Note   Date of Surgery: 10/26/2022,2:14 PM  Surgeon: [name]  Pre-operative Diagnosis: Symptomatic bilateral pulmonary emboli, right greater than left  Post-operative diagnosis:  Same  Procedure(s) Performed:  1.  Contrast injection right heart  2.  Mechanical thrombectomy using the penumbra CAT 8 device to the right lower lobe, middle lobe, and upper lobe pulmonary arteries in the left lower lobe pulmonary artery  3.  Selective catheter placement right lower lobe, middle lobe, and upper lobe pulmonary arteries  4.  Selective catheter placement left lower lobe and upper lobe pulmonary arteries    Anesthesia: Conscious sedation was administered under my direct supervision by the interventional radiology RN. IV Versed plus fentanyl were utilized. Continuous ECG, pulse oximetry and blood pressure was monitored throughout the entire procedure.  Versed and fentanyl were administered intravenously.  Conscious sedation was administered for a total of 34 minutes using 1 mg of Versed and 25 mcg of Fentanyl.  EBL: 350 cc  Sheath: 9 Fr right femoral vein  Contrast: 55 cc   Fluoroscopy Time: 7.5 minutes  Indications:  Patient presents with pulmonary emboli. The patient is symptomatic with hypoxemia and dyspnea on exertion.  There is evidence of right heart strain on the CT angiogram. The patient is otherwise a good candidate for intervention and even the long-term benefits pulmonary angiography with thrombolysis is offered. The risks and benefits are reviewed long-term benefits are discussed. All questions are answered patient agrees to proceed.  Procedure:  Wesley Young a 82 y.o. male who was identified and appropriate procedural time out was performed.  The patient was then placed supine on the table and prepped and draped in the usual sterile fashion.  Ultrasound was used to evaluate the right common femoral  vein.  It was patent, as it was echolucent and compressible.  A digital ultrasound image was acquired for the permanent record.  A Seldinger needle was used to access the right common femoral vein under direct ultrasound guidance.  A 0.035 J wire was advanced without resistance and a 5Fr sheath was placed. A proglide is then placed and then upsized to an 9 [place] sheath.    The Advantage wire and pigtail catheter were then negotiated into the right atrium and bolus injection of contrast was utilized to demonstrate the right ventricle and the pulmonary artery outflow. The advantage wire and catheter were then negotiated into the left main pulmonary artery and then into the left lower lobe and upper lobe pulmonary arteries where hand injection of contrast was utilized to demonstrate the pulmonary arteries and confirm the locations of the pulmonary emboli.  Small amount of thrombus was seen in the left lower lobe pulmonary artery.  I then transition to the right side with the help of the advantage wire.  I cannulated first the right lower lobe, then the right middle and upper lobe pulmonary arteries and selective imaging demonstrated fairly extensive thrombosis particularly in the middle and lower lobes with a smaller amount of thrombus in the right upper lobe pulmonary artery.  3000 Units of heparin was then given and allowed to circulate.  The Penumbra Cat 8 catheter was then advanced up into the pulmonary vasculature. The right lung was addressed first. Catheter was negotiated into the right lower lobe pulmonary artery and mechanical thrombectomy was performed.  The separator was used.  Follow-up imaging demonstrated a good result and therefore the catheter was renegotiated into the right middle lobe pulmonary  artery and again mechanical thrombectomy was performed. Passes were made with both the Penumbra catheter itself as well as introducing the separator.  Finally, the catheter was negotiated into the right  upper lobe pulmonary artery and mechanical thrombectomy was performed with the help of the separator.  Follow-up imaging was then performed.  Marked improvement was seen on the right side although image quality was somewhat poor due to patient motion.  The Penumbra Cat 8 catheter was then negotiated to the opposite side. The left lung was then addressed. Catheter was negotiated into the left lower lobe pulmonary artery and mechanical thrombectomy was performed. Passes were made with both the Penumbra catheter itself as well as introducing the separator. Follow-up imaging was then performed.  Improvement was seen although image quality was somewhat poor due to patient motion.  After review these images wires were reintroduced and the catheters removed. Then, the sheath is then pulled, the proglide device is secured, and pressure is held. A 4-0 Monocryl suture was placed at the skin site and a dressing was placed.    Findings:   Right heart imaging:  Right atrium and right ventricle and the pulmonary outflow tract appears somewhat dilated.  Right lung:  fairly extensive thrombosis particularly in the middle and lower lobes with a smaller amount of thrombus in the right upper lobe pulmonary artery.  Left lung:  smaller amount of thrombus in the  left lower lobe pulmonary artery    Disposition: Patient was taken to the recovery room in stable condition having tolerated the procedure well.  [name] 10/26/2022,2:14 PM

## 2022-10-26 NOTE — Consult Note (Signed)
Pharmacy Consult Note - Anticoagulation  Pharmacy Consult for heparin Indication: pulmonary embolus  PATIENT MEASUREMENTS: Height: 6' (182.9 cm) Weight: 78.3 kg (172 lb 9.9 oz) IBW/kg (Calculated) : 77.6 HEPARIN DW (KG): 72.1 9/4 Heparin DW (KG): 78.3 kg  VITAL SIGNS: Temp: 97.7 F (36.5 C) (09/03 2337) Temp Source: Axillary (09/03 2337) BP: 156/90 (09/03 2337) Pulse Rate: 89 (09/03 2337)  Recent Labs    10/25/22 1329 10/26/22 0052  HGB 8.9*  --   HCT 27.5*  --   PLT 261  --   LABPROT 18.9*  --   INR 1.6*  --   HEPARINUNFRC  --  0.10*  CREATININE 0.93  --     Estimated Creatinine Clearance: 64.9 mL/min (by C-G formula based on SCr of 0.93 mg/dL).  PAST MEDICAL HISTORY: Past Medical History:  Diagnosis Date   Abnormal PSA    s/p post prostate biopsy in the past which was negative   Actinic keratosis 03/14/2007   L ant lat neck at base of neck - bx proven    Actinic keratosis 01/08/2014   R calf - bx proven    Alcoholism (HCC)    with prolonged hospitalization 2009 for withdrawal c/b aspiration pneumonia requiring vent   Anxiety    Atrial fibrillation and flutter (HCC)    pt denies afib.   B12 deficiency    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 Left ventricular ejection fraction app 45 %   CHF (congestive heart failure) (HCC)    mild left ventricular systolic dysfunction   Dental bridge present    "[name]" bridge, top - right   Duodenitis    Dysplastic nevus 01/08/2014   L prox ant thigh - mild    Dyspnea    on exertion   Dyspnea on exertion    Dysrhythmia    Erosive esophagitis    Esophageal varices (HCC)    Fibrosarcoma (HCC)    Gross hematuria    High cholesterol    Hx of  melanoma in situ 01/20/2015   L upper back paraspinal - excision    Hx of squamous cell carcinoma of skin 2014   multiple sites   Hypertension    Impotence    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HCC)    Sleep apnea    Spinal stenosis    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   L forearm - excision    Squamous cell carcinoma of skin 12/16/2015   R dorsum hand    Squamous cell carcinoma of skin 10/02/2017   R dorsum hand    Varicose veins of both lower extremities     ASSESSMENT: 82 y.o. male with PMH dementia is presenting with pulmonary embolism. CT chest shows acute bilateral peripheral pulmonary embolic, right side greater than left. Also, extensive interstitial and airspace opacities in left lung, with small pleural effusion and possible cavitation in left upper lobe.  Patient is not on chronic anticoagulation per chart review. Pharmacy has been consulted to initiate and manage heparin intravenous infusion.  Hemoglobin today is 8.9, and baseline appears to be 11-12 from June 2024. Will continue to monitor.  Pertinent medications: No chronic AC PTA per chart review  Goal(s) of therapy: Heparin level  0.3 - 0.7 units/mL Monitor platelets by anticoagulation protocol: Yes   Baseline anticoagulation labs: Recent Labs    10/25/22 1329  INR 1.6*  HGB 8.9*  PLT 261    Date Time aPTT/HL Rate/Comment 09/04 0052 HL 0.1  Subtherapeutic  PLAN: Give 2300 units bolus x1 Increase heparin infusion to 1350 units/hour. Recheck heparin level in 8 hours after rate change, then daily once at least two levels are consecutively therapeutic. Monitor CBC daily while on heparin infusion.  Otelia Sergeant, PharmD, MBA 10/26/2022 1:48 AM

## 2022-10-26 NOTE — Progress Notes (Signed)
Initial Nutrition Assessment  DOCUMENTATION CODES:   Not applicable  INTERVENTION:   -RD will follow for diet advancement and add supplements as appropriate  NUTRITION DIAGNOSIS:   Increased nutrient needs related to cancer and cancer related treatments as evidenced by estimated needs.  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   Reviewed I/O's: -667 ml x 24 hours  UOP: 810 ml x 24 hours  Attempted to speak with pt x 4, however, unavailable at times of visit (in with MD or down for procedure). No family present to provide additional history.   Pt currently NPO for scheduled thrombectomy today.   Pt followed by oncology and receiving chemo and radiation for stage IV lung cancer with mets to the brain.   Reviewed wt hx; pt has experienced a 3.3% wt loss over the past 9 months, which is not significant for time frame.   Pt at risk for malnutrition secondary to cancer and cancer related treatments and would benefit from addition of oral nutrition supplements.   Medications reviewed and include morphine and thiamine.   Labs reviewed.    Diet Order:   Diet Order             Diet NPO time specified Except for: Sips with Meds  Diet effective midnight                   EDUCATION NEEDS:   No education needs have been identified at this time  Skin:  Skin Assessment: Reviewed RN Assessment  Last BM:  10/25/22  Height:   Ht Readings from Last 1 Encounters:  10/26/22 6' (1.829 m)    Weight:   Wt Readings from Last 1 Encounters:  10/25/22 78.3 kg    Ideal Body Weight:  80.9 kg  BMI:   Body mass index is 23.41 kg/m.  Estimated Nutritional Needs:   Kcal:  2000-2200  Protein:  105-120 grams  Fluid:  > 2 L    Levada Schilling, RD, LDN, CDCES Registered Dietitian II Certified Diabetes Care and Education Specialist Please refer to [facility] for RD and/or RD on-call/weekend/after hours pager

## 2022-10-27 ENCOUNTER — Encounter: Payer: Self-pay | Admitting: Vascular Surgery

## 2022-10-27 DIAGNOSIS — E440 Moderate protein-calorie malnutrition: Secondary | ICD-10-CM | POA: Diagnosis not present

## 2022-10-27 DIAGNOSIS — C7931 Secondary malignant neoplasm of brain: Secondary | ICD-10-CM

## 2022-10-27 DIAGNOSIS — G8929 Other chronic pain: Secondary | ICD-10-CM

## 2022-10-27 DIAGNOSIS — C3492 Malignant neoplasm of unspecified part of left bronchus or lung: Secondary | ICD-10-CM | POA: Diagnosis not present

## 2022-10-27 DIAGNOSIS — I2699 Other pulmonary embolism without acute cor pulmonale: Secondary | ICD-10-CM | POA: Diagnosis not present

## 2022-10-27 LAB — CBC
HCT: 32.4 % — ABNORMAL LOW (ref 39.0–52.0)
Hemoglobin: 10.5 g/dL — ABNORMAL LOW (ref 13.0–17.0)
MCH: 28.6 pg (ref 26.0–34.0)
MCHC: 32.4 g/dL (ref 30.0–36.0)
MCV: 88.3 fL (ref 80.0–100.0)
Platelets: 373 10*3/uL (ref 150–400)
RBC: 3.67 MIL/uL — ABNORMAL LOW (ref 4.22–5.81)
RDW: 13.2 % (ref 11.5–15.5)
WBC: 20.1 10*3/uL — ABNORMAL HIGH (ref 4.0–10.5)
nRBC: 0 % (ref 0.0–0.2)

## 2022-10-27 LAB — HEPARIN LEVEL (UNFRACTIONATED)
Heparin Unfractionated: <0.1 [IU]/mL — ABNORMAL LOW (ref 0.30–0.70)
Heparin Unfractionated: <0.1 [IU]/mL — ABNORMAL LOW (ref 0.30–0.70)
Heparin Unfractionated: 0.22 [IU]/mL — ABNORMAL LOW (ref 0.30–0.70)
Heparin Unfractionated: >1.1 [IU]/mL — ABNORMAL HIGH (ref 0.30–0.70)

## 2022-10-27 MED ORDER — HEPARIN BOLUS VIA INFUSION
1100.0000 [IU] | Freq: Once | INTRAVENOUS | Status: AC
  Administered 2022-10-27: 1100 [IU] via INTRAVENOUS
  Filled 2022-10-27: qty 1100

## 2022-10-27 MED ORDER — HEPARIN BOLUS VIA INFUSION
2300.0000 [IU] | Freq: Once | INTRAVENOUS | Status: AC
  Administered 2022-10-27: 2300 [IU] via INTRAVENOUS
  Filled 2022-10-27: qty 2300

## 2022-10-27 NOTE — Consult Note (Signed)
Pharmacy Consult Note - Anticoagulation  Pharmacy Consult for heparin Indication: pulmonary embolus  PATIENT MEASUREMENTS: Height: 6' (182.9 cm) Weight: 78.3 kg (172 lb 9.9 oz) IBW/kg (Calculated) : 77.6 HEPARIN DW (KG): 72.1 9/4 Heparin DW (KG): 78.3 kg  VITAL SIGNS: Temp: 99.8 F (37.7 C) (09/04 2340) Temp Source: Oral (09/04 1618) BP: 156/84 (09/04 1618) Pulse Rate: 100 (09/04 1618)  Recent Labs    10/25/22 1329 10/26/22 0052 10/26/22 0355 10/26/22 1000 10/27/22 0106  HGB 8.9*  --   --   --   --   HCT 27.5*  --   --   --   --   PLT 261  --   --   --   --   LABPROT 18.9*  --   --   --   --   INR 1.6*  --   --   --   --   HEPARINUNFRC  --    < >  --    < > <0.10*  CREATININE 0.93  --  0.57*  --   --    < > = values in this interval not displayed.    Estimated Creatinine Clearance: 75.4 mL/min (A) (by C-G formula based on SCr of 0.57 mg/dL (L)).  PAST MEDICAL HISTORY: Past Medical History:  Diagnosis Date   Abnormal PSA    s/p post prostate biopsy in the past which was negative   Actinic keratosis 03/14/2007   L ant lat neck at base of neck - bx proven    Actinic keratosis 01/08/2014   R calf - bx proven    Alcoholism (HCC)    with prolonged hospitalization 2009 for withdrawal c/b aspiration pneumonia requiring vent   Anxiety    Atrial fibrillation and flutter (HCC)    pt denies afib.   B12 deficiency    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 Left ventricular ejection fraction app 45 %   CHF (congestive heart failure) (HCC)    mild left ventricular systolic dysfunction   Dental bridge present    "[name]" bridge, top - right   Duodenitis    Dysplastic nevus 01/08/2014   L prox ant  thigh - mild    Dyspnea    on exertion   Dyspnea on exertion    Dysrhythmia    Erosive esophagitis    Esophageal varices (HCC)    Fibrosarcoma (HCC)    Gross hematuria    High cholesterol    Hx of melanoma in situ 01/20/2015   L upper back paraspinal - excision    Hx of squamous cell carcinoma of skin 2014   multiple sites   Hypertension    Impotence    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HCC)    Sleep apnea    Spinal stenosis    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   L forearm - excision    Squamous cell carcinoma of skin 12/16/2015   R dorsum hand    Squamous cell carcinoma of skin 10/02/2017   R dorsum hand    Varicose veins of both lower extremities     ASSESSMENT: 82 y.o. male with PMH dementia is presenting with pulmonary embolism. CT chest shows acute bilateral peripheral  pulmonary embolic, right side greater than left. Also, extensive interstitial and airspace opacities in left lung, with small pleural effusion and possible cavitation in left upper lobe.  Patient is not on chronic anticoagulation per chart review. Pharmacy has been consulted to initiate and manage heparin intravenous infusion.  Hemoglobin today is 8.9, and baseline appears to be 11-12 from June 2024. Will continue to monitor.  Pertinent medications: No chronic AC PTA per chart review  Goal(s) of therapy: Heparin level 0.3 - 0.7 units/mL Monitor platelets by anticoagulation protocol: Yes   Baseline anticoagulation labs: Recent Labs    10/25/22 1329  INR 1.6*  HGB 8.9*  PLT 261    Date Time aPTT/HL Rate/Comment 09/04 0052 HL 0.1  Subtherapeutic 09/04 1000 HL 0.42 Therapeutic x 1 @ 1350 un/hr 09/04 2300 HL < 0.1 Subtherapeutic 09/05 0106 HL < 0.1 Subtherapeutic lvl confirmed  PLAN: Contacted RN, pt had pulled line earlier, but no known recent issues.  Ordered repeat HL to confirm. Bolus 2300 units x 1 Increase heparin infusion to 1600 units/hour. Recheck heparin level in 8 hours  after rate change, then daily once at least two levels are consecutively therapeutic. Monitor CBC daily while on heparin infusion.  Otelia Sergeant, PharmD, [facility] 10/27/2022 2:17 AM

## 2022-10-27 NOTE — Evaluation (Addendum)
Physical Therapy Evaluation Patient Details Name: Wesley Young MRN: [id number] DOB: Jun 22, 1940 Today's Date: 10/27/2022  History of Present Illness  Pt is a 82 y/o male presenting due to intial complaints of SOB and weakness. Futher medical workup revealed RLE DVT as well as acute right lower lobar and bilateral peripheral pulmonary emboli (R>L). As of 10/26/22, he is s/p pulmonary thrombectomy, R selective catheter placement (right lower lobe, middle lobe, and upper lobe pulmonary arteries). and L selective catheter placement (left lower lobe and upper lobe pulmonary arteries). 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  Clinical Impression   Pt presents laying in bed, no complaints of pain. He currently lives with his wife at home with 4 steps to enter and a rail on the left. PTA he was modi/ ind with ADLs and used a RW for ambulation.   PT screening revealed gross LE weakness B/L. Pt able to perform supine<>sit initially minA to for completion and to scoot EOB, but supervision with second attempt. Pt returned to supine due to poor O2 readings, which improved to >90% when reassessed in supine. Pt willing to continue with therapy and performed sit<>stand minA with elevated surface and cueing for hand placement/ leaning forward. He then step pivoted to recliner initially minAx2 with RW facilitation but transitioned to maxAx1 for completion (began sitting mid transfer). He would benefit from continued skilled therapy to maximize functional abilities and address deficits in activity tolerance.   Refer to general comments for O2 response to activity.       If plan is discharge home, recommend the following: A lot of help with walking and/or transfers;A lot of help with bathing/dressing/bathroom;Assistance with cooking/housework;Direct supervision/assist for medications management;Assist for transportation;Help with stairs or ramp for  entrance   Can travel by private vehicle   No    Equipment Recommendations Other (comment) (TBD)  Recommendations for Other Services       Functional Status Assessment Patient has had a recent decline in their functional status and demonstrates the ability to make significant improvements in function in a reasonable and predictable amount of time.     Precautions / Restrictions Precautions Precautions: Fall Restrictions Weight Bearing Restrictions: No      Mobility  Bed Mobility Overal bed mobility: Needs Assistance Bed Mobility: Supine to Sit, Sit to Supine     Supine to sit: Min assist, Supervision Sit to supine: Supervision   General bed mobility comments: increased time/effort, initial minA to for completion and to scoot EOB, overall supervision with second attempt    Transfers Overall transfer level: Needs assistance Equipment used: Rolling walker (2 wheels) Transfers: Sit to/from Stand, Bed to chair/wheelchair/BSC Sit to Stand: Mod assist, From elevated surface   Step pivot transfers: Min assist, Max assist, +2 physical assistance       General transfer comment: cueing for hand placement and leaning forward, minAx2 for side stepping but transitioned to maxAx1 to complete transfer to recliner (began sitting mid transfer)    Ambulation/Gait               General Gait Details: unable at this time due to fatigue  Stairs            Wheelchair Mobility     Tilt Bed    Modified Rankin (Stroke Patients Only)       Balance Overall balance assessment: Needs assistance Sitting-balance support: Bilateral upper extremity supported, Single extremity supported, Feet  supported Sitting balance-Leahy Scale: Fair Sitting balance - Comments: intermittent posterior lean, able to correct with cueing but became more prominent with fatigue Postural control: Posterior lean Standing balance support: Bilateral upper extremity supported, During functional  activity, Reliant on assistive device for balance Standing balance-Leahy Scale: Poor                               Pertinent Vitals/Pain Pain Assessment Pain Assessment: No/denies pain    Home Living Family/patient expects to be discharged to:: Private residence Living Arrangements: Spouse/significant other Available Help at Discharge: Family Type of Home: House Home Access: Stairs to enter Entrance Stairs-Rails: Left Entrance Stairs-Number of Steps: 4     Home Equipment: Agricultural consultant (2 wheels);Cane - single point      Prior Function               Mobility Comments: utilizes RW for ambulation ADLs Comments: modi/ind with ADLs     Extremity/Trunk Assessment   Upper Extremity Assessment Upper Extremity Assessment: Overall WFL for tasks assessed    Lower Extremity Assessment Lower Extremity Assessment: Generalized weakness       Communication   Communication Communication: No apparent difficulties Cueing Techniques: Verbal cues;Tactile cues  Cognition Arousal: Alert Behavior During Therapy: WFL for tasks assessed/performed Overall Cognitive Status: Impaired/Different from baseline Area of Impairment: Orientation                 Orientation Level: Disoriented to, Situation, Time                      General Comments General comments (skin integrity, edema, etc.): Pt on 8L O2 and SPO2 fluctuating with activity, lowest accurate reading 84%. PT noting increased mouth breathing and encouraged PLB and returned to/remained >90%    Exercises     Assessment/Plan    PT Assessment Patient needs continued PT services  PT Problem List Decreased strength;Decreased activity tolerance;Decreased balance;Decreased mobility;Decreased knowledge of use of DME;Decreased cognition       PT Treatment Interventions DME instruction;Gait training;Stair training;Functional mobility training;Therapeutic activities;Therapeutic exercise;Balance  training;Neuromuscular re-education;Patient/family education    PT Goals (Current goals can be found in the Care Plan section)  Acute Rehab PT Goals Patient Stated Goal: return home PT Goal Formulation: With patient Time For Goal Achievement: 11/10/22 Potential to Achieve Goals: Fair    Frequency Min 1X/week     Co-evaluation               AM-PAC PT "6 Clicks" Mobility  Outcome Measure Help needed turning from your back to your side while in a flat bed without using bedrails?: A Little Help needed moving from lying on your back to sitting on the side of a flat bed without using bedrails?: A Little Help needed moving to and from a bed to a chair (including a wheelchair)?: A Lot Help needed standing up from a chair using your arms (e.g., wheelchair or bedside chair)?: A Lot Help needed to walk in hospital room?: A Lot Help needed climbing 3-5 steps with a railing? : Total 6 Click Score: 13    End of Session Equipment Utilized During Treatment: Gait belt Activity Tolerance: Patient limited by fatigue Patient left: in chair;with call bell/phone within reach;with chair alarm set Nurse Communication: Mobility status PT Visit Diagnosis: Other abnormalities of gait and mobility (R26.89);Muscle weakness (generalized) (M62.81);Difficulty in walking, not elsewhere classified (R26.2)    Time: 1610-9604 PT  Time Calculation (min) (ACUTE ONLY): 44 min   Charges:                Blenda Nicely, PT, SPT 12:59 PM,10/27/22

## 2022-10-27 NOTE — Progress Notes (Signed)
Nutrition Follow-up  DOCUMENTATION CODES:   Non-severe (moderate) malnutrition in context of chronic illness  INTERVENTION:   -Increase Ensure Enlive po TID, each supplement provides 350 kcal and 20 grams of protein -MVI with minerals daily -Continue regular diet  NUTRITION DIAGNOSIS:   Moderate Malnutrition related to chronic illness (cancer and cancer related treatments) as evidenced by mild fat depletion, mild muscle depletion.  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 He reports feeling better, but complains of a slight sore throat. He shares that he has had a decreased appetite over the past 1-2 months due to multiple health issues including recent hospitalization for PE and side effects from chemo and radiation treatments that have affected his appetite. Per pt, he just finished radiation treatments. He typically consumes 2 meals per day, meat, starch, and vegetable prepared by his wife. He also drinks one Boost supplement daily.   Pt consumed only milk and Ensure off breakfast tray.   Reviewed wt hx; pt has experienced a 3.3% wt loss over the past 9 months, which is not significant for time frame. Pt estimates he has lost about 10# over the past month secondary to decreased appetite from cancer treatments.   Discussed importance of good meal and supplement intake to promote healing. Pt amenable to supplements.   Medications reviewed and  include thiamine.   No results found for: "HGBA1C" PTA DM medications are none.   Labs reviewed: CBGS: 112 (inpatient orders for glycemic control are none).    NUTRITION - FOCUSED PHYSICAL EXAM:  Flowsheet Row Most Recent Value  Orbital Region No depletion  Upper Arm Region Mild depletion  Thoracic and Lumbar Region Mild depletion  Buccal Region Mild depletion  Temple Region Mild depletion  Clavicle Bone Region Mild depletion  Clavicle and Acromion Bone Region Mild depletion  Scapular Bone Region Mild depletion  Dorsal Hand Mild depletion  Patellar Region Mild depletion  Anterior Thigh Region Mild depletion  Posterior Calf Region Mild depletion  Edema (RD Assessment) None  Hair Reviewed  Eyes Reviewed  Mouth Reviewed  Skin Reviewed  Nails Reviewed       Diet Order:   Diet Order             Diet regular Room service appropriate? Yes; Fluid consistency: Thin  Diet effective now                   EDUCATION NEEDS:   Education needs have been addressed  Skin:  Skin Assessment: Reviewed RN Assessment  Last BM:  10/26/22 (type 6)  Height:   Ht Readings from Last 1 Encounters:  10/26/22 6' (1.829 m)    Weight:   Wt Readings from Last 1 Encounters:  10/25/22 78.3 kg    Ideal Body Weight:  80.9 kg  BMI:  Body mass index is 23.41 kg/m.  Estimated Nutritional Needs:   Kcal:  2000-2200  Protein:  105-120 grams  Fluid:  > 2 L    Levada Schilling, RD, LDN, CDCES Registered Dietitian II Certified Diabetes Care and Education Specialist Please refer to [facility] for RD and/or RD on-call/weekend/after hours pager

## 2022-10-27 NOTE — Plan of Care (Signed)

## 2022-10-27 NOTE — TOC CM/SW Note (Signed)
Patient is only oriented to self. No family at bedside. CSW left voicemail for wife. Will complete readmission prevention screen when she calls back.  Charlynn Court, CSW [phone]

## 2022-10-27 NOTE — Progress Notes (Signed)
  Progress Note    10/27/2022 3:00 PM 1 Day Post-Op  Subjective:  Wesley Young is an 82 yo male now POD#1 from pulmonary Thrombectomy. Patient is recovering as expected. Patient remains on 5 liters nasal cannula oxygen due to left upper lobe malignancy and a super imposed pneumonia. Patients wife endorses patient was agitated and awake all night. Patient sleeping this afternoon. Wife endorses patient was feeling better immediately afterward. No complaints overnight and vital all remain stable.    Vitals:   10/27/22 0743 10/27/22 1139  BP: 138/71 102/73  Pulse: 98 98  Resp: (!) 23 (!) 22  Temp: 98.1 F (36.7 C) 97.6 F (36.4 C)  SpO2: 93% 99%   Physical Exam: Cardiac:  RRR, Normal S1, S2. No Murmurs Lungs:  On auscultation patient with scatter rhonchi throughout. Diminished in left upper lobe.  Incisions:  Right groin with dressing clean dry and intact. No hematoma or seroma Extremities:  Bilateral lower extremities warm to touch and with palpable pulses. Abdomen:  Positive bowel sounds throughout, soft , non tender and non distended Neurologic: AAOX 1 name only. Very tired but arousable.   CBC    Component Value Date/Time   WBC 20.1 (H) 10/27/2022 0622   RBC 3.67 (L) 10/27/2022 0622   HGB 10.5 (L) 10/27/2022 0622   HGB 9.9 (L) 10/06/2022 1026   HCT 32.4 (L) 10/27/2022 0622   PLT 373 10/27/2022 0622   PLT 283 10/06/2022 1026   MCV 88.3 10/27/2022 0622   MCH 28.6 10/27/2022 0622   MCHC 32.4 10/27/2022 0622   RDW 13.2 10/27/2022 0622   LYMPHSABS 0.1 (L) 10/25/2022 1329   MONOABS 1.0 10/25/2022 1329   EOSABS 0.3 10/25/2022 1329   BASOSABS 0.0 10/25/2022 1329    BMET    Component Value Date/Time   NA 135 10/26/2022 0355   K 3.7 10/26/2022 0355   CL 102 10/26/2022 0355   CO2 23 10/26/2022 0355   GLUCOSE 119 (H) 10/26/2022 0355   BUN 14 10/26/2022 0355   CREATININE 0.57 (L) 10/26/2022 0355   CREATININE 0.69 10/06/2022 1026   CALCIUM 8.8 (L) 10/26/2022 0355    GFRNONAA >60 10/26/2022 0355   GFRNONAA >60 10/06/2022 1026   GFRAA >60 08/21/2019 1132    INR    Component Value Date/Time   INR 1.6 (H) 10/25/2022 1329     Intake/Output Summary (Last 24 hours) at 10/27/2022 1500 Last data filed at 10/27/2022 1236 Gross per 24 hour  Intake 987.5 ml  Output --  Net 987.5 ml     Assessment/Plan:  82 y.o. male is s/p Pulmonary Thrombectomy.  1 Day Post-Op   PLAN: Convert Heparin Infusion to oral anticoagulation  PT/OT  Pain medications PRN  DVT prophylaxis:  Heparin Infusion   Marcie [facility] 10/27/2022 3:00 PM

## 2022-10-27 NOTE — Progress Notes (Signed)
[facility]  - [place] [facility]   PATIENT NAME: Wesley Young    MR#:  [id number]  DATE OF BIRTH:  Feb 16, 1941  SUBJECTIVE:  patient seen earlier. Wife at bedside. Patient sitting in the recliner have was eating breakfast earlier. We visited wife who was a bit frustrated with overall patient's care. Discussed her concerns and discussed with nurse to help patient out. Patient apparently was agitated quite confused yesterday after the procedure received Haldol would seem to help. Received dose of geode on as ordered by nighttime MD. Wife wants to discontinue Haldol and Geodon.   VITALS:  Blood pressure 102/73, pulse 98, temperature 97.6 F (36.4 C), temperature source Oral, resp. rate (!) 22, height 6' (1.829 m), weight 78.3 kg, SpO2 99%.  PHYSICAL EXAMINATION:  appears weak GENERAL:  82 y.o.-year-old patient with no acute distress.  LUNGS: decreased breath sounds bilaterally CARDIOVASCULAR: S1, S2 normal. No murmur, mild tachycardia ABDOMEN: Soft, nontender, nondistended. Bowel sounds present.  EXTREMITIES: No  edema b/l.    NEUROLOGIC: nonfocal  patient is alert and awake, deconditioned  LABORATORY PANEL:  CBC Recent Labs  Lab 10/27/22 0622  WBC 20.1*  HGB 10.5*  HCT 32.4*  PLT 373    Chemistries  Recent Labs  Lab 10/25/22 1329 10/26/22 0355  NA 133* 135  K 3.7 3.7  CL 99 102  CO2 21* 23  GLUCOSE 212* 119*  BUN 17 14  CREATININE 0.93 0.57*  CALCIUM 9.1 8.8*  AST 33  --   ALT 37  --   ALKPHOS 83  --   BILITOT 0.7  --    Cardiac Enzymes No results for input(s): "TROPONINI" in the last 168 hours. RADIOLOGY:  PERIPHERAL VASCULAR CATHETERIZATION  Result Date: 10/26/2022 See surgical note for result.  ECHOCARDIOGRAM COMPLETE  Result Date: 10/26/2022    ECHOCARDIOGRAM REPORT   Patient Name:   Wesley Young Date of Exam: 10/25/2022 Medical Rec #:  [id number]      Height:       72.0 in Accession #:    [id number]     Weight:       159.0 lb Date of  Birth:  Sep 13, 1940      BSA:          1.933 m Patient Age:    82 years       BP:           152/80 mmHg Patient Gender: M              HR:           85 bpm. Exam Location:  [facility] Procedure: 2D Echo, Cardiac Doppler and Color Doppler Indications:     I26.09 Pulmonary embolus  History:         Patient has prior history of Echocardiogram examinations, most                  recent 09/21/2022. CHF, Arrythmias:Atrial Fibrillation and                  Atrial Flutter, Signs/Symptoms:Dyspnea; Risk                  Factors:Hypertension, Dyslipidemia and Sleep Apnea.  Sonographer:     Daphine Deutscher RDCS Referring Phys:  [id number] AMY N COX Diagnosing Phys: [name] MD IMPRESSIONS  1. Left ventricular ejection fraction, by estimation, is 60 to 65%. The left ventricle has normal function. The left ventricle has no regional wall motion  abnormalities. Left ventricular diastolic parameters are consistent with Grade I diastolic dysfunction (impaired relaxation).  2. Right ventricular systolic function is normal. The right ventricular size is normal.  3. The mitral valve is normal in structure. Trivial mitral valve regurgitation.  4. The aortic valve is calcified. Aortic valve regurgitation is mild. Aortic valve sclerosis/calcification is present, without any evidence of aortic stenosis. FINDINGS  Left Ventricle: Left ventricular ejection fraction, by estimation, is 60 to 65%. The left ventricle has normal function. The left ventricle has no regional wall motion abnormalities. The left ventricular internal cavity size was normal in size. There is  no left ventricular hypertrophy. Left ventricular diastolic parameters are consistent with Grade I diastolic dysfunction (impaired relaxation). Right Ventricle: The right ventricular size is normal. No increase in right ventricular wall thickness. Right ventricular systolic function is normal. Left Atrium: Left atrial size was normal in size. Right Atrium: Right atrial size was  normal in size. Pericardium: There is no evidence of pericardial effusion. Mitral Valve: The mitral valve is normal in structure. Trivial mitral valve regurgitation. Tricuspid Valve: The tricuspid valve is normal in structure. Tricuspid valve regurgitation is mild. Aortic Valve: The aortic valve is calcified. Aortic valve regurgitation is mild. Aortic regurgitation PHT measures 447 msec. Aortic valve sclerosis/calcification is present, without any evidence of aortic stenosis. Pulmonic Valve: The pulmonic valve was normal in structure. Pulmonic valve regurgitation is not visualized. Aorta: The ascending aorta was not well visualized. IAS/Shunts: No atrial level shunt detected by color flow Doppler.  LEFT VENTRICLE PLAX 2D LVIDd:         5.10 cm Diastology LVIDs:         3.30 cm LV e' medial:    7.84 cm/s LV PW:         0.70 cm LV E/e' medial:  6.6 LV IVS:        0.70 cm LV e' lateral:   8.43 cm/s                        LV E/e' lateral: 6.2  RIGHT VENTRICLE             IVC RV Basal diam:  4.20 cm     IVC diam: 2.20 cm RV S prime:     10.18 cm/s TAPSE (M-mode): 2.3 cm LEFT ATRIUM             Index        RIGHT ATRIUM           Index LA diam:        3.70 cm 1.91 cm/m   RA Area:     18.50 cm LA Vol (A2C):   45.1 ml 23.34 ml/m  RA Volume:   59.70 ml  30.89 ml/m LA Vol (A4C):   65.3 ml 33.79 ml/m LA Biplane Vol: 58.1 ml 30.06 ml/m  AORTIC VALVE LVOT Vmax:   99.57 cm/s LVOT Vmean:  65.667 cm/s LVOT VTI:    0.176 m AI PHT:      447 msec  AORTA Ao Root diam: 4.00 cm Ao Asc diam:  4.10 cm MITRAL VALVE               TRICUSPID VALVE MV Area (PHT): 3.85 cm    TR Peak grad:   35.3 mmHg MV Decel Time: 197 msec    TR Vmax:        297.00 cm/s MV E velocity: 51.90 cm/s MV A velocity: 95.40 cm/s  SHUNTS MV E/A ratio:  0.54        Systemic VTI: 0.18 m [name] MD Electronically signed by [name] MD Signature Date/Time: 10/26/2022/1:28:02 PM    Final    US Venous Img Lower Unilateral Right  Result Date:  10/25/2022 CLINICAL DATA:  Right lower extremity pain and edema. History of lung cancer. History of previous DVT. Evaluate for acute or chronic DVT. EXAM: RIGHT LOWER EXTREMITY VENOUS DOPPLER ULTRASOUND TECHNIQUE: Gray-scale sonography with graded compression, as well as color Doppler and duplex ultrasound were performed to evaluate the lower extremity deep venous systems from the level of the common femoral vein and including the common femoral, femoral, profunda femoral, popliteal and calf veins including the posterior tibial, peroneal and gastrocnemius veins when visible. The superficial great saphenous vein was also interrogated. Spectral Doppler was utilized to evaluate flow at rest and with distal augmentation maneuvers in the common femoral, femoral and popliteal veins. COMPARISON:  None Available. FINDINGS: Contralateral Common Femoral Vein: Respiratory phasicity is normal and symmetric with the symptomatic side. No evidence of thrombus. Normal compressibility. Common Femoral Vein: No evidence of thrombus. Normal compressibility, respiratory phasicity and response to augmentation. Saphenofemoral Junction: There is mixed echogenic occlusive thrombus involving the saphenofemoral junction (image 14). Profunda Femoral Vein: No evidence of thrombus. Normal compressibility and flow on color Doppler imaging. Femoral Vein: No evidence of thrombus. Normal compressibility, respiratory phasicity and response to augmentation. Popliteal Vein: No evidence of thrombus. Normal compressibility, respiratory phasicity and response to augmentation. Calf Veins: There is hypoechoic occlusive thrombus involving one of the paired divisions of the right posterior tibial vein (images 35 and 36). The paired peroneal veins appear patent where imaged. Superficial Great Saphenous Vein: There is mixed echogenic occlusive thrombus throughout the interrogated course of the greater saphenous vein (images 7 through 13). Patient's palpable lump  within the proximal aspect of the right calf correlates with an occluded segment of the greater saphenous vein (image 12). Other Findings:  None. IMPRESSION: 1. The examination is positive for occlusive DVT involving one of the paired divisions of the right posterior tibial vein. There is no extension of this distal tibial DVT to the more proximal venous system of the right lower extremity. 2. Examination is positive for occlusive superficial thrombophlebitis involving the saphenofemoral junction (not extending to involve the common femoral vein) as well as the interrogated course of the greater saphenous vein. Electronically Signed   By: Simonne Come M.D.   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   Bilateral pulmonary embolism (HCC) Right LE DVT (posterior Tibial vein) --Heparin gtt --Echo showed EF 60-65% , mild LVH (grade 1 DD) --Vascular consultation with Dr. Wyn Quaker. Patient is status post Mechanical thrombectomy using the penumbra CAT 8 device to the right lower lobe, middle lobe, and upper lobe pulmonary arteries in the left lower lobe pulmonary artery  -- try to wean oxygen down as able to. Will continue heparin drip for today and transition to oral eliquis tomorrow  Acute hypoxic respiratory failure secondary to Right sided pneumonia and bilateral pulmonary embolism -- white count elevated, chest x-ray shows left sided infiltrate -- continue IV cefepime --  Metastatic squamous cell carcinoma lung (HCC) --Outpatient follow-up with hematology/oncology-- Dr. Cathie Hoops --  Home afatinib not resumed on admission-- per oncology recommendation --pt has had brain radiation   Essential hypertension --Losartan    Chronic pain --Tramadol prn    OSA on  CPAP --CPAP nightly ordered  H/o GERD, Esophageal varices --PPI   Generalized weakness with debility -- PT OT to see patient starting tomorrow       Procedures:Mechanical thrombectomy for PE Family communication : wife at bedside Consults : vascular CODE STATUS: full DVT Prophylaxis : heparin drip Level of care: Progressive Status is: Inpatient Remains inpatient appropriate because: bilateral PE, pneumonia, DVT    TOTAL TIME TAKING CARE OF THIS PATIENT: 35 minutes.  >50% time spent on counselling and coordination of care  Note: This dictation was prepared with Dragon dictation along with smaller phrase technology. Any transcriptional errors that result from this process are unintentional.  Enedina Finner M.D    [facility]   CC: Primary care physician; Lauro Regulus, MD

## 2022-10-27 NOTE — Consult Note (Signed)
Pharmacy Consult Note - Anticoagulation  Pharmacy Consult for heparin Indication: pulmonary embolus  PATIENT MEASUREMENTS: Height: 6' (182.9 cm) Weight: 78.3 kg (172 lb 9.9 oz) IBW/kg (Calculated) : 77.6 HEPARIN DW (KG): 72.1 9/4 Heparin DW (KG): 78.3 kg  VITAL SIGNS: Temp: 97.6 F (36.4 C) (09/05 1139) Temp Source: Oral (09/05 1139) BP: 102/73 (09/05 1139) Pulse Rate: 98 (09/05 1139)  Recent Labs    10/25/22 1329 10/26/22 0052 10/26/22 0355 10/26/22 1000 10/27/22 0622 10/27/22 1121  HGB 8.9*  --   --   --  10.5*  --   HCT 27.5*  --   --   --  32.4*  --   PLT 261  --   --   --  373  --   LABPROT 18.9*  --   --   --   --   --   INR 1.6*  --   --   --   --   --   HEPARINUNFRC  --    < >  --    < >  --  0.22*  CREATININE 0.93  --  0.57*  --   --   --    < > = values in this interval not displayed.    Estimated Creatinine Clearance: 75.4 mL/min (A) (by C-G formula based on SCr of 0.57 mg/dL (L)).  PAST MEDICAL HISTORY: Past Medical History:  Diagnosis Date   Abnormal PSA    s/p post prostate biopsy in the past which was negative   Actinic keratosis 03/14/2007   L ant lat neck at base of neck - bx proven    Actinic keratosis 01/08/2014   R calf - bx proven    Alcoholism (HCC)    with prolonged hospitalization 2009 for withdrawal c/b aspiration pneumonia requiring vent   Anxiety    Atrial fibrillation and flutter (HCC)    pt denies afib.   B12 deficiency    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 male with PMH dementia is presenting with pulmonary embolism. CT chest shows acute bilateral peripheral pulmonary embolic, right side greater than left. Also, extensive interstitial and airspace opacities in left lung, with small pleural effusion and possible cavitation in left upper lobe.  Patient is not on chronic anticoagulation per chart review. Pharmacy has been consulted to initiate and manage heparin intravenous infusion.  Hemoglobin today is 8.9, and baseline appears to be 11-12 from June 2024. Will continue to monitor.  Pertinent medications: No chronic AC PTA per chart review  Goal(s) of therapy: Heparin level 0.3 - 0.7 units/mL Monitor platelets by anticoagulation protocol: Yes   Baseline anticoagulation labs: Recent Labs    10/25/22 1329 10/27/22 0622  INR 1.6*  --   HGB 8.9* 10.5*  PLT 261 373    Date Time aPTT/HL Rate/Comment 09/04 0052 HL 0.1  Subtherapeutic 09/04 1000 HL 0.42 Therapeutic x 1 @ 1350 un/hr 09/04 2300 HL < 0.1 Subtherapeutic 09/05 0106 HL < 0.1 Subtherapeutic lvl confirmed 09/05 1121 HL 0.22 Subtherapeutic, rate 1600 un/hr  PLAN: Bolus with Heparin 1100 units IV x 1, then  Increase heparin infusion to 1750  units/hour. Recheck heparin level in 8 hours after rate change, then daily once at least two levels are consecutively therapeutic. Monitor CBC daily while on heparin infusion.  Lang Zingg Rodriguez-Guzman PharmD, BCPS 10/27/2022 12:09 PM

## 2022-10-27 NOTE — NC FL2 (Signed)
[place] MEDICAID [id number] LEVEL OF CARE FORM     IDENTIFICATION  Patient Name: Wesley Young Birthdate: 04-24-1940 Sex: male Admission Date (Current Location): 10/25/2022  [place] and [place] Number:  Chiropodist and Address:  [facility], [address] [place], [place] [address]      Provider Number: [id number]  Attending Physician Name and Address:  Enedina Finner, MD  Relative Name and Phone Number:       Current Level of Care: Hospital Recommended Level of Care: Skilled Nursing Facility Prior Approval Number:    Date Approved/Denied:   PASRR Number: [id number]  Discharge Plan: SNF    Current Diagnoses: Patient Active Problem List   Diagnosis Date Noted   Malnutrition of moderate degree 10/27/2022   Bilateral pulmonary embolism (HCC) 10/25/2022   Acneiform rash 09/20/2022   Encounter for antineoplastic chemotherapy 09/20/2022   Port-A-Cath in place 09/20/2022   Metastasis to brain (HCC) 09/03/2022   Goals of care, counseling/discussion 08/11/2022   Squamous cell carcinoma lung (HCC) 08/02/2022   Essential hypertension 08/02/2022   Depression with anxiety 08/02/2022   OSA on CPAP 08/02/2022   Chronic pain 08/02/2022   Normocytic anemia 08/02/2022   Lung neoplasm 08/02/2022    Orientation RESPIRATION BLADDER Height & Weight     Self  O2 (Nasal Cannula 6 L. Has home CPAP.) Continent Weight: 172 lb 9.9 oz (78.3 kg) Height:  6' (182.9 cm)  BEHAVIORAL SYMPTOMS/MOOD NEUROLOGICAL BOWEL NUTRITION STATUS   (None)  (None) Incontinent Diet (Regular)  AMBULATORY STATUS COMMUNICATION OF NEEDS Skin   Extensive Assist Verbally Skin abrasions, Bruising, Other (Comment) (Blister)                       Personal Care Assistance Level of Assistance  Bathing, Feeding, Dressing Bathing Assistance: Maximum assistance Feeding assistance: Limited assistance Dressing Assistance: Maximum assistance     Functional Limitations Info  Sight,  Hearing, Speech Sight Info: Adequate Hearing Info: Adequate Speech Info: Adequate    SPECIAL CARE FACTORS FREQUENCY  PT (By licensed PT), OT (By licensed OT)     PT Frequency: 5 x week OT Frequency: 5 x week            Contractures Contractures Info: Not present    Additional Factors Info  Code Status, Allergies, Isolation Precautions Code Status Info: Full code Allergies Info: Ativan (Lorazepam), Benadryl (Diphenhydramine), Crestor (Rosuvastatin Calcium), Nsaids, Rapaflo (Silodosin), Statins, Zetia (Ezetimibe), Latex, Neosporin (Neomycin-bacitracin Zn-polymyx)     Isolation Precautions Info: Protective precautions     Current Medications (10/27/2022):  This is the current hospital active medication list Current Facility-Administered Medications  Medication Dose Route Frequency Provider Last Rate Last Admin   acetaminophen (TYLENOL) tablet 650 mg  650 mg Oral Q6H PRN Annice Needy, MD       Or   acetaminophen (TYLENOL) suppository 650 mg  650 mg Rectal Q6H PRN Wyn Quaker, Marlow Baars, MD       benzonatate (TESSALON) capsule 200 mg  200 mg Oral TID PRN Annice Needy, MD       ceFEPIme (MAXIPIME) 2 g in sodium chloride 0.9 % 100 mL IVPB  2 g Intravenous Q8H Annice Needy, MD 200 mL/hr at 10/27/22 1018 2 g at 10/27/22 1018   donepezil (ARICEPT) tablet 5 mg  5 mg Oral Daily Annice Needy, MD   5 mg at 10/27/22 1016   feeding supplement (ENSURE ENLIVE / ENSURE PLUS) liquid 237 mL  237 mL Oral BID BM Mansy, Jan A, MD   237 mL at 10/27/22 1024   finasteride (PROSCAR) tablet 5 mg  5 mg Oral Daily Annice Needy, MD   5 mg at 10/27/22 1016   heparin ADULT infusion 100 units/mL (25000 units/256mL)  1,750 Units/hr Intravenous Continuous Enedina Finner, MD 17.5 mL/hr at 10/27/22 1325 1,750 Units/hr at 10/27/22 1325   losartan (COZAAR) tablet 50 mg  50 mg Oral Daily Annice Needy, MD   50 mg at 10/27/22 1015   ondansetron (ZOFRAN) tablet 4 mg  4 mg Oral Q6H PRN Annice Needy, MD       Or   ondansetron (ZOFRAN)  injection 4 mg  4 mg Intravenous Q6H PRN Annice Needy, MD       pantoprazole (PROTONIX) EC tablet 40 mg  40 mg Oral Daily Enedina Finner, MD   40 mg at 10/27/22 1016   pramipexole (MIRAPEX) tablet 0.5 mg  0.5 mg Oral QHS Annice Needy, MD   0.5 mg at 10/26/22 2051   senna-docusate (Senokot-S) tablet 1 tablet  1 tablet Oral QHS PRN Annice Needy, MD       tamsulosin (FLOMAX) capsule 0.4 mg  0.4 mg Oral Daily Annice Needy, MD   0.4 mg at 10/27/22 1016   thiamine (Vitamin B-1) tablet 100 mg  100 mg Oral Daily Annice Needy, MD   100 mg at 10/27/22 1015   traMADol (ULTRAM) tablet 50 mg  50 mg Oral QID Annice Needy, MD   50 mg at 10/27/22 1200   traZODone (DESYREL) tablet 25 mg  25 mg Oral QHS PRN Mansy, Jan A, MD       Facility-Administered Medications Ordered in Other Encounters  Medication Dose Route Frequency Provider Last Rate Last Admin   sodium chloride flush (NS) 0.9 % injection 10 mL  10 mL Intravenous Once Rickard Patience, MD         Discharge Medications: Please see discharge summary for a list of discharge medications.  Relevant Imaging Results:  Relevant Lab Results:   Additional Information SS#: [id number]  Margarito Liner, LCSW

## 2022-10-27 NOTE — TOC Initial Note (Signed)
Transition of Care [facility]) - Initial/Assessment Note    Patient Details  Name: Wesley Young MRN: [id number] Date of Birth: Dec 14, 1940  Transition of [facility]) CM/SW Contact:    Margarito Liner, LCSW Phone Number: 10/27/2022, 2:02 PM  Clinical Narrative:  Patient only oriented to self. Wife at bedside. CSW introduced role and explained that PT recommendations would be discussed. Wife is willing to consider SNF placement. CSW provided CMS scores for facilities within 25 miles of their zip code. No further concerns. CSW encouraged patient's wife to contact CSW as needed. CSW will continue to follow patient and his wife for support and facilitate discharge to SNF once medically stable.                Expected Discharge Plan: Skilled Nursing Facility Barriers to Discharge: Continued Medical Work up   Patient Goals and CMS Choice            Expected Discharge Plan and Services     Post Acute Care Choice: Skilled Nursing Facility Living arrangements for the past 2 months: Single Family Home                                      Prior Living Arrangements/Services Living arrangements for the past 2 months: Single Family Home Lives with:: Spouse Patient language and need for interpreter reviewed:: Yes Do you feel safe going back to the place where you live?: Yes      Need for Family Participation in Patient Care: Yes (Comment) Care giver support system in place?: Yes (comment)   Criminal Activity/Legal Involvement Pertinent to Current Situation/Hospitalization: No - Comment as needed  Activities of Daily Living Home Assistive Devices/Equipment: Walker (specify type) (two wheel in front) ADL Screening (condition at time of admission) Patient's cognitive ability adequate to safely complete daily activities?: Yes Is the patient deaf or have difficulty hearing?: Yes Does the patient have difficulty seeing, even when wearing glasses/contacts?: No Does the patient have  difficulty concentrating, remembering, or making decisions?: No Patient able to express need for assistance with ADLs?: Yes Does the patient have difficulty dressing or bathing?: Yes Independently performs ADLs?: No Communication: Independent Dressing (OT): Needs assistance Is this a change from baseline?: Pre-admission baseline Grooming: Needs assistance Is this a change from baseline?: Pre-admission baseline Feeding: Needs assistance Is this a change from baseline?: Pre-admission baseline Bathing: Needs assistance Is this a change from baseline?: Pre-admission baseline Toileting: Needs assistance Is this a change from baseline?: Pre-admission baseline In/Out Bed: Needs assistance Is this a change from baseline?: Pre-admission baseline Walks in Home: Needs assistance Is this a change from baseline?: Pre-admission baseline Does the patient have difficulty walking or climbing stairs?: Yes Weakness of Legs: None Weakness of Arms/Hands: None  Permission Sought/Granted Permission sought to share information with : Facility Medical sales representative, Family Supports    Share Information with NAME: Jabali Everage  Permission granted to share info w AGENCY: SNF's  Permission granted to share info w Relationship: Wife  Permission granted to share info w Contact Information: [phone]  Emotional Assessment Appearance:: Appears stated age Attitude/Demeanor/Rapport: Unable to Assess Affect (typically observed): Unable to Assess Orientation: : Oriented to Self Alcohol / Substance Use: Not Applicable Psych Involvement: No (comment)  Admission diagnosis:  Bilateral pulmonary embolism (HCC) [I26.99] Acute respiratory failure with hypoxia (HCC) [J96.01] Other acute pulmonary embolism without acute cor pulmonale (HCC) [I26.99] Patient Active Problem List  Diagnosis Date Noted   Malnutrition of moderate degree 10/27/2022   Bilateral pulmonary embolism (HCC) 10/25/2022   Acneiform rash  09/20/2022   Encounter for antineoplastic chemotherapy 09/20/2022   Port-A-Cath in place 09/20/2022   Metastasis to brain [facility]) 09/03/2022   Goals of care, counseling/discussion 08/11/2022   Squamous cell carcinoma lung (HCC) 08/02/2022   Essential hypertension 08/02/2022   Depression with anxiety 08/02/2022   OSA on CPAP 08/02/2022   Chronic pain 08/02/2022   Normocytic anemia 08/02/2022   Lung neoplasm 08/02/2022   PCP:  Lauro Regulus, MD Pharmacy:   [id number] [place], [place] - [address] AT [facility] OF [address] [place] [address] Phone: [phone] Fax: [phone]  [facility] - [place], [place] - [address] [address] [place], [address] [place] [address] Phone: [phone] Fax: [phone]  [place] [facility] [address] [place] [address] Phone: [phone] Fax: [phone]     Social Determinants of Health (SDOH) Social History: SDOH Screenings   Food Insecurity: No Food Insecurity (10/25/2022)  Housing: Low Risk  (10/25/2022)  Transportation Needs: No Transportation Needs (10/25/2022)  Utilities: Not At Risk (10/25/2022)  Financial Resource Strain: Low Risk  (08/10/2022)   Received from [facility], [facility]  Tobacco Use: Medium Risk (10/25/2022)   SDOH Interventions:     Readmission Risk Interventions     No data to display

## 2022-10-27 NOTE — Evaluation (Signed)
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 Pt is oriented to self, lethargic throughout, wife present. Pt does not wake up to voice, but will wake up to position changes and requires multi modal cues to remain awake. PTA pt was generally MOD I in ADL, however over the last few weeks with increasing generalized weakness pt has required increased assist for all ADL/IADL. Pt wife reports he was taking minimal steps and had one controlled descent while trying to get to their car. Pt requires MAX A for supine>sit, MIN-MAX A for static sitting on edge of bed, MAX A for all ADLs at this time. Of note, pt was up in chair earlier requiring less assist and pt has not slept well. Pt presents with deficits in strength, endurance, activity tolerance, balance, cognition affecting safe and optimal ADL completion. Pt will benefit from ongoing OT to address deficits and to facitlite return to PLOF.       If plan is discharge home, recommend the following: A lot of help with walking and/or transfers;A lot of help with bathing/dressing/bathroom;Direct supervision/assist for financial management;Assistance with cooking/housework;Assistance with feeding;Help with stairs or ramp for entrance;Direct supervision/assist for medications management    Functional Status Assessment  Patient has had a recent decline in their functional status and demonstrates the ability to make significant  improvements in function in a reasonable and predictable amount of time.  Equipment Recommendations  Other (comment) (per next venue of care)    Recommendations for Other Services       Precautions / Restrictions Precautions Precautions: Fall Restrictions Weight Bearing Restrictions: No      Mobility Bed Mobility Overal bed mobility: Needs Assistance Bed Mobility: Supine to Sit, Sit to Supine     Supine to sit: Max assist, HOB elevated Sit to supine: Max assist, HOB elevated   General bed mobility comments: multi modal cues for technique    Transfers                   General transfer comment: pt unable to tolerate additional transfer attempts on this date, will continue to assess      Balance Overall balance assessment: Needs assistance Sitting-balance support: Bilateral upper extremity supported, Single extremity supported, Feet supported Sitting balance-Leahy Scale: Poor Sitting balance - Comments: posterior lean, requires at least MIN A, varying to MAX A seated on edge of bed; pt is fatigued                                   ADL either performed or assessed with clinical judgement   ADL Overall ADL's : Needs assistance/impaired     Grooming: Maximal assistance;Sitting           Upper Body Dressing : Maximal assistance Upper Body Dressing Details (indicate cue type and reason): anticipate Lower Body Dressing: Maximal assistance Lower Body Dressing Details (indicate cue type and reason): anticipate  Toileting- Clothing Manipulation and Hygiene: Maximal assistance         General ADL Comments: pt lethargic on eval affecting performance in all ADL tasks, anticipate MAX Z+6-1     Vision Patient Visual Report: No change from baseline Additional Comments: will continue to assess     Perception         Praxis         Pertinent Vitals/Pain Pain Assessment Pain Assessment: No/denies pain     Extremity/Trunk Assessment  Upper Extremity Assessment Upper Extremity Assessment: Generalized weakness   Lower Extremity Assessment Lower Extremity Assessment: Generalized weakness       Communication Communication Communication: Difficulty following commands/understanding Following commands: Follows one step commands with increased time Cueing Techniques: Verbal cues;Gestural cues;Tactile cues;Visual cues   Cognition Arousal: Lethargic Behavior During Therapy: Flat affect Overall Cognitive Status: Impaired/Different from baseline Area of Impairment: Orientation, Attention, Memory, Following commands, Safety/judgement, Awareness                 Orientation Level: Disoriented to, Place, Time, Situation Current Attention Level: Focused Memory: Decreased recall of precautions Following Commands: Follows one step commands with increased time (with multi modal cues) Safety/Judgement: Decreased awareness of deficits Awareness: Emergent         General Comments  pt spo2 6L >90% throughout, max HR 102 bpm    Exercises Other Exercises Other Exercises: edu pt and wife re: role of OT, role of rehab, discharge recommendations, environmental set up to improve cognition/decrease delirium risk   Shoulder Instructions      Home Living Family/patient expects to be discharged to:: Private residence Living Arrangements: Spouse/significant other Available Help at Discharge: Family Type of Home: House Home Access: Stairs to enter Secretary/administrator of Steps: 4 Entrance Stairs-Rails: Left                 Home Equipment: Agricultural consultant (2 wheels);Cane - single point          Prior Functioning/Environment Prior Level of Function : History of Falls (last six months)             Mobility Comments: amb with RW, over the last two weeks increased weakness ADLs Comments: generally MOD I with ADL, assist for IADL however decrease in functional status the last two weeks per wife report         OT Problem List: Decreased strength;Decreased activity tolerance;Impaired balance (sitting and/or standing);Decreased knowledge of use of DME or AE      OT Treatment/Interventions: Self-care/ADL training;Balance training;Therapeutic exercise;Therapeutic activities;Energy conservation;DME and/or AE instruction;Patient/family education    OT Goals(Current goals can be found in the care plan section) Acute Rehab OT Goals Patient Stated Goal: continue to work with therapy OT Goal Formulation: With patient/family Time For Goal Achievement: 11/10/22 Potential to Achieve Goals: Good ADL Goals Pt Will Perform Grooming: sitting;with supervision Pt Will Perform Lower Body Dressing: with min assist;sitting/lateral leans Pt Will Transfer to Toilet: with min assist Pt Will Perform Toileting - Clothing Manipulation and hygiene: with min assist;sitting/lateral leans  OT Frequency: Min 1X/week    Co-evaluation              AM-PAC OT "6 Clicks" Daily Activity     Outcome Measure Help from another person eating meals?: A Lot Help from another person taking care of personal grooming?: A Lot Help from another person toileting, which includes using toliet, bedpan, or urinal?: A Lot Help from another person bathing (including washing, rinsing, drying)?: A Lot Help from  another person to put on and taking off regular upper body clothing?: A Lot Help from another person to put on and taking off regular lower body clothing?: A Lot 6 Click Score: 12   End of Session Equipment Utilized During Treatment: Oxygen Nurse Communication: Mobility status  Activity Tolerance: Patient limited by fatigue Patient left: in bed;with call bell/phone within reach;with bed alarm set  OT Visit Diagnosis: Muscle weakness (generalized) (M62.81)                Time: 7829-5621 OT Time Calculation (min): 18 min Charges:  OT General Charges $OT Visit: 1 Visit OT Evaluation $OT Eval Moderate Complexity: 1 Mod  Oleta Mouse, OTD OTR/L  10/27/22, 3:31 PM

## 2022-10-27 NOTE — Consult Note (Signed)
Pharmacy Consult Note - Anticoagulation  Pharmacy Consult for heparin Indication: pulmonary embolus  PATIENT MEASUREMENTS: Height: 6' (182.9 cm) Weight: 78.3 kg (172 lb 9.9 oz) IBW/kg (Calculated) : 77.6 HEPARIN DW (KG): 72.1 9/4 Heparin DW (KG): 78.3 kg  VITAL SIGNS: Temp: 98 F (36.7 C) (09/05 1656) Temp Source: Oral (09/05 1656) BP: 102/77 (09/05 1656) Pulse Rate: 95 (09/05 2040)  Recent Labs    10/25/22 1329 10/26/22 0052 10/26/22 0355 10/26/22 1000 10/27/22 0622 10/27/22 1121 10/27/22 2024  HGB 8.9*  --   --   --  10.5*  --   --   HCT 27.5*  --   --   --  32.4*  --   --   PLT 261  --   --   --  373  --   --   LABPROT 18.9*  --   --   --   --   --   --   INR 1.6*  --   --   --   --   --   --   HEPARINUNFRC  --    < >  --    < >  --    < > >1.10*  CREATININE 0.93  --  0.57*  --   --   --   --    < > = values in this interval not displayed.    Estimated Creatinine Clearance: 75.4 mL/min (A) (by C-G formula based on SCr of 0.57 mg/dL (L)).  PAST MEDICAL HISTORY: Past Medical History:  Diagnosis Date   Abnormal PSA    s/p post prostate biopsy in the past which was negative   Actinic keratosis 03/14/2007   L ant lat neck at base of neck - bx proven    Actinic keratosis 01/08/2014   R calf - bx proven    Alcoholism (HCC)    with prolonged hospitalization 2009 for withdrawal c/b aspiration pneumonia requiring vent   Anxiety    Atrial fibrillation and flutter (HCC)    pt denies afib.   B12 deficiency    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 male with PMH dementia is presenting with pulmonary embolism. CT chest shows acute bilateral peripheral pulmonary embolic, right side greater than left. Also, extensive interstitial and airspace opacities in left lung, with small pleural effusion and possible cavitation in left upper lobe.  Patient is not on chronic anticoagulation per chart review. Pharmacy has been consulted to initiate and manage heparin intravenous infusion.  Hemoglobin today is 8.9, and baseline appears to be 11-12 from June 2024. Will continue to monitor.  Pertinent medications: No chronic AC PTA per chart review  Goal(s) of therapy: Heparin level 0.3 - 0.7 units/mL Monitor platelets by anticoagulation protocol: Yes   Baseline anticoagulation labs: Recent Labs    10/25/22 1329 10/27/22 0622  INR 1.6*  --   HGB 8.9* 10.5*  PLT 261 373    Date Time aPTT/HL Rate/Comment 09/04 0052 HL 0.1  Subtherapeutic 09/04 1000 HL 0.42 Therapeutic x 1 @ 1350 un/hr 09/04 2300 HL < 0.1 Subtherapeutic 09/05 0106 HL < 0.1 Subtherapeutic lvl confirmed 09/05 1121 HL 0.22 Subtherapeutic, rate 1600 un/hr 09/05 2024  HL >  1.10 Supratherapeutic 1750  PLAN: Heparin supratherapeutic Suspect possible draw error Instead of stat repeat, will decrease heparin infusion slightly to 1700 units/hour Recheck heparin level in 8 hours after rate change, then daily once at least two levels are consecutively therapeutic. Monitor CBC daily while on heparin infusion.  Sharen Hones, PharmD, BCPS Clinical Pharmacist   10/27/2022 9:29 PM

## 2022-10-28 ENCOUNTER — Telehealth: Payer: Self-pay

## 2022-10-28 ENCOUNTER — Other Ambulatory Visit (HOSPITAL_COMMUNITY): Payer: Self-pay

## 2022-10-28 DIAGNOSIS — C7931 Secondary malignant neoplasm of brain: Secondary | ICD-10-CM | POA: Diagnosis not present

## 2022-10-28 DIAGNOSIS — C3492 Malignant neoplasm of unspecified part of left bronchus or lung: Secondary | ICD-10-CM | POA: Diagnosis not present

## 2022-10-28 DIAGNOSIS — E440 Moderate protein-calorie malnutrition: Secondary | ICD-10-CM | POA: Diagnosis not present

## 2022-10-28 DIAGNOSIS — I2699 Other pulmonary embolism without acute cor pulmonale: Secondary | ICD-10-CM | POA: Diagnosis not present

## 2022-10-28 LAB — CBC
HCT: 26.1 % — ABNORMAL LOW (ref 39.0–52.0)
Hemoglobin: 8.5 g/dL — ABNORMAL LOW (ref 13.0–17.0)
MCH: 28.7 pg (ref 26.0–34.0)
MCHC: 32.6 g/dL (ref 30.0–36.0)
MCV: 88.2 fL (ref 80.0–100.0)
Platelets: 315 10*3/uL (ref 150–400)
RBC: 2.96 MIL/uL — ABNORMAL LOW (ref 4.22–5.81)
RDW: 13.5 % (ref 11.5–15.5)
WBC: 13.9 10*3/uL — ABNORMAL HIGH (ref 4.0–10.5)
nRBC: 0 % (ref 0.0–0.2)

## 2022-10-28 LAB — HEPARIN LEVEL (UNFRACTIONATED): Heparin Unfractionated: 0.16 [IU]/mL — ABNORMAL LOW (ref 0.30–0.70)

## 2022-10-28 MED ORDER — HEPARIN BOLUS VIA INFUSION
2300.0000 [IU] | Freq: Once | INTRAVENOUS | Status: AC
  Administered 2022-10-28: 2300 [IU] via INTRAVENOUS
  Filled 2022-10-28: qty 2300

## 2022-10-28 MED ORDER — AFATINIB DIMALEATE 30 MG PO TABS: 30.0000 mg | ORAL_TABLET | Freq: Every day | ORAL | Status: DC

## 2022-10-28 MED ORDER — TROLAMINE SALICYLATE 10 % EX CREA
TOPICAL_CREAM | CUTANEOUS | Status: DC | PRN
  Filled 2022-10-28: qty 85

## 2022-10-28 MED ORDER — APIXABAN 5 MG PO TABS
10.0000 mg | ORAL_TABLET | Freq: Two times a day (BID) | ORAL | Status: DC
  Administered 2022-10-28 – 2022-10-29 (×4): 10 mg via ORAL
  Filled 2022-10-28 (×4): qty 2

## 2022-10-28 MED ORDER — APIXABAN 5 MG PO TABS: 5.0000 mg | ORAL_TABLET | Freq: Two times a day (BID) | ORAL | Status: DC

## 2022-10-28 NOTE — TOC Progression Note (Signed)
Transition of Care [facility]) - Progression Note    Patient Details  Name: Wesley Young MRN: [id number] Date of Birth: 11-29-1940  Transition of Care [facility]) CM/SW Contact  Margarito Liner, LCSW Phone Number: 10/28/2022, 1:38 PM  Clinical Narrative:  Reviewed bed offers with patient and his wife and they have chosen [facility]. Wife will bring oral chemo medication from home. Plan for discharge on Monday. CSW left message for SNF admissions coordinator to notify.  Expected Discharge Plan: Skilled Nursing Facility Barriers to Discharge: Continued Medical Work up  Expected Discharge Plan and Services     Post Acute Care Choice: Skilled Nursing Facility Living arrangements for the past 2 months: Single Family Home                                       Social Determinants of Health (SDOH) Interventions SDOH Screenings   Food Insecurity: No Food Insecurity (11/11/2022)  Housing: Low Risk  (10/24/2022)  Transportation Needs: No Transportation Needs (10/28/2022)  Utilities: Not At Risk (11/12/2022)  Financial Resource Strain: Low Risk  (08/10/2022)   Received from [facility], [facility]  Tobacco Use: Medium Risk (11/08/2022)    Readmission Risk Interventions     No data to display

## 2022-10-28 NOTE — Telephone Encounter (Signed)
-----   Message from Wesley Young sent at 10/28/2022  1:38 PM EDT ----- He is being discharged and going to rehab.  Please arrange him to follow up with me in 7-10 days lab MD cbc cmp thanks.  zy

## 2022-10-28 NOTE — Progress Notes (Signed)
Progress Note    10/28/2022 12:58 PM 2 Days Post-Op  Subjective:   Wesley Young is an 82 yo male now POD#2 from pulmonary Thrombectomy. Patient is recovering as expected. Patient remains on 5 liters nasal cannula oxygen due to left upper lobe malignancy and a super imposed pneumonia. Patient much more alert and awake this morning. Wife endorses patient was feeling better immediately afterward. No complaints overnight and vital all remain stable.    Vitals:   10/28/22 0800 10/28/22 1156  BP: 104/72   Pulse: 89   Resp: 20 20  Temp:  98.5 F (36.9 C)  SpO2: 97%    Physical Exam: Cardiac:  RRR, Normal S1, S2. No Murmurs Lungs:  On auscultation patient with scatter rhonchi throughout. Diminished in left upper lobe.  Incisions:  Right groin with dressing clean dry and intact. No hematoma or seroma Extremities:  Bilateral lower extremities warm to touch and with palpable pulses. Abdomen:  Positive bowel sounds throughout, soft , non tender and non distended Neurologic: AAOX 2 name and place only. Sitting up in bed and more alert today.   CBC    Component Value Date/Time   WBC 13.9 (H) 10/28/2022 0636   RBC 2.96 (L) 10/28/2022 0636   HGB 8.5 (L) 10/28/2022 0636   HGB 9.9 (L) 10/06/2022 1026   HCT 26.1 (L) 10/28/2022 0636   PLT 315 10/28/2022 0636   PLT 283 10/06/2022 1026   MCV 88.2 10/28/2022 0636   MCH 28.7 10/28/2022 0636   MCHC 32.6 10/28/2022 0636   RDW 13.5 10/28/2022 0636   LYMPHSABS 0.1 (L) 11/11/2022 1329   MONOABS 1.0 11/06/2022 1329   EOSABS 0.3 11/07/2022 1329   BASOSABS 0.0 10/24/2022 1329    BMET    Component Value Date/Time   NA 135 11/03/2022 0355   K 3.7 11/07/2022 0355   CL 102 11/21/2022 0355   CO2 23 11/06/2022 0355   GLUCOSE 119 (H) 10/23/2022 0355   BUN 14 11/01/2022 0355   CREATININE 0.57 (L) 11/08/2022 0355   CREATININE 0.69 10/06/2022 1026   CALCIUM 8.8 (L) 11/14/2022 0355   GFRNONAA >60 11/09/2022 0355   GFRNONAA >60 10/06/2022 1026    GFRAA >60 08/21/2019 1132    INR    Component Value Date/Time   INR 1.6 (H) 11/21/2022 1329     Intake/Output Summary (Last 24 hours) at 10/28/2022 1258 Last data filed at 10/28/2022 0227 Gross per 24 hour  Intake 240 ml  Output 400 ml  Net -160 ml     Assessment/Plan:  82 y.o. male is s/p Pulmonary Thrombectomy  2 Days Post-Op   PLAN: Continue oral anticoagulation. Continue PT/OT Wean off Oxygen as tolerated.   DVT prophylaxis: Eliquis 10 mg twice daily.    Marcie [facility] 10/28/2022 12:58 PM

## 2022-10-28 NOTE — Progress Notes (Addendum)
[facility]  - [place] [facility]   PATIENT NAME: Wesley Young    MR#:  [id number]  DATE OF BIRTH:  [birth year]  SUBJECTIVE:  Wife at bedside per RN patient has not been agitated. Appears weak and tired. Tolerating PO diet some along with ensure. Will stop IV heparin drip and start PO eliquis. Continue physical therapy. No fever. Just rehab option with wife.   VITALS:  Blood pressure 104/72, pulse 89, temperature 98.5 F (36.9 C), temperature source Oral, resp. rate 20, height 6' (1.829 m), weight 78.3 kg, SpO2 97%.  PHYSICAL EXAMINATION:  appears weak GENERAL:  82 y.o.-year-old patient with no acute distress.  LUNGS: decreased breath sounds bilaterally CARDIOVASCULAR: S1, S2 normal. No murmur, mild tachycardia ABDOMEN: Soft, nontender, nondistended.  EXTREMITIES: No  edema b/l.    NEUROLOGIC: nonfocal  patient is alert and awake, deconditioned  LABORATORY PANEL:  CBC Recent Labs  Lab 10/28/22 0636  WBC 13.9*  HGB 8.5*  HCT 26.1*  PLT 315    Chemistries  Recent Labs  Lab 27-Oct-2022 1329 11/10/2022 0355  NA 133* 135  K 3.7 3.7  CL 99 102  CO2 21* 23  GLUCOSE 212* 119*  BUN 17 14  CREATININE 0.93 0.57*  CALCIUM 9.1 8.8*  AST 33  --   ALT 37  --   ALKPHOS 83  --   BILITOT 0.7  --    Cardiac Enzymes No results for input(s): "TROPONINI" in the last 168 hours. RADIOLOGY:  PERIPHERAL VASCULAR CATHETERIZATION  Result Date: 10/24/2022 See surgical note for result.   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   Bilateral pulmonary embolism (HCC) Right LE DVT (posterior Tibial vein) --Heparin gtt --Echo showed EF 60-65% , mild LVH (grade 1 DD) --Vascular consultation with Dr. Wyn Quaker. Patient is status post Mechanical thrombectomy  using the penumbra CAT 8 device to the right lower lobe, middle lobe, and upper lobe pulmonary arteries in the left lower lobe pulmonary artery  -- try to wean oxygen down as able to. Will continue heparin drip for today and transition to oral eliquis tomorrow -- vascular okay to transition to eliquis.  Acute hypoxic respiratory failure secondary to Right sided pneumonia and bilateral pulmonary embolism -- white count elevated, chest x-ray shows left sided infiltrate -- continue IV cefepime -- white count trending down. Continue incentive spirometer. -- Blood culture negative --  Metastatic squamous cell carcinoma lung (HCC) --Outpatient follow-up with hematology/oncology-- Dr. Cathie Hoops --Home afatinib ok to resume per oncology Dr Bethanne Ginger recommendation (secure chat) --pt has h/o brain radiation   Essential hypertension --Losartan    Chronic pain --Tramadol prn    OSA on CPAP --CPAP nightly ordered  H/o GERD, Esophageal varices --PPI   Generalized weakness with debility -- PT OT input appreciated. Discussed benefits of rehab with patient and wife there in agreement. TOC for discharge planning to rehab       Procedures:Mechanical thrombectomy for PE Family communication : wife at bedside Consults : vascular CODE STATUS: full DVT Prophylaxis : eliquis Level of care: Progressive Status is: Inpatient Remains inpatient appropriate because: bilateral PE, pneumonia, DVT    TOTAL TIME TAKING CARE OF THIS PATIENT: 35 minutes.  >50% time spent on counselling and coordination of care  Note: This dictation was  prepared with Dragon dictation along with smaller phrase technology. Any transcriptional errors that result from this process are unintentional.  Enedina Finner M.D    [facility]   CC: Primary care physician; Lauro Regulus, MD

## 2022-10-28 NOTE — Progress Notes (Addendum)
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 Pt performed supine>sit with minA for transition to sitting, sit<>stand from EOB modAx2/ RW, and step pivot to recliner minAx2/RW. Pt able ambulate ~34ft with CGA/RW, needing maximal multimodal cues for step length, maintaining upright trunk,  and coordinating RW. Throughout treatment, pt showed deficits in motor planning and required maximal/repeated multimodal cueing from therapist for hand placement, form, and to follow simple one step commands. He would benefit from continued skilled therapy to maximize functional abilities.   Pt on 4L O2 throughout session, SPO2 with some drops into 80s with activities but with rest and cues for PLB, improved to >90%.    If plan is discharge home, recommend the following: A lot of help with walking and/or transfers;A lot of help with bathing/dressing/bathroom;Assistance with cooking/housework;Direct supervision/assist for medications management;Assist for transportation;Help with stairs or ramp for entrance   Can travel by private vehicle     No  Equipment Recommendations  Other (comment) (TBD at next venue)    Recommendations for Other Services       Precautions / Restrictions Precautions Precautions: Fall Restrictions Weight Bearing Restrictions: No     Mobility  Bed Mobility Overal bed mobility: Needs Assistance Bed Mobility: Supine to Sit     Supine to sit: Min  assist     General bed mobility comments: multimodal cueing for technique/hand placement, minA for transition to sitting    Transfers Overall transfer level: Needs assistance Equipment used: Rolling walker (2 wheels) Transfers: Sit to/from Stand, Bed to chair/wheelchair/BSC Sit to Stand: Mod assist, +2 physical assistance, Min assist   Step pivot transfers: Min assist, +2 physical assistance       General transfer comment: sit<>stand: modAx2 from EOB and progressed to minAx1 from recliner, maximal multimodal cueing for hand placement    Ambulation/Gait Ambulation/Gait assistance: Contact guard assist Gait Distance (Feet): 5 Feet Assistive device: Rolling walker (2 wheels)   Gait velocity: decreased     General Gait Details: limited by fatigue/ cognitive state, required maximal multimodal cueing for step length, RW direction, and hand placement   Stairs             Wheelchair Mobility     Tilt Bed    Modified Rankin (Stroke Patients Only)       Balance Overall balance assessment: Needs assistance Sitting-balance support: Bilateral upper extremity supported, Single extremity supported, Feet supported Sitting balance-Leahy Scale: Poor Sitting balance - Comments: maxA for initial sitting, fluctuated maxA-CGA Postural control: Posterior lean Standing balance support: Bilateral upper extremity supported, During functional activity, Reliant on assistive device for balance Standing balance-Leahy Scale: Poor                              Cognition Arousal: Alert Behavior During Therapy: Flat affect Overall Cognitive Status: Impaired/Different from baseline Area of Impairment: Orientation, Following commands  Orientation Level: Disoriented to, Place, Time, Situation     Following Commands: Follows one step commands with increased time, Follows one step commands inconsistently       General Comments: required frequent  multimodal cueing        Exercises      General Comments General comments (skin integrity, edema, etc.): Pt on 4L O2, SPO2 initial sitting: 88% but after a few minutes >90%, standing mobility remained mostly >90% but dropped to ~84% briefly in sitting after ambulation which recovered with facilitation of nasal breathing. Pt left with SPO2 >92%      Pertinent Vitals/Pain Pain Assessment Pain Assessment: No/denies pain    Home Living                          Prior Function            PT Goals (current goals can now be found in the care plan section) Acute Rehab PT Goals Patient Stated Goal: return home Progress towards PT goals: Progressing toward goals    Frequency    Min 1X/week      PT Plan      Co-evaluation              AM-PAC PT "6 Clicks" Mobility   Outcome Measure  Help needed turning from your back to your side while in a flat bed without using bedrails?: A Little Help needed moving from lying on your back to sitting on the side of a flat bed without using bedrails?: A Little Help needed moving to and from a bed to a chair (including a wheelchair)?: A Lot Help needed standing up from a chair using your arms (e.g., wheelchair or bedside chair)?: A Lot Help needed to walk in hospital room?: A Lot Help needed climbing 3-5 steps with a railing? : Total 6 Click Score: 13    End of Session Equipment Utilized During Treatment: Gait belt Activity Tolerance: Patient limited by fatigue Patient left: in chair;with call bell/phone within reach;with chair alarm set;with family/visitor present   PT Visit Diagnosis: Other abnormalities of gait and mobility (R26.89);Muscle weakness (generalized) (M62.81);Difficulty in walking, not elsewhere classified (R26.2)     Time: 3664-4034 PT Time Calculation (min) (ACUTE ONLY): 27 min  Charges:    $Therapeutic Activity: 23-37 mins PT General Charges $$ ACUTE PT VISIT: 1 Visit                    Brytani Voth, PT, SPT 3:54 PM,10/28/22

## 2022-10-28 NOTE — Consult Note (Signed)
Pharmacy Consult Note - Anticoagulation  Pharmacy Consult for heparin Indication: pulmonary embolus  PATIENT MEASUREMENTS: Height: 6' (182.9 cm) Weight: 78.3 kg (172 lb 9.9 oz) IBW/kg (Calculated) : 77.6 HEPARIN DW (KG): 72.1 9/4 Heparin DW (KG): 78.3 kg  VITAL SIGNS: BP: 97/60 (09/06 0226) Pulse Rate: 89 (09/06 0226)  Recent Labs    10/28/2022 1329 11/04/2022 0052 10/29/2022 0355 11/15/2022 1000 10/28/22 0636  HGB 8.9*  --   --    < > 8.5*  HCT 27.5*  --   --    < > 26.1*  PLT 261  --   --    < > 315  LABPROT 18.9*  --   --   --   --   INR 1.6*  --   --   --   --   HEPARINUNFRC  --    < >  --    < > 0.16*  CREATININE 0.93  --  0.57*  --   --    < > = values in this interval not displayed.    Estimated Creatinine Clearance: 75.4 mL/min (A) (by C-G formula based on SCr of 0.57 mg/dL (L)).  PAST MEDICAL HISTORY: Past Medical History:  Diagnosis Date   Abnormal PSA    s/p post prostate biopsy in the past which was negative   Actinic keratosis 03/14/2007   L ant lat neck at base of neck - bx proven    Actinic keratosis 01/08/2014   R calf - bx proven    Alcoholism (HCC)    with prolonged hospitalization 2009 for withdrawal c/b aspiration pneumonia requiring vent   Anxiety    Atrial fibrillation and flutter (HCC)    pt denies afib.   B12 deficiency    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 male with PMH dementia is presenting with pulmonary embolism. CT chest shows acute bilateral peripheral pulmonary embolic, right side greater than left. Also, extensive interstitial and airspace opacities in left  lung, with small pleural effusion and possible cavitation in left upper lobe.  Patient is not on chronic anticoagulation per chart review. Pharmacy has been consulted to initiate and manage heparin intravenous infusion.  Hemoglobin today is 8.9, and baseline appears to be 11-12 from June 2024. Will continue to monitor.  Pertinent medications: No chronic AC PTA per chart review  Goal(s) of therapy: Heparin level 0.3 - 0.7 units/mL Monitor platelets by anticoagulation protocol: Yes   Baseline anticoagulation labs: Recent Labs    2022/11/04 1329 10/27/22 0622 10/28/22 0636  INR 1.6*  --   --   HGB 8.9* 10.5* 8.5*  PLT 261 373 315    Date Time aPTT/HL Rate/Comment 09/04 0052 HL 0.1  Subtherapeutic 09/04 1000 HL 0.42 Therapeutic x 1 @ 1350 un/hr 09/04 2300 HL < 0.1 Subtherapeutic 09/05 0106 HL < 0.1 Subtherapeutic lvl confirmed 09/05 1121 HL 0.22 Subtherapeutic, rate 1600 un/hr 09/05 2024  HL > 1.10 Supratherapeutic 1750 (supra-therapeutic HL suspected in error but not repeated) 09/05 0636 HL 0.16 Subtherapeutic, rate 1700 un/hr  PLAN: Heparin  2300 units IV bols x 1, then  Increase heparin infusion rate to 1850 units/hour Recheck heparin level in 8 hours after rate change, then daily once at least two levels are consecutively therapeutic. Monitor CBC daily while on heparin infusion.  Najee Cowens Rodriguez-Guzman PharmD, BCPS 10/28/2022 7:23 AM

## 2022-10-28 NOTE — Progress Notes (Signed)
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 Pt requires increased time for processing, inconsistent one step direction following. MOD A required for grooming tasks, STS with MOD A with MAX A required for LB bathing. MOD A required for UB bathing/dressing tasks. Pt is left as received, all needs met. OT will continue to follow acutely.       If plan is discharge home, recommend the following:  A lot of help with walking and/or transfers;A lot of help with bathing/dressing/bathroom;Direct supervision/assist for financial management;Assistance with cooking/housework;Assistance with feeding;Help with stairs or ramp for entrance;Direct supervision/assist for medications management   Equipment Recommendations  Other (comment) (per next venue of care)    Recommendations for Other Services      Precautions / Restrictions Precautions Precautions: Fall Restrictions Weight Bearing Restrictions: No       Mobility Bed Mobility               General bed mobility comments: NT in recliner pre/post session    Transfers Overall transfer level: Needs assistance   Transfers: Sit to/from Stand Sit to Stand: Mod assist           General transfer comment: with step by step multi modal cues     Balance Overall balance assessment: Needs assistance Sitting-balance support: Bilateral upper extremity supported, Single extremity supported, Feet supported Sitting  balance-Leahy Scale: Fair   Postural control: Posterior lean Standing balance support: Bilateral upper extremity supported, During functional activity, Reliant on assistive device for balance Standing balance-Leahy Scale: Poor                             ADL either performed or assessed with clinical judgement   ADL Overall ADL's : Needs assistance/impaired     Grooming: Moderate assistance;Sitting;Wash/dry hands;Wash/dry face;Oral care Grooming Details (indicate cue type and reason): Pt attempting to utilize tooth brush as a hair brush, requires multi modal cues for sequencing Upper Body Bathing: Moderate assistance;Sitting   Lower Body Bathing: Maximal assistance;Sit to/from stand   Upper Body Dressing : Moderate assistance;Sitting Upper Body Dressing Details (indicate cue type and reason): doff/donn gown Lower Body Dressing: Maximal assistance       Toileting- Clothing Manipulation and Hygiene: Maximal assistance;Sit to/from stand              Extremity/Trunk Assessment              Vision       Perception     Praxis      Cognition Arousal: Lethargic Behavior During Therapy: Flat affect Overall Cognitive Status: Impaired/Different from baseline Area of Impairment: Orientation, Attention, Memory, Following commands, Safety/judgement, Awareness, Problem solving                 Orientation Level: Disoriented to, Place, Time, Situation Current Attention Level: Focused Memory: Decreased short-term memory, Decreased recall of precautions Following Commands: Follows one step commands with increased time, Follows one step  commands inconsistently Safety/Judgement: Decreased awareness of deficits Awareness: Emergent Problem Solving: Slow processing, Decreased initiation, Difficulty sequencing, Requires verbal cues, Requires tactile cues General Comments: frequent multi modal cues        Exercises      Shoulder Instructions        General Comments pt on 4L via [place] >90% throughout, pt falling asleep throughout session    Pertinent Vitals/ Pain       Pain Assessment Pain Assessment: No/denies pain  Home Living                                          Prior Functioning/Environment              Frequency  Min 1X/week        Progress Toward Goals  OT Goals(current goals can now be found in the care plan section)  Progress towards OT goals: Progressing toward goals     Plan      Co-evaluation                 AM-PAC OT "6 Clicks" Daily Activity     Outcome Measure   Help from another person eating meals?: A Lot Help from another person taking care of personal grooming?: A Lot Help from another person toileting, which includes using toliet, bedpan, or urinal?: A Lot Help from another person bathing (including washing, rinsing, drying)?: A Lot Help from another person to put on and taking off regular upper body clothing?: A Lot Help from another person to put on and taking off regular lower body clothing?: A Lot 6 Click Score: 12    End of Session Equipment Utilized During Treatment: Oxygen  OT Visit Diagnosis: Muscle weakness (generalized) (M62.81)   Activity Tolerance Patient limited by fatigue   Patient Left in chair;with call bell/phone within reach;with chair alarm set;with family/visitor present   Nurse Communication Mobility status        Time: 1610-9604 OT Time Calculation (min): 24 min  Charges: OT General Charges $OT Visit: 1 Visit OT Treatments $Self Care/Home Management : 23-37 mins  Oleta Mouse, OTD OTR/L  10/28/22, 4:07 PM

## 2022-10-28 NOTE — TOC Benefit Eligibility Note (Signed)
Pharmacy Patient Advocate Encounter  Insurance verification completed.    The patient is insured through [facility]   Ran test claim for Eliquis. Currently a quantity of 60 is a 30 day supply and the co-pay is $0.00 .   This test claim was processed through [facility]- copay amounts may vary at other pharmacies due to pharmacy/plan contracts, or as the patient moves through the different stages of their insurance plan.

## 2022-10-29 DIAGNOSIS — C3492 Malignant neoplasm of unspecified part of left bronchus or lung: Secondary | ICD-10-CM | POA: Diagnosis not present

## 2022-10-29 DIAGNOSIS — I2699 Other pulmonary embolism without acute cor pulmonale: Secondary | ICD-10-CM | POA: Diagnosis not present

## 2022-10-29 DIAGNOSIS — J189 Pneumonia, unspecified organism: Secondary | ICD-10-CM

## 2022-10-29 DIAGNOSIS — C7931 Secondary malignant neoplasm of brain: Secondary | ICD-10-CM | POA: Diagnosis not present

## 2022-10-29 DIAGNOSIS — E440 Moderate protein-calorie malnutrition: Secondary | ICD-10-CM | POA: Diagnosis not present

## 2022-10-29 MED ORDER — CEFUROXIME AXETIL 500 MG PO TABS
500.0000 mg | ORAL_TABLET | Freq: Two times a day (BID) | ORAL | Status: DC
  Filled 2022-10-29: qty 1

## 2022-10-29 NOTE — Plan of Care (Signed)
  Problem: Education: Goal: Knowledge of General Education information will improve Description: Including pain rating scale, medication(s)/side effects and non-pharmacologic comfort measures Outcome: Progressing   Problem: Clinical Measurements: Goal: Ability to maintain clinical measurements within normal limits will improve Outcome: Progressing Goal: Will remain free from infection Outcome: Progressing Goal: Diagnostic test results will improve Outcome: Progressing Goal: Respiratory complications will improve Outcome: Progressing Goal: Cardiovascular complication will be avoided Outcome: Progressing   Problem: Nutrition: Goal: Adequate nutrition will be maintained Outcome: Progressing   Problem: Elimination: Goal: Will not experience complications related to bowel motility Outcome: Progressing Goal: Will not experience complications related to urinary retention Outcome: Progressing   Problem: Pain Managment: Goal: General experience of comfort will improve Outcome: Progressing   Problem: Safety: Goal: Ability to remain free from injury will improve Outcome: Progressing   Problem: Skin Integrity: Goal: Risk for impaired skin integrity will decrease Outcome: Progressing

## 2022-10-29 NOTE — Progress Notes (Addendum)
[facility]  - [place] at [facility]   PATIENT NAME: Wesley Young    MR#:  [id number]  DATE OF BIRTH:  May 15, 1940  SUBJECTIVE:  no family at bedside. RN trying to give meds. Patient is sleepy. Per RN refused to wear CPAP last night. Sats stable on 3 Liter [place] oxygen No fever  VITALS:  Blood pressure 129/70, pulse 89, temperature 98.6 F (37 C), temperature source Axillary, resp. rate (!) 23, height 6' (1.829 m), weight 78.3 kg, SpO2 91%.  PHYSICAL EXAMINATION:  appears weak GENERAL:  82 y.o.-year-old patient with no acute distress.  LUNGS: decreased breath sounds bilaterally CARDIOVASCULAR: S1, S2 normal. No murmur, mild tachycardia ABDOMEN: Soft, nontender, nondistended.  EXTREMITIES: No  edema b/l.    NEUROLOGIC: nonfocal  patient is sleepy but answers appropriately deconditioned  LABORATORY PANEL:  CBC Recent Labs  Lab 10/28/22 0636  WBC 13.9*  HGB 8.5*  HCT 26.1*  PLT 315    Chemistries  Recent Labs  Lab 11-13-22 1329 11/07/2022 0355  NA 133* 135  K 3.7 3.7  CL 99 102  CO2 21* 23  GLUCOSE 212* 119*  BUN 17 14  CREATININE 0.93 0.57*  CALCIUM 9.1 8.8*  AST 33  --   ALT 37  --   ALKPHOS 83  --   BILITOT 0.7  --    Cardiac Enzymes No results for input(s): "TROPONINI" in the last 168 hours. RADIOLOGY:  No results found.  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   Bilateral pulmonary embolism (HCC) Right LE DVT (posterior Tibial vein) --Heparin gtt --Echo showed EF 60-65% , mild LVH (grade 1 DD) --Vascular consultation with Dr. Wyn Quaker. Patient is status post Mechanical thrombectomy using the penumbra CAT 8 device to the right lower lobe, middle lobe, and upper lobe pulmonary arteries in the left lower lobe pulmonary artery   -- try to wean oxygen down as able to. Will continue heparin drip for today and transition to oral eliquis tomorrow -- vascular okay to transition to eliquis.  Acute hypoxic respiratory failure secondary to Right sided pneumonia and bilateral pulmonary embolism -- white count elevated, chest x-ray shows left sided infiltrate -- continue IV cefepime -- white count trending down. Continue incentive spirometer. -- Blood culture negative WBC trending down. No fever. Switch to oral antibiotic -- --  Metastatic squamous cell carcinoma lung (HCC) --Outpatient follow-up with hematology/oncology-- Dr. Cathie Hoops --Home afatinib ok to resume per oncology Dr Bethanne Ginger recommendation (secure chat) --pt has h/o brain radiation   Essential hypertension --Losartan    Chronic pain --Tramadol prn    OSA on CPAP --CPAP nightly ordered  H/o GERD, Esophageal varices --PPI   Generalized weakness with debility -- PT OT input appreciated. Discussed benefits of rehab with patient and wife there in agreement. TOC for discharge planning to rehab  Nutrition Status: Nutrition Problem: Moderate Malnutrition Etiology: chronic illness (cancer and cancer related treatments) Signs/Symptoms: mild fat depletion, mild muscle depletion Interventions: Ensure Enlive (each supplement provides 350kcal and 20 grams of protein), MVI, Liberalize Diet Follow RD recs   Procedures:Mechanical thrombectomy for PE Family communication : none today Consults : vascular CODE STATUS: full DVT Prophylaxis : eliquis Level of care: Progressive Status is: Inpatient Remains inpatient appropriate because: current management for bilateral PE, pneumonia,  DVT awaiting insurance authorization and discharge to rehab next week  TOTAL TIME TAKING CARE OF THIS PATIENT: 84 minutes.  >50% time spent on counselling and coordination of care  Note: This dictation was prepared with Dragon dictation along with smaller phrase technology. Any  transcriptional errors that result from this process are unintentional.  Enedina Finner M.D    [facility]   CC: Primary care physician; Lauro Regulus, MD

## 2022-10-29 NOTE — Plan of Care (Signed)

## 2022-10-30 ENCOUNTER — Inpatient Hospital Stay: Payer: Medicare Other

## 2022-10-30 DIAGNOSIS — I639 Cerebral infarction, unspecified: Secondary | ICD-10-CM

## 2022-10-30 DIAGNOSIS — I2699 Other pulmonary embolism without acute cor pulmonale: Secondary | ICD-10-CM | POA: Diagnosis not present

## 2022-10-30 DIAGNOSIS — L8990 Pressure ulcer of unspecified site, unspecified stage: Secondary | ICD-10-CM | POA: Insufficient documentation

## 2022-10-30 DIAGNOSIS — C3492 Malignant neoplasm of unspecified part of left bronchus or lung: Secondary | ICD-10-CM | POA: Diagnosis not present

## 2022-10-30 DIAGNOSIS — C7931 Secondary malignant neoplasm of brain: Secondary | ICD-10-CM | POA: Diagnosis not present

## 2022-10-30 DIAGNOSIS — E440 Moderate protein-calorie malnutrition: Secondary | ICD-10-CM | POA: Diagnosis not present

## 2022-10-30 LAB — APTT
aPTT: 37 s — ABNORMAL HIGH (ref 24–36)
aPTT: 55 s — ABNORMAL HIGH (ref 24–36)

## 2022-10-30 LAB — GLUCOSE, CAPILLARY
Glucose-Capillary: 121 mg/dL — ABNORMAL HIGH (ref 70–99)
Glucose-Capillary: 150 mg/dL — ABNORMAL HIGH (ref 70–99)

## 2022-10-30 LAB — BASIC METABOLIC PANEL
Anion gap: 9 (ref 5–15)
BUN: 24 mg/dL — ABNORMAL HIGH (ref 8–23)
CO2: 21 mmol/L — ABNORMAL LOW (ref 22–32)
Calcium: 9 mg/dL (ref 8.9–10.3)
Chloride: 109 mmol/L (ref 98–111)
Creatinine, Ser: 0.56 mg/dL — ABNORMAL LOW (ref 0.61–1.24)
GFR, Estimated: >60 mL/min (ref >60)
Glucose, Bld: 122 mg/dL — ABNORMAL HIGH (ref 70–99)
Potassium: 3.9 mmol/L (ref 3.5–5.1)
Sodium: 139 mmol/L (ref 135–145)

## 2022-10-30 LAB — CULTURE, BLOOD (ROUTINE X 2)
Culture: NO GROWTH
Culture: NO GROWTH
Special Requests: ADEQUATE
Special Requests: ADEQUATE

## 2022-10-30 LAB — CBC
HCT: 30.1 % — ABNORMAL LOW (ref 39.0–52.0)
Hemoglobin: 9.4 g/dL — ABNORMAL LOW (ref 13.0–17.0)
MCH: 28.1 pg (ref 26.0–34.0)
MCHC: 31.2 g/dL (ref 30.0–36.0)
MCV: 90.1 fL (ref 80.0–100.0)
Platelets: 363 10*3/uL (ref 150–400)
RBC: 3.34 MIL/uL — ABNORMAL LOW (ref 4.22–5.81)
RDW: 14 % (ref 11.5–15.5)
WBC: 14.4 10*3/uL — ABNORMAL HIGH (ref 4.0–10.5)
nRBC: 0 % (ref 0.0–0.2)

## 2022-10-30 LAB — HEPARIN LEVEL (UNFRACTIONATED)
Heparin Unfractionated: >1.1 [IU]/mL — ABNORMAL HIGH (ref 0.30–0.70)
Heparin Unfractionated: >1.1 [IU]/mL — ABNORMAL HIGH (ref 0.30–0.70)

## 2022-10-30 MED ORDER — SODIUM CHLORIDE 0.9 % IV SOLN: INTRAVENOUS | Status: DC

## 2022-10-30 MED ORDER — HEPARIN (PORCINE) 25000 UT/250ML-% IV SOLN
1350.0000 [IU]/h | INTRAVENOUS | Status: DC
  Administered 2022-10-30: 950 [IU]/h via INTRAVENOUS
  Administered 2022-10-31: 1250 [IU]/h via INTRAVENOUS
  Administered 2022-11-01: 1300 [IU]/h via INTRAVENOUS
  Administered 2022-11-02: 1350 [IU]/h via INTRAVENOUS
  Filled 2022-10-30 (×4): qty 250

## 2022-10-30 MED ORDER — SODIUM CHLORIDE 0.9 % IV SOLN
2.0000 g | Freq: Three times a day (TID) | INTRAVENOUS | Status: DC
  Administered 2022-10-30 – 2022-11-02 (×10): 2 g via INTRAVENOUS
  Filled 2022-10-30 (×12): qty 12.5

## 2022-10-30 MED ORDER — PANTOPRAZOLE SODIUM 40 MG IV SOLR
40.0000 mg | Freq: Every day | INTRAVENOUS | Status: DC
  Administered 2022-10-30 – 2022-11-02 (×4): 40 mg via INTRAVENOUS
  Filled 2022-10-30 (×3): qty 10

## 2022-10-30 MED ORDER — DEXTROSE-SODIUM CHLORIDE 5-0.45 % IV SOLN: INTRAVENOUS | Status: DC

## 2022-10-30 NOTE — Consult Note (Signed)
NEURO HOSPITALIST CONSULT NOTE   Requesting physician: Dr. Allena Katz  Reason for Consult: Acute infarcts on MRI  History obtained from:  Wife and Chart     HPI:                                                                                                                                          Wesley Young is an 82 y.o. 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 He underwent pulmonary mechanical thrombectomy on 9/4 using the penumbra CAT 8 device to the right lower lobe, middle lobe, and upper lobe pulmonary arteries in addition to the left lower lobe pulmonary artery. He also has been diagnosed with a RLE DVT. He is being treated with IV cefepime for right sided pneumonia. He has been anticoagulated on IV heparin and plan had been to transition to oral Eliquis today. Unfortunately, an MRI brain obtained this morning after he deteriorated overnight revealed multifocal acute ischemic infarctions. Neurology has been consulted for stroke work up.   He is on the chemotherapeutic agent afatinib for his lung cancer, which was initially held this admission and has since been restarted.   He also has a history listed in Epic of atrial fibrillation and flutter, which is equivocal, as there is a footnote under the entry that states "pt denies afib". EKG on admission showed sinus rhythm.   Past Medical History:  Diagnosis Date   Abnormal PSA    s/p post prostate biopsy in the past which was negative   Actinic keratosis 03/14/2007   L ant lat neck at base  of neck - bx proven    Actinic keratosis 01/08/2014   R calf - bx proven    Alcoholism (HCC)    with prolonged hospitalization 2009 for withdrawal c/b aspiration pneumonia requiring vent   Anxiety    Atrial fibrillation and flutter (HCC)    pt denies afib.   B12 deficiency    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/2015   L upper back paraspinal - excision    Hx of squamous cell carcinoma of skin 2014   multiple sites   Hypertension    Impotence    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HCC)    Sleep apnea    Spinal stenosis    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   L forearm - excision    Squamous cell carcinoma of skin 12/16/2015   R dorsum hand    Squamous cell carcinoma of skin 10/02/2017   R dorsum hand    Varicose veins of both lower extremities     Past Surgical History:  Procedure Laterality Date   CATARACT EXTRACTION W/PHACO Left 03/12/2019   Procedure: CATARACT EXTRACTION PHACO AND INTRAOCULAR LENS PLACEMENT (IOC) LEFT 5.27 00:38.6;  Surgeon: Galen Manila, MD;  Location: [facility];  Service: Ophthalmology;  Laterality: Left;  sleep apnea   CATARACT EXTRACTION W/PHACO Right 04/09/2019   Procedure: CATARACT EXTRACTION  PHACO AND INTRAOCULAR LENS PLACEMENT (IOC) RIGHT 4.84 00:52.4;  Surgeon: Galen Manila, MD;  Location: [facility];  Service: Ophthalmology;  Laterality: Right;   colonoscopy with polypectomy  12/2016   COLONOSCOPY WITH PROPOFOL N/A 12/27/2016   Procedure: COLONOSCOPY WITH PROPOFOL;  Surgeon: Toledo, Boykin Nearing, MD;  Location: [facility];  Service: Endoscopy;  Laterality: N/A;   COLONOSCOPY WITH PROPOFOL N/A 03/23/2020   Procedure: COLONOSCOPY WITH PROPOFOL;  Surgeon: Toledo, Boykin Nearing, MD;  Location: [facility];  Service: Gastroenterology;  Laterality: N/A;   ESOPHAGOGASTRODUODENOSCOPY (EGD) WITH PROPOFOL N/A 09/21/2017   Procedure: ESOPHAGOGASTRODUODENOSCOPY (EGD) WITH PROPOFOL;  Surgeon: Christena Deem, MD;  Location: [facility];  Service: Endoscopy;  Laterality: N/A;   ESOPHAGOGASTRODUODENOSCOPY (EGD) WITH PROPOFOL N/A 03/23/2020   Procedure: ESOPHAGOGASTRODUODENOSCOPY (EGD) WITH PROPOFOL;  Surgeon: Toledo, Boykin Nearing, MD;  Location: [facility];  Service: Gastroenterology;  Laterality: N/A;   melonoma removal     MOHS SURGERY     on top of head   PORTA CATH INSERTION N/A 08/18/2022   Procedure: PORTA CATH INSERTION;  Surgeon: Annice Needy, MD;  Location: [facility];  Service: Cardiovascular;  Laterality: N/A;   prostat biopsy x2     PULMONARY THROMBECTOMY Bilateral 10/27/2022   Procedure: PULMONARY THROMBECTOMY;  Surgeon: Annice Needy, MD;  Location: [facility];  Service: Cardiovascular;  Laterality: Bilateral;   right deltoid resection in 1977     s/p resection of his right deltoid muscle in 1977     TONSILLECTOMY     TONSILLECTOMY AND ADENOIDECTOMY     VIDEO BRONCHOSCOPY WITH ENDOBRONCHIAL ULTRASOUND N/A 08/05/2022   Procedure: VIDEO BRONCHOSCOPY WITH ENDOBRONCHIAL ULTRASOUND;  Surgeon: Vida Rigger, MD;  Location: [facility];  Service: Thoracic;  Laterality: N/A;    Family History  Problem Relation Age of Onset   Ovarian cancer Mother     Heart attack Father    Emphysema Sister    Obesity Paternal Uncle            Social History:  reports that he quit smoking about 8  years ago. His smoking use included pipe and cigarettes. He started smoking about 67 years ago. He has never used smokeless tobacco. He reports that he does not currently use alcohol. He reports that he does not currently use drugs.  Allergies  Allergen Reactions   Ativan [Lorazepam]    Benadryl [Diphenhydramine]     Withdraw symptoms per patient   Crestor [Rosuvastatin Calcium]    Nsaids    Rapaflo [Silodosin]    Statins Other (See Comments)   Zetia [Ezetimibe]    Latex Rash    Bandaids only   Neosporin [Neomycin-Bacitracin Zn-Polymyx] Rash    MEDICATIONS:                                                                                                                     Scheduled:  afatinib dimaleate  30 mg Oral Daily   feeding supplement  237 mL Oral BID BM   pantoprazole (PROTONIX) IV  40 mg Intravenous Daily   traMADol  50 mg Oral QID   Continuous:  sodium chloride 75 mL/hr at 10/30/22 1130   ceFEPime (MAXIPIME) IV Stopped (10/30/22 0954)     ROS:                                                                                                                                       Unable to obtain due to AMS.    Blood pressure 113/63, pulse 68, temperature 98.2 F (36.8 C), resp. rate 18, height 6' (1.829 m), weight 78.3 kg, SpO2 98%.   General Examination:                                                                                                       Physical Exam HEENT- /AT. Oral mucosa are very dry due to persistent mouth breathing. Mouth is held widely open throughout the exam.  Lungs- Tachypneic with labored respirations and paradoxical breathing resulting in protrusion of abdomen with each breath.  Extremities- Findings to right shoulder  and proximal RUE consistent with prior muscle removal for resection of fibrosarcoma  in the distant past; distal RUE with preserved muscle mass. BLE and LUE are warm and well-perfused  Neurological Examination Mental Status: Lethargic. Will briefly open eyes to voice and tactile stimulation. Follows only some simple motor commands such as tongue protrusion and limb movement but only if repeated several times with coaching assistance from wife. Speech is severely dysarthric with one-word answers to questions only. Has difficulty giving me his name. Not oriented to time or place.  Cranial Nerves: II: Blinks to threat fairly consistently to threat on the left, inconsistently with threat to right hemifields. PERRL. . III,IV, VI: Eyes are conjugate. Briefly tracked examiner from left to right and back with extensive coaching and encouragement. Eyes are closed for almost all of the exam.  V: Corneals intact bilaterally VII: Lower and upper quadrants of face are symmetric but with no facial expressions.  VIII: Hearing intact to voice IX,X: Severely hypophonic speech XI: Head is midline XII: Weak midline tongue extension Motor: RUE: Weak grip and antigravity movement of lower arm. Unable to elevate at shoulder.  LUE: 3/5 grip, biceps and triceps to resistance. Deltoid 2/5 RLE: Unable to flex antigravity at hip but can pull leg backwards with hip and knee flexion to noxious combined with verbal request. Resists 4-/5 with knee extension.  LLE: Unable to flex antigravity at hip but can pull leg backwards with hip and knee flexion to noxious combined with verbal request. Resists 4-/5 with knee extension.  Sensory: Reacts to touch x 4. Deep Tendon Reflexes: 1+ and symmetric bilateral brachioradialis and patellae Plantars: Equivocal bilaterally Cerebellar: Unable to assess due to weakness and AMS.  Gait: Unable to assess due to profound weakness, fatigue and AMS.    Lab Results: Basic Metabolic Panel: Recent Labs  Lab 11/08/2022 1329 11-24-22 0355 10/30/22 0844  NA 133* 135 139  K  3.7 3.7 3.9  CL 99 102 109  CO2 21* 23 21*  GLUCOSE 212* 119* 122*  BUN 17 14 24*  CREATININE 0.93 0.57* 0.56*  CALCIUM 9.1 8.8* 9.0    CBC: Recent Labs  Lab 11/14/2022 1329 10/27/22 0622 10/28/22 0636  WBC 12.1* 20.1* 13.9*  NEUTROABS 10.6*  --   --   HGB 8.9* 10.5* 8.5*  HCT 27.5* 32.4* 26.1*  MCV 89.0 88.3 88.2  PLT 261 373 315    Cardiac Enzymes: No results for input(s): "CKTOTAL", "CKMB", "CKMBINDEX", "TROPONINI" in the last 168 hours.  Lipid Panel: No results for input(s): "CHOL", "TRIG", "HDL", "CHOLHDL", "VLDL", "LDLCALC" in the last 168 hours.  Imaging: MR BRAIN WO CONTRAST  Result Date: 10/30/2022 CLINICAL DATA:  Mental status change, unknown cause EXAM: MRI HEAD WITHOUT CONTRAST TECHNIQUE: Multiplanar, multiecho pulse sequences of the brain and surrounding structures were obtained without intravenous contrast. COMPARISON:  Brain MR 08/16/22 FINDINGS: Brain: There are innumerable predominantly cortical in the bilateral frontal, parietal, occipital lobes. Acute infarcts are also present in the right cerebellar hemisphere. Some infarcts are seen in a watershed distribution (series 5, image 96). No hemorrhage. No hydrocephalus. No extra-axial fluid collection. Sequela of mild overall microvascular ischemic change. Generalized volume loss. Assessment for the presence of intracranial metastatic disease is limited in the absence of IV contrast. Previously seen intracranial metastases in the left frontal and right occipital lobe are not definitively visualized this exam. Vascular: Normal flow voids. Skull and upper cervical spine: Normal marrow signal. Sinuses/Orbits: Small left mastoid effusion. No middle ear effusion. Paranasal  sinuses are clear. Bilateral lens replacement. Orbits are otherwise unremarkable. Other: None. IMPRESSION: 1. Innumerable predominantly cortical infarcts in the bilateral frontal, parietal, occipital lobes and right cerebellar hemisphere. Some acute infarcts  are also present in the watershed distribution bilaterally. Findings are concerning for a central embolic source. 2. Assessment for the presence of intracranial metastatic disease is limited in the absence of IV contrast. Previously seen intracranial metastases in the left frontal and right occipital lobe are not definitively visualized this exam. These results will be called to the ordering clinician or representative by the Radiologist Assistant, and communication documented in the PACS or [employer]. Electronically Signed   By: Lorenza Cambridge M.D.   On: 10/30/2022 10:40    Assessment: 82 year old male with lung cancer metastatic to brain who was admitted on 9/3 with hypoxic respiratory failure secondary to bilateral PE and pneumonia. Neurology consulted for further assessment after acute multifocal small strokes were seen on MRI.  - Exam reveals a severely debilitated and lethargic elderly male with mouth breathing, tachypnea, labored respirations, dry oral mucosa, paradoxical breathing pattern and diffuse limb weakness. He has almost no speech output, consisting of a few hypophonic and dysarthric one word answers to questions. Can follow simple motor commands. Recognizes his wife. Does not appear to be oriented to situation, time or place.  - MRI brain: Innumerable predominantly cortical infarcts in the bilateral frontal, parietal, occipital lobes and right cerebellar hemisphere. Some acute infarcts are also present in the watershed distribution bilaterally. Findings are concerning for a central embolic source. Assessment for the presence of intracranial metastatic disease is limited in the absence of IV contrast. Previously seen intracranial metastases in the left frontal and right occipital lobe are not definitively visualized this exam. - TTE: Left ventricular ejection fraction, by estimation, is 60 to 65%. The left ventricle has normal function. The left ventricle has no regional  wall motion  abnormalities. Left ventricular diastolic parameters are consistent with Grade I diastolic dysfunction (impaired relaxation). Right ventricular systolic function is normal. The right ventricular  size is normal. The mitral valve is normal in structure. Trivial mitral valve regurgitation. The aortic valve is calcified. Aortic valve regurgitation is mild. Aortic valve sclerosis/calcification is present, without any evidence of  aortic stenosis.  - Overall impression: Multiple bilateral acute cardioembolic strokes with additional watershed components in the ACA/MCA distributions bilaterally. Cardioembolic strokes are most likely secondary to hypercoagulability. No evidence for right to left shunt based on color doppler findings on TTE, but no bubble study performed.   Recommendations: - Can obtain CTA of head and neck, but doubt that it will change management.  - Cardiac telemetry - Neurological condition with diffuse weakness and encephalopathy is felt to be mostly secondary to a hospital delirium and severe fatigue in the context of his severely debilitated state and respiratory insufficiency. The total burden of the multifocal small acute ischemic strokes is not felt to be high enough to result in the degree of neurological compromise seen on exam.  - Continue IV heparin. Risk of hemorrhagic transformation of the strokes is felt to be relatively low relative to the risk of recurrent cardioembolic strokes.  - SBP goal should be 130-150 if possible - May benefit from palliative care consult - His condition was discussed at length and MRI images reviewed with his wife at the bedside. Overall prognosis is felt to be poor. All questions answered.    Electronically signed: Dr. Caryl Pina 10/30/2022, 11:27 AM

## 2022-10-30 NOTE — Progress Notes (Signed)
SLP Cancellation Note  Patient Details Name: Wesley Young MRN: [id number] DOB: 1940/08/15   Cancelled treatment:       Reason Eval/Treat Not Completed: Patient at procedure or test/unavailable (Pt OTF for MRI. Will continue efforts as appropriate.)  Clyde Canterbury, M.S., CCC-SLP Speech-Language Pathologist [facility] [phone] (ASCOM)  Woodroe Chen 10/30/2022, 10:13 AM

## 2022-10-30 NOTE — Progress Notes (Signed)
[facility]  - [place] [facility]   PATIENT NAME: Wesley Young    MR#:  [id number]  DATE OF BIRTH:  09-26-1940  SUBJECTIVE:  Wife  at bedside. Patient still remains sleepy. Opens eyes to verbal commands. Appears confused and unable to produce words. No fever no vomiting. Per RN patient use CPAP through the night and slept. Had hard time getting oral meds in last night. Wife was able to get some ice cream yesterday. VITALS:  Blood pressure 113/63, pulse 68, temperature 98.2 F (36.8 C), resp. rate 18, height 6' (1.829 m), weight 78.3 kg, SpO2 98%.  PHYSICAL EXAMINATION:  appears weak GENERAL:  82 y.o.-year-old patient with mild acute distress. Confused LUNGS: decreased breath sounds bilaterally. Breathing through the mouth. CARDIOVASCULAR: S1, S2 normal. No murmur, mild tachycardia ABDOMEN: Soft, nontender, nondistended.  EXTREMITIES: No  edema b/l.    NEUROLOGIC: will to assess completely. Intermittently moves extremities. Patient has dysarthria and lethargic.  Pressure Injury 10/30/22 Sacrum Right;Left;Medial Stage 1 -  Intact skin with non-blanchable redness of a localized area usually over a bony prominence. (Active)  10/30/22 0900  Location: Sacrum  Location Orientation: Right;Left;Medial  Staging: Stage 1 -  Intact skin with non-blanchable redness of a localized area usually over a bony prominence.  Wound Description (Comments):   Present on Admission:       LABORATORY PANEL:  CBC Recent Labs  Lab 10/28/22 0636  WBC 13.9*  HGB 8.5*  HCT 26.1*  PLT 315    Chemistries  Recent Labs  Lab 17-Nov-2022 1329 11/07/2022 0355 10/30/22 0844  NA 133*   < > 139  K 3.7   < > 3.9  CL 99   < > 109  CO2 21*   < > 21*  GLUCOSE 212*   < > 122*  BUN 17   < > 24*  CREATININE 0.93   < > 0.56*  CALCIUM 9.1   < > 9.0  AST 33  --   --   ALT 37  --   --   ALKPHOS 83  --   --   BILITOT 0.7  --   --    < > = values in this interval not displayed.   Cardiac  Enzymes No results for input(s): "TROPONINI" in the last 168 hours. RADIOLOGY:  MR BRAIN WO CONTRAST  Result Date: 10/30/2022 CLINICAL DATA:  Mental status change, unknown cause EXAM: MRI HEAD WITHOUT CONTRAST TECHNIQUE: Multiplanar, multiecho pulse sequences of the brain and surrounding structures were obtained without intravenous contrast. COMPARISON:  Brain MR 08/16/22 FINDINGS: Brain: There are innumerable predominantly cortical in the bilateral frontal, parietal, occipital lobes. Acute infarcts are also present in the right cerebellar hemisphere. Some infarcts are seen in a watershed distribution (series 5, image 96). No hemorrhage. No hydrocephalus. No extra-axial fluid collection. Sequela of mild overall microvascular ischemic change. Generalized volume loss. Assessment for the presence of intracranial metastatic disease is limited in the absence of IV contrast. Previously seen intracranial metastases in the left frontal and right occipital lobe are not definitively visualized this exam. Vascular: Normal flow voids. Skull and upper cervical spine: Normal marrow signal. Sinuses/Orbits: Small left mastoid effusion. No middle ear effusion. Paranasal sinuses are clear. Bilateral lens replacement. Orbits are otherwise unremarkable. Other: None. IMPRESSION: 1. Innumerable predominantly cortical infarcts in the bilateral frontal, parietal, occipital lobes and right cerebellar hemisphere. Some acute infarcts are also present in the watershed distribution bilaterally. Findings are concerning for a central embolic source.  2. Assessment for the presence of intracranial metastatic disease is limited in the absence of IV contrast. Previously seen intracranial metastases in the left frontal and right occipital lobe are not definitively visualized this exam. These results will be called to the ordering clinician or representative by the Radiologist Assistant, and communication documented in the PACS or [employer].  Electronically Signed   By: Lorenza Cambridge M.D.   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 Some acute infarcts are also present in the watershed distribution bilaterally. Findings are concerning for a central embolic source. --NPO --neurology consultation with Dr. Otelia Limes case discussed. -- Speech therapy to see patient -- hold PO meds. Change eliquis to IV heparin drip. Neurology is okay with it. -- Follow further recommendations per neuro --IV fluids  History of PAF, PAC, PVC, dysrhythmia noted in cardiology Dr. Darrold Junker note 06/2022 -- patient has had Holter monitor and 2022 did not show evidence of a fib. -- Not on any oral anticoagulation at home. -- EKG on admission showed sinus rhythm with STT changes -- remains on telemetry heartrate 70s to 90s -- consider cardiology consultation   Bilateral pulmonary embolism (HCC) Right LE DVT (posterior Tibial vein) --Heparin gtt --Echo showed EF 60-65% , mild LVH (grade 1 DD) --Vascular consultation with Dr. Wyn Quaker. Patient is status post Mechanical thrombectomy using the penumbra CAT 8 device to the right lower lobe, middle lobe, and upper lobe pulmonary arteries in the left lower lobe pulmonary artery  -- try to wean oxygen down as able to. Will continue heparin drip for today and transition to oral eliquis tomorrow -- vascular okay to transition to eliquis. --9/8--change to IV heparin gtt  Acute hypoxic respiratory failure secondary to Right sided pneumonia and bilateral pulmonary embolism -- white count elevated, chest x-ray shows  left sided infiltrate -- continue IV cefepime -- white count trending down. Continue incentive spirometer. -- Blood culture negative WBC trending down. No fever.  --  Metastatic squamous cell carcinoma lung (HCC) --Outpatient follow-up with hematology/oncology-- Dr. Cathie Hoops --Home afatinib ok to resume per oncology Dr Bethanne Ginger recommendation (secure chat) --pt has h/o brain radiation   Essential hypertension --Losartan  on hold --bp stable -- avoid hypotension  Chronic pain --Tramadol prn    OSA on CPAP --CPAP nightly ordered  H/o GERD, Esophageal varices --PPI IV daily  Generalized weakness with debility -- PT OT input appreciated.  Nutrition Status: Nutrition Problem: Moderate Malnutrition Etiology: chronic illness (cancer and cancer related treatments) Signs/Symptoms: mild fat depletion, mild muscle depletion Interventions: Ensure Enlive (each supplement provides 350kcal and 20 grams of protein), MVI, Liberalize Diet Follow RD recs  Overall long term poor prognosis. Will benefit for Palliative care to discuss GOC  Procedures:Mechanical thrombectomy for PE Family communication : wife at bedside Consults : vascular, Nephorlogy CODE STATUS: full DVT Prophylaxis : eliquis Level of care: Progressive Status is: Inpatient Remains inpatient appropriate because: current management for bilateral PE, pneumonia, DVT, CVA   TOTAL TIME TAKING CARE OF THIS PATIENT: 35 minutes.  >50% time spent on counselling and coordination of care  Note: This dictation was prepared with [employer]  dictation along with smaller phrase technology. Any transcriptional errors that result from this process are unintentional.  Enedina Finner M.D    [facility]   CC: Primary care physician; Lauro Regulus, MD

## 2022-10-30 NOTE — Consult Note (Addendum)
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  Allergies  Allergen Reactions   Ativan [Lorazepam]    Benadryl [Diphenhydramine]     Withdraw symptoms per patient   Crestor [Rosuvastatin Calcium]    Nsaids    Rapaflo [Silodosin]    Statins Other (See Comments)   Zetia [Ezetimibe]    Latex Rash    Bandaids only   Neosporin [Neomycin-Bacitracin Zn-Polymyx] Rash    Patient Measurements: Height: 6' (182.9 cm) Weight: 78.3 kg (172 lb 9.9 oz) IBW/kg (Calculated) : 77.6 Heparin Dosing Weight: 78.3  Vital Signs: Temp: 98.2 F (36.8 C) (09/08 0903) Temp Source: Oral (09/08 0343) BP: 113/63 (09/08 0903) Pulse Rate: 68 (09/08 0903)  Labs: Recent Labs    10/27/22 2024 10/28/22 0636 10/30/22 0844  HGB  --  8.5*  --   HCT  --  26.1*  --   PLT  --  315  --   HEPARINUNFRC >1.10* 0.16*  --   CREATININE  --   --  0.56*    Estimated Creatinine Clearance: 75.4 mL/min (A) (by C-G formula based on SCr of 0.56 mg/dL (L)).   Medical History: Past Medical History:  Diagnosis Date   Abnormal PSA    s/p post prostate biopsy in the past which was negative   Actinic keratosis 03/14/2007   L ant lat neck at base of neck - bx proven    Actinic keratosis 01/08/2014   R calf - bx proven    Alcoholism (HCC)    with prolonged hospitalization 2009 for withdrawal c/b aspiration pneumonia requiring vent   Anxiety    Atrial fibrillation and flutter (HCC)    pt denies afib.   B12 deficiency    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 ECHO 60-65%, s/p thrombectomy to the R lower lobe, middle lobe and upper lobe. 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   Patient was previously on heparin this admission, last rate was 1850 units/hr  Goal of Therapy:  Heparin level 0.3-0.5 units/ml aPTT 66-85 seconds Monitor platelets by anticoagulation protocol: Yes   Plan:  Start heparin infusion at 950 units/hr, no bolus protocol Monitoring: Baseline aPTT and CBC ordered  Daily CBC  Daily aPTT and HL Check aPTT & HL 8 hours after first dose  Effie Shy PGY1 Pharmacy Resident 10/30/2022,12:02 PM

## 2022-10-30 NOTE — Consult Note (Signed)
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  Allergies  Allergen Reactions   Ativan [Lorazepam]    Benadryl [Diphenhydramine]     Withdraw symptoms per patient   Crestor [Rosuvastatin Calcium]    Nsaids    Rapaflo [Silodosin]    Statins Other (See Comments)   Zetia [Ezetimibe]    Latex Rash    Bandaids only   Neosporin [Neomycin-Bacitracin Zn-Polymyx] Rash    Patient Measurements: Height: 6' (182.9 cm) Weight: 78.3 kg (172 lb 9.9 oz) IBW/kg (Calculated) : 77.6 Heparin Dosing Weight: 78.3  Vital Signs: Temp: 99 F (37.2 C) (09/08 2100) Temp Source: Axillary (09/08 2100) BP: 153/77 (09/08 2100) Pulse Rate: 111 (09/08 2100)  Labs: Recent Labs    10/28/22 0636 10/30/22 0844 10/30/22 1237 10/30/22 2151  HGB 8.5*  --  9.4*  --   HCT 26.1*  --  30.1*  --   PLT 315  --  363  --   APTT  --   --  37* 55*  HEPARINUNFRC 0.16*  --  >1.10* >1.10*  CREATININE  --  0.56*  --   --     Estimated Creatinine Clearance: 75.4 mL/min (A) (by C-G formula based on SCr of 0.56 mg/dL (L)).   Medical History: Past Medical History:  Diagnosis Date   Abnormal PSA    s/p post prostate biopsy in the past which was negative   Actinic keratosis 03/14/2007   L ant lat neck at base of neck - bx proven    Actinic keratosis 01/08/2014   R calf - bx proven    Alcoholism (HCC)    with prolonged hospitalization 2009 for withdrawal c/b aspiration pneumonia requiring vent   Anxiety    Atrial fibrillation and flutter (HCC)    pt denies afib.   B12 deficiency    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 Left ventricular ejection  fraction app 45 %   CHF (congestive heart failure) (HCC)    mild left ventricular systolic dysfunction   Dental bridge present    "[name]" bridge, top - right   Duodenitis    Dysplastic nevus 01/08/2014   L prox ant thigh - mild    Dyspnea    on exertion   Dyspnea on exertion    Dysrhythmia    Erosive esophagitis    Esophageal varices (HCC)    Fibrosarcoma (HCC)    Gross hematuria    High cholesterol    Hx of melanoma in situ 01/20/2015   L upper back paraspinal - excision    Hx of squamous cell carcinoma of skin 2014   multiple sites   Hypertension    Impotence    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HCC)    Sleep apnea    Spinal stenosis    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   L forearm - excision    Squamous cell carcinoma of skin 12/16/2015   R dorsum hand    Squamous cell carcinoma of skin 10/02/2017   R dorsum hand    Varicose veins of both lower extremities     Medications:  Apixaban 10 mg BID - last dose 9/7@2310   Assessment: Wesley Young is a 82 yo male who presented to the ED with weakness, SpO2 70% on RA. ECHO 60-65%, s/p thrombectomy to the R lower lobe, middle lobe and upper lobe. 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   Patient was previously on heparin this admission, last rate was 1850 units/hr  HL: 9/8 2151- >1.1  aPTT: 9/8 2151- 55, subtherapeutic   Goal of Therapy:  Heparin level 0.3-0.5 units/ml aPTT 66-85 seconds Monitor platelets by anticoagulation protocol: Yes   Plan:  aPTT level subtherapeutic, will increase heparin rate by 2 units/kg/hr to 1100 units/hr Heparin level still elevated >1.1, will continue to monitor with aPTT Check aPTT and HL 8 hours after rate increase Continue to monitor daily CBC   Merryl Hacker, PharmD Clinical Pharmacist 10/30/2022,10:11 PM

## 2022-10-31 ENCOUNTER — Other Ambulatory Visit (HOSPITAL_COMMUNITY): Payer: Self-pay

## 2022-10-31 ENCOUNTER — Inpatient Hospital Stay: Payer: Medicare Other

## 2022-10-31 DIAGNOSIS — I2699 Other pulmonary embolism without acute cor pulmonale: Secondary | ICD-10-CM | POA: Diagnosis not present

## 2022-10-31 LAB — CBC
HCT: 26.3 % — ABNORMAL LOW (ref 39.0–52.0)
Hemoglobin: 8.1 g/dL — ABNORMAL LOW (ref 13.0–17.0)
MCH: 28.1 pg (ref 26.0–34.0)
MCHC: 30.8 g/dL (ref 30.0–36.0)
MCV: 91.3 fL (ref 80.0–100.0)
Platelets: 354 10*3/uL (ref 150–400)
RBC: 2.88 MIL/uL — ABNORMAL LOW (ref 4.22–5.81)
RDW: 14 % (ref 11.5–15.5)
WBC: 12.2 10*3/uL — ABNORMAL HIGH (ref 4.0–10.5)
nRBC: 0 % (ref 0.0–0.2)

## 2022-10-31 LAB — APTT
aPTT: 60 s — ABNORMAL HIGH (ref 24–36)
aPTT: 65 s — ABNORMAL HIGH (ref 24–36)

## 2022-10-31 LAB — HEPARIN LEVEL (UNFRACTIONATED): Heparin Unfractionated: >1.1 [IU]/mL — ABNORMAL HIGH (ref 0.30–0.70)

## 2022-10-31 LAB — GLUCOSE, CAPILLARY
Glucose-Capillary: 178 mg/dL — ABNORMAL HIGH (ref 70–99)
Glucose-Capillary: 157 mg/dL — ABNORMAL HIGH (ref 70–99)
Glucose-Capillary: 167 mg/dL — ABNORMAL HIGH (ref 70–99)
Glucose-Capillary: 162 mg/dL — ABNORMAL HIGH (ref 70–99)

## 2022-10-31 NOTE — Significant Event (Signed)
Patient has red MEWS d/t increased RR and HR. Lethargic, responds to pain. Patient is NPO d/t not tolerating PO intake. No blood sugar orders or fluids with dextrose. Patient's BS dropped earlier this morning. Dr. Arville Care paged to get orders for dextrose fluids and CBG orders. Verbal orders given. Will monitor V/S according to Red MEWS guidelines.

## 2022-10-31 NOTE — Consult Note (Addendum)
ANTICOAGULATION CONSULT NOTE Pharmacy Consult for Heparin Indication: Pulmonary Embolism s/p thrombectomy with acute CVA  Allergies  Allergen Reactions   Ativan [Lorazepam]    Benadryl [Diphenhydramine]     Withdraw symptoms per patient   Crestor [Rosuvastatin Calcium]    Nsaids    Rapaflo [Silodosin]    Statins Other (See Comments)   Zetia [Ezetimibe]    Latex Rash    Bandaids only   Neosporin [Neomycin-Bacitracin Zn-Polymyx] Rash    Patient Measurements: Height: 6' (182.9 cm) Weight: 78.3 kg (172 lb 9.9 oz) IBW/kg (Calculated) : 77.6 Heparin Dosing Weight: 78.3  Vital Signs: Temp: 98.2 F (36.8 C) (09/09 1609) Temp Source: Oral (09/09 1609) BP: 108/64 (09/09 1700) Pulse Rate: 90 (09/09 1700)  Labs: Recent Labs    10/30/22 0844 10/30/22 1237 10/30/22 1237 10/30/22 2151 10/31/22 0445 10/31/22 0616 10/31/22 1628  HGB  --  9.4*  --   --  8.1*  --   --   HCT  --  30.1*  --   --  26.3*  --   --   PLT  --  363  --   --  354  --   --   APTT  --  37*   < > 55*  --  60* 65*  HEPARINUNFRC  --  >1.10*  --  >1.10*  --  >1.10*  --   CREATININE 0.56*  --   --   --   --   --   --    < > = values in this interval not displayed.    Estimated Creatinine Clearance: 75.4 mL/min (A) (by C-G formula based on SCr of 0.56 mg/dL (L)).   Medical History: Past Medical History:  Diagnosis Date   Abnormal PSA    s/p post prostate biopsy in the past which was negative   Actinic keratosis 03/14/2007   L ant lat neck at base of neck - bx proven    Actinic keratosis 01/08/2014   R calf - bx proven    Alcoholism (HCC)    with prolonged hospitalization 2009 for withdrawal c/b aspiration pneumonia requiring vent   Anxiety    Atrial fibrillation and flutter (HCC)    pt denies afib.   B12 deficiency    Basal cell carcinoma 03/14/2007   R distal lat tricep near elbow    Basal cell carcinoma 03/06/2014   R calf - excision 04/22/2014   Basal cell carcinoma 08/30/2021   suprasternal  area, treated with EDC   Cardiomyopathy (HCC)    mild probably multifactorial secondary to HTn and possible alcohol contribution. 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 ECHO 60-65%, s/p thrombectomy to the R lower lobe, middle lobe and upper lobe. 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   Patient was previously on heparin this admission, last rate was 1850 units/hr  Date/Time aPTT HL  Comments 9/8@2151  55 >1.10  Subtherapeutic @ 950un -->1100un 9/9@0616  60 >1.10  Subtherapeutic @ 1100un --> 1250 un 9/9@1628  65   Subtherapeutic @ 1250un --> 1300 un  Goal of Therapy:  Heparin level 0.3-0.5 units/ml aPTT 66-85 seconds Monitor platelets by anticoagulation protocol: Yes  Hgb 8.1, plt WNL. No issues with infusion reported, no signs/symptoms of bleeding noted.   Plan:  aPTT remains sub-therapeutic. NO  BOLUS per stroke protocol, increase heparin infusion to 1300 un/hr. HL and aPTT not correlating, will continue to monitor aPTT for now. Check next aPTT 8 hrs after rate change. Check aPTT and HL 8 hours after rate increase. Continue to monitor daily CBC.  Thank you for involving pharmacy in this patient's care.   Rockwell Alexandria, PharmD Clinical Pharmacist 10/31/2022 6:50 PM

## 2022-10-31 NOTE — Plan of Care (Signed)

## 2022-10-31 NOTE — Progress Notes (Signed)
Prior-To-Admission Oral Chemotherapy for Treatment of Oncologic Disease   Order noted from Dr. Allena Katz to continue prior-to-admission oral chemotherapy regimen of Gilotrif.  Procedure Per Pharmacy & Therapeutics Committee Policy: Orders for continuation of home oral chemotherapy for treatment of an oncologic disease will be held unless approved by an oncologist during current admission.    For patients receiving oncology care at [facility], inpatient pharmacist contacts patient's oncologist during regular office hours to review. If earlier review is medically necessary, attending physician consults [facility] on-call oncologist   For patients receiving oncology care outside of [facility], attending physician consults patient's oncologist to review. If this oncologist or their coverage cannot be reached, attending physician consults [facility] on-call oncologist   Oral chemotherapy continuation order is on hold pending oncologist review, [facility] oncologist Dr. Cathie Hoops will be notified by inpatient pharmacy during office hours   Drusilla Kanner, PharmD, MBA

## 2022-10-31 NOTE — Consult Note (Signed)
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  Allergies  Allergen Reactions   Ativan [Lorazepam]    Benadryl [Diphenhydramine]     Withdraw symptoms per patient   Crestor [Rosuvastatin Calcium]    Nsaids    Rapaflo [Silodosin]    Statins Other (See Comments)   Zetia [Ezetimibe]    Latex Rash    Bandaids only   Neosporin [Neomycin-Bacitracin Zn-Polymyx] Rash    Patient Measurements: Height: 6' (182.9 cm) Weight: 78.3 kg (172 lb 9.9 oz) IBW/kg (Calculated) : 77.6 Heparin Dosing Weight: 78.3  Vital Signs: Temp: 98 F (36.7 C) (09/09 0645) Temp Source: Axillary (09/09 0645) BP: 103/73 (09/09 0700) Pulse Rate: 74 (09/09 0700)  Labs: Recent Labs    10/30/22 0844 10/30/22 1237 10/30/22 2151 10/31/22 0445 10/31/22 0616  HGB  --  9.4*  --  8.1*  --   HCT  --  30.1*  --  26.3*  --   PLT  --  363  --  354  --   APTT  --  37* 55*  --  60*  HEPARINUNFRC  --  >1.10* >1.10*  --  >1.10*  CREATININE 0.56*  --   --   --   --     Estimated Creatinine Clearance: 75.4 mL/min (A) (by C-G formula based on SCr of 0.56 mg/dL (L)).   Medical History: Past Medical History:  Diagnosis Date   Abnormal PSA    s/p post prostate biopsy in the past which was negative   Actinic keratosis 03/14/2007   L ant lat neck at base of neck - bx proven    Actinic keratosis 01/08/2014   R calf - bx proven    Alcoholism (HCC)    with prolonged hospitalization 2009 for withdrawal c/b aspiration pneumonia requiring vent   Anxiety    Atrial fibrillation and flutter (HCC)    pt denies afib.   B12 deficiency    Basal cell carcinoma 03/14/2007   R distal lat tricep near elbow    Basal cell carcinoma 03/06/2014   R calf - excision 04/22/2014   Basal cell carcinoma 08/30/2021   suprasternal area, treated with EDC   Cardiomyopathy (HCC)    mild probably multifactorial secondary to HTn and possible  alcohol contribution. 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 ECHO 60-65%, s/p thrombectomy to the R lower lobe, middle lobe and upper lobe. 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   Patient was previously on heparin this admission, last rate was 1850 units/hr  Date/Time aPTT HL  Comments 9/8@2151  55 >1.10  Subtherapeutic @ 950un -->1100un 9/9@0616  60 >1.10  Subtherapeutic @ 1100un --> 1250 un   Goal of Therapy:  Heparin level 0.3-0.5 units/ml aPTT 66-85 seconds Monitor platelets by anticoagulation protocol: Yes   Plan:  aPTT remains sub-therapeutic. NO BOLUS per stroke protocol, increase heparin infusion to 1250 un/hr. HL and aPTT no correlating, will continue to monitor aPTT for now. Check next aPTT 8 hrs after rate change. Check aPTT and HL 8 hours after rate increase. Continue to monitor daily  CBC.  Ted Goodner Rodriguez-Guzman PharmD, BCPS 10/31/2022 7:54 AM

## 2022-10-31 NOTE — Consult Note (Signed)
25000 units/268mL), 1,300 Units/hr, Intravenous, Continuous, Rockwell Alexandria, RPH, Last Rate: 12.5 mL/hr at 10/31/22 1236, 1,250 Units/hr at 10/31/22 1236   pantoprazole (PROTONIX) injection 40 mg, 40 mg, Intravenous, Daily, Enedina Finner, MD, 40 mg at 10/31/22 0827   senna-docusate (Senokot-S) tablet 1 tablet, 1 tablet, Oral, QHS PRN, Wyn Quaker, Marlow Baars, MD   trolamine salicylate (ASPERCREME) 10 % cream, , Topical, PRN, Enedina Finner, MD, Given at 10/28/22 2217  Facility-Administered Medications Ordered in Other Encounters:    sodium chloride flush (NS) 0.9 % injection 10 mL, 10 mL, Intravenous, Once, Rickard Patience, MD  Review of Systems  Unable to perform ROS: Mental status change    PHYSICAL EXAM Vitals:   10/31/22 1416 10/31/22 1505 10/31/22 1609 10/31/22 1700  BP:   112/71 108/64  Pulse: 93   90  Resp: (!) 31   (!) 29  Temp:   98.2 F (36.8 C)   TempSrc:   Oral   SpO2: 96% 94%  96%  Weight:      Height:       Physical Exam Constitutional:      Comments: Lethargic  HENT:     Head: Normocephalic and atraumatic.  Eyes:     General: No scleral icterus. Cardiovascular:     Rate and Rhythm: Normal rate and regular rhythm.  Pulmonary:     Comments: On nasal cannula oxygen.   Decreased breath sounds bilaterally. Breathing through the mouth. Abdominal:     General: There is no distension.     Palpations: Abdomen is soft.  Musculoskeletal:        General: No deformity.     Cervical back: Normal range of motion and neck supple.  Skin:    Findings: No erythema.  Neurological:     Motor: No abnormal muscle tone.     Comments: Patient remains  lethargic during my conversation with family members.  Not able to answer any questions.  Not able to access orientation  Psychiatric:        Mood and Affect: Affect normal.       LABORATORY STUDIES    Latest Ref Rng & Units 10/31/2022    4:45 AM 10/30/2022   12:37 PM 10/28/2022    6:36 AM  CBC  WBC 4.0 - 10.5 K/uL 12.2  14.4  13.9   Hemoglobin 13.0 - 17.0 g/dL 8.1  9.4  8.5   Hematocrit 39.0 - 52.0 % 26.3  30.1  26.1   Platelets 150 - 400 K/uL 354  363  315       Latest Ref Rng & Units 10/30/2022    8:44 AM 11/01/2022    3:55 AM 11/12/2022    1:29 PM  CMP  Glucose 70 - 99 mg/dL 454  098  119   BUN 8 - 23 mg/dL 24  14  17    Creatinine 0.61 - 1.24 mg/dL 1.47  8.29  5.62   Sodium 135 - 145 mmol/L 139  135  133   Potassium 3.5 - 5.1 mmol/L 3.9  3.7  3.7   Chloride 98 - 111 mmol/L 109  102  99   CO2 22 - 32 mmol/L 21  23  21    Calcium 8.9 - 10.3 mg/dL 9.0  8.8  9.1   Total Protein 6.5 - 8.1 g/dL   5.9   Total Bilirubin 0.3 - 1.2 mg/dL   0.7   Alkaline Phos 38 - 126 U/L   83   AST 15 - 41 U/L  on top of head   PORTA CATH INSERTION N/A 08/18/2022   Procedure: PORTA CATH INSERTION;  Surgeon: Annice Needy, MD;  Location: [facility];  Service: Cardiovascular;  Laterality: N/A;   prostat biopsy x2     PULMONARY THROMBECTOMY Bilateral 10/24/2022   Procedure: PULMONARY THROMBECTOMY;  Surgeon: Annice Needy, MD;  Location: [facility];  Service: Cardiovascular;  Laterality: Bilateral;   right deltoid resection in 1977     s/p resection of his right deltoid muscle in 1977     TONSILLECTOMY     TONSILLECTOMY AND ADENOIDECTOMY     VIDEO BRONCHOSCOPY WITH ENDOBRONCHIAL ULTRASOUND N/A 08/05/2022   Procedure: VIDEO BRONCHOSCOPY WITH ENDOBRONCHIAL ULTRASOUND;  Surgeon: Vida Rigger, MD;  Location: [facility];  Service: Thoracic;  Laterality: N/A;    Social History    Socioeconomic History   Marital status: Married    Spouse name: Not on file   Number of children: Not on file   Years of education: Not on file   Highest education level: Not on file  Occupational History   Not on file  Tobacco Use   Smoking status: Former    Current packs/day: 0.00    Types: Pipe, Cigarettes    Start date: 26    Quit date: 1977    Years since quitting: 47.7   Smokeless tobacco: Never  Vaping Use   Vaping status: Never Used  Substance and Sexual Activity   Alcohol use: Not Currently    Comment: quit 03/26/2008   Drug use: Not Currently   Sexual activity: Not Currently  Other Topics Concern   Not on file  Social History Narrative   Not on file   Social Determinants of Health   Financial Resource Strain: Low Risk  (08/10/2022)   Received from [facility], [facility]   Overall Financial Resource Strain (CARDIA)    Difficulty of Paying Living Expenses: Not hard at all  Food Insecurity: No Food Insecurity (10/24/2022)   Hunger Vital Sign    Worried About Running Out of Food in the Last Year: Never true    Ran Out of Food in the Last Year: Never true  Transportation Needs: No Transportation Needs (11/01/2022)   PRAPARE - Administrator, Civil Service (Medical): No    Lack of Transportation (Non-Medical): No  Physical Activity: Not on file  Stress: Not on file  Social Connections: Not on file  Intimate Partner Violence: Not At Risk (11/13/2022)   Humiliation, Afraid, Rape, and Kick questionnaire    Fear of Current or Ex-Partner: No    Emotionally Abused: No    Physically Abused: No    Sexually Abused: No     Family History  Problem Relation Age of Onset   Ovarian cancer Mother    Heart attack Father    Emphysema Sister    Obesity Paternal Uncle      Current Facility-Administered Medications:    benzonatate (TESSALON) capsule 200 mg, 200 mg, Oral, TID PRN, Annice Needy, MD, 200 mg at 10/28/22 2218    ceFEPIme (MAXIPIME) 2 g in sodium chloride 0.9 % 100 mL IVPB, 2 g, Intravenous, Q8H, Patel, Sona, MD, Last Rate: 200 mL/hr at 10/31/22 1730, 2 g at 10/31/22 1730   dextrose 5 % and 0.45 % NaCl infusion, , Intravenous, Continuous, Mansy, Jan A, MD, Last Rate: 100 mL/hr at 10/31/22 1233, New Bag at 10/31/22 1233   heparin ADULT infusion 100 units/mL (  25000 units/268mL), 1,300 Units/hr, Intravenous, Continuous, Rockwell Alexandria, RPH, Last Rate: 12.5 mL/hr at 10/31/22 1236, 1,250 Units/hr at 10/31/22 1236   pantoprazole (PROTONIX) injection 40 mg, 40 mg, Intravenous, Daily, Enedina Finner, MD, 40 mg at 10/31/22 0827   senna-docusate (Senokot-S) tablet 1 tablet, 1 tablet, Oral, QHS PRN, Wyn Quaker, Marlow Baars, MD   trolamine salicylate (ASPERCREME) 10 % cream, , Topical, PRN, Enedina Finner, MD, Given at 10/28/22 2217  Facility-Administered Medications Ordered in Other Encounters:    sodium chloride flush (NS) 0.9 % injection 10 mL, 10 mL, Intravenous, Once, Rickard Patience, MD  Review of Systems  Unable to perform ROS: Mental status change    PHYSICAL EXAM Vitals:   10/31/22 1416 10/31/22 1505 10/31/22 1609 10/31/22 1700  BP:   112/71 108/64  Pulse: 93   90  Resp: (!) 31   (!) 29  Temp:   98.2 F (36.8 C)   TempSrc:   Oral   SpO2: 96% 94%  96%  Weight:      Height:       Physical Exam Constitutional:      Comments: Lethargic  HENT:     Head: Normocephalic and atraumatic.  Eyes:     General: No scleral icterus. Cardiovascular:     Rate and Rhythm: Normal rate and regular rhythm.  Pulmonary:     Comments: On nasal cannula oxygen.   Decreased breath sounds bilaterally. Breathing through the mouth. Abdominal:     General: There is no distension.     Palpations: Abdomen is soft.  Musculoskeletal:        General: No deformity.     Cervical back: Normal range of motion and neck supple.  Skin:    Findings: No erythema.  Neurological:     Motor: No abnormal muscle tone.     Comments: Patient remains  lethargic during my conversation with family members.  Not able to answer any questions.  Not able to access orientation  Psychiatric:        Mood and Affect: Affect normal.       LABORATORY STUDIES    Latest Ref Rng & Units 10/31/2022    4:45 AM 10/30/2022   12:37 PM 10/28/2022    6:36 AM  CBC  WBC 4.0 - 10.5 K/uL 12.2  14.4  13.9   Hemoglobin 13.0 - 17.0 g/dL 8.1  9.4  8.5   Hematocrit 39.0 - 52.0 % 26.3  30.1  26.1   Platelets 150 - 400 K/uL 354  363  315       Latest Ref Rng & Units 10/30/2022    8:44 AM 11/01/2022    3:55 AM 11/12/2022    1:29 PM  CMP  Glucose 70 - 99 mg/dL 454  098  119   BUN 8 - 23 mg/dL 24  14  17    Creatinine 0.61 - 1.24 mg/dL 1.47  8.29  5.62   Sodium 135 - 145 mmol/L 139  135  133   Potassium 3.5 - 5.1 mmol/L 3.9  3.7  3.7   Chloride 98 - 111 mmol/L 109  102  99   CO2 22 - 32 mmol/L 21  23  21    Calcium 8.9 - 10.3 mg/dL 9.0  8.8  9.1   Total Protein 6.5 - 8.1 g/dL   5.9   Total Bilirubin 0.3 - 1.2 mg/dL   0.7   Alkaline Phos 38 - 126 U/L   83   AST 15 - 41 U/L  Gray-scale sonography with graded compression, as well as color Doppler and duplex ultrasound were performed to evaluate the lower extremity deep venous systems from the level of the common femoral vein and including the common femoral, femoral, profunda femoral, popliteal and calf veins including the posterior tibial, peroneal and gastrocnemius veins when visible. The superficial great saphenous vein was also interrogated. Spectral Doppler was utilized to evaluate flow at rest and with distal augmentation maneuvers in the common femoral, femoral and popliteal veins. COMPARISON:  None Available. FINDINGS: Contralateral Common Femoral Vein: Respiratory phasicity  is normal and symmetric with the symptomatic side. No evidence of thrombus. Normal compressibility. Common Femoral Vein: No evidence of thrombus. Normal compressibility, respiratory phasicity and response to augmentation. Saphenofemoral Junction: There is mixed echogenic occlusive thrombus involving the saphenofemoral junction (image 14). Profunda Femoral Vein: No evidence of thrombus. Normal compressibility and flow on color Doppler imaging. Femoral Vein: No evidence of thrombus. Normal compressibility, respiratory phasicity and response to augmentation. Popliteal Vein: No evidence of thrombus. Normal compressibility, respiratory phasicity and response to augmentation. Calf Veins: There is hypoechoic occlusive thrombus involving one of the paired divisions of the right posterior tibial vein (images 35 and 36). The paired peroneal veins appear patent where imaged. Superficial Great Saphenous Vein: There is mixed echogenic occlusive thrombus throughout the interrogated course of the greater saphenous vein (images 7 through 13). Patient's palpable lump within the proximal aspect of the right calf correlates with an occluded segment of the greater saphenous vein (image 12). Other Findings:  None. IMPRESSION: 1. The examination is positive for occlusive DVT involving one of the paired divisions of the right posterior tibial vein. There is no extension of this distal tibial DVT to the more proximal venous system of the right lower extremity. 2. Examination is positive for occlusive superficial thrombophlebitis involving the saphenofemoral junction (not extending to involve the common femoral vein) as well as the interrogated course of the greater saphenous vein. Electronically Signed   By: Simonne Come M.D.   On: 11/12/2022 15:50   DG Chest Portable 1 View  Result Date: 11/15/2022 CLINICAL DATA:  Sepsis. EXAM: PORTABLE CHEST 1 VIEW COMPARISON:  X-ray 08/05/2022 FINDINGS: Right lung is grossly clear. Hyperinflation.  Right IJ chest port with the tip along the central SVC above the right atrium. Small left effusion with increasing mid to lower lung opacities. No pneumothorax. Calcified aorta. Overlapping cardiac leads. Apical pleural thickening. Degenerative changes of the spine. Osteopenia IMPRESSION: Small left effusion with developing mid to lower lung opacity. Acute infiltrate is possible. Recommend follow-up. Chest port Electronically Signed   By: Karen Kays M.D.   On: 10/27/2022 15:01   ECHOCARDIOGRAM COMPLETE  Result Date: 09/21/2022    ECHOCARDIOGRAM REPORT   Patient Name:   Wesley Young Date of Exam: 09/21/2022 Medical Rec #:  [id number]      Height:       72.0 in Accession #:    [id number]     Weight:       171.2 lb Date of Birth:  1940/08/19      BSA:          1.994 m Patient Age:    82 years       BP:           136/67 mmHg Patient Gender: M              HR:           63 bpm. Exam Location:  [facility] Procedure:  Gray-scale sonography with graded compression, as well as color Doppler and duplex ultrasound were performed to evaluate the lower extremity deep venous systems from the level of the common femoral vein and including the common femoral, femoral, profunda femoral, popliteal and calf veins including the posterior tibial, peroneal and gastrocnemius veins when visible. The superficial great saphenous vein was also interrogated. Spectral Doppler was utilized to evaluate flow at rest and with distal augmentation maneuvers in the common femoral, femoral and popliteal veins. COMPARISON:  None Available. FINDINGS: Contralateral Common Femoral Vein: Respiratory phasicity  is normal and symmetric with the symptomatic side. No evidence of thrombus. Normal compressibility. Common Femoral Vein: No evidence of thrombus. Normal compressibility, respiratory phasicity and response to augmentation. Saphenofemoral Junction: There is mixed echogenic occlusive thrombus involving the saphenofemoral junction (image 14). Profunda Femoral Vein: No evidence of thrombus. Normal compressibility and flow on color Doppler imaging. Femoral Vein: No evidence of thrombus. Normal compressibility, respiratory phasicity and response to augmentation. Popliteal Vein: No evidence of thrombus. Normal compressibility, respiratory phasicity and response to augmentation. Calf Veins: There is hypoechoic occlusive thrombus involving one of the paired divisions of the right posterior tibial vein (images 35 and 36). The paired peroneal veins appear patent where imaged. Superficial Great Saphenous Vein: There is mixed echogenic occlusive thrombus throughout the interrogated course of the greater saphenous vein (images 7 through 13). Patient's palpable lump within the proximal aspect of the right calf correlates with an occluded segment of the greater saphenous vein (image 12). Other Findings:  None. IMPRESSION: 1. The examination is positive for occlusive DVT involving one of the paired divisions of the right posterior tibial vein. There is no extension of this distal tibial DVT to the more proximal venous system of the right lower extremity. 2. Examination is positive for occlusive superficial thrombophlebitis involving the saphenofemoral junction (not extending to involve the common femoral vein) as well as the interrogated course of the greater saphenous vein. Electronically Signed   By: Simonne Come M.D.   On: 11/12/2022 15:50   DG Chest Portable 1 View  Result Date: 11/15/2022 CLINICAL DATA:  Sepsis. EXAM: PORTABLE CHEST 1 VIEW COMPARISON:  X-ray 08/05/2022 FINDINGS: Right lung is grossly clear. Hyperinflation.  Right IJ chest port with the tip along the central SVC above the right atrium. Small left effusion with increasing mid to lower lung opacities. No pneumothorax. Calcified aorta. Overlapping cardiac leads. Apical pleural thickening. Degenerative changes of the spine. Osteopenia IMPRESSION: Small left effusion with developing mid to lower lung opacity. Acute infiltrate is possible. Recommend follow-up. Chest port Electronically Signed   By: Karen Kays M.D.   On: 10/27/2022 15:01   ECHOCARDIOGRAM COMPLETE  Result Date: 09/21/2022    ECHOCARDIOGRAM REPORT   Patient Name:   Wesley Young Date of Exam: 09/21/2022 Medical Rec #:  [id number]      Height:       72.0 in Accession #:    [id number]     Weight:       171.2 lb Date of Birth:  1940/08/19      BSA:          1.994 m Patient Age:    82 years       BP:           136/67 mmHg Patient Gender: M              HR:           63 bpm. Exam Location:  [facility] Procedure:  on top of head   PORTA CATH INSERTION N/A 08/18/2022   Procedure: PORTA CATH INSERTION;  Surgeon: Annice Needy, MD;  Location: [facility];  Service: Cardiovascular;  Laterality: N/A;   prostat biopsy x2     PULMONARY THROMBECTOMY Bilateral 10/24/2022   Procedure: PULMONARY THROMBECTOMY;  Surgeon: Annice Needy, MD;  Location: [facility];  Service: Cardiovascular;  Laterality: Bilateral;   right deltoid resection in 1977     s/p resection of his right deltoid muscle in 1977     TONSILLECTOMY     TONSILLECTOMY AND ADENOIDECTOMY     VIDEO BRONCHOSCOPY WITH ENDOBRONCHIAL ULTRASOUND N/A 08/05/2022   Procedure: VIDEO BRONCHOSCOPY WITH ENDOBRONCHIAL ULTRASOUND;  Surgeon: Vida Rigger, MD;  Location: [facility];  Service: Thoracic;  Laterality: N/A;    Social History    Socioeconomic History   Marital status: Married    Spouse name: Not on file   Number of children: Not on file   Years of education: Not on file   Highest education level: Not on file  Occupational History   Not on file  Tobacco Use   Smoking status: Former    Current packs/day: 0.00    Types: Pipe, Cigarettes    Start date: 26    Quit date: 1977    Years since quitting: 47.7   Smokeless tobacco: Never  Vaping Use   Vaping status: Never Used  Substance and Sexual Activity   Alcohol use: Not Currently    Comment: quit 03/26/2008   Drug use: Not Currently   Sexual activity: Not Currently  Other Topics Concern   Not on file  Social History Narrative   Not on file   Social Determinants of Health   Financial Resource Strain: Low Risk  (08/10/2022)   Received from [facility], [facility]   Overall Financial Resource Strain (CARDIA)    Difficulty of Paying Living Expenses: Not hard at all  Food Insecurity: No Food Insecurity (10/24/2022)   Hunger Vital Sign    Worried About Running Out of Food in the Last Year: Never true    Ran Out of Food in the Last Year: Never true  Transportation Needs: No Transportation Needs (11/01/2022)   PRAPARE - Administrator, Civil Service (Medical): No    Lack of Transportation (Non-Medical): No  Physical Activity: Not on file  Stress: Not on file  Social Connections: Not on file  Intimate Partner Violence: Not At Risk (11/13/2022)   Humiliation, Afraid, Rape, and Kick questionnaire    Fear of Current or Ex-Partner: No    Emotionally Abused: No    Physically Abused: No    Sexually Abused: No     Family History  Problem Relation Age of Onset   Ovarian cancer Mother    Heart attack Father    Emphysema Sister    Obesity Paternal Uncle      Current Facility-Administered Medications:    benzonatate (TESSALON) capsule 200 mg, 200 mg, Oral, TID PRN, Annice Needy, MD, 200 mg at 10/28/22 2218    ceFEPIme (MAXIPIME) 2 g in sodium chloride 0.9 % 100 mL IVPB, 2 g, Intravenous, Q8H, Patel, Sona, MD, Last Rate: 200 mL/hr at 10/31/22 1730, 2 g at 10/31/22 1730   dextrose 5 % and 0.45 % NaCl infusion, , Intravenous, Continuous, Mansy, Jan A, MD, Last Rate: 100 mL/hr at 10/31/22 1233, New Bag at 10/31/22 1233   heparin ADULT infusion 100 units/mL (  25000 units/268mL), 1,300 Units/hr, Intravenous, Continuous, Rockwell Alexandria, RPH, Last Rate: 12.5 mL/hr at 10/31/22 1236, 1,250 Units/hr at 10/31/22 1236   pantoprazole (PROTONIX) injection 40 mg, 40 mg, Intravenous, Daily, Enedina Finner, MD, 40 mg at 10/31/22 0827   senna-docusate (Senokot-S) tablet 1 tablet, 1 tablet, Oral, QHS PRN, Wyn Quaker, Marlow Baars, MD   trolamine salicylate (ASPERCREME) 10 % cream, , Topical, PRN, Enedina Finner, MD, Given at 10/28/22 2217  Facility-Administered Medications Ordered in Other Encounters:    sodium chloride flush (NS) 0.9 % injection 10 mL, 10 mL, Intravenous, Once, Rickard Patience, MD  Review of Systems  Unable to perform ROS: Mental status change    PHYSICAL EXAM Vitals:   10/31/22 1416 10/31/22 1505 10/31/22 1609 10/31/22 1700  BP:   112/71 108/64  Pulse: 93   90  Resp: (!) 31   (!) 29  Temp:   98.2 F (36.8 C)   TempSrc:   Oral   SpO2: 96% 94%  96%  Weight:      Height:       Physical Exam Constitutional:      Comments: Lethargic  HENT:     Head: Normocephalic and atraumatic.  Eyes:     General: No scleral icterus. Cardiovascular:     Rate and Rhythm: Normal rate and regular rhythm.  Pulmonary:     Comments: On nasal cannula oxygen.   Decreased breath sounds bilaterally. Breathing through the mouth. Abdominal:     General: There is no distension.     Palpations: Abdomen is soft.  Musculoskeletal:        General: No deformity.     Cervical back: Normal range of motion and neck supple.  Skin:    Findings: No erythema.  Neurological:     Motor: No abnormal muscle tone.     Comments: Patient remains  lethargic during my conversation with family members.  Not able to answer any questions.  Not able to access orientation  Psychiatric:        Mood and Affect: Affect normal.       LABORATORY STUDIES    Latest Ref Rng & Units 10/31/2022    4:45 AM 10/30/2022   12:37 PM 10/28/2022    6:36 AM  CBC  WBC 4.0 - 10.5 K/uL 12.2  14.4  13.9   Hemoglobin 13.0 - 17.0 g/dL 8.1  9.4  8.5   Hematocrit 39.0 - 52.0 % 26.3  30.1  26.1   Platelets 150 - 400 K/uL 354  363  315       Latest Ref Rng & Units 10/30/2022    8:44 AM 11/01/2022    3:55 AM 11/12/2022    1:29 PM  CMP  Glucose 70 - 99 mg/dL 454  098  119   BUN 8 - 23 mg/dL 24  14  17    Creatinine 0.61 - 1.24 mg/dL 1.47  8.29  5.62   Sodium 135 - 145 mmol/L 139  135  133   Potassium 3.5 - 5.1 mmol/L 3.9  3.7  3.7   Chloride 98 - 111 mmol/L 109  102  99   CO2 22 - 32 mmol/L 21  23  21    Calcium 8.9 - 10.3 mg/dL 9.0  8.8  9.1   Total Protein 6.5 - 8.1 g/dL   5.9   Total Bilirubin 0.3 - 1.2 mg/dL   0.7   Alkaline Phos 38 - 126 U/L   83   AST 15 - 41 U/L  Gray-scale sonography with graded compression, as well as color Doppler and duplex ultrasound were performed to evaluate the lower extremity deep venous systems from the level of the common femoral vein and including the common femoral, femoral, profunda femoral, popliteal and calf veins including the posterior tibial, peroneal and gastrocnemius veins when visible. The superficial great saphenous vein was also interrogated. Spectral Doppler was utilized to evaluate flow at rest and with distal augmentation maneuvers in the common femoral, femoral and popliteal veins. COMPARISON:  None Available. FINDINGS: Contralateral Common Femoral Vein: Respiratory phasicity  is normal and symmetric with the symptomatic side. No evidence of thrombus. Normal compressibility. Common Femoral Vein: No evidence of thrombus. Normal compressibility, respiratory phasicity and response to augmentation. Saphenofemoral Junction: There is mixed echogenic occlusive thrombus involving the saphenofemoral junction (image 14). Profunda Femoral Vein: No evidence of thrombus. Normal compressibility and flow on color Doppler imaging. Femoral Vein: No evidence of thrombus. Normal compressibility, respiratory phasicity and response to augmentation. Popliteal Vein: No evidence of thrombus. Normal compressibility, respiratory phasicity and response to augmentation. Calf Veins: There is hypoechoic occlusive thrombus involving one of the paired divisions of the right posterior tibial vein (images 35 and 36). The paired peroneal veins appear patent where imaged. Superficial Great Saphenous Vein: There is mixed echogenic occlusive thrombus throughout the interrogated course of the greater saphenous vein (images 7 through 13). Patient's palpable lump within the proximal aspect of the right calf correlates with an occluded segment of the greater saphenous vein (image 12). Other Findings:  None. IMPRESSION: 1. The examination is positive for occlusive DVT involving one of the paired divisions of the right posterior tibial vein. There is no extension of this distal tibial DVT to the more proximal venous system of the right lower extremity. 2. Examination is positive for occlusive superficial thrombophlebitis involving the saphenofemoral junction (not extending to involve the common femoral vein) as well as the interrogated course of the greater saphenous vein. Electronically Signed   By: Simonne Come M.D.   On: 11/12/2022 15:50   DG Chest Portable 1 View  Result Date: 11/15/2022 CLINICAL DATA:  Sepsis. EXAM: PORTABLE CHEST 1 VIEW COMPARISON:  X-ray 08/05/2022 FINDINGS: Right lung is grossly clear. Hyperinflation.  Right IJ chest port with the tip along the central SVC above the right atrium. Small left effusion with increasing mid to lower lung opacities. No pneumothorax. Calcified aorta. Overlapping cardiac leads. Apical pleural thickening. Degenerative changes of the spine. Osteopenia IMPRESSION: Small left effusion with developing mid to lower lung opacity. Acute infiltrate is possible. Recommend follow-up. Chest port Electronically Signed   By: Karen Kays M.D.   On: 10/27/2022 15:01   ECHOCARDIOGRAM COMPLETE  Result Date: 09/21/2022    ECHOCARDIOGRAM REPORT   Patient Name:   Wesley Young Date of Exam: 09/21/2022 Medical Rec #:  [id number]      Height:       72.0 in Accession #:    [id number]     Weight:       171.2 lb Date of Birth:  1940/08/19      BSA:          1.994 m Patient Age:    82 years       BP:           136/67 mmHg Patient Gender: M              HR:           63 bpm. Exam Location:  [facility] Procedure:  Gray-scale sonography with graded compression, as well as color Doppler and duplex ultrasound were performed to evaluate the lower extremity deep venous systems from the level of the common femoral vein and including the common femoral, femoral, profunda femoral, popliteal and calf veins including the posterior tibial, peroneal and gastrocnemius veins when visible. The superficial great saphenous vein was also interrogated. Spectral Doppler was utilized to evaluate flow at rest and with distal augmentation maneuvers in the common femoral, femoral and popliteal veins. COMPARISON:  None Available. FINDINGS: Contralateral Common Femoral Vein: Respiratory phasicity  is normal and symmetric with the symptomatic side. No evidence of thrombus. Normal compressibility. Common Femoral Vein: No evidence of thrombus. Normal compressibility, respiratory phasicity and response to augmentation. Saphenofemoral Junction: There is mixed echogenic occlusive thrombus involving the saphenofemoral junction (image 14). Profunda Femoral Vein: No evidence of thrombus. Normal compressibility and flow on color Doppler imaging. Femoral Vein: No evidence of thrombus. Normal compressibility, respiratory phasicity and response to augmentation. Popliteal Vein: No evidence of thrombus. Normal compressibility, respiratory phasicity and response to augmentation. Calf Veins: There is hypoechoic occlusive thrombus involving one of the paired divisions of the right posterior tibial vein (images 35 and 36). The paired peroneal veins appear patent where imaged. Superficial Great Saphenous Vein: There is mixed echogenic occlusive thrombus throughout the interrogated course of the greater saphenous vein (images 7 through 13). Patient's palpable lump within the proximal aspect of the right calf correlates with an occluded segment of the greater saphenous vein (image 12). Other Findings:  None. IMPRESSION: 1. The examination is positive for occlusive DVT involving one of the paired divisions of the right posterior tibial vein. There is no extension of this distal tibial DVT to the more proximal venous system of the right lower extremity. 2. Examination is positive for occlusive superficial thrombophlebitis involving the saphenofemoral junction (not extending to involve the common femoral vein) as well as the interrogated course of the greater saphenous vein. Electronically Signed   By: Simonne Come M.D.   On: 11/12/2022 15:50   DG Chest Portable 1 View  Result Date: 11/15/2022 CLINICAL DATA:  Sepsis. EXAM: PORTABLE CHEST 1 VIEW COMPARISON:  X-ray 08/05/2022 FINDINGS: Right lung is grossly clear. Hyperinflation.  Right IJ chest port with the tip along the central SVC above the right atrium. Small left effusion with increasing mid to lower lung opacities. No pneumothorax. Calcified aorta. Overlapping cardiac leads. Apical pleural thickening. Degenerative changes of the spine. Osteopenia IMPRESSION: Small left effusion with developing mid to lower lung opacity. Acute infiltrate is possible. Recommend follow-up. Chest port Electronically Signed   By: Karen Kays M.D.   On: 10/27/2022 15:01   ECHOCARDIOGRAM COMPLETE  Result Date: 09/21/2022    ECHOCARDIOGRAM REPORT   Patient Name:   Wesley Young Date of Exam: 09/21/2022 Medical Rec #:  [id number]      Height:       72.0 in Accession #:    [id number]     Weight:       171.2 lb Date of Birth:  1940/08/19      BSA:          1.994 m Patient Age:    82 years       BP:           136/67 mmHg Patient Gender: M              HR:           63 bpm. Exam Location:  [facility] Procedure:  Gray-scale sonography with graded compression, as well as color Doppler and duplex ultrasound were performed to evaluate the lower extremity deep venous systems from the level of the common femoral vein and including the common femoral, femoral, profunda femoral, popliteal and calf veins including the posterior tibial, peroneal and gastrocnemius veins when visible. The superficial great saphenous vein was also interrogated. Spectral Doppler was utilized to evaluate flow at rest and with distal augmentation maneuvers in the common femoral, femoral and popliteal veins. COMPARISON:  None Available. FINDINGS: Contralateral Common Femoral Vein: Respiratory phasicity  is normal and symmetric with the symptomatic side. No evidence of thrombus. Normal compressibility. Common Femoral Vein: No evidence of thrombus. Normal compressibility, respiratory phasicity and response to augmentation. Saphenofemoral Junction: There is mixed echogenic occlusive thrombus involving the saphenofemoral junction (image 14). Profunda Femoral Vein: No evidence of thrombus. Normal compressibility and flow on color Doppler imaging. Femoral Vein: No evidence of thrombus. Normal compressibility, respiratory phasicity and response to augmentation. Popliteal Vein: No evidence of thrombus. Normal compressibility, respiratory phasicity and response to augmentation. Calf Veins: There is hypoechoic occlusive thrombus involving one of the paired divisions of the right posterior tibial vein (images 35 and 36). The paired peroneal veins appear patent where imaged. Superficial Great Saphenous Vein: There is mixed echogenic occlusive thrombus throughout the interrogated course of the greater saphenous vein (images 7 through 13). Patient's palpable lump within the proximal aspect of the right calf correlates with an occluded segment of the greater saphenous vein (image 12). Other Findings:  None. IMPRESSION: 1. The examination is positive for occlusive DVT involving one of the paired divisions of the right posterior tibial vein. There is no extension of this distal tibial DVT to the more proximal venous system of the right lower extremity. 2. Examination is positive for occlusive superficial thrombophlebitis involving the saphenofemoral junction (not extending to involve the common femoral vein) as well as the interrogated course of the greater saphenous vein. Electronically Signed   By: Simonne Come M.D.   On: 11/12/2022 15:50   DG Chest Portable 1 View  Result Date: 11/15/2022 CLINICAL DATA:  Sepsis. EXAM: PORTABLE CHEST 1 VIEW COMPARISON:  X-ray 08/05/2022 FINDINGS: Right lung is grossly clear. Hyperinflation.  Right IJ chest port with the tip along the central SVC above the right atrium. Small left effusion with increasing mid to lower lung opacities. No pneumothorax. Calcified aorta. Overlapping cardiac leads. Apical pleural thickening. Degenerative changes of the spine. Osteopenia IMPRESSION: Small left effusion with developing mid to lower lung opacity. Acute infiltrate is possible. Recommend follow-up. Chest port Electronically Signed   By: Karen Kays M.D.   On: 10/27/2022 15:01   ECHOCARDIOGRAM COMPLETE  Result Date: 09/21/2022    ECHOCARDIOGRAM REPORT   Patient Name:   Wesley Young Date of Exam: 09/21/2022 Medical Rec #:  [id number]      Height:       72.0 in Accession #:    [id number]     Weight:       171.2 lb Date of Birth:  1940/08/19      BSA:          1.994 m Patient Age:    82 years       BP:           136/67 mmHg Patient Gender: M              HR:           63 bpm. Exam Location:  [facility] Procedure:  25000 units/268mL), 1,300 Units/hr, Intravenous, Continuous, Rockwell Alexandria, RPH, Last Rate: 12.5 mL/hr at 10/31/22 1236, 1,250 Units/hr at 10/31/22 1236   pantoprazole (PROTONIX) injection 40 mg, 40 mg, Intravenous, Daily, Enedina Finner, MD, 40 mg at 10/31/22 0827   senna-docusate (Senokot-S) tablet 1 tablet, 1 tablet, Oral, QHS PRN, Wyn Quaker, Marlow Baars, MD   trolamine salicylate (ASPERCREME) 10 % cream, , Topical, PRN, Enedina Finner, MD, Given at 10/28/22 2217  Facility-Administered Medications Ordered in Other Encounters:    sodium chloride flush (NS) 0.9 % injection 10 mL, 10 mL, Intravenous, Once, Rickard Patience, MD  Review of Systems  Unable to perform ROS: Mental status change    PHYSICAL EXAM Vitals:   10/31/22 1416 10/31/22 1505 10/31/22 1609 10/31/22 1700  BP:   112/71 108/64  Pulse: 93   90  Resp: (!) 31   (!) 29  Temp:   98.2 F (36.8 C)   TempSrc:   Oral   SpO2: 96% 94%  96%  Weight:      Height:       Physical Exam Constitutional:      Comments: Lethargic  HENT:     Head: Normocephalic and atraumatic.  Eyes:     General: No scleral icterus. Cardiovascular:     Rate and Rhythm: Normal rate and regular rhythm.  Pulmonary:     Comments: On nasal cannula oxygen.   Decreased breath sounds bilaterally. Breathing through the mouth. Abdominal:     General: There is no distension.     Palpations: Abdomen is soft.  Musculoskeletal:        General: No deformity.     Cervical back: Normal range of motion and neck supple.  Skin:    Findings: No erythema.  Neurological:     Motor: No abnormal muscle tone.     Comments: Patient remains  lethargic during my conversation with family members.  Not able to answer any questions.  Not able to access orientation  Psychiatric:        Mood and Affect: Affect normal.       LABORATORY STUDIES    Latest Ref Rng & Units 10/31/2022    4:45 AM 10/30/2022   12:37 PM 10/28/2022    6:36 AM  CBC  WBC 4.0 - 10.5 K/uL 12.2  14.4  13.9   Hemoglobin 13.0 - 17.0 g/dL 8.1  9.4  8.5   Hematocrit 39.0 - 52.0 % 26.3  30.1  26.1   Platelets 150 - 400 K/uL 354  363  315       Latest Ref Rng & Units 10/30/2022    8:44 AM 11/01/2022    3:55 AM 11/12/2022    1:29 PM  CMP  Glucose 70 - 99 mg/dL 454  098  119   BUN 8 - 23 mg/dL 24  14  17    Creatinine 0.61 - 1.24 mg/dL 1.47  8.29  5.62   Sodium 135 - 145 mmol/L 139  135  133   Potassium 3.5 - 5.1 mmol/L 3.9  3.7  3.7   Chloride 98 - 111 mmol/L 109  102  99   CO2 22 - 32 mmol/L 21  23  21    Calcium 8.9 - 10.3 mg/dL 9.0  8.8  9.1   Total Protein 6.5 - 8.1 g/dL   5.9   Total Bilirubin 0.3 - 1.2 mg/dL   0.7   Alkaline Phos 38 - 126 U/L   83   AST 15 - 41 U/L  25000 units/268mL), 1,300 Units/hr, Intravenous, Continuous, Rockwell Alexandria, RPH, Last Rate: 12.5 mL/hr at 10/31/22 1236, 1,250 Units/hr at 10/31/22 1236   pantoprazole (PROTONIX) injection 40 mg, 40 mg, Intravenous, Daily, Enedina Finner, MD, 40 mg at 10/31/22 0827   senna-docusate (Senokot-S) tablet 1 tablet, 1 tablet, Oral, QHS PRN, Wyn Quaker, Marlow Baars, MD   trolamine salicylate (ASPERCREME) 10 % cream, , Topical, PRN, Enedina Finner, MD, Given at 10/28/22 2217  Facility-Administered Medications Ordered in Other Encounters:    sodium chloride flush (NS) 0.9 % injection 10 mL, 10 mL, Intravenous, Once, Rickard Patience, MD  Review of Systems  Unable to perform ROS: Mental status change    PHYSICAL EXAM Vitals:   10/31/22 1416 10/31/22 1505 10/31/22 1609 10/31/22 1700  BP:   112/71 108/64  Pulse: 93   90  Resp: (!) 31   (!) 29  Temp:   98.2 F (36.8 C)   TempSrc:   Oral   SpO2: 96% 94%  96%  Weight:      Height:       Physical Exam Constitutional:      Comments: Lethargic  HENT:     Head: Normocephalic and atraumatic.  Eyes:     General: No scleral icterus. Cardiovascular:     Rate and Rhythm: Normal rate and regular rhythm.  Pulmonary:     Comments: On nasal cannula oxygen.   Decreased breath sounds bilaterally. Breathing through the mouth. Abdominal:     General: There is no distension.     Palpations: Abdomen is soft.  Musculoskeletal:        General: No deformity.     Cervical back: Normal range of motion and neck supple.  Skin:    Findings: No erythema.  Neurological:     Motor: No abnormal muscle tone.     Comments: Patient remains  lethargic during my conversation with family members.  Not able to answer any questions.  Not able to access orientation  Psychiatric:        Mood and Affect: Affect normal.       LABORATORY STUDIES    Latest Ref Rng & Units 10/31/2022    4:45 AM 10/30/2022   12:37 PM 10/28/2022    6:36 AM  CBC  WBC 4.0 - 10.5 K/uL 12.2  14.4  13.9   Hemoglobin 13.0 - 17.0 g/dL 8.1  9.4  8.5   Hematocrit 39.0 - 52.0 % 26.3  30.1  26.1   Platelets 150 - 400 K/uL 354  363  315       Latest Ref Rng & Units 10/30/2022    8:44 AM 11/01/2022    3:55 AM 11/12/2022    1:29 PM  CMP  Glucose 70 - 99 mg/dL 454  098  119   BUN 8 - 23 mg/dL 24  14  17    Creatinine 0.61 - 1.24 mg/dL 1.47  8.29  5.62   Sodium 135 - 145 mmol/L 139  135  133   Potassium 3.5 - 5.1 mmol/L 3.9  3.7  3.7   Chloride 98 - 111 mmol/L 109  102  99   CO2 22 - 32 mmol/L 21  23  21    Calcium 8.9 - 10.3 mg/dL 9.0  8.8  9.1   Total Protein 6.5 - 8.1 g/dL   5.9   Total Bilirubin 0.3 - 1.2 mg/dL   0.7   Alkaline Phos 38 - 126 U/L   83   AST 15 - 41 U/L  25000 units/268mL), 1,300 Units/hr, Intravenous, Continuous, Rockwell Alexandria, RPH, Last Rate: 12.5 mL/hr at 10/31/22 1236, 1,250 Units/hr at 10/31/22 1236   pantoprazole (PROTONIX) injection 40 mg, 40 mg, Intravenous, Daily, Enedina Finner, MD, 40 mg at 10/31/22 0827   senna-docusate (Senokot-S) tablet 1 tablet, 1 tablet, Oral, QHS PRN, Wyn Quaker, Marlow Baars, MD   trolamine salicylate (ASPERCREME) 10 % cream, , Topical, PRN, Enedina Finner, MD, Given at 10/28/22 2217  Facility-Administered Medications Ordered in Other Encounters:    sodium chloride flush (NS) 0.9 % injection 10 mL, 10 mL, Intravenous, Once, Rickard Patience, MD  Review of Systems  Unable to perform ROS: Mental status change    PHYSICAL EXAM Vitals:   10/31/22 1416 10/31/22 1505 10/31/22 1609 10/31/22 1700  BP:   112/71 108/64  Pulse: 93   90  Resp: (!) 31   (!) 29  Temp:   98.2 F (36.8 C)   TempSrc:   Oral   SpO2: 96% 94%  96%  Weight:      Height:       Physical Exam Constitutional:      Comments: Lethargic  HENT:     Head: Normocephalic and atraumatic.  Eyes:     General: No scleral icterus. Cardiovascular:     Rate and Rhythm: Normal rate and regular rhythm.  Pulmonary:     Comments: On nasal cannula oxygen.   Decreased breath sounds bilaterally. Breathing through the mouth. Abdominal:     General: There is no distension.     Palpations: Abdomen is soft.  Musculoskeletal:        General: No deformity.     Cervical back: Normal range of motion and neck supple.  Skin:    Findings: No erythema.  Neurological:     Motor: No abnormal muscle tone.     Comments: Patient remains  lethargic during my conversation with family members.  Not able to answer any questions.  Not able to access orientation  Psychiatric:        Mood and Affect: Affect normal.       LABORATORY STUDIES    Latest Ref Rng & Units 10/31/2022    4:45 AM 10/30/2022   12:37 PM 10/28/2022    6:36 AM  CBC  WBC 4.0 - 10.5 K/uL 12.2  14.4  13.9   Hemoglobin 13.0 - 17.0 g/dL 8.1  9.4  8.5   Hematocrit 39.0 - 52.0 % 26.3  30.1  26.1   Platelets 150 - 400 K/uL 354  363  315       Latest Ref Rng & Units 10/30/2022    8:44 AM 11/01/2022    3:55 AM 11/12/2022    1:29 PM  CMP  Glucose 70 - 99 mg/dL 454  098  119   BUN 8 - 23 mg/dL 24  14  17    Creatinine 0.61 - 1.24 mg/dL 1.47  8.29  5.62   Sodium 135 - 145 mmol/L 139  135  133   Potassium 3.5 - 5.1 mmol/L 3.9  3.7  3.7   Chloride 98 - 111 mmol/L 109  102  99   CO2 22 - 32 mmol/L 21  23  21    Calcium 8.9 - 10.3 mg/dL 9.0  8.8  9.1   Total Protein 6.5 - 8.1 g/dL   5.9   Total Bilirubin 0.3 - 1.2 mg/dL   0.7   Alkaline Phos 38 - 126 U/L   83   AST 15 - 41 U/L

## 2022-10-31 NOTE — Progress Notes (Signed)
SLP Cancellation Note  Patient Details Name: Wesley Young MRN: [id number] DOB: 07/22/1940   Cancelled treatment:       Reason Eval/Treat Not Completed: Medical issues which prohibited therapy (Per chart review, pt with red MEWS this AM due to increased HR and RR. Pt responding only to pain. Pt not appropriate for PO intake at this time. Will defer SLP efforts at this time.)  Clyde Canterbury, M.S., CCC-SLP Speech-Language Pathologist [facility] [phone] Arnette Felts)  Woodroe Chen 10/31/2022, 8:08 AM

## 2022-10-31 NOTE — Consult Note (Signed)
dorsum hand    Varicose veins of both lower extremities     PAST SURGICAL HISTORY:  Past Surgical History:  Procedure Laterality Date   CATARACT EXTRACTION W/PHACO Left 03/12/2019   Procedure: CATARACT EXTRACTION PHACO AND INTRAOCULAR LENS PLACEMENT (IOC) LEFT 5.27 00:38.6;  Surgeon: Wesley Manila, Young;  Location: [facility];  Service: Ophthalmology;  Laterality: Left;  sleep apnea   CATARACT EXTRACTION W/PHACO Right 04/09/2019   Procedure: CATARACT EXTRACTION PHACO AND INTRAOCULAR LENS PLACEMENT (IOC) RIGHT 4.84 00:52.4;  Surgeon: Wesley Manila, Young;  Location: [facility];  Service: Ophthalmology;  Laterality: Right;   colonoscopy with polypectomy  12/2016    COLONOSCOPY WITH PROPOFOL N/A 12/27/2016   Procedure: COLONOSCOPY WITH PROPOFOL;  Surgeon: Toledo, Boykin Nearing, Young;  Location: [facility] ENDOSCOPY;  Service: Endoscopy;  Laterality: N/A;   COLONOSCOPY WITH PROPOFOL N/A 03/23/2020   Procedure: COLONOSCOPY WITH PROPOFOL;  Surgeon: Toledo, Boykin Nearing, Young;  Location: [facility] ENDOS[facility];  Service: Gastroenterology;  Laterality: N/A;   ESOPHAGOGASTRODUODENOSCOPY (EGD) WITH PROPOFOL N/A 09/21/2017   Procedure: ESOPHAGOGASTRODUODENOSCOPY (EGD) WITH PROPOFOL;  Surgeon: Wesley Deem, Young;  Location: [facility];  Service: Endoscopy;  Laterality: N/A;   ESOPHAGOGASTRODUODENOSCOPY (EGD) WITH PROPOFOL N/A 03/23/2020   Procedure: ESOPHAGOGASTRODUODENOSCOPY (EGD) WITH PROPOFOL;  Surgeon: Toledo, Boykin Nearing, Young;  Location: [facility];  Service: Gastroenterology;  Laterality: N/A;   melonoma removal     MOHS SURGERY     on top of head   PORTA CATH INSERTION N/A 08/18/2022   Procedure: PORTA CATH INSERTION;  Surgeon: Wesley Needy, Young;  Location: [facility];  Service: Cardiovascular;  Laterality: N/A;   prostat biopsy x2     PULMONARY THROMBECTOMY Bilateral 11/04/2022   Procedure: PULMONARY THROMBECTOMY;  Surgeon: Wesley Needy, Young;  Location: [facility];  Service: Cardiovascular;  Laterality: Bilateral;   right deltoid resection in 1977     s/p resection of his right deltoid muscle in 1977     TONSILLECTOMY     TONSILLECTOMY AND ADENOIDECTOMY     VIDEO BRONCHOSCOPY WITH ENDOBRONCHIAL ULTRASOUND N/A 08/05/2022   Procedure: VIDEO BRONCHOSCOPY WITH ENDOBRONCHIAL ULTRASOUND;  Surgeon: Wesley Rigger, Young;  Location: [facility]S;  Service: Thoracic;  Laterality: N/A;    HEMATOLOGY/ONCOLOGY HISTORY:  Oncology History  Squamous cell carcinoma lung (HCC)  08/02/2022 Initial Diagnosis   Squamous cell carcinoma lung (HCC)  07/18/2022, patient developed hemoptysis nitially very small amount. Stopped for 3 days and again patient experienced hemoptysis  which prompted him to go to emergency room on 08/02/2022 for further evaluation. CT findings showed 7.1 x 6.0 x 4.4 cm partially necrotic central left lung mass, extensive left hilar adenopathy and borderline enlarged prevascular and subcarinal lymph nodes.  08/05/2022 patient underwent biopsy via bronchoscopy by Dr. Karna Young. Left lower lobe ENB assisted biopsy showed squamous cell carcinoma. Left lower lobe lavage-suspicious for malignancy Station 10 R lymph node FNA negative for malignancy Left lower lobe bronchoscopy with brushing-positive for malignancy-non-small cell carcinoma Left lower lobe bronchoscopy with FNA positive for malignancy-non-small cell carcinoma Station 7 lymph node FNA-negative Station 10 L lymph node FNA suspicious for malignancy     08/02/2022 Imaging   CT chest with contrast showed 1. 7.1 x 6.0 x 4.4 cm partially necrotic central left lung mass involving both the inferior aspect of the left upper lobe and the superior aspect of the left lower lobe. Findings consistent with a primary neoplasm. Recommend bronchoscopic biopsy. 2. Extensive left hilar adenopathy and borderline enlarged prevascular and  LV E/e' medial:  6.6 LV IVS:        0.70 cm LV e' lateral:   8.43 cm/s                        LV E/e' lateral: 6.2  RIGHT VENTRICLE             IVC RV Basal diam:  4.20 cm     IVC diam: 2.20 cm RV S prime:     10.18 cm/s TAPSE (M-mode): 2.3 cm LEFT ATRIUM             Index        RIGHT ATRIUM           Index LA diam:        3.70 cm 1.91 cm/m   RA Area:     18.50 cm LA Vol (A2C):   45.1 ml 23.34 ml/m  RA Volume:   59.70 ml  30.89 ml/m LA Vol (A4C):   65.3 ml 33.79 ml/m LA Biplane Vol: 58.1 ml 30.06 ml/m  AORTIC VALVE LVOT Vmax:    99.57 cm/s LVOT Vmean:  65.667 cm/s LVOT VTI:    0.176 m AI PHT:      447 msec  AORTA Ao Root diam: 4.00 cm Ao Asc diam:  4.10 cm MITRAL VALVE               TRICUSPID VALVE MV Area (PHT): 3.85 cm    TR Peak grad:   35.3 mmHg MV Decel Time: 197 msec    TR Vmax:        297.00 cm/s MV E velocity: 51.90 cm/s MV A velocity: 95.40 cm/s  SHUNTS MV E/A ratio:  0.54        Systemic VTI: 0.18 m Wesley Young Electronically signed by Wesley Young Signature Date/Time: 11/18/2022/1:28:02 PM    Final    CT Angio Chest PE W and/or Wo Contrast  Result Date: 11/01/2022 CLINICAL DATA:  active lung cancer, new shortness of breath with hypoxia, right leg swelling, concern for PE EXAM: CT ANGIOGRAPHY CHEST WITH CONTRAST TECHNIQUE: Multidetector CT imaging of the chest was performed using the standard protocol during bolus administration of intravenous contrast. Multiplanar CT image reconstructions and MIPs were obtained to evaluate the vascular anatomy. RADIATION DOSE REDUCTION: This exam was performed according to the departmental dose-optimization program which includes automated exposure control, adjustment of the mA and/or kV according to patient size and/or use of iterative reconstruction technique. CONTRAST:  75mL OMNIPAQUE IOHEXOL 350 MG/ML SOLN COMPARISON:  08/04/2022 FINDINGS: Cardiovascular: SVC patent. Mild cardiomegaly with right-sided enlargement, mild RV dilatation. No pericardial effusion. Acute PE in right lower lobe pulmonary artery, extending into segmental branches, as well as smaller segmental and subsegmental emboli 2 right upper and middle lobes. Subsegmental emboli in the left upper and lower lobes. Scattered coronary calcifications. Adequate contrast opacification of the thoracic aorta with no evidence of dissection, aneurysm, or stenosis. There is classic 3-vessel brachiocephalic arch anatomy without proximal stenosis. Scattered atheromatous plaque in the arch and descending thoracic segment.  Mediastinum/Nodes: No mediastinal hematoma, mass, or adenopathy. Lungs/Pleura: Small left pleural effusion, new since previous. Extensive interstitial and airspace opacities throughout most of the left lung, new since previous. Cavitation in the previously identified posterior left upper lobe mass which abuts the hilum and mediastinum. Scattered ground-glass opacities in the medial and posterior right lower lobe are new since previous. Upper Abdomen: No acute findings. Musculoskeletal:  dorsum hand    Varicose veins of both lower extremities     PAST SURGICAL HISTORY:  Past Surgical History:  Procedure Laterality Date   CATARACT EXTRACTION W/PHACO Left 03/12/2019   Procedure: CATARACT EXTRACTION PHACO AND INTRAOCULAR LENS PLACEMENT (IOC) LEFT 5.27 00:38.6;  Surgeon: Wesley Manila, Young;  Location: [facility];  Service: Ophthalmology;  Laterality: Left;  sleep apnea   CATARACT EXTRACTION W/PHACO Right 04/09/2019   Procedure: CATARACT EXTRACTION PHACO AND INTRAOCULAR LENS PLACEMENT (IOC) RIGHT 4.84 00:52.4;  Surgeon: Wesley Manila, Young;  Location: [facility];  Service: Ophthalmology;  Laterality: Right;   colonoscopy with polypectomy  12/2016    COLONOSCOPY WITH PROPOFOL N/A 12/27/2016   Procedure: COLONOSCOPY WITH PROPOFOL;  Surgeon: Toledo, Boykin Nearing, Young;  Location: [facility];  Service: Endoscopy;  Laterality: N/A;   COLONOSCOPY WITH PROPOFOL N/A 03/23/2020   Procedure: COLONOSCOPY WITH PROPOFOL;  Surgeon: Toledo, Boykin Nearing, Young;  Location: [facility];  Service: Gastroenterology;  Laterality: N/A;   ESOPHAGOGASTRODUODENOSCOPY (EGD) WITH PROPOFOL N/A 09/21/2017   Procedure: ESOPHAGOGASTRODUODENOSCOPY (EGD) WITH PROPOFOL;  Surgeon: Wesley Deem, Young;  Location: [facility] END[facility];  Service: Endoscopy;  Laterality: N/A;   ESOPHAGOGASTRODUODENOSCOPY (EGD) WITH PROPOFOL N/A 03/23/2020   Procedure: ESOPHAGOGASTRODUODENOSCOPY (EGD) WITH PROPOFOL;  Surgeon: Toledo, Boykin Nearing, Young;  Location: [facility];  Service: Gastroenterology;  Laterality: N/A;   melonoma removal     MOHS SURGERY     on top of head   PORTA CATH INSERTION N/A 08/18/2022   Procedure: PORTA CATH INSERTION;  Surgeon: Wesley Needy, Young;  Location: [facility];  Service: Cardiovascular;  Laterality: N/A;   prostat biopsy x2     PULMONARY THROMBECTOMY Bilateral 11/04/2022   Procedure: PULMONARY THROMBECTOMY;  Surgeon: Wesley Needy, Young;  Location: [facility];  Service: Cardiovascular;  Laterality: Bilateral;   right deltoid resection in 1977     s/p resection of his right deltoid muscle in 1977     TONSILLECTOMY     TONSILLECTOMY AND ADENOIDECTOMY     VIDEO BRONCHOSCOPY WITH ENDOBRONCHIAL ULTRASOUND N/A 08/05/2022   Procedure: VIDEO BRONCHOSCOPY WITH ENDOBRONCHIAL ULTRASOUND;  Surgeon: Wesley Rigger, Young;  Location: [facility];  Service: Thoracic;  Laterality: N/A;    HEMATOLOGY/ONCOLOGY HISTORY:  Oncology History  Squamous cell carcinoma lung (HCC)  08/02/2022 Initial Diagnosis   Squamous cell carcinoma lung (HCC)  07/18/2022, patient developed hemoptysis nitially very small amount. Stopped for 3 days and again patient experienced hemoptysis  which prompted him to go to emergency room on 08/02/2022 for further evaluation. CT findings showed 7.1 x 6.0 x 4.4 cm partially necrotic central left lung mass, extensive left hilar adenopathy and borderline enlarged prevascular and subcarinal lymph nodes.  08/05/2022 patient underwent biopsy via bronchoscopy by Dr. Karna Young. Left lower lobe ENB assisted biopsy showed squamous cell carcinoma. Left lower lobe lavage-suspicious for malignancy Station 10 R lymph node FNA negative for malignancy Left lower lobe bronchoscopy with brushing-positive for malignancy-non-small cell carcinoma Left lower lobe bronchoscopy with FNA positive for malignancy-non-small cell carcinoma Station 7 lymph node FNA-negative Station 10 L lymph node FNA suspicious for malignancy     08/02/2022 Imaging   CT chest with contrast showed 1. 7.1 x 6.0 x 4.4 cm partially necrotic central left lung mass involving both the inferior aspect of the left upper lobe and the superior aspect of the left lower lobe. Findings consistent with a primary neoplasm. Recommend bronchoscopic biopsy. 2. Extensive left hilar adenopathy and borderline enlarged prevascular and  LV E/e' medial:  6.6 LV IVS:        0.70 cm LV e' lateral:   8.43 cm/s                        LV E/e' lateral: 6.2  RIGHT VENTRICLE             IVC RV Basal diam:  4.20 cm     IVC diam: 2.20 cm RV S prime:     10.18 cm/s TAPSE (M-mode): 2.3 cm LEFT ATRIUM             Index        RIGHT ATRIUM           Index LA diam:        3.70 cm 1.91 cm/m   RA Area:     18.50 cm LA Vol (A2C):   45.1 ml 23.34 ml/m  RA Volume:   59.70 ml  30.89 ml/m LA Vol (A4C):   65.3 ml 33.79 ml/m LA Biplane Vol: 58.1 ml 30.06 ml/m  AORTIC VALVE LVOT Vmax:    99.57 cm/s LVOT Vmean:  65.667 cm/s LVOT VTI:    0.176 m AI PHT:      447 msec  AORTA Ao Root diam: 4.00 cm Ao Asc diam:  4.10 cm MITRAL VALVE               TRICUSPID VALVE MV Area (PHT): 3.85 cm    TR Peak grad:   35.3 mmHg MV Decel Time: 197 msec    TR Vmax:        297.00 cm/s MV E velocity: 51.90 cm/s MV A velocity: 95.40 cm/s  SHUNTS MV E/A ratio:  0.54        Systemic VTI: 0.18 m Wesley Young Electronically signed by Wesley Young Signature Date/Time: 11/18/2022/1:28:02 PM    Final    CT Angio Chest PE W and/or Wo Contrast  Result Date: 11/01/2022 CLINICAL DATA:  active lung cancer, new shortness of breath with hypoxia, right leg swelling, concern for PE EXAM: CT ANGIOGRAPHY CHEST WITH CONTRAST TECHNIQUE: Multidetector CT imaging of the chest was performed using the standard protocol during bolus administration of intravenous contrast. Multiplanar CT image reconstructions and MIPs were obtained to evaluate the vascular anatomy. RADIATION DOSE REDUCTION: This exam was performed according to the departmental dose-optimization program which includes automated exposure control, adjustment of the mA and/or kV according to patient size and/or use of iterative reconstruction technique. CONTRAST:  75mL OMNIPAQUE IOHEXOL 350 MG/ML SOLN COMPARISON:  08/04/2022 FINDINGS: Cardiovascular: SVC patent. Mild cardiomegaly with right-sided enlargement, mild RV dilatation. No pericardial effusion. Acute PE in right lower lobe pulmonary artery, extending into segmental branches, as well as smaller segmental and subsegmental emboli 2 right upper and middle lobes. Subsegmental emboli in the left upper and lower lobes. Scattered coronary calcifications. Adequate contrast opacification of the thoracic aorta with no evidence of dissection, aneurysm, or stenosis. There is classic 3-vessel brachiocephalic arch anatomy without proximal stenosis. Scattered atheromatous plaque in the arch and descending thoracic segment.  Mediastinum/Nodes: No mediastinal hematoma, mass, or adenopathy. Lungs/Pleura: Small left pleural effusion, new since previous. Extensive interstitial and airspace opacities throughout most of the left lung, new since previous. Cavitation in the previously identified posterior left upper lobe mass which abuts the hilum and mediastinum. Scattered ground-glass opacities in the medial and posterior right lower lobe are new since previous. Upper Abdomen: No acute findings. Musculoskeletal:  LV E/e' medial:  6.6 LV IVS:        0.70 cm LV e' lateral:   8.43 cm/s                        LV E/e' lateral: 6.2  RIGHT VENTRICLE             IVC RV Basal diam:  4.20 cm     IVC diam: 2.20 cm RV S prime:     10.18 cm/s TAPSE (M-mode): 2.3 cm LEFT ATRIUM             Index        RIGHT ATRIUM           Index LA diam:        3.70 cm 1.91 cm/m   RA Area:     18.50 cm LA Vol (A2C):   45.1 ml 23.34 ml/m  RA Volume:   59.70 ml  30.89 ml/m LA Vol (A4C):   65.3 ml 33.79 ml/m LA Biplane Vol: 58.1 ml 30.06 ml/m  AORTIC VALVE LVOT Vmax:    99.57 cm/s LVOT Vmean:  65.667 cm/s LVOT VTI:    0.176 m AI PHT:      447 msec  AORTA Ao Root diam: 4.00 cm Ao Asc diam:  4.10 cm MITRAL VALVE               TRICUSPID VALVE MV Area (PHT): 3.85 cm    TR Peak grad:   35.3 mmHg MV Decel Time: 197 msec    TR Vmax:        297.00 cm/s MV E velocity: 51.90 cm/s MV A velocity: 95.40 cm/s  SHUNTS MV E/A ratio:  0.54        Systemic VTI: 0.18 m Wesley Young Electronically signed by Wesley Young Signature Date/Time: 11/18/2022/1:28:02 PM    Final    CT Angio Chest PE W and/or Wo Contrast  Result Date: 11/01/2022 CLINICAL DATA:  active lung cancer, new shortness of breath with hypoxia, right leg swelling, concern for PE EXAM: CT ANGIOGRAPHY CHEST WITH CONTRAST TECHNIQUE: Multidetector CT imaging of the chest was performed using the standard protocol during bolus administration of intravenous contrast. Multiplanar CT image reconstructions and MIPs were obtained to evaluate the vascular anatomy. RADIATION DOSE REDUCTION: This exam was performed according to the departmental dose-optimization program which includes automated exposure control, adjustment of the mA and/or kV according to patient size and/or use of iterative reconstruction technique. CONTRAST:  75mL OMNIPAQUE IOHEXOL 350 MG/ML SOLN COMPARISON:  08/04/2022 FINDINGS: Cardiovascular: SVC patent. Mild cardiomegaly with right-sided enlargement, mild RV dilatation. No pericardial effusion. Acute PE in right lower lobe pulmonary artery, extending into segmental branches, as well as smaller segmental and subsegmental emboli 2 right upper and middle lobes. Subsegmental emboli in the left upper and lower lobes. Scattered coronary calcifications. Adequate contrast opacification of the thoracic aorta with no evidence of dissection, aneurysm, or stenosis. There is classic 3-vessel brachiocephalic arch anatomy without proximal stenosis. Scattered atheromatous plaque in the arch and descending thoracic segment.  Mediastinum/Nodes: No mediastinal hematoma, mass, or adenopathy. Lungs/Pleura: Small left pleural effusion, new since previous. Extensive interstitial and airspace opacities throughout most of the left lung, new since previous. Cavitation in the previously identified posterior left upper lobe mass which abuts the hilum and mediastinum. Scattered ground-glass opacities in the medial and posterior right lower lobe are new since previous. Upper Abdomen: No acute findings. Musculoskeletal:  dorsum hand    Varicose veins of both lower extremities     PAST SURGICAL HISTORY:  Past Surgical History:  Procedure Laterality Date   CATARACT EXTRACTION W/PHACO Left 03/12/2019   Procedure: CATARACT EXTRACTION PHACO AND INTRAOCULAR LENS PLACEMENT (IOC) LEFT 5.27 00:38.6;  Surgeon: Wesley Manila, Young;  Location: [facility];  Service: Ophthalmology;  Laterality: Left;  sleep apnea   CATARACT EXTRACTION W/PHACO Right 04/09/2019   Procedure: CATARACT EXTRACTION PHACO AND INTRAOCULAR LENS PLACEMENT (IOC) RIGHT 4.84 00:52.4;  Surgeon: Wesley Manila, Young;  Location: [facility];  Service: Ophthalmology;  Laterality: Right;   colonoscopy with polypectomy  12/2016    COLONOSCOPY WITH PROPOFOL N/A 12/27/2016   Procedure: COLONOSCOPY WITH PROPOFOL;  Surgeon: Toledo, Boykin Nearing, Young;  Location: [facility];  Service: Endoscopy;  Laterality: N/A;   COLONOSCOPY WITH PROPOFOL N/A 03/23/2020   Procedure: COLONOSCOPY WITH PROPOFOL;  Surgeon: Toledo, Boykin Nearing, Young;  Location: [facility];  Service: Gastroenterology;  Laterality: N/A;   ESOPHAGOGASTRODUODENOSCOPY (EGD) WITH PROPOFOL N/A 09/21/2017   Procedure: ESOPHAGOGASTRODUODENOSCOPY (EGD) WITH PROPOFOL;  Surgeon: Wesley Deem, Young;  Location: [facility];  Service: Endoscopy;  Laterality: N/A;   ESOPHAGOGASTRODUODENOSCOPY (EGD) WITH PROPOFOL N/A 03/23/2020   Procedure: ESOPHAGOGASTRODUODENOSCOPY (EGD) WITH PROPOFOL;  Surgeon: Toledo, Boykin Nearing, Young;  Location: [facility];  Service: Gastroenterology;  Laterality: N/A;   melonoma removal     MOHS SURGERY     on top of head   PORTA CATH INSERTION N/A 08/18/2022   Procedure: PORTA CATH INSERTION;  Surgeon: Wesley Needy, Young;  Location: [facility];  Service: Cardiovascular;  Laterality: N/A;   prostat biopsy x2     PULMONARY THROMBECTOMY Bilateral 11/04/2022   Procedure: PULMONARY THROMBECTOMY;  Surgeon: Wesley Needy, Young;  Location: [facility];  Service: Cardiovascular;  Laterality: Bilateral;   right deltoid resection in 1977     s/p resection of his right deltoid muscle in 1977     TONSILLECTOMY     TONSILLECTOMY AND ADENOIDECTOMY     VIDEO BRONCHOSCOPY WITH ENDOBRONCHIAL ULTRASOUND N/A 08/05/2022   Procedure: VIDEO BRONCHOSCOPY WITH ENDOBRONCHIAL ULTRASOUND;  Surgeon: Wesley Rigger, Young;  Location: [facility];  Service: Thoracic;  Laterality: N/A;    HEMATOLOGY/ONCOLOGY HISTORY:  Oncology History  Squamous cell carcinoma lung (HCC)  08/02/2022 Initial Diagnosis   Squamous cell carcinoma lung (HCC)  07/18/2022, patient developed hemoptysis nitially very small amount. Stopped for 3 days and again patient experienced hemoptysis  which prompted him to go to emergency room on 08/02/2022 for further evaluation. CT findings showed 7.1 x 6.0 x 4.4 cm partially necrotic central left lung mass, extensive left hilar adenopathy and borderline enlarged prevascular and subcarinal lymph nodes.  08/05/2022 patient underwent biopsy via bronchoscopy by Dr. Karna Young. Left lower lobe ENB assisted biopsy showed squamous cell carcinoma. Left lower lobe lavage-suspicious for malignancy Station 10 R lymph node FNA negative for malignancy Left lower lobe bronchoscopy with brushing-positive for malignancy-non-small cell carcinoma Left lower lobe bronchoscopy with FNA positive for malignancy-non-small cell carcinoma Station 7 lymph node FNA-negative Station 10 L lymph node FNA suspicious for malignancy     08/02/2022 Imaging   CT chest with contrast showed 1. 7.1 x 6.0 x 4.4 cm partially necrotic central left lung mass involving both the inferior aspect of the left upper lobe and the superior aspect of the left lower lobe. Findings consistent with a primary neoplasm. Recommend bronchoscopic biopsy. 2. Extensive left hilar adenopathy and borderline enlarged prevascular and  ECHOCARDIOGRAM COMPLETE  Result Date: 11/10/2022    ECHOCARDIOGRAM REPORT   Patient Name:   Wesley Young Date of Exam: 11/07/2022 Medical Rec #:  [id number]      Height:       72.0 in Accession #:    [id number]     Weight:       159.0 lb Date of Birth:  May 23, 1940      BSA:          1.933 m Patient Age:    82 years       BP:           152/80 mmHg Patient Gender: M              HR:           85 bpm. Exam Location:  [facility] Procedure: 2D Echo, Cardiac Doppler and Color Doppler Indications:     I26.09 Pulmonary embolus  History:         Patient has prior history of Echocardiogram examinations, most                  recent 09/21/2022. CHF, Arrythmias:Atrial Fibrillation and                  Atrial Flutter, Signs/Symptoms:Dyspnea; Risk                  Factors:Hypertension, Dyslipidemia and Sleep Apnea.  Sonographer:     Daphine Deutscher RDCS Referring Phys:  [id number] AMY N COX Diagnosing Phys: Wesley Young IMPRESSIONS  1. Left ventricular ejection fraction, by estimation, is 60 to 65%. The left ventricle has normal function. The left ventricle has no regional wall motion abnormalities. Left ventricular diastolic parameters are consistent with Grade I diastolic dysfunction (impaired relaxation).  2. Right ventricular systolic function is normal. The right ventricular size is normal.  3. The mitral valve is normal in structure. Trivial mitral valve regurgitation.  4. The aortic valve is calcified. Aortic valve regurgitation is mild. Aortic valve sclerosis/calcification is present, without any evidence of aortic stenosis. FINDINGS  Left Ventricle: Left ventricular ejection fraction, by estimation, is  60 to 65%. The left ventricle has normal function. The left ventricle has no regional wall motion abnormalities. The left ventricular internal cavity size was normal in size. There is  no left ventricular hypertrophy. Left ventricular diastolic parameters are consistent with Grade I diastolic dysfunction (impaired relaxation). Right Ventricle: The right ventricular size is normal. No increase in right ventricular wall thickness. Right ventricular systolic function is normal. Left Atrium: Left atrial size was normal in size. Right Atrium: Right atrial size was normal in size. Pericardium: There is no evidence of pericardial effusion. Mitral Valve: The mitral valve is normal in structure. Trivial mitral valve regurgitation. Tricuspid Valve: The tricuspid valve is normal in structure. Tricuspid valve regurgitation is mild. Aortic Valve: The aortic valve is calcified. Aortic valve regurgitation is mild. Aortic regurgitation PHT measures 447 msec. Aortic valve sclerosis/calcification is present, without any evidence of aortic stenosis. Pulmonic Valve: The pulmonic valve was normal in structure. Pulmonic valve regurgitation is not visualized. Aorta: The ascending aorta was not well visualized. IAS/Shunts: No atrial level shunt detected by color flow Doppler.  LEFT VENTRICLE PLAX 2D LVIDd:         5.10 cm Diastology LVIDs:         3.30 cm LV e' medial:    7.84 cm/s LV PW:         0.70 cm  ECHOCARDIOGRAM COMPLETE  Result Date: 11/10/2022    ECHOCARDIOGRAM REPORT   Patient Name:   Wesley Young Date of Exam: 11/07/2022 Medical Rec #:  [id number]      Height:       72.0 in Accession #:    [id number]     Weight:       159.0 lb Date of Birth:  May 23, 1940      BSA:          1.933 m Patient Age:    82 years       BP:           152/80 mmHg Patient Gender: M              HR:           85 bpm. Exam Location:  [facility] Procedure: 2D Echo, Cardiac Doppler and Color Doppler Indications:     I26.09 Pulmonary embolus  History:         Patient has prior history of Echocardiogram examinations, most                  recent 09/21/2022. CHF, Arrythmias:Atrial Fibrillation and                  Atrial Flutter, Signs/Symptoms:Dyspnea; Risk                  Factors:Hypertension, Dyslipidemia and Sleep Apnea.  Sonographer:     Daphine Deutscher RDCS Referring Phys:  [id number] AMY N [facility] Diagnosing Phys: Wesley Young IMPRESSIONS  1. Left ventricular ejection fraction, by estimation, is 60 to 65%. The left ventricle has normal function. The left ventricle has no regional wall motion abnormalities. Left ventricular diastolic parameters are consistent with Grade I diastolic dysfunction (impaired relaxation).  2. Right ventricular systolic function is normal. The right ventricular size is normal.  3. The mitral valve is normal in structure. Trivial mitral valve regurgitation.  4. The aortic valve is calcified. Aortic valve regurgitation is mild. Aortic valve sclerosis/calcification is present, without any evidence of aortic stenosis. FINDINGS  Left Ventricle: Left ventricular ejection fraction, by estimation, is  60 to 65%. The left ventricle has normal function. The left ventricle has no regional wall motion abnormalities. The left ventricular internal cavity size was normal in size. There is  no left ventricular hypertrophy. Left ventricular diastolic parameters are consistent with Grade I diastolic dysfunction (impaired relaxation). Right Ventricle: The right ventricular size is normal. No increase in right ventricular wall thickness. Right ventricular systolic function is normal. Left Atrium: Left atrial size was normal in size. Right Atrium: Right atrial size was normal in size. Pericardium: There is no evidence of pericardial effusion. Mitral Valve: The mitral valve is normal in structure. Trivial mitral valve regurgitation. Tricuspid Valve: The tricuspid valve is normal in structure. Tricuspid valve regurgitation is mild. Aortic Valve: The aortic valve is calcified. Aortic valve regurgitation is mild. Aortic regurgitation PHT measures 447 msec. Aortic valve sclerosis/calcification is present, without any evidence of aortic stenosis. Pulmonic Valve: The pulmonic valve was normal in structure. Pulmonic valve regurgitation is not visualized. Aorta: The ascending aorta was not well visualized. IAS/Shunts: No atrial level shunt detected by color flow Doppler.  LEFT VENTRICLE PLAX 2D LVIDd:         5.10 cm Diastology LVIDs:         3.30 cm LV e' medial:    7.84 cm/s LV PW:         0.70 cm

## 2022-10-31 NOTE — Progress Notes (Signed)
[facility]  - [place] at [facility]   PATIENT NAME: Wesley Young    MR#:  [id number]  DATE OF BIRTH:  Oct 23, 1940  SUBJECTIVE:  Wife  at bedside. Patient still remains sleepy. Opens eyes to verbal commands. Appears confused and unable to produce words. Patient breathes very heavy with mouth open and has some jerky movements. Wife says this is his usual. He was able to squeeze my fingers on both sides. Unable to do other neuro- exam. VITALS:  Blood pressure (!) 144/76, pulse 93, temperature 98 F (36.7 C), temperature source Axillary, resp. rate (!) 31, height 6' (1.829 m), weight 78.3 kg, SpO2 96%.  PHYSICAL EXAMINATION:  appears weak GENERAL:  82 y.o.-year-old patient with acute distress. Confused, lethargic LUNGS: decreased breath sounds bilaterally. Breathing through the mouth. CARDIOVASCULAR: S1, S2 normal. No murmur, mild tachycardia ABDOMEN: Soft, nontender, nondistended.  EXTREMITIES: No  edema b/l.    NEUROLOGIC: unable  to assess completely. Quite lethargic. Was able to squeeze my fingers momentarily. Unable to hold meaningful conversation Pressure Injury 10/30/22 Sacrum Right;Left;Medial Stage 1 -  Intact skin with non-blanchable redness of a localized area usually over a bony prominence. (Active)  10/30/22 0900  Location: Sacrum  Location Orientation: Right;Left;Medial  Staging: Stage 1 -  Intact skin with non-blanchable redness of a localized area usually over a bony prominence.  Wound Description (Comments):   Present on Admission:       LABORATORY PANEL:  CBC Recent Labs  Lab 10/31/22 0445  WBC 12.2*  HGB 8.1*  HCT 26.3*  PLT 354    Chemistries  Recent Labs  Lab 11/07/2022 1329 11/15/2022 0355 10/30/22 0844  NA 133*   < > 139  K 3.7   < > 3.9  CL 99   < > 109  CO2 21*   < > 21*  GLUCOSE 212*   < > 122*  BUN 17   < > 24*  CREATININE 0.93   < > 0.56*  CALCIUM 9.1   < > 9.0  AST 33  --   --   ALT 37  --   --   ALKPHOS 83  --   --    BILITOT 0.7  --   --    < > = values in this interval not displayed.   Cardiac Enzymes No results for input(s): "TROPONINI" in the last 168 hours. RADIOLOGY:  DG Chest Port 1 View  Result Date: 10/31/2022 CLINICAL DATA:  Cough, pulmonary emboli EXAM: PORTABLE CHEST 1 VIEW COMPARISON:  Chest CT and chest radiograph 10/23/2022 FINDINGS: The right chest wall port is stable with the tip terminating in the mid SVC. The cardiomediastinal silhouette is grossly stable. Extensive airspace opacity throughout the left lung with an associated small effusion are not significantly changed. There is no significant airspace disease on the right. There is no right effusion. There is no pneumothorax There is no acute osseous abnormality. IMPRESSION: Unchanged extensive airspace opacity throughout the left lung with a small left pleural effusion. Electronically Signed   By: Lesia Hausen M.D.   On: 10/31/2022 10:20   MR BRAIN WO CONTRAST  Result Date: 10/30/2022 CLINICAL DATA:  Mental status change, unknown cause EXAM: MRI HEAD WITHOUT CONTRAST TECHNIQUE: Multiplanar, multiecho pulse sequences of the brain and surrounding structures were obtained without intravenous contrast. COMPARISON:  Brain MR 08/16/22 FINDINGS: Brain: There are innumerable predominantly cortical in the bilateral frontal, parietal, occipital lobes. Acute infarcts are also present in the right cerebellar hemisphere.  Some infarcts are seen in a watershed distribution (series 5, image 96). No hemorrhage. No hydrocephalus. No extra-axial fluid collection. Sequela of mild overall microvascular ischemic change. Generalized volume loss. Assessment for the presence of intracranial metastatic disease is limited in the absence of IV contrast. Previously seen intracranial metastases in the left frontal and right occipital lobe are not definitively visualized this exam. Vascular: Normal flow voids. Skull and upper cervical spine: Normal marrow signal.  Sinuses/Orbits: Small left mastoid effusion. No middle ear effusion. Paranasal sinuses are clear. Bilateral lens replacement. Orbits are otherwise unremarkable. Other: None. IMPRESSION: 1. Innumerable predominantly cortical infarcts in the bilateral frontal, parietal, occipital lobes and right cerebellar hemisphere. Some acute infarcts are also present in the watershed distribution bilaterally. Findings are concerning for a central embolic source. 2. Assessment for the presence of intracranial metastatic disease is limited in the absence of IV contrast. Previously seen intracranial metastases in the left frontal and right occipital lobe are not definitively visualized this exam. These results will be called to the ordering clinician or representative by the Radiologist Assistant, and communication documented in the PACS or [employer]. Electronically Signed   By: Lorenza Cambridge M.D.   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 Some acute infarcts are also present in the watershed distribution bilaterally. Findings are concerning for a central embolic source. --NPO --neurology consultation with Dr. Otelia Limes case discussed. -- Speech therapy to see patient -- hold PO meds. Change eliquis to IV heparin drip. Neurology is okay with it. --IV fluids -- neurology signed off. -- Patient is quite lethargic  History of PAF, PAC, PVC, dysrhythmia noted in cardiology Dr. Darrold Junker note 06/2022 -- patient has had Holter monitor and 2022 did not show evidence of a fib. --  Not on any oral anticoagulation at home. -- EKG on admission showed sinus rhythm with nonspecific STT changes -- remains on telemetry heartrate 70s to 90s  Bilateral pulmonary embolism (HCC) Right LE DVT (posterior Tibial vein) --Heparin gtt --Echo showed EF 60-65% , mild LVH (grade 1 DD) --Vascular consultation with Dr. Wyn Quaker. Patient is status post Mechanical thrombectomy using the penumbra CAT 8 device to the right lower lobe, middle lobe, and upper lobe pulmonary arteries in the left lower lobe pulmonary artery  -- try to wean oxygen down as able to. Will continue heparin drip for today and transition to oral eliquis tomorrow -- vascular okay to transition to eliquis. --9/8--change to IV heparin gtt  Acute hypoxic respiratory failure secondary to Right sided pneumonia and bilateral pulmonary embolism -- white count elevated, chest x-ray shows left sided infiltrate -- continue IV cefepime -- white count trending down. Continue incentive spirometer. -- Blood culture negative WBC trending down. No fever.  --  Metastatic squamous cell carcinoma lung (HCC) --Outpatient follow-up with hematology/oncology-- Dr. Cathie Hoops --Home afatinib ok to resume per oncology Dr Bethanne Ginger recommendation (secure chat) --pt has h/o brain radiation   Essential hypertension --Losartan  on hold --bp stable -- avoid hypotension  Chronic pain --Tramadol prn    OSA on CPAP --CPAP nightly ordered  H/o GERD, Esophageal varices --PPI  IV daily  Generalized weakness with debility -- PT OT input appreciated.  Nutrition Status: Nutrition Problem: Moderate Malnutrition Etiology: chronic illness (cancer and cancer related treatments) Signs/Symptoms: mild fat depletion, mild muscle depletion Interventions: Ensure Enlive (each supplement provides 350kcal and 20 grams of protein), MVI, Liberalize Diet Follow RD recs  Overall long term poor prognosis. Will benefit for Palliative care to discuss GOC I have discussed  with Dr. Cathie Hoops who will see patient today. Josh Borders from palliative care will see patient as well. Attempted to discuss code status with patient's wife was at bedside. She is awaiting for patient's children to come/discuss with them. Patient remains a full code for now.  Procedures:Mechanical thrombectomy for PE Family communication : wife at bedside Consults : vascular, Nephorlogy, oncology CODE STATUS: full DVT Prophylaxis : eliquis Level of care: Progressive Status is: Inpatient Remains inpatient appropriate because: current management for bilateral PE, pneumonia, DVT, CVA   TOTAL TIME TAKING CARE OF THIS PATIENT: 45 minutes.  >50% time spent on counselling and coordination of care  Note: This dictation was prepared with Dragon dictation along with smaller phrase technology. Any transcriptional errors that result from this process are unintentional.  Enedina Finner M.D    [facility]   CC: Primary care physician; Lauro Regulus, MD

## 2022-11-01 DIAGNOSIS — E440 Moderate protein-calorie malnutrition: Secondary | ICD-10-CM | POA: Diagnosis not present

## 2022-11-01 DIAGNOSIS — C3492 Malignant neoplasm of unspecified part of left bronchus or lung: Secondary | ICD-10-CM | POA: Diagnosis not present

## 2022-11-01 DIAGNOSIS — I639 Cerebral infarction, unspecified: Secondary | ICD-10-CM | POA: Diagnosis not present

## 2022-11-01 DIAGNOSIS — I2699 Other pulmonary embolism without acute cor pulmonale: Secondary | ICD-10-CM | POA: Diagnosis not present

## 2022-11-01 DIAGNOSIS — C7931 Secondary malignant neoplasm of brain: Secondary | ICD-10-CM | POA: Diagnosis not present

## 2022-11-01 DIAGNOSIS — J9601 Acute respiratory failure with hypoxia: Secondary | ICD-10-CM | POA: Diagnosis not present

## 2022-11-01 DIAGNOSIS — Z7189 Other specified counseling: Secondary | ICD-10-CM

## 2022-11-01 LAB — CBC
HCT: 25.3 % — ABNORMAL LOW (ref 39.0–52.0)
Hemoglobin: 8 g/dL — ABNORMAL LOW (ref 13.0–17.0)
MCH: 28.4 pg (ref 26.0–34.0)
MCHC: 31.6 g/dL (ref 30.0–36.0)
MCV: 89.7 fL (ref 80.0–100.0)
Platelets: 355 10*3/uL (ref 150–400)
RBC: 2.82 MIL/uL — ABNORMAL LOW (ref 4.22–5.81)
RDW: 14 % (ref 11.5–15.5)
WBC: 12 10*3/uL — ABNORMAL HIGH (ref 4.0–10.5)
nRBC: 0 % (ref 0.0–0.2)

## 2022-11-01 LAB — GLUCOSE, CAPILLARY
Glucose-Capillary: 129 mg/dL — ABNORMAL HIGH (ref 70–99)
Glucose-Capillary: 130 mg/dL — ABNORMAL HIGH (ref 70–99)
Glucose-Capillary: 130 mg/dL — ABNORMAL HIGH (ref 70–99)
Glucose-Capillary: 140 mg/dL — ABNORMAL HIGH (ref 70–99)

## 2022-11-01 LAB — APTT
aPTT: 70 s — ABNORMAL HIGH (ref 24–36)
aPTT: 61 s — ABNORMAL HIGH (ref 24–36)
aPTT: 75 s — ABNORMAL HIGH (ref 24–36)

## 2022-11-01 LAB — HEPARIN LEVEL (UNFRACTIONATED): Heparin Unfractionated: 1.06 [IU]/mL — ABNORMAL HIGH (ref 0.30–0.70)

## 2022-11-01 NOTE — Progress Notes (Signed)
[facility]  - [place] at [facility]   PATIENT NAME: Wesley Young    MR#:  [id number]  DATE OF BIRTH:  08/09/1940  SUBJECTIVE:  Wife  at bedside.  Patient opens eyes to verbal command. Able to squeeze my hand strength more better on the left than right. Breathing somewhat improved today. Unable to keep his eyes open. Sats stable on current oxygen. Wife had question about nutrition. Told her I will have speech therapy see patient VITALS:  Blood pressure 103/60, pulse 82, temperature 99.4 F (37.4 C), temperature source Oral, resp. rate (!) 30, height 6' (1.829 m), weight 78.3 kg, SpO2 96%.  PHYSICAL EXAMINATION:  appears weak GENERAL:  82 y.o.-year-old patient with acute distress. ] lethargic LUNGS: decreased breath sounds bilaterally. Breathing through the mouth. CARDIOVASCULAR: S1, S2 normal. No murmur, mild tachycardia ABDOMEN: Soft, nontender, nondistended.  EXTREMITIES: No  edema b/l.    NEUROLOGIC: unable  to assess completely. Quite lethargic. Was able to squeeze my fingers momentarily. Unable to hold meaningful conversation Pressure Injury 10/30/22 Sacrum Right;Left;Medial Stage 1 -  Intact skin with non-blanchable redness of a localized area usually over a bony prominence. (Active)  10/30/22 0900  Location: Sacrum  Location Orientation: Right;Left;Medial  Staging: Stage 1 -  Intact skin with non-blanchable redness of a localized area usually over a bony prominence.  Wound Description (Comments):   Present on Admission:       LABORATORY PANEL:  CBC Recent Labs  Lab 11/01/22 0347  WBC 12.0*  HGB 8.0*  HCT 25.3*  PLT 355    Chemistries  Recent Labs  Lab 10/30/22 0844  NA 139  K 3.9  CL 109  CO2 21*  GLUCOSE 122*  BUN 24*  CREATININE 0.56*  CALCIUM 9.0   Cardiac Enzymes No results for input(s): "TROPONINI" in the last 168 hours. RADIOLOGY:  DG Chest Port 1 View  Result Date: 10/31/2022 CLINICAL DATA:  Cough, pulmonary emboli EXAM:  PORTABLE CHEST 1 VIEW COMPARISON:  Chest CT and chest radiograph 10-29-22 FINDINGS: The right chest wall port is stable with the tip terminating in the mid SVC. The cardiomediastinal silhouette is grossly stable. Extensive airspace opacity throughout the left lung with an associated small effusion are not significantly changed. There is no significant airspace disease on the right. There is no right effusion. There is no pneumothorax There is no acute osseous abnormality. IMPRESSION: Unchanged extensive airspace opacity throughout the left lung with a small left pleural effusion. Electronically Signed   By: Lesia Hausen M.D.   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 Some acute infarcts are also present in the watershed distribution bilaterally. Findings are concerning for a central embolic source. --NPO --neurology consultation with Dr. Otelia Limes case discussed. -- Speech therapy to see patient -- hold PO meds. Change eliquis to IV heparin drip. Neurology is okay with it. --IV fluids -- neurology signed off. -- Patient is quite lethargic -- will have speech therapy evaluated to see if patient is able to participate in swallow eval. May need to discuss with wife regarding feeding options depending on swallow  eval. -- Appreciate dietitian input.  History of PAF, PAC, PVC, dysrhythmia noted in cardiology Dr. Darrold Junker note 06/2022 -- patient has had Holter monitor and 2022 did not show evidence of a fib. -- Not on any oral anticoagulation at home. -- EKG on  admission showed sinus rhythm with nonspecific STT changes -- remains on telemetry heartrate 70s to 90s  Bilateral pulmonary embolism (HCC) Right LE DVT (posterior Tibial vein) --Heparin gtt --Echo showed EF 60-65% , mild LVH (grade 1 DD) --Vascular consultation with Dr. Wyn Quaker. Patient is status post Mechanical thrombectomy using the penumbra CAT 8 device to the right lower lobe, middle lobe, and upper lobe pulmonary arteries in the left lower lobe pulmonary artery  -- try to wean oxygen down as able to. Will continue heparin drip for today and transition to oral eliquis tomorrow -- vascular okay to transition to eliquis. --9/8--change to IV heparin gtt  Acute hypoxic respiratory failure secondary to Right sided pneumonia and bilateral pulmonary embolism -- white count elevated, chest x-ray shows left sided infiltrate -- continue IV cefepime -- white count trending down. Continue incentive spirometer. -- Blood culture negative WBC trending down. No fever.  --  Metastatic squamous cell carcinoma lung (HCC) --Outpatient follow-up with hematology/oncology-- Dr. Cathie Hoops --Home afatinib ok to resume per oncology Dr Bethanne Ginger recommendation (secure chat)--now on hold due to AMS --pt has h/o brain radiation   Essential hypertension --Losartan  on hold --bp stable -- avoid hypotension  Chronic pain --Tramadol prn    OSA on CPAP --CPAP nightly ordered  H/o GERD, Esophageal varices --PPI IV daily  Generalized weakness with debility -- PT OT input appreciated.  Nutrition Status: Nutrition Problem: Moderate Malnutrition Etiology: chronic illness (cancer and cancer related treatments) Signs/Symptoms: mild fat depletion, mild muscle depletion Interventions: Ensure Enlive (each supplement provides 350kcal and 20 grams of protein), MVI, Liberalize Diet Follow RD recs  Overall long term poor prognosis.  Palliative care Josh water met with patient, wife and five children. Patient is DNR DNI. They  want to continue current scope of treatment. If patient is not show any signs of improvement in couple days then will need to readdress nutrition, further management regarding medical condition.  Procedures:Mechanical thrombectomy for PE Family communication : wife at bedside Consults : vascular, Nephorlogy, oncology CODE STATUS: DNR/DNI DVT Prophylaxis : heparin gtt Level of care: Progressive Status is: Inpatient Remains inpatient appropriate because: current management for bilateral PE, pneumonia, DVT, CVA   TOTAL TIME TAKING CARE OF THIS PATIENT: 35 minutes.  >50% time spent on counselling and coordination of care  Note: This dictation was prepared with Dragon dictation along with smaller phrase technology. Any transcriptional errors that result from this process are unintentional.  Enedina Finner M.D    [facility]   CC: Primary care physician; Lauro Regulus, MD

## 2022-11-01 NOTE — Consult Note (Signed)
ANTICOAGULATION CONSULT NOTE Pharmacy Consult for Heparin Indication: Pulmonary Embolism s/p thrombectomy with acute CVA  Allergies  Allergen Reactions   Ativan [Lorazepam]    Benadryl [Diphenhydramine]     Withdraw symptoms per patient   Crestor [Rosuvastatin Calcium]    Nsaids    Rapaflo [Silodosin]    Statins Other (See Comments)   Zetia [Ezetimibe]    Latex Rash    Bandaids only   Neosporin [Neomycin-Bacitracin Zn-Polymyx] Rash    Patient Measurements: Height: 6' (182.9 cm) Weight: 78.3 kg (172 lb 9.9 oz) IBW/kg (Calculated) : 77.6 Heparin Dosing Weight: 78.3  Vital Signs: Temp: 99.5 F (37.5 C) (09/10 2100) Temp Source: Axillary (09/10 2100) BP: 117/69 (09/10 2000) Pulse Rate: 72 (09/10 2000)  Labs: Recent Labs    10/30/22 0844 10/30/22 1237 10/30/22 1237 10/30/22 2151 10/31/22 0445 10/31/22 0616 10/31/22 1628 11/01/22 0347 11/01/22 1221 11/01/22 2216  HGB  --  9.4*   < >  --  8.1*  --   --  8.0*  --   --   HCT  --  30.1*  --   --  26.3*  --   --  25.3*  --   --   PLT  --  363  --   --  354  --   --  355  --   --   APTT  --  37*   < > 55*  --  60*   < > 70* 61* 75*  HEPARINUNFRC  --  >1.10*   < > >1.10*  --  >1.10*  --  1.06*  --   --   CREATININE 0.56*  --   --   --   --   --   --   --   --   --    < > = values in this interval not displayed.    Estimated Creatinine Clearance: 75.4 mL/min (A) (by C-G formula based on SCr of 0.56 mg/dL (L)).   Medical History: Past Medical History:  Diagnosis Date   Abnormal PSA    s/p post prostate biopsy in the past which was negative   Actinic keratosis 03/14/2007   L ant lat neck at base of neck - bx proven    Actinic keratosis 01/08/2014   R calf - bx proven    Alcoholism (HCC)    with prolonged hospitalization 2009 for withdrawal c/b aspiration pneumonia requiring vent   Anxiety    Atrial fibrillation and flutter (HCC)    pt denies afib.   B12 deficiency    Basal cell carcinoma 03/14/2007   R distal  lat tricep near elbow    Basal cell carcinoma 03/06/2014   R calf - excision 04/22/2014   Basal cell carcinoma 08/30/2021   suprasternal area, treated with EDC   Cardiomyopathy (HCC)    mild probably multifactorial secondary to HTn and possible alcohol contribution. 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 ECHO 60-65%, s/p thrombectomy to the R lower lobe, middle lobe and upper lobe. 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   Patient was previously on heparin this admission, last rate was 1850 units/hr  Date/Time aPTT HL  Comments 9/8@2151  55 >1.10  Subtherapeutic @950un ->1100un 9/9@0616  60 >1.10  Subtherapeutic @ 1100un->1250 un 9/9@1628  65   Subtherapeutic @ 1250un->1300 un 9/10@0347      70   1.06               Therapeutic x1 at 1300 un/hr 9/10@1245  61   Subtherapeutic at 1300  un/hr->1350un/hr 9/10@2216      75                                Therapeutic X 1   Goal of Therapy:  Heparin level 0.3-0.5 units/ml aPTT 66-85 seconds Monitor platelets by anticoagulation protocol: Yes  Hgb 8.1, plt WNL. No issues with infusion reported, no signs/symptoms of bleeding noted.   Plan:  NO BOLUS 9/10:  aPTT @ 2216 = 75, therapeutic X 1 - Will continue pt on current rate and recheck aPTT and HL in 8 hrs on 9/11 @ 0600. - Will use aPTT to guide dosing until correlating with HL  Continue to monitor daily CBC.  Wesley Young D\ 11/01/2022 11:16 PM

## 2022-11-01 NOTE — Progress Notes (Signed)
Occupational Therapy Treatment Patient Details Name: Wesley Young MRN: [id number] DOB: [birth year] Today's Date: 11/01/2022   History of present illness Pt is an 82 year old male presenting tot he ED with chief concerns of weakness, spo2 70% on RA; admitted with Bilateral pulmonary embolism, right LE DVT, acute hypoxic respiratory failure; pt is s/p mechanical thrombectomy 9/4; 9/8 MRI now shows Pt is an 82 year old male presenting tot he ED with chief concerns of weakness, spo2 70% on RA; admitted with Bilateral pulmonary embolism, right LE DVT, acute hypoxic respiratory failure; pt is s/p mechanical thrombectomy 9/4; 9/8 MRI now shows watershed strokes     PMH significant for hypertension, BPH, 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 Goals will remain at this time however will be downgraded based on future performance as per chart, family is monitoring progression for future GOC conversation. Pt requires multi modal cues for eye opening, sustained attention with inconsistent one step direction following. Poor tolerance for bed mobility with pt requiring MAX-TOTAL A. TOTAL A required for grooming. Pt is left as received, all needs met, vitals stable throughout. OT will follow acutely.       If plan is discharge home, recommend the following:  A lot of help with walking and/or transfers;A lot of help with bathing/dressing/bathroom;Direct supervision/assist for financial management;Assistance with cooking/housework;Assistance with feeding;Help with stairs or ramp for entrance;Direct supervision/assist for medications management   Equipment Recommendations  Hospital bed;Wheelchair cushion (measurements OT);Wheelchair (measurements OT);Hoyer lift    Recommendations for [employer]      Precautions /  Restrictions Precautions Precautions: Fall Restrictions Weight Bearing Restrictions: No       Mobility Bed Mobility               General bed mobility comments: semi supine at approx 45 degrees to long sit with MAX A, pt unable to [employer] transfer comment: pt unable to tolerate on this date     Balance Overall balance assessment: Needs assistance Sitting-balance support: Bilateral upper extremity supported, Single extremity supported, Feet supported Sitting balance-Leahy Scale: Zero                                     ADL either performed or assessed with clinical judgement   ADL Overall ADL's : Needs assistance/impaired     Grooming: TOTAL assistance;Sitting                       Toileting- Clothing Manipulation and Hygiene: Total assistance         General ADL Comments: poor tolerance, anticipate MAX-TOTAL A for all ADLs on this date    Extremity/Trunk Assessment Upper Extremity Assessment Upper Extremity Assessment: Difficult to assess due to impaired cognition   Lower Extremity Assessment Lower Extremity Assessment: Difficult to assess due to impaired cognition        Vision   Additional Comments: will continue to assess   Perception     Praxis      Cognition Arousal: Obtunded Behavior During Therapy: Flat affect Overall Cognitive Status: Impaired/Different from baseline Area  of Impairment: Orientation, Attention, Memory, Following commands, Safety/judgement, Awareness, Problem solving                               General Comments: step by step multi modal cues for simple one step direction following with inconsistent one step direction following.        Exercises      Shoulder Instructions       General Comments Pt on 8L via HFNC with spo2 >90% throughout, HR in the 80s bpm throughout;    Pertinent Vitals/ Pain       Pain  Assessment Pain Assessment: PAINAD Breathing: occasional labored breathing, short period of hyperventilation Negative Vocalization: occasional moan/groan, low speech, negative/disapproving quality Facial Expression: sad, frightened, frown Body Language: relaxed Consolability: distracted or reassured by voice/touch PAINAD Score: 4 Pain Intervention(s): Monitored during session, Repositioned  Home Living                                          Prior Functioning/Environment              Frequency  Min 1X/week        Progress Toward Goals  OT Goals(current goals can now be found in the care plan section)  Progress towards OT goals: Not progressing toward goals - comment (will update goals as approrpiate pending progress next tx session)     Plan      Co-evaluation    PT/OT/SLP Co-Evaluation/Treatment: Yes Reason for Co-Treatment: Complexity of the patient's impairments (multi-system involvement);Necessary to address cognition/behavior during functional activity;For patient/therapist safety;To address functional/ADL transfers          AM-PAC OT "6 Clicks" Daily Activity     Outcome Measure   Help from another person eating meals?: Total Help from another person taking care of personal grooming?: Total Help from another person toileting, which includes using toliet, bedpan, or urinal?: Total Help from another person bathing (including washing, rinsing, drying)?: A Lot Help from another person to put on and taking off regular upper body clothing?: A Lot Help from another person to put on and taking off regular lower body clothing?: Total 6 Click Score: 8    End of Session Equipment Utilized During Treatment: Oxygen  OT Visit Diagnosis: Muscle weakness (generalized) (M62.81)   Activity Tolerance Patient limited by lethargy   Patient Left in bed;with call bell/phone within reach;with bed alarm set;with family/visitor present   Nurse  Communication Mobility status        Time: 4098-1191 OT Time Calculation (min): 14 min  Charges: OT General Charges $OT Visit: 1 Visit  Oleta Mouse, OTD OTR/L  11/01/22, 2:30 PM

## 2022-11-01 NOTE — Progress Notes (Signed)
Nutrition Follow-up  DOCUMENTATION CODES:   Non-severe (moderate) malnutrition in context of chronic illness  INTERVENTION:   -If aggressive care is warranted, consider initiation of nutrition support:   Initiate Osmolite 1.5 @ 20 ml/hr and increase by 10 ml every 12 hours to goal rate of 60 ml/hr.   60 ml Prosource TF daily  30 ml free water flush every 4 hours to maintain tube patency  Tube feeding regimen provides 2240 kcal (100% of needs), 110 grams of protein, and 1097 ml of H2O. Total free water: 1277 ml daily    -If feedings are started, monitor Mg, K, and Phos and replete as needed. Also add 100 mg thiamine daily x 5 days secondary to high refeeding risk  NUTRITION DIAGNOSIS:   Moderate Malnutrition related to chronic illness (cancer and cancer related treatments) as evidenced by mild fat depletion, mild muscle depletion.  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 He did not respond top voice. No family present.  Per neurology notes, "Multiple bilateral acute cardioembolic strokes with additional watershed components in the ACA/MCA distributions bilaterally. Cardioembolic strokes are most likely secondary to hypercoagulability".   Pt has been lethargic since 10/29/22 PM. He is able to open eyes, but unsafe for PO intake at this time. Pt is currently NPO.   Oncology and palliative care following. Pt iwht poor prognosis. Pt is DNR/DNI. Pt family considering PEG. Plan  for another  family meeting and allow time for options.   Medications reviewed and include dextrose 5% and 0.45% NaCl infusion @ 75 ml/hr.   Labs reviewed: CBGS: 129-178.    Diet Order:   Diet Order             Diet NPO time specified  Diet effective now                   EDUCATION NEEDS:   Education needs have been addressed  Skin:  Skin Assessment: Reviewed RN Assessment  Last BM:  11/15/2022 (type 6)  Height:   Ht Readings from Last 1 Encounters:  11/10/2022 6' (1.829 m)    Weight:   Wt Readings from Last 1 Encounters:  11/20/2022 78.3 kg    Ideal Body Weight:  80.9 kg  BMI:  Body mass index is 23.41 kg/m.  Estimated Nutritional Needs:   Kcal:  2000-2200  Protein:  105-120 grams  Fluid:  > 2 L    Levada Schilling, RD, LDN, CDCES Registered Dietitian II Certified Diabetes Care and Education Specialist Please refer to [facility] for RD and/or RD on-call/weekend/after hours pager

## 2022-11-01 NOTE — TOC Progression Note (Signed)
Transition of Care [facility]) - Progression Note    Patient Details  Name: Wesley Young MRN: [id number] Date of Birth: 1940-03-22  Transition of [facility]) CM/SW Contact  Darolyn Rua, [place] Phone Number: 11/01/2022, 10:16 AM  Clinical Narrative:     9/10: Palliative following closely for disposition, family hopeful of medical improvements in next few days. TOC will continue to follow for discharge planning and appropriate disposition.   9/6: Reviewed bed offers with patient and his wife and they have chosen [facility]. Wife will bring oral chemo medication from home.   Expected Discharge Plan: Skilled Nursing Facility Barriers to Discharge: Continued Medical Work up  Expected Discharge Plan and Services     Post Acute Care Choice: Skilled Nursing Facility Living arrangements for the past 2 months: Single Family Home                                       Social Determinants of Health (SDOH) Interventions SDOH Screenings   Food Insecurity: No Food Insecurity (10/23/2022)  Housing: Low Risk  (11/04/2022)  Transportation Needs: No Transportation Needs (11/08/2022)  Utilities: Not At Risk (11/01/2022)  Financial Resource Strain: Low Risk  (08/10/2022)   Received from [facility]  Tobacco Use: Medium Risk (11/12/2022)    Readmission Risk Interventions     No data to display

## 2022-11-01 NOTE — Progress Notes (Addendum)
Physical Therapy Re-Evaluation Patient Details Name: Wesley Young MRN: [id number] DOB: Mar 22, 1940 Today's Date: 11/01/2022   History of Present Illness Pt is an 82 year old male presenting tot he ED with chief concerns of weakness, spo2 70% on RA; admitted with Bilateral pulmonary embolism, right LE DVT, acute hypoxic respiratory failure; pt is s/p mechanical thrombectomy 9/4; 9/8 MRI now shows Pt is an 82 year old male presenting tot he ED with chief concerns of weakness, spo2 70% on RA; admitted with Bilateral pulmonary embolism, right LE DVT, acute hypoxic respiratory failure; pt is s/p mechanical thrombectomy 9/4; 9/8 MRI now shows watershed strokes     PMH significant for hypertension, BPH, 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 Goals updated as appropriate. Pt presents laying in bed with wife in room, very lethargic and needing constant stimulation to remain awake. PT/OT/Wife provide maximal multimodal cueing throughout session to maintain pt arousal. He required max-totalAx2 for rolling to R side, PT guided pt hand to rail and he was able to pull with LUE.  Pt overall fluctuating with simple 1 step command following throughout session. He would benefit from continued skilled care to maximize functional abilities.    If plan is discharge home, recommend the following: Assistance with cooking/housework;Direct supervision/assist for medications management;Assist for transportation;Help with stairs or ramp for entrance;Two people to help with bathing/dressing/bathroom;Two people to help with walking and/or transfers;Assistance with feeding;Direct supervision/assist for financial management;Supervision due to cognitive status   Can travel by private vehicle     No  Equipment Recommendations  Other (comment) (TBD  at next venue)    Recommendations for Other Services       Precautions / Restrictions Precautions Precautions: Fall Restrictions Weight Bearing Restrictions: No     Mobility  Bed Mobility Overal bed mobility: Needs Assistance Bed Mobility: Rolling Rolling: Max assist, Used rails, Total assist, +2 for safety/equipment         General bed mobility comments: maxA for rolling to R side for repositioning, PT guided hand to rail and pt able to pull with LUE.    Transfers                   General transfer comment: pt unable to tolerate at this time    Ambulation/Gait                   Stairs             Wheelchair Mobility     Tilt Bed    Modified Rankin (Stroke Patients Only)       Balance Overall balance assessment: Needs assistance Sitting-balance support: Bilateral upper extremity supported, Single extremity supported, Feet supported Sitting balance-Leahy Scale: Zero                                      Cognition Arousal: Obtunded Behavior During Therapy: Flat affect Overall Cognitive Status: Impaired/Different from baseline                                          Exercises      General Comments General comments (skin  integrity, edema, etc.): Pt on 8L via HFNC with spo2 >90% throughout, HR in the 80s bpm throughout      Pertinent Vitals/Pain Pain Assessment Pain Assessment: Faces Faces Pain Scale: Hurts a little bit Pain Location: unable to voice Pain Intervention(s): Monitored during session, Repositioned    Home Living                          Prior Function            PT Goals (current goals can now be found in the care plan section) Acute Rehab PT Goals Patient Stated Goal: return home PT Goal Formulation: Patient unable to participate in goal setting Time For Goal Achievement: 11/15/22 Potential to Achieve Goals: Fair Progress towards PT goals: Progressing toward  goals    Frequency    Min 1X/week      PT Plan      Co-evaluation PT/OT/SLP Co-Evaluation/Treatment: Yes Reason for Co-Treatment: Complexity of the patient's impairments (multi-system involvement);Necessary to address cognition/behavior during functional activity;For patient/therapist safety;To address functional/ADL transfers PT goals addressed during session: Mobility/safety with mobility OT goals addressed during session: ADL's and self-care      AM-PAC PT "6 Clicks" Mobility   Outcome Measure  Help needed turning from your back to your side while in a flat bed without using bedrails?: Total Help needed moving from lying on your back to sitting on the side of a flat bed without using bedrails?: Total Help needed moving to and from a bed to a chair (including a wheelchair)?: Total Help needed standing up from a chair using your arms (e.g., wheelchair or bedside chair)?: Total Help needed to walk in hospital room?: Total Help needed climbing 3-5 steps with a railing? : Total 6 Click Score: 6    End of Session Equipment Utilized During Treatment: Oxygen Activity Tolerance: Patient limited by lethargy Patient left: in bed;with call bell/phone within reach;with bed alarm set;with family/visitor present   PT Visit Diagnosis: Other abnormalities of gait and mobility (R26.89);Muscle weakness (generalized) (M62.81);Difficulty in walking, not elsewhere classified (R26.2)     Time: 0152-0211 PT Time Calculation (min) (ACUTE ONLY): 19 min  Charges:    $Therapeutic Activity: 8-22 mins PT General Charges $$ ACUTE PT VISIT: 1 Visit                    Schneider Warchol, PT, SPT 3:52 PM,11/01/22

## 2022-11-01 NOTE — Consult Note (Signed)
ANTICOAGULATION CONSULT NOTE Pharmacy Consult for Heparin Indication: Pulmonary Embolism s/p thrombectomy with acute CVA  Allergies  Allergen Reactions   Ativan [Lorazepam]    Benadryl [Diphenhydramine]     Withdraw symptoms per patient   Crestor [Rosuvastatin Calcium]    Nsaids    Rapaflo [Silodosin]    Statins Other (See Comments)   Zetia [Ezetimibe]    Latex Rash    Bandaids only   Neosporin [Neomycin-Bacitracin Zn-Polymyx] Rash    Patient Measurements: Height: 6' (182.9 cm) Weight: 78.3 kg (172 lb 9.9 oz) IBW/kg (Calculated) : 77.6 Heparin Dosing Weight: 78.3  Vital Signs: Temp: 99.4 F (37.4 C) (09/10 1133) Temp Source: Oral (09/10 1133) BP: 103/60 (09/10 1200) Pulse Rate: 82 (09/10 1133)  Labs: Recent Labs    10/30/22 0844 10/30/22 1237 10/30/22 1237 10/30/22 2151 10/31/22 0445 10/31/22 0616 10/31/22 1628 11/01/22 0347 11/01/22 1221  HGB  --  9.4*   < >  --  8.1*  --   --  8.0*  --   HCT  --  30.1*  --   --  26.3*  --   --  25.3*  --   PLT  --  363  --   --  354  --   --  355  --   APTT  --  37*   < > 55*  --  60* 65* 70* 61*  HEPARINUNFRC  --  >1.10*   < > >1.10*  --  >1.10*  --  1.06*  --   CREATININE 0.56*  --   --   --   --   --   --   --   --    < > = values in this interval not displayed.    Estimated Creatinine Clearance: 75.4 mL/min (A) (by C-G formula based on SCr of 0.56 mg/dL (L)).   Medical History: Past Medical History:  Diagnosis Date   Abnormal PSA    s/p post prostate biopsy in the past which was negative   Actinic keratosis 03/14/2007   L ant lat neck at base of neck - bx proven    Actinic keratosis 01/08/2014   R calf - bx proven    Alcoholism (HCC)    with prolonged hospitalization 2009 for withdrawal c/b aspiration pneumonia requiring vent   Anxiety    Atrial fibrillation and flutter (HCC)    pt denies afib.   B12 deficiency    Basal cell carcinoma 03/14/2007   R distal lat tricep near elbow    Basal cell carcinoma  03/06/2014   R calf - excision 04/22/2014   Basal cell carcinoma 08/30/2021   suprasternal area, treated with EDC   Cardiomyopathy (HCC)    mild probably multifactorial secondary to HTn and possible alcohol contribution. Left ventricular ejection fraction app 45 %   CHF (congestive heart failure) (HCC)    mild left ventricular systolic dysfunction   Dental bridge present    "[place]" bridge, top - right   Duodenitis    Dysplastic nevus 01/08/2014   L prox ant thigh - mild    Dyspnea    on exertion   Dyspnea on exertion    Dysrhythmia    Erosive esophagitis    Esophageal varices (HCC)    Fibrosarcoma (HCC)    Gross hematuria    High cholesterol    Hx of melanoma in situ 01/20/2015   L upper back paraspinal - excision    Hx of squamous cell carcinoma of skin 2014  multiple sites   Hypertension    Impotence    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HCC)    Sleep apnea    Spinal stenosis    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   L forearm - excision    Squamous cell carcinoma of skin 12/16/2015   R dorsum hand    Squamous cell carcinoma of skin 10/02/2017   R dorsum hand    Varicose veins of both lower extremities     Medications:  Apixaban 10 mg BID - last dose 9/7@2310   Assessment: Wesley Young is a 82 yo male who presented to the ED with weakness, SpO2 70% on RA. ECHO 60-65%, s/p thrombectomy to the R lower lobe, middle lobe and upper lobe. 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   Patient was previously on heparin this admission, last rate was 1850 units/hr  Date/Time aPTT HL  Comments 9/8@2151  55 >1.10  Subtherapeutic @950un ->1100un 9/9@0616  60 >1.10  Subtherapeutic @ 1100un->1250 un 9/9@1628  65   Subtherapeutic @ 1250un->1300 un 9/10@0347      70   1.06               Therapeutic x1 at 1300 un/hr 9/10@1245  61   Subtherapeutic at 1300 un/hr->1350un/hr  Goal of Therapy:  Heparin level 0.3-0.5  units/ml aPTT 66-85 seconds Monitor platelets by anticoagulation protocol: Yes  Hgb 8.1, plt WNL. No issues with infusion reported, no signs/symptoms of bleeding noted.   Plan:  NO BOLUS aPTT down after therapeutic x1. Will increase heparin infusion rate from 1300un to 1350 un/hr and repeat aPTT 8hrs after rate change HL with AM aPTT to determine if correlating Continue to monitor daily CBC.  Aylana Hirschfeld Rodriguez-Guzman PharmD, BCPS 11/01/2022 1:52 PM

## 2022-11-01 NOTE — Plan of Care (Signed)

## 2022-11-01 NOTE — Consult Note (Signed)
ANTICOAGULATION CONSULT NOTE Pharmacy Consult for Heparin Indication: Pulmonary Embolism s/p thrombectomy with acute CVA  Allergies  Allergen Reactions   Ativan [Lorazepam]    Benadryl [Diphenhydramine]     Withdraw symptoms per patient   Crestor [Rosuvastatin Calcium]    Nsaids    Rapaflo [Silodosin]    Statins Other (See Comments)   Zetia [Ezetimibe]    Latex Rash    Bandaids only   Neosporin [Neomycin-Bacitracin Zn-Polymyx] Rash    Patient Measurements: Height: 6' (182.9 cm) Weight: 78.3 kg (172 lb 9.9 oz) IBW/kg (Calculated) : 77.6 Heparin Dosing Weight: 78.3  Vital Signs: Temp: 100 F (37.8 C) (09/10 0300) Temp Source: Oral (09/09 1900) BP: 108/64 (09/09 1700) Pulse Rate: 90 (09/09 1700)  Labs: Recent Labs    10/30/22 0844 10/30/22 1237 10/30/22 1237 10/30/22 2151 10/31/22 0445 10/31/22 0616 10/31/22 1628 11/01/22 0347  HGB  --  9.4*   < >  --  8.1*  --   --  8.0*  HCT  --  30.1*  --   --  26.3*  --   --  25.3*  PLT  --  363  --   --  354  --   --  355  APTT  --  37*   < > 55*  --  60* 65* 70*  HEPARINUNFRC  --  >1.10*   < > >1.10*  --  >1.10*  --  1.06*  CREATININE 0.56*  --   --   --   --   --   --   --    < > = values in this interval not displayed.    Estimated Creatinine Clearance: 75.4 mL/min (A) (by C-G formula based on SCr of 0.56 mg/dL (L)).   Medical History: Past Medical History:  Diagnosis Date   Abnormal PSA    s/p post prostate biopsy in the past which was negative   Actinic keratosis 03/14/2007   L ant lat neck at base of neck - bx proven    Actinic keratosis 01/08/2014   R calf - bx proven    Alcoholism (HCC)    with prolonged hospitalization 2009 for withdrawal c/b aspiration pneumonia requiring vent   Anxiety    Atrial fibrillation and flutter (HCC)    pt denies afib.   B12 deficiency    Basal cell carcinoma 03/14/2007   R distal lat tricep near elbow    Basal cell carcinoma 03/06/2014   R calf - excision 04/22/2014    Basal cell carcinoma 08/30/2021   suprasternal area, treated with EDC   Cardiomyopathy (HCC)    mild probably multifactorial secondary to HTn and possible alcohol contribution. Left ventricular ejection fraction app 45 %   CHF (congestive heart failure) (HCC)    mild left ventricular systolic dysfunction   Dental bridge present    "[place]" bridge, top - right   Duodenitis    Dysplastic nevus 01/08/2014   L prox ant thigh - mild    Dyspnea    on exertion   Dyspnea on exertion    Dysrhythmia    Erosive esophagitis    Esophageal varices (HCC)    Fibrosarcoma (HCC)    Gross hematuria    High cholesterol    Hx of melanoma in situ 01/20/2015   L upper back paraspinal - excision    Hx of squamous cell carcinoma of skin 2014   multiple sites   Hypertension    Impotence    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HCC)  Sleep apnea    Spinal stenosis    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   L forearm - excision    Squamous cell carcinoma of skin 12/16/2015   R dorsum hand    Squamous cell carcinoma of skin 10/02/2017   R dorsum hand    Varicose veins of both lower extremities     Medications:  Apixaban 10 mg BID - last dose 9/7@2310   Assessment: Wesley Young is a 82 yo male who presented to the ED with weakness, SpO2 70% on RA. ECHO 60-65%, s/p thrombectomy to the R lower lobe, middle lobe and upper lobe. 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   Patient was previously on heparin this admission, last rate was 1850 units/hr  Date/Time aPTT HL  Comments 9/8@2151  55 >1.10  Subtherapeutic @ 950un -->1100un 9/9@0616  60 >1.10  Subtherapeutic @ 1100un --> 1250 un 9/9@1628  65   Subtherapeutic @ 1250un --> 1300 un 9/10@0347      66        1.06                Therapeutic X 1   Goal of Therapy:  Heparin level 0.3-0.5 units/ml aPTT 66-85 seconds Monitor platelets by anticoagulation protocol: Yes  Hgb 8.1, plt WNL. No  issues with infusion reported, no signs/symptoms of bleeding noted.   Plan:  9/10 @ 0347:  aPTT = 70,   HL = 1.06 - aPTT therapeutic X 1,  HL still elevated from Eliquis PTA - Will continue pt on current rate and recheck aPTT in 8 hrs @ 1200. - Will recheck HL on 9/11 with AM labs Continue to monitor daily CBC.  Thank you for involving pharmacy in this patient's care.   Noami Bove D Clinical Pharmacist 11/01/2022 4:45 AM

## 2022-11-02 ENCOUNTER — Other Ambulatory Visit (HOSPITAL_COMMUNITY): Payer: Self-pay

## 2022-11-02 DIAGNOSIS — I2699 Other pulmonary embolism without acute cor pulmonale: Secondary | ICD-10-CM | POA: Diagnosis not present

## 2022-11-02 LAB — CBC WITH DIFFERENTIAL/PLATELET
Abs Immature Granulocytes: 0.12 10*3/uL — ABNORMAL HIGH (ref 0.00–0.07)
Basophils Absolute: 0 10*3/uL (ref 0.0–0.1)
Basophils Relative: 0 %
Eosinophils Absolute: 0.1 10*3/uL (ref 0.0–0.5)
Eosinophils Relative: 1 %
HCT: 29 % — ABNORMAL LOW (ref 39.0–52.0)
Hemoglobin: 8.8 g/dL — ABNORMAL LOW (ref 13.0–17.0)
Immature Granulocytes: 1 %
Lymphocytes Relative: 1 %
Lymphs Abs: 0.2 10*3/uL — ABNORMAL LOW (ref 0.7–4.0)
MCH: 28.1 pg (ref 26.0–34.0)
MCHC: 30.3 g/dL (ref 30.0–36.0)
MCV: 92.7 fL (ref 80.0–100.0)
Monocytes Absolute: 0.8 10*3/uL (ref 0.1–1.0)
Monocytes Relative: 6 %
Neutro Abs: 11.3 10*3/uL — ABNORMAL HIGH (ref 1.7–7.7)
Neutrophils Relative %: 91 %
Platelets: 406 10*3/uL — ABNORMAL HIGH (ref 150–400)
RBC: 3.13 MIL/uL — ABNORMAL LOW (ref 4.22–5.81)
RDW: 14.1 % (ref 11.5–15.5)
WBC: 12.4 10*3/uL — ABNORMAL HIGH (ref 4.0–10.5)
nRBC: 0.2 % (ref 0.0–0.2)

## 2022-11-02 LAB — COMPREHENSIVE METABOLIC PANEL
ALT: 30 U/L (ref 0–44)
AST: 22 U/L (ref 15–41)
Albumin: 2.1 g/dL — ABNORMAL LOW (ref 3.5–5.0)
Alkaline Phosphatase: 109 U/L (ref 38–126)
Anion gap: 10 (ref 5–15)
BUN: 24 mg/dL — ABNORMAL HIGH (ref 8–23)
CO2: 24 mmol/L (ref 22–32)
Calcium: 9.3 mg/dL (ref 8.9–10.3)
Chloride: 111 mmol/L (ref 98–111)
Creatinine, Ser: 0.53 mg/dL — ABNORMAL LOW (ref 0.61–1.24)
GFR, Estimated: >60 mL/min (ref >60)
Glucose, Bld: 156 mg/dL — ABNORMAL HIGH (ref 70–99)
Potassium: 3.6 mmol/L (ref 3.5–5.1)
Sodium: 145 mmol/L (ref 135–145)
Total Bilirubin: 1.1 mg/dL (ref 0.3–1.2)
Total Protein: 5.5 g/dL — ABNORMAL LOW (ref 6.5–8.1)

## 2022-11-02 LAB — GLUCOSE, CAPILLARY: Glucose-Capillary: 143 mg/dL — ABNORMAL HIGH (ref 70–99)

## 2022-11-02 LAB — APTT: aPTT: 74 s — ABNORMAL HIGH (ref 24–36)

## 2022-11-02 LAB — HEPARIN LEVEL (UNFRACTIONATED): Heparin Unfractionated: 0.58 [IU]/mL (ref 0.30–0.70)

## 2022-11-02 MED ORDER — GLYCOPYRROLATE 0.2 MG/ML IJ SOLN: 0.2000 mg | INTRAMUSCULAR | Status: DC | PRN

## 2022-11-02 MED ORDER — BIOTENE DRY MOUTH MT LIQD: 15.0000 mL | OROMUCOSAL | Status: DC | PRN

## 2022-11-02 MED ORDER — SODIUM CHLORIDE 0.9% FLUSH: 10.0000 mL | Freq: Two times a day (BID) | INTRAVENOUS | Status: DC

## 2022-11-02 MED ORDER — ONDANSETRON HCL 4 MG/2ML IJ SOLN: 4.0000 mg | Freq: Four times a day (QID) | INTRAMUSCULAR | Status: DC | PRN

## 2022-11-02 MED ORDER — LORAZEPAM 2 MG/ML PO CONC: 1.0000 mg | ORAL | Status: DC | PRN

## 2022-11-02 MED ORDER — SODIUM CHLORIDE 0.9% FLUSH: 10.0000 mL | INTRAVENOUS | Status: DC | PRN

## 2022-11-02 MED ORDER — MORPHINE 100MG IN NS 100ML (1MG/ML) PREMIX INFUSION
6.0000 mg/h | INTRAVENOUS | Status: DC
  Administered 2022-11-02: 5 mg/h via INTRAVENOUS
  Filled 2022-11-02: qty 100

## 2022-11-02 MED ORDER — ACETAMINOPHEN 325 MG PO TABS: 650.0000 mg | ORAL_TABLET | Freq: Four times a day (QID) | ORAL | Status: DC | PRN

## 2022-11-02 MED ORDER — MORPHINE BOLUS VIA INFUSION
2.0000 mg | INTRAVENOUS | Status: DC | PRN
  Administered 2022-11-02: 2 mg via INTRAVENOUS

## 2022-11-02 MED ORDER — MORPHINE SULFATE (PF) 2 MG/ML IV SOLN
INTRAVENOUS | Status: AC
  Administered 2022-11-02: 2 mg via INTRAVENOUS
  Filled 2022-11-02: qty 1

## 2022-11-02 MED ORDER — LORAZEPAM 1 MG PO TABS: 1.0000 mg | ORAL_TABLET | ORAL | Status: DC | PRN

## 2022-11-02 MED ORDER — DIPHENHYDRAMINE HCL 50 MG/ML IJ SOLN: 12.5000 mg | INTRAMUSCULAR | Status: DC | PRN

## 2022-11-02 MED ORDER — MORPHINE SULFATE (PF) 2 MG/ML IV SOLN
2.0000 mg | INTRAVENOUS | Status: DC | PRN
  Administered 2022-11-02: 2 mg via INTRAVENOUS
  Filled 2022-11-02: qty 1

## 2022-11-02 MED ORDER — HALOPERIDOL LACTATE 5 MG/ML IJ SOLN: 0.5000 mg | INTRAMUSCULAR | Status: DC | PRN

## 2022-11-02 MED ORDER — LORAZEPAM 2 MG/ML IJ SOLN
1.0000 mg | INTRAMUSCULAR | Status: DC | PRN
  Administered 2022-11-02: 1 mg via INTRAVENOUS
  Filled 2022-11-02: qty 1

## 2022-11-02 MED ORDER — POLYVINYL ALCOHOL 1.4 % OP SOLN: 1.0000 [drp] | Freq: Four times a day (QID) | OPHTHALMIC | Status: DC | PRN

## 2022-11-02 MED ORDER — ACETAMINOPHEN 650 MG RE SUPP: 650.0000 mg | Freq: Four times a day (QID) | RECTAL | Status: DC | PRN

## 2022-11-02 MED ORDER — GLYCOPYRROLATE 1 MG PO TABS: 1.0000 mg | ORAL_TABLET | ORAL | Status: DC | PRN

## 2022-11-02 MED ORDER — ONDANSETRON 4 MG PO TBDP: 4.0000 mg | ORAL_TABLET | Freq: Four times a day (QID) | ORAL | Status: DC | PRN

## 2022-11-02 MED ORDER — CHLORHEXIDINE GLUCONATE CLOTH 2 % EX PADS: 6.0000 | MEDICATED_PAD | Freq: Every day | CUTANEOUS | Status: DC

## 2022-11-02 MED ORDER — HALOPERIDOL LACTATE 2 MG/ML PO CONC: 0.5000 mg | ORAL | Status: DC | PRN

## 2022-11-02 MED ORDER — HALOPERIDOL 0.5 MG PO TABS: 0.5000 mg | ORAL_TABLET | ORAL | Status: DC | PRN

## 2022-11-03 ENCOUNTER — Ambulatory Visit: Payer: Medicare Other | Admitting: Oncology

## 2022-11-03 ENCOUNTER — Other Ambulatory Visit: Payer: Medicare Other

## 2022-11-04 ENCOUNTER — Other Ambulatory Visit (HOSPITAL_COMMUNITY): Payer: Self-pay

## 2022-11-07 ENCOUNTER — Inpatient Hospital Stay: Payer: Medicare Other | Admitting: Oncology

## 2022-11-07 ENCOUNTER — Inpatient Hospital Stay: Payer: Medicare Other

## 2022-11-22 NOTE — Consult Note (Signed)
ANTICOAGULATION CONSULT NOTE  Pharmacy Consult for Heparin Infusion Indication: PE s/p thrombectomy with acute CVA  Allergies  Allergen Reactions   Ativan [Lorazepam]    Benadryl [Diphenhydramine]     Withdraw symptoms per patient   Crestor [Rosuvastatin Calcium]    Nsaids    Rapaflo [Silodosin]    Statins Other (See Comments)   Zetia [Ezetimibe]    Latex Rash    Bandaids only   Neosporin [Neomycin-Bacitracin Zn-Polymyx] Rash    Patient Measurements: Height: 6' (182.9 cm) Weight: 78.3 kg (172 lb 9.9 oz) IBW/kg (Calculated) : 77.6 Heparin Dosing Weight: 78.3  Vital Signs: Temp: 98.6 F (37 C) (09/11 0400) Temp Source: Axillary Nov 29, 2022 0000)  Labs: Recent Labs     0000 10/30/22 0844 10/30/22 1237 10/30/22 2151 10/31/22 0445 10/31/22 0616 10/31/22 1628 11/01/22 0347 11/01/22 1221 11/01/22 2216 11-29-2022 0716  HGB   < >  --  9.4*  --  8.1*  --   --  8.0*  --   --   --   HCT  --   --  30.1*  --  26.3*  --   --  25.3*  --   --   --   PLT  --   --  363  --  354  --   --  355  --   --   --   APTT  --   --  37*   < >  --  60*   < > 70* 61* 75* 74*  HEPARINUNFRC  --   --  >1.10*   < >  --  >1.10*  --  1.06*  --   --  0.58  CREATININE  --  0.56*  --   --   --   --   --   --   --   --   --    < > = values in this interval not displayed.    Estimated Creatinine Clearance: 75.4 mL/min (A) (by C-G formula based on SCr of 0.56 mg/dL (L)).   Medical History: Past Medical History:  Diagnosis Date   Abnormal PSA    s/p post prostate biopsy in the past which was negative   Actinic keratosis 03/14/2007   L ant lat neck at base of neck - bx proven    Actinic keratosis 01/08/2014   R calf - bx proven    Alcoholism (HCC)    with prolonged hospitalization 2009 for withdrawal c/b aspiration pneumonia requiring vent   Anxiety    Atrial fibrillation and flutter (HCC)    pt denies afib.   B12 deficiency    Basal cell carcinoma 03/14/2007   R distal lat tricep near elbow     Basal cell carcinoma 03/06/2014   R calf - excision 04/22/2014   Basal cell carcinoma 08/30/2021   suprasternal area, treated with EDC   Cardiomyopathy (HCC)    mild probably multifactorial secondary to HTn and possible alcohol contribution. 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 male presenting with weakness. 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 Patient was on apixaban prior to the start of the heparin infusion. Last apixaban dose was 9/7 at 2130. Pharmacy has been consulted to initiate and manage heparin infusion.   Baseline Labs: aPTT 37, HL >1.10, Hgb 9.4, Hct 30.1, Plt 363  Goal of Therapy:  Heparin level 0.3-0.5 units/ml aPTT 66-85 seconds Monitor platelets by anticoagulation protocol: Yes  Date Time aPTT/HL Rate/Comment  9/8 2151 55/>1.10 950/aPTT subtherapeutic 9/9 0616 60/>1.10 1100/aPTT subtherapeutic 9/9 1628 65/---  1250/subtherapeutic 9/10 0347    70/1.06 1300/therapeutic  x1 9/10 1245 61/---  1300/subtherapeutic 9/10 2216 75/---  1350/therapeutic x1  9/11 0716 74/0.58 1350/aPTT therapeutic x2, HL not correlating   Plan:   No boluses given CVA Continue heparin infusion at 1350 units/hr Check aPTT and HL daily for correlation until HL and aPTT correlate. Switch to HL monitoring once HL and aPTT correlate.  Continue to monitor H&H and platelets daily while on heparin infusion    Celene Squibb, PharmD Clinical Pharmacist 11/19/2022 8:09 AM

## 2022-11-22 NOTE — Progress Notes (Signed)
Pt has increased WOB. MD at bedside. Verbal orders for mophine give. Pt given 2mg  emergently until rx verified orders.

## 2022-11-22 NOTE — Progress Notes (Signed)
SLP Cancellation Note  Patient Details Name: Wesley Young MRN: [id number] DOB: 1940/05/20   Cancelled treatment:       Reason Eval/Treat Not Completed:  (chart reviewed.)  ST order has been cancelled by MD. Pt has transitioned to full comfort care per chart notes.     Jerilynn Som, MS, CCC-SLP Speech Language Pathologist Rehab Services; [facility] [phone] (ascom) Teran Knittle 11/06/2022, 3:38 PM

## 2022-11-22 NOTE — Progress Notes (Addendum)
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  He was found to have R-sided PNA (on Cefepime) and B PE and underwent thrombectomy on Oct 28, 2022.  He was subsequently found to have innumerable cortical infarcts throughout the brain on MRI, concerning for a central embolic source; neurology is consulting.    Assessment and Plan:  B PE -Patient without prior episodes of thromboembolic disease but with known metastatic CA presenting with new B PE, RLE DVT Vascular consultation with Dr. Wyn Quaker. 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 -Neurology has been following and is currently signed off -He remains profoundly dysphagic and alternative feeding methods will have to be considered if not moving to comfort care (see below) -Speech therapy and nutrition have been consulting   History of PAF, PAC, PVC, dysrhythmia noted in cardiology Dr. Darrold Junker note 06/2022 -Patient had Holter monitor and 2022 did not show evidence of a fib. -Not on any oral anticoagulation at  home. -EKG on admission showed sinus rhythm with nonspecific STT changes -On Heparin drip at this time   Right sided pneumonia -Leukocytosis + chest x-ray shows left sided infiltrate -On Cefepime since 9/8  --   Metastatic squamous cell carcinoma lung (HCC) -Outpatient follow-up with hematology/oncology-- Dr. Cathie Hoops, who consulted on 9/9 -Pt has h/o brain radiation, also on afatinib with partial treatment response -With decline in mental status and respiratory condition, he has a poor prognosis   Essential hypertension -Losartan on hold -bp stable but becoming lower --Avoid hypotension   Chronic pain -On Tramadol at home -He is in long-term ETOH recovery and was reluctant to proceed with additional pain medication -However, he is unable to take PO and so cannot take Tramadol -Based on current clinical decline, recommend IV morphine with transition to morphine drip as needed for comfort - continuing to discuss with family   OSA on CPAP -CPAP nightly ordered   H/o GERD, Esophageal varices -PPI IV daily   Generalized weakness with debility - PT OT input appreciated.   Nutrition Status: Nutrition Problem: Moderate Malnutrition Etiology: chronic illness (cancer and cancer related treatments) Signs/Symptoms: mild fat depletion, mild muscle depletion Interventions: Ensure Enlive (each supplement provides 350kcal and 20 grams of protein), MVI, Liberalize Diet Follow RD recs  Pressure injury Pressure Injury 10/30/22 Sacrum Right;Left;Medial Stage 1 -  Intact skin with non-blanchable redness of a localized area usually over a bony prominence. (Active)  10/30/22 0900  Location: Sacrum  Location Orientation: Right;Left;Medial  Staging: Stage 1 -  Intact skin with non-blanchable redness of a localized area usually over a bony prominence.  Wound Description (Comments):   Present on  Admission:     Goals of care -Poor prognosis, appears to be actively dying -Palliative care NP met  with patient, wife and five children.  -Patient is DNR DNI.  -They opted to continue current scope of treatment previously with plan to readdress nutrition, further management regarding medical condition in a couple of days. -At this time he appears to be actively dying and this conversation needs to be today if possible. -I spoke with the wife, who reports that everyone in the family except one son is in favor of comfort only; for now they want to continue ongoing conversations.    Consultants: Vascular surgery Neurology Oncology Palliative care [facility] team PT/OT SLP Nutrition  Procedures: Mechanical thrombectomy of pulmonary emboli 9/4  Antibiotics: Cefepime 9/8-  30 Day Unplanned Readmission Risk Score    Flowsheet Row ED to Hosp-Admission (Current) from 10/24/2022 in [facility]  30 Day Unplanned Readmission Risk Score (%) 22.07 Filed at 09-Nov-2022 0801       This score is the patient's risk of an unplanned readmission within 30 days of being discharged (0 -100%). The score is based on dignosis, age, lab data, medications, orders, and past utilization.   Low:  0-14.9   Medium: 15-21.9   High: 22-29.9   Extreme: 30 and above           Subjective: Patient appears to be in extremis - respiratory distress, hypoxia in the 80s, complaining of pain.  GOC discussion held with wife at bedside and will be revisited, as he appears to be actively dying.   Objective: Vitals:   2022-11-09 1300 11-09-22 1301  BP:  108/84  Pulse:    Resp:  (!) 33  Temp:  99.7 F (37.6 C)  SpO2: 94%     Intake/Output Summary (Last 24 hours) at 11-09-22 1320 Last data filed at 09-Nov-2022 0848 Gross per 24 hour  Intake 4594.55 ml  Output 700 ml  Net 3894.55 ml   Filed Weights   10/28/2022 1301 11/01/2022 2042  Weight: 72.1 kg 78.3 kg    Exam:  General:  Appears miserable from pain and respiratory distress Eyes:   normal lids, iris ENT:  grossly normal hearing, lips &  tongue, mmm Neck:  no LAD, masses or thyromegaly Cardiovascular:  RRR, no m/r/g. No LE edema.  Respiratory:   Diffuse rhonchi, poor air movement on the R.  Increased respiratory effort.  Harsh cough. Abdomen:  soft, NT, ND Skin:  no rash or induration seen on limited exam Musculoskeletal:  grossly normal tone BUE/BLE, good ROM, no bony abnormality Psychiatric:  blunted mood and affect, speech dysarthric/aphasic Neurologic:  unable to effectively perform  Data Reviewed: I have reviewed the patient's lab results since admission.  Pertinent labs for today include:  Glucose 156 BUN 24/Creatinine 0.53/GFR >60 Albumin 2.1 WBC 12.4 Hgb 8.8 Platelets 406    Family Communication: Wife was present throughout evaluation  Disposition: Status is: Inpatient Remains inpatient appropriate because: seriously ill     Time spent: 50 minutes  Unresulted Labs (From admission, onward)     Start     Ordered   11/03/22 0500  Heparin level (unfractionated)  Tomorrow morning,   R       Question:  Specimen collection method  Answer:  Lab=Lab collect   November 09, 2022 0811   11/03/22 0500  CBC  Tomorrow morning,   R       Question:  Specimen collection method  Answer:  Lab=Lab collect   November 09, 2022  [id number]   11/03/22 0500  APTT  Tomorrow morning,   R       Question:  Specimen collection method  Answer:  Lab=Lab collect   10/30/2022 0811   11/03/22 0500  Basic metabolic panel  Tomorrow morning,   R       Question:  Specimen collection method  Answer:  Lab=Lab collect   11/15/2022 1320             Author: Jonah Blue, MD 10/28/2022 1:20 PM  For on call review [email or web address].

## 2022-11-22 NOTE — Plan of Care (Signed)

## 2022-11-22 NOTE — Plan of Care (Signed)
  Problem: Pain Managment: Goal: General experience of comfort will improve Outcome: Progressing   Problem: Safety: Goal: Ability to remain free from injury will improve Outcome: Progressing   Problem: Skin Integrity: Goal: Risk for impaired skin integrity will decrease Outcome: Progressing   Problem: Elimination: Goal: Will not experience complications related to urinary retention Outcome: Progressing   Problem: Coping: Goal: Level of anxiety will decrease Outcome: Progressing

## 2022-11-22 NOTE — Progress Notes (Signed)
Soft touch call bell given to pt. Repositioned in bed w/ tech Haylea.

## 2022-11-22 NOTE — Death Summary Note (Signed)
DEATH SUMMARY   Patient Details  Name: Wesley Young MRN: [id number] DOB: [birth year] IRJ:JOACZYSA, Wesley Amsler, MD Admission/Discharge Information   Admit Date:  October 29, 2022  Date of Death: Date of Death: 06-Nov-2022  Time of Death: Time of Death: 1648/11/09  Length of Stay: 8   Principle Cause of death: Aspiration in the setting of dysphagia post-CVA, complicated by B PE and L-sided PNA  Hospital Diagnoses: Principal Problem:   Bilateral pulmonary embolism (HCC) Active Problems:   Squamous cell carcinoma lung (HCC)   Essential hypertension   Chronic pain   OSA on CPAP   Depression with anxiety   Lung neoplasm   Metastasis to brain (HCC)   Port-A-Cath in place   Malnutrition of moderate degree   Acute CVA (cerebrovascular accident) (HCC)   Pressure injury of skin   Acute respiratory failure with hypoxia [facility])   Hospital Course: 82yo with h/o HTN, BPH, 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  He was found to have R-sided PNA (on Cefepime) and B PE and underwent thrombectomy on 9/4.  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  He continued to have progressive respiratory failure with dysphagia and ultimately family transitioned to comfort measures and he died within a couple of hours.  Assessment and Plan:  Dysphagia Patient suffered CVA and had persistent severe dysphagia, likely resulting in aspiration and asphyxiation Based on progressive respiratory distress in conjunction with overall prognosis, family agreed to transition to comfort care and he died shortly thereafter  B PE Patient without prior episodes of thromboembolic disease but with known metastatic CA presenting with new B PE, RLE DVT Vascular consultation with Dr. Wyn Young. 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 Neurology consulted He remained profoundly dysphagic  Speech therapy and nutrition have been consulting   History of PAF, PAC, PVC, dysrhythmia noted in cardiology Dr. Darrold Young note 06/2022 Patient had Holter monitor and 11/09/2020 did not show evidence of a fib. Not on any oral anticoagulation at home. EKG on admission showed sinus rhythm with nonspecific STT changes   Right sided pneumonia Leukocytosis + chest x-ray shows left sided infiltrate On Cefepime since 9/8 Still developed progressive respiratory failure and succombed  --   Metastatic squamous cell carcinoma lung (HCC) Dr. Cathie Young consulted on 9/9 Pt has h/o brain radiation, also on afatinib with partial treatment response With decline in mental status and respiratory condition, he had a poor prognosis even without the new complications       Consultants: Vascular surgery Neurology Oncology Palliative care [facility] team PT/OT SLP Nutrition   Procedures: Mechanical thrombectomy of pulmonary emboli 9/4   Antibiotics: Cefepime 9/8-11    The results of significant diagnostics from this hospitalization (including imaging, microbiology, ancillary and laboratory) are listed below for reference.   Significant  Diagnostic Studies: DG Chest Port 1 View  Result Date: 10/31/2022 CLINICAL DATA:  Cough, pulmonary emboli EXAM: PORTABLE CHEST 1 VIEW  COMPARISON:  Chest CT and chest radiograph 11/11/2022 FINDINGS: The right chest wall port is stable with the tip terminating in the mid SVC. The cardiomediastinal silhouette is grossly stable. Extensive airspace opacity throughout the left lung with an associated small effusion are not significantly changed. There is no significant airspace disease on the right. There is no right effusion. There is no pneumothorax There is no acute osseous abnormality. IMPRESSION: Unchanged extensive airspace opacity throughout the left lung with a small left pleural effusion. Electronically Signed   By: Wesley Young M.D.   On: 10/31/2022 10:20   MR BRAIN WO CONTRAST  Result Date: 10/30/2022 CLINICAL DATA:  Mental status change, unknown cause EXAM: MRI HEAD WITHOUT CONTRAST TECHNIQUE: Multiplanar, multiecho pulse sequences of the brain and surrounding structures were obtained without intravenous contrast. COMPARISON:  Brain MR 08/16/22 FINDINGS: Brain: There are innumerable predominantly cortical in the bilateral frontal, parietal, occipital lobes. Acute infarcts are also present in the right cerebellar hemisphere. Some infarcts are seen in a watershed distribution (series 5, image 96). No hemorrhage. No hydrocephalus. No extra-axial fluid collection. Sequela of mild overall microvascular ischemic change. Generalized volume loss. Assessment for the presence of intracranial metastatic disease is limited in the absence of IV contrast. Previously seen intracranial metastases in the left frontal and right occipital lobe are not definitively visualized this exam. Vascular: Normal flow voids. Skull and upper cervical spine: Normal marrow signal. Sinuses/Orbits: Small left mastoid effusion. No middle ear effusion. Paranasal sinuses are clear. Bilateral lens replacement. Orbits are otherwise unremarkable. Other: None. IMPRESSION: 1. Innumerable predominantly cortical infarcts in the bilateral frontal, parietal, occipital lobes and right  cerebellar hemisphere. Some acute infarcts are also present in the watershed distribution bilaterally. Findings are concerning for a central embolic source. 2. Assessment for the presence of intracranial metastatic disease is limited in the absence of IV contrast. Previously seen intracranial metastases in the left frontal and right occipital lobe are not definitively visualized this exam. These results will be called to the ordering clinician or representative by the Radiologist Assistant, and communication documented in the PACS or [employer]. Electronically Signed   By: Lorenza Cambridge M.D.   On: 10/30/2022 10:40   PERIPHERAL VASCULAR CATHETERIZATION  Result Date: 10/29/2022 See surgical note for result.  ECHOCARDIOGRAM COMPLETE  Result Date: 11/13/2022    ECHOCARDIOGRAM REPORT   Patient Name:   PANKAJ HOUCHEN Cassis Date of Exam: 11/18/2022 Medical Rec #:  [id number]      Height:       72.0 in Accession #:    [id number]     Weight:       159.0 lb Date of Birth:  Feb 13, 1941      BSA:          1.933 m Patient Age:    82 years       BP:           152/80 mmHg Patient Gender: M              HR:           85 bpm. Exam Location:  [facility] Procedure: 2D Echo, Cardiac Doppler and Color Doppler Indications:     I26.09 Pulmonary embolus  History:         Patient has prior history of Echocardiogram examinations, most  [address], [place], [place] [address]    Report Status 10/30/2022 FINAL  Final    Time spent: 30 minutes  Signed: Jonah Blue, MD 10/28/2022  DEATH SUMMARY   Patient Details  Name: Wesley Young MRN: [id number] DOB: [birth year] IRJ:JOACZYSA, Wesley Amsler, MD Admission/Discharge Information   Admit Date:  October 29, 2022  Date of Death: Date of Death: 06-Nov-2022  Time of Death: Time of Death: 1648/11/09  Length of Stay: 8   Principle Cause of death: Aspiration in the setting of dysphagia post-CVA, complicated by B PE and L-sided PNA  Hospital Diagnoses: Principal Problem:   Bilateral pulmonary embolism (HCC) Active Problems:   Squamous cell carcinoma lung (HCC)   Essential hypertension   Chronic pain   OSA on CPAP   Depression with anxiety   Lung neoplasm   Metastasis to brain (HCC)   Port-A-Cath in place   Malnutrition of moderate degree   Acute CVA (cerebrovascular accident) (HCC)   Pressure injury of skin   Acute respiratory failure with hypoxia [facility])   Hospital Course: 82yo with h/o HTN, BPH, 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  He was found to have R-sided PNA (on Cefepime) and B PE and underwent thrombectomy on 9/4.  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  He continued to have progressive respiratory failure with dysphagia and ultimately family transitioned to comfort measures and he died within a couple of hours.  Assessment and Plan:  Dysphagia Patient suffered CVA and had persistent severe dysphagia, likely resulting in aspiration and asphyxiation Based on progressive respiratory distress in conjunction with overall prognosis, family agreed to transition to comfort care and he died shortly thereafter  B PE Patient without prior episodes of thromboembolic disease but with known metastatic CA presenting with new B PE, RLE DVT Vascular consultation with Dr. Wyn Young. 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 Neurology consulted He remained profoundly dysphagic  Speech therapy and nutrition have been consulting   History of PAF, PAC, PVC, dysrhythmia noted in cardiology Dr. Darrold Young note 06/2022 Patient had Holter monitor and 11/09/2020 did not show evidence of a fib. Not on any oral anticoagulation at home. EKG on admission showed sinus rhythm with nonspecific STT changes   Right sided pneumonia Leukocytosis + chest x-ray shows left sided infiltrate On Cefepime since 9/8 Still developed progressive respiratory failure and succombed  --   Metastatic squamous cell carcinoma lung (HCC) Dr. Cathie Young consulted on 9/9 Pt has h/o brain radiation, also on afatinib with partial treatment response With decline in mental status and respiratory condition, he had a poor prognosis even without the new complications       Consultants: Vascular surgery Neurology Oncology Palliative care [facility] team PT/OT SLP Nutrition   Procedures: Mechanical thrombectomy of pulmonary emboli 9/4   Antibiotics: Cefepime 9/8-11    The results of significant diagnostics from this hospitalization (including imaging, microbiology, ancillary and laboratory) are listed below for reference.   Significant  DEATH SUMMARY   Patient Details  Name: Wesley Young MRN: [id number] DOB: [birth year] IRJ:JOACZYSA, Wesley Amsler, MD Admission/Discharge Information   Admit Date:  October 29, 2022  Date of Death: Date of Death: 06-Nov-2022  Time of Death: Time of Death: 1648/11/09  Length of Stay: 8   Principle Cause of death: Aspiration in the setting of dysphagia post-CVA, complicated by B PE and L-sided PNA  Hospital Diagnoses: Principal Problem:   Bilateral pulmonary embolism (HCC) Active Problems:   Squamous cell carcinoma lung (HCC)   Essential hypertension   Chronic pain   OSA on CPAP   Depression with anxiety   Lung neoplasm   Metastasis to brain (HCC)   Port-A-Cath in place   Malnutrition of moderate degree   Acute CVA (cerebrovascular accident) (HCC)   Pressure injury of skin   Acute respiratory failure with hypoxia [facility])   Hospital Course: 82yo with h/o HTN, BPH, 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  He was found to have R-sided PNA (on Cefepime) and B PE and underwent thrombectomy on 9/4.  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  He continued to have progressive respiratory failure with dysphagia and ultimately family transitioned to comfort measures and he died within a couple of hours.  Assessment and Plan:  Dysphagia Patient suffered CVA and had persistent severe dysphagia, likely resulting in aspiration and asphyxiation Based on progressive respiratory distress in conjunction with overall prognosis, family agreed to transition to comfort care and he died shortly thereafter  B PE Patient without prior episodes of thromboembolic disease but with known metastatic CA presenting with new B PE, RLE DVT Vascular consultation with Dr. Wyn Young. 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 Neurology consulted He remained profoundly dysphagic  Speech therapy and nutrition have been consulting   History of PAF, PAC, PVC, dysrhythmia noted in cardiology Dr. Darrold Young note 06/2022 Patient had Holter monitor and 11/09/2020 did not show evidence of a fib. Not on any oral anticoagulation at home. EKG on admission showed sinus rhythm with nonspecific STT changes   Right sided pneumonia Leukocytosis + chest x-ray shows left sided infiltrate On Cefepime since 9/8 Still developed progressive respiratory failure and succombed  --   Metastatic squamous cell carcinoma lung (HCC) Dr. Cathie Young consulted on 9/9 Pt has h/o brain radiation, also on afatinib with partial treatment response With decline in mental status and respiratory condition, he had a poor prognosis even without the new complications       Consultants: Vascular surgery Neurology Oncology Palliative care [facility] team PT/OT SLP Nutrition   Procedures: Mechanical thrombectomy of pulmonary emboli 9/4   Antibiotics: Cefepime 9/8-11    The results of significant diagnostics from this hospitalization (including imaging, microbiology, ancillary and laboratory) are listed below for reference.   Significant  Diagnostic Studies: DG Chest Port 1 View  Result Date: 10/31/2022 CLINICAL DATA:  Cough, pulmonary emboli EXAM: PORTABLE CHEST 1 VIEW  COMPARISON:  Chest CT and chest radiograph 11/11/2022 FINDINGS: The right chest wall port is stable with the tip terminating in the mid SVC. The cardiomediastinal silhouette is grossly stable. Extensive airspace opacity throughout the left lung with an associated small effusion are not significantly changed. There is no significant airspace disease on the right. There is no right effusion. There is no pneumothorax There is no acute osseous abnormality. IMPRESSION: Unchanged extensive airspace opacity throughout the left lung with a small left pleural effusion. Electronically Signed   By: Wesley Young M.D.   On: 10/31/2022 10:20   MR BRAIN WO CONTRAST  Result Date: 10/30/2022 CLINICAL DATA:  Mental status change, unknown cause EXAM: MRI HEAD WITHOUT CONTRAST TECHNIQUE: Multiplanar, multiecho pulse sequences of the brain and surrounding structures were obtained without intravenous contrast. COMPARISON:  Brain MR 08/16/22 FINDINGS: Brain: There are innumerable predominantly cortical in the bilateral frontal, parietal, occipital lobes. Acute infarcts are also present in the right cerebellar hemisphere. Some infarcts are seen in a watershed distribution (series 5, image 96). No hemorrhage. No hydrocephalus. No extra-axial fluid collection. Sequela of mild overall microvascular ischemic change. Generalized volume loss. Assessment for the presence of intracranial metastatic disease is limited in the absence of IV contrast. Previously seen intracranial metastases in the left frontal and right occipital lobe are not definitively visualized this exam. Vascular: Normal flow voids. Skull and upper cervical spine: Normal marrow signal. Sinuses/Orbits: Small left mastoid effusion. No middle ear effusion. Paranasal sinuses are clear. Bilateral lens replacement. Orbits are otherwise unremarkable. Other: None. IMPRESSION: 1. Innumerable predominantly cortical infarcts in the bilateral frontal, parietal, occipital lobes and right  cerebellar hemisphere. Some acute infarcts are also present in the watershed distribution bilaterally. Findings are concerning for a central embolic source. 2. Assessment for the presence of intracranial metastatic disease is limited in the absence of IV contrast. Previously seen intracranial metastases in the left frontal and right occipital lobe are not definitively visualized this exam. These results will be called to the ordering clinician or representative by the Radiologist Assistant, and communication documented in the PACS or [employer]. Electronically Signed   By: Lorenza Cambridge M.D.   On: 10/30/2022 10:40   PERIPHERAL VASCULAR CATHETERIZATION  Result Date: 10/29/2022 See surgical note for result.  ECHOCARDIOGRAM COMPLETE  Result Date: 11/13/2022    ECHOCARDIOGRAM REPORT   Patient Name:   PANKAJ HOUCHEN Cassis Date of Exam: 11/18/2022 Medical Rec #:  [id number]      Height:       72.0 in Accession #:    [id number]     Weight:       159.0 lb Date of Birth:  Feb 13, 1941      BSA:          1.933 m Patient Age:    82 years       BP:           152/80 mmHg Patient Gender: M              HR:           85 bpm. Exam Location:  [facility] Procedure: 2D Echo, Cardiac Doppler and Color Doppler Indications:     I26.09 Pulmonary embolus  History:         Patient has prior history of Echocardiogram examinations, most

## 2022-11-22 NOTE — Progress Notes (Signed)
Progress Note   Patient: Wesley Young YSA:[id number] DOB: 1940-11-25 DOA: 2022-10-30     8 DOS: the patient was seen and examined on 11/15/2022   He is continuing to struggle and is clearly in distress.  Now on [place] O2 and this is bothersome to him.  Discussion with wife, 3 acute children at the bedside.  All family is in agreement with transition to full comfort measures.  Will initiate morphine drip.  Palliative care consult is pending.   Author: Jonah Blue, MD 11/16/2022 3:04 PM  For on call review [email or web address].

## 2022-11-22 NOTE — Progress Notes (Signed)
SLP Cancellation Note  Patient Details Name: Wesley Young MRN: [id number] DOB: February 01, 1941   Cancelled treatment:       Reason Eval/Treat Not Completed: Medical issues which prohibited therapy;Patient not medically ready (chart reviewed; consulted NSG then met w/ pt/Wife in room.)  ST services is continuing to follow to attempt another f/u for BSE. During this admit, he has not shown appropriate alertness or a stable physiological presentation upon previous attempts.  This morning, pt was awake and able to make few basic wants/needs known w/ a Y/N acknowledgement -- more phonation response. HOWEVER, upon further observation/assessment, his RR was ~34-38x min w/ heavy mouth breathing.  Though the Wife described similar breathing presentation at home, this respiratory presentation is NOT conducive for safe oral intake. At this time, it would not be safe to recommend attempts at po trials nor an oral diet/po meds w/ this respiratory presentation. Oncology note re: his Baseline Pulmonary status/function(poor at baseline) was reviewed.   Updated the Wife re: my assessment of his breathing, its impact on oropharyngeal phase swallowing, and his HIGH risk for aspiration of oral intake. No oral intake recommended at this time. Wife agreed. OF NOTE: Wife stated, "I wasn't sure why the doctor was going to have you come by".   Recommend ongoing f/u w/ Palliative Care for GOC including discussion of alternative means of feeding moving forward if appropriate/desired d/t HIGH risk for aspiration.  MD and NSG updated.     Jerilynn Som, MS, CCC-SLP Speech Language Pathologist Rehab Services; [facility] [phone] (ascom) Tymarion Everard 11/01/2022, 12:12 PM

## 2022-11-22 NOTE — Progress Notes (Signed)
PT Cancellation Note  Patient Details Name: DEMOND TAGUCHI MRN: [id number] DOB: 1940/07/25   Cancelled Treatment:    Reason Eval/Treat Not Completed: Other (comment) (PT orders completed at this time, PT to sign off.)   Olga Coaster PT, DPT 3:19 PM,10/24/2022

## 2022-11-22 DEATH — deceased

## 2022-11-23 ENCOUNTER — Ambulatory Visit: Payer: Medicare Other | Admitting: Radiation Oncology

## 2022-11-28 ENCOUNTER — Ambulatory Visit: Payer: Medicare Other | Admitting: Radiation Oncology

## 2022-11-30 ENCOUNTER — Ambulatory Visit: Payer: Medicare Other | Admitting: Dermatology

## 2023-01-09 ENCOUNTER — Telehealth: Payer: Medicare Other | Admitting: Hospice and Palliative Medicine
